# Patient Record
Sex: Female | Born: 1945 | Race: Black or African American | Hispanic: No | Marital: Single | Smoking: Former smoker
Health system: Southern US, Community
[De-identification: ages and names within clinical notes are randomized; demographics above are authoritative.]

## PROBLEM LIST (undated history)

## (undated) DIAGNOSIS — R0600 Dyspnea, unspecified: Secondary | ICD-10-CM

## (undated) DIAGNOSIS — M1990 Unspecified osteoarthritis, unspecified site: Secondary | ICD-10-CM

## (undated) DIAGNOSIS — I10 Essential (primary) hypertension: Secondary | ICD-10-CM

## (undated) DIAGNOSIS — F329 Major depressive disorder, single episode, unspecified: Secondary | ICD-10-CM

## (undated) DIAGNOSIS — M4310 Spondylolisthesis, site unspecified: Secondary | ICD-10-CM

## (undated) DIAGNOSIS — E7800 Pure hypercholesterolemia, unspecified: Secondary | ICD-10-CM

## (undated) DIAGNOSIS — C50919 Malignant neoplasm of unspecified site of unspecified female breast: Secondary | ICD-10-CM

## (undated) DIAGNOSIS — R55 Syncope and collapse: Secondary | ICD-10-CM

## (undated) DIAGNOSIS — I480 Paroxysmal atrial fibrillation: Secondary | ICD-10-CM

## (undated) DIAGNOSIS — K449 Diaphragmatic hernia without obstruction or gangrene: Secondary | ICD-10-CM

## (undated) DIAGNOSIS — R51 Headache: Secondary | ICD-10-CM

## (undated) DIAGNOSIS — N189 Chronic kidney disease, unspecified: Secondary | ICD-10-CM

## (undated) DIAGNOSIS — K209 Esophagitis, unspecified without bleeding: Secondary | ICD-10-CM

## (undated) DIAGNOSIS — K5730 Diverticulosis of large intestine without perforation or abscess without bleeding: Secondary | ICD-10-CM

## (undated) DIAGNOSIS — I499 Cardiac arrhythmia, unspecified: Secondary | ICD-10-CM

## (undated) DIAGNOSIS — R519 Headache, unspecified: Secondary | ICD-10-CM

## (undated) DIAGNOSIS — F32A Depression, unspecified: Secondary | ICD-10-CM

## (undated) DIAGNOSIS — I219 Acute myocardial infarction, unspecified: Secondary | ICD-10-CM

## (undated) DIAGNOSIS — E669 Obesity, unspecified: Secondary | ICD-10-CM

## (undated) DIAGNOSIS — K219 Gastro-esophageal reflux disease without esophagitis: Secondary | ICD-10-CM

## (undated) DIAGNOSIS — G4733 Obstructive sleep apnea (adult) (pediatric): Secondary | ICD-10-CM

## (undated) DIAGNOSIS — D649 Anemia, unspecified: Secondary | ICD-10-CM

## (undated) DIAGNOSIS — K625 Hemorrhage of anus and rectum: Secondary | ICD-10-CM

## (undated) DIAGNOSIS — M19049 Primary osteoarthritis, unspecified hand: Secondary | ICD-10-CM

## (undated) DIAGNOSIS — K648 Other hemorrhoids: Secondary | ICD-10-CM

## (undated) DIAGNOSIS — D126 Benign neoplasm of colon, unspecified: Secondary | ICD-10-CM

## (undated) DIAGNOSIS — M47816 Spondylosis without myelopathy or radiculopathy, lumbar region: Secondary | ICD-10-CM

## (undated) HISTORY — DX: Spondylolisthesis, site unspecified: M43.10

## (undated) HISTORY — PX: TONSILLECTOMY: SUR1361

## (undated) HISTORY — PX: DILATION AND CURETTAGE OF UTERUS: SHX78

## (undated) HISTORY — PX: TOE SURGERY: SHX1073

## (undated) HISTORY — PX: COLONOSCOPY: SHX174

## (undated) HISTORY — PX: LOOP RECORDER IMPLANT: SHX5954

## (undated) HISTORY — DX: Esophagitis, unspecified without bleeding: K20.90

## (undated) HISTORY — DX: Unspecified osteoarthritis, unspecified site: M19.90

## (undated) HISTORY — DX: Pure hypercholesterolemia, unspecified: E78.00

## (undated) HISTORY — DX: Anemia, unspecified: D64.9

## (undated) HISTORY — DX: Benign neoplasm of colon, unspecified: D12.6

## (undated) HISTORY — DX: Other hemorrhoids: K64.8

## (undated) HISTORY — DX: Major depressive disorder, single episode, unspecified: F32.9

## (undated) HISTORY — DX: Syncope and collapse: R55

## (undated) HISTORY — DX: Diverticulosis of large intestine without perforation or abscess without bleeding: K57.30

## (undated) HISTORY — DX: Essential (primary) hypertension: I10

## (undated) HISTORY — DX: Obstructive sleep apnea (adult) (pediatric): G47.33

## (undated) HISTORY — DX: Malignant neoplasm of unspecified site of unspecified female breast: C50.919

## (undated) HISTORY — DX: Spondylosis without myelopathy or radiculopathy, lumbar region: M47.816

## (undated) HISTORY — PX: JOINT REPLACEMENT: SHX530

## (undated) HISTORY — DX: Paroxysmal atrial fibrillation: I48.0

## (undated) HISTORY — DX: Obesity, unspecified: E66.9

## (undated) HISTORY — DX: Primary osteoarthritis, unspecified hand: M19.049

## (undated) HISTORY — PX: BACK SURGERY: SHX140

## (undated) HISTORY — DX: Hemorrhage of anus and rectum: K62.5

## (undated) HISTORY — DX: Depression, unspecified: F32.A

## (undated) HISTORY — PX: CHOLECYSTECTOMY: SHX55

## (undated) HISTORY — PX: FOOT SURGERY: SHX648

## (undated) HISTORY — DX: Esophagitis, unspecified: K20.9

---

## 1976-01-19 HISTORY — PX: ABDOMINAL HYSTERECTOMY: SHX81

## 1987-01-19 DIAGNOSIS — C50919 Malignant neoplasm of unspecified site of unspecified female breast: Secondary | ICD-10-CM

## 1987-01-19 HISTORY — DX: Malignant neoplasm of unspecified site of unspecified female breast: C50.919

## 1993-01-18 HISTORY — PX: BREAST BIOPSY: SHX20

## 1995-01-19 HISTORY — PX: BREAST EXCISIONAL BIOPSY: SUR124

## 1997-05-01 ENCOUNTER — Ambulatory Visit (HOSPITAL_COMMUNITY): Admission: RE | Admit: 1997-05-01 | Discharge: 1997-05-01 | Payer: Self-pay | Admitting: Cardiology

## 1997-05-26 ENCOUNTER — Inpatient Hospital Stay (HOSPITAL_COMMUNITY): Admission: EM | Admit: 1997-05-26 | Discharge: 1997-05-28 | Payer: Self-pay | Admitting: *Deleted

## 1997-06-26 ENCOUNTER — Other Ambulatory Visit: Admission: RE | Admit: 1997-06-26 | Discharge: 1997-06-26 | Payer: Self-pay | Admitting: Obstetrics

## 1998-02-08 ENCOUNTER — Encounter: Payer: Self-pay | Admitting: Emergency Medicine

## 1998-02-08 ENCOUNTER — Inpatient Hospital Stay (HOSPITAL_COMMUNITY): Admission: EM | Admit: 1998-02-08 | Discharge: 1998-02-14 | Payer: Self-pay | Admitting: Emergency Medicine

## 1998-02-12 ENCOUNTER — Encounter: Payer: Self-pay | Admitting: Cardiology

## 1998-12-16 ENCOUNTER — Encounter: Payer: Self-pay | Admitting: Cardiology

## 1998-12-16 ENCOUNTER — Ambulatory Visit (HOSPITAL_COMMUNITY): Admission: RE | Admit: 1998-12-16 | Discharge: 1998-12-16 | Payer: Self-pay | Admitting: Cardiology

## 1999-04-29 ENCOUNTER — Encounter: Admission: RE | Admit: 1999-04-29 | Discharge: 1999-04-29 | Payer: Self-pay | Admitting: Cardiology

## 1999-04-29 ENCOUNTER — Encounter: Payer: Self-pay | Admitting: Cardiology

## 1999-10-05 ENCOUNTER — Encounter: Payer: Self-pay | Admitting: Cardiology

## 1999-10-05 ENCOUNTER — Encounter: Admission: RE | Admit: 1999-10-05 | Discharge: 1999-10-05 | Payer: Self-pay | Admitting: Cardiology

## 2000-01-19 HISTORY — PX: KNEE ARTHROSCOPY: SUR90

## 2000-03-23 ENCOUNTER — Ambulatory Visit (HOSPITAL_COMMUNITY): Admission: RE | Admit: 2000-03-23 | Discharge: 2000-03-23 | Payer: Self-pay | Admitting: Orthopaedic Surgery

## 2000-04-08 ENCOUNTER — Ambulatory Visit (HOSPITAL_COMMUNITY): Admission: RE | Admit: 2000-04-08 | Discharge: 2000-04-08 | Payer: Self-pay | Admitting: Orthopaedic Surgery

## 2000-04-22 ENCOUNTER — Encounter: Admission: RE | Admit: 2000-04-22 | Discharge: 2000-04-22 | Payer: Self-pay | Admitting: Orthopaedic Surgery

## 2000-10-11 ENCOUNTER — Encounter: Admission: RE | Admit: 2000-10-11 | Discharge: 2000-10-11 | Payer: Self-pay | Admitting: Cardiology

## 2000-10-11 ENCOUNTER — Encounter: Payer: Self-pay | Admitting: Cardiology

## 2000-10-27 ENCOUNTER — Other Ambulatory Visit: Admission: RE | Admit: 2000-10-27 | Discharge: 2000-10-27 | Payer: Self-pay | Admitting: Obstetrics and Gynecology

## 2000-11-14 ENCOUNTER — Inpatient Hospital Stay (HOSPITAL_COMMUNITY): Admission: RE | Admit: 2000-11-14 | Discharge: 2000-11-17 | Payer: Self-pay | Admitting: Orthopaedic Surgery

## 2000-11-17 ENCOUNTER — Inpatient Hospital Stay (HOSPITAL_COMMUNITY)
Admission: RE | Admit: 2000-11-17 | Discharge: 2000-11-23 | Payer: Self-pay | Admitting: Physical Medicine & Rehabilitation

## 2001-02-13 ENCOUNTER — Encounter: Admission: RE | Admit: 2001-02-13 | Discharge: 2001-05-14 | Payer: Self-pay | Admitting: Orthopaedic Surgery

## 2001-05-15 ENCOUNTER — Encounter: Admission: RE | Admit: 2001-05-15 | Discharge: 2001-08-13 | Payer: Self-pay | Admitting: Orthopaedic Surgery

## 2001-05-30 ENCOUNTER — Ambulatory Visit (HOSPITAL_BASED_OUTPATIENT_CLINIC_OR_DEPARTMENT_OTHER): Admission: RE | Admit: 2001-05-30 | Discharge: 2001-05-30 | Payer: Self-pay | Admitting: Urology

## 2001-10-26 ENCOUNTER — Encounter: Admission: RE | Admit: 2001-10-26 | Discharge: 2001-10-26 | Payer: Self-pay | Admitting: Cardiology

## 2001-10-26 ENCOUNTER — Encounter: Payer: Self-pay | Admitting: Cardiology

## 2001-11-03 ENCOUNTER — Encounter: Payer: Self-pay | Admitting: Cardiology

## 2001-11-03 ENCOUNTER — Encounter: Admission: RE | Admit: 2001-11-03 | Discharge: 2001-11-03 | Payer: Self-pay | Admitting: Cardiology

## 2002-11-06 ENCOUNTER — Encounter: Payer: Self-pay | Admitting: Cardiology

## 2002-11-06 ENCOUNTER — Encounter: Admission: RE | Admit: 2002-11-06 | Discharge: 2002-11-06 | Payer: Self-pay | Admitting: Cardiology

## 2003-05-31 ENCOUNTER — Ambulatory Visit (HOSPITAL_COMMUNITY): Admission: RE | Admit: 2003-05-31 | Discharge: 2003-05-31 | Payer: Self-pay | Admitting: Orthopaedic Surgery

## 2003-06-04 ENCOUNTER — Emergency Department (HOSPITAL_COMMUNITY): Admission: EM | Admit: 2003-06-04 | Discharge: 2003-06-05 | Payer: Self-pay | Admitting: *Deleted

## 2003-10-08 ENCOUNTER — Encounter (INDEPENDENT_AMBULATORY_CARE_PROVIDER_SITE_OTHER): Payer: Self-pay | Admitting: *Deleted

## 2003-10-08 ENCOUNTER — Ambulatory Visit (HOSPITAL_COMMUNITY): Admission: RE | Admit: 2003-10-08 | Discharge: 2003-10-08 | Payer: Self-pay | Admitting: Gastroenterology

## 2003-11-18 ENCOUNTER — Encounter: Admission: RE | Admit: 2003-11-18 | Discharge: 2003-11-18 | Payer: Self-pay | Admitting: Cardiology

## 2004-03-04 ENCOUNTER — Encounter (INDEPENDENT_AMBULATORY_CARE_PROVIDER_SITE_OTHER): Payer: Self-pay | Admitting: Specialist

## 2004-03-04 ENCOUNTER — Ambulatory Visit (HOSPITAL_COMMUNITY): Admission: RE | Admit: 2004-03-04 | Discharge: 2004-03-04 | Payer: Self-pay | Admitting: Gastroenterology

## 2004-05-02 ENCOUNTER — Emergency Department (HOSPITAL_COMMUNITY): Admission: EM | Admit: 2004-05-02 | Discharge: 2004-05-02 | Payer: Self-pay | Admitting: Family Medicine

## 2004-05-19 ENCOUNTER — Ambulatory Visit (HOSPITAL_COMMUNITY): Admission: RE | Admit: 2004-05-19 | Discharge: 2004-05-19 | Payer: Self-pay | Admitting: Cardiology

## 2004-07-16 ENCOUNTER — Ambulatory Visit (HOSPITAL_COMMUNITY): Admission: RE | Admit: 2004-07-16 | Discharge: 2004-07-16 | Payer: Self-pay | Admitting: General Surgery

## 2004-07-16 ENCOUNTER — Encounter (INDEPENDENT_AMBULATORY_CARE_PROVIDER_SITE_OTHER): Payer: Self-pay | Admitting: *Deleted

## 2004-10-29 ENCOUNTER — Encounter: Admission: RE | Admit: 2004-10-29 | Discharge: 2004-10-29 | Payer: Self-pay | Admitting: Otolaryngology

## 2004-12-09 ENCOUNTER — Encounter: Admission: RE | Admit: 2004-12-09 | Discharge: 2004-12-09 | Payer: Self-pay | Admitting: Cardiology

## 2005-01-14 ENCOUNTER — Encounter: Admission: RE | Admit: 2005-01-14 | Discharge: 2005-01-14 | Payer: Self-pay | Admitting: Cardiology

## 2005-10-13 ENCOUNTER — Inpatient Hospital Stay (HOSPITAL_COMMUNITY): Admission: EM | Admit: 2005-10-13 | Discharge: 2005-10-17 | Payer: Self-pay | Admitting: Emergency Medicine

## 2005-12-13 ENCOUNTER — Encounter: Admission: RE | Admit: 2005-12-13 | Discharge: 2005-12-13 | Payer: Self-pay | Admitting: Cardiology

## 2006-02-13 ENCOUNTER — Inpatient Hospital Stay (HOSPITAL_COMMUNITY): Admission: EM | Admit: 2006-02-13 | Discharge: 2006-02-16 | Payer: Self-pay | Admitting: Emergency Medicine

## 2006-06-19 ENCOUNTER — Inpatient Hospital Stay (HOSPITAL_COMMUNITY): Admission: EM | Admit: 2006-06-19 | Discharge: 2006-06-22 | Payer: Self-pay | Admitting: Emergency Medicine

## 2006-08-03 ENCOUNTER — Emergency Department (HOSPITAL_COMMUNITY): Admission: EM | Admit: 2006-08-03 | Discharge: 2006-08-03 | Payer: Self-pay | Admitting: Emergency Medicine

## 2006-09-12 ENCOUNTER — Ambulatory Visit (HOSPITAL_COMMUNITY): Admission: RE | Admit: 2006-09-12 | Discharge: 2006-09-12 | Payer: Self-pay | Admitting: Cardiology

## 2006-11-13 ENCOUNTER — Ambulatory Visit: Payer: Self-pay | Admitting: Surgery

## 2006-11-13 ENCOUNTER — Observation Stay (HOSPITAL_COMMUNITY): Admission: EM | Admit: 2006-11-13 | Discharge: 2006-11-17 | Payer: Self-pay | Admitting: Emergency Medicine

## 2006-11-13 ENCOUNTER — Ambulatory Visit: Payer: Self-pay | Admitting: Internal Medicine

## 2006-11-16 ENCOUNTER — Encounter (INDEPENDENT_AMBULATORY_CARE_PROVIDER_SITE_OTHER): Payer: Self-pay | Admitting: Cardiology

## 2006-11-28 ENCOUNTER — Ambulatory Visit: Payer: Self-pay

## 2006-12-20 ENCOUNTER — Encounter: Admission: RE | Admit: 2006-12-20 | Discharge: 2006-12-20 | Payer: Self-pay | Admitting: Cardiology

## 2007-02-13 ENCOUNTER — Inpatient Hospital Stay (HOSPITAL_COMMUNITY): Admission: EM | Admit: 2007-02-13 | Discharge: 2007-02-18 | Payer: Self-pay | Admitting: Emergency Medicine

## 2007-02-13 ENCOUNTER — Ambulatory Visit: Payer: Self-pay | Admitting: Internal Medicine

## 2007-02-15 HISTORY — PX: ESOPHAGOGASTRODUODENOSCOPY: SHX1529

## 2007-03-06 ENCOUNTER — Encounter: Admission: RE | Admit: 2007-03-06 | Discharge: 2007-03-30 | Payer: Self-pay | Admitting: Cardiology

## 2007-03-10 ENCOUNTER — Encounter: Admission: RE | Admit: 2007-03-10 | Discharge: 2007-03-10 | Payer: Self-pay | Admitting: Gastroenterology

## 2007-03-14 ENCOUNTER — Ambulatory Visit: Payer: Self-pay | Admitting: Internal Medicine

## 2007-08-11 ENCOUNTER — Ambulatory Visit (HOSPITAL_COMMUNITY): Admission: RE | Admit: 2007-08-11 | Discharge: 2007-08-11 | Payer: Self-pay | Admitting: Orthopaedic Surgery

## 2007-09-12 ENCOUNTER — Ambulatory Visit: Payer: Self-pay | Admitting: Internal Medicine

## 2007-12-11 ENCOUNTER — Ambulatory Visit: Payer: Self-pay

## 2007-12-22 ENCOUNTER — Encounter: Admission: RE | Admit: 2007-12-22 | Discharge: 2007-12-22 | Payer: Self-pay | Admitting: Cardiology

## 2008-03-15 ENCOUNTER — Observation Stay (HOSPITAL_COMMUNITY): Admission: EM | Admit: 2008-03-15 | Discharge: 2008-03-16 | Payer: Self-pay | Admitting: Emergency Medicine

## 2008-05-16 ENCOUNTER — Encounter (HOSPITAL_COMMUNITY): Admission: RE | Admit: 2008-05-16 | Discharge: 2008-08-14 | Payer: Self-pay | Admitting: Cardiology

## 2008-06-18 ENCOUNTER — Emergency Department (HOSPITAL_COMMUNITY): Admission: EM | Admit: 2008-06-18 | Discharge: 2008-06-18 | Payer: Self-pay | Admitting: Emergency Medicine

## 2008-08-02 ENCOUNTER — Encounter (HOSPITAL_COMMUNITY): Admission: RE | Admit: 2008-08-02 | Discharge: 2008-09-19 | Payer: Self-pay | Admitting: Cardiology

## 2008-11-12 ENCOUNTER — Ambulatory Visit: Payer: Self-pay | Admitting: Internal Medicine

## 2008-11-12 DIAGNOSIS — R55 Syncope and collapse: Secondary | ICD-10-CM | POA: Insufficient documentation

## 2008-11-12 DIAGNOSIS — I10 Essential (primary) hypertension: Secondary | ICD-10-CM

## 2008-11-12 DIAGNOSIS — E785 Hyperlipidemia, unspecified: Secondary | ICD-10-CM

## 2008-12-07 ENCOUNTER — Observation Stay (HOSPITAL_COMMUNITY): Admission: EM | Admit: 2008-12-07 | Discharge: 2008-12-09 | Payer: Self-pay | Admitting: Emergency Medicine

## 2008-12-23 ENCOUNTER — Telehealth: Payer: Self-pay | Admitting: Internal Medicine

## 2008-12-23 ENCOUNTER — Encounter: Payer: Self-pay | Admitting: Internal Medicine

## 2008-12-23 ENCOUNTER — Encounter: Admission: RE | Admit: 2008-12-23 | Discharge: 2008-12-23 | Payer: Self-pay | Admitting: Cardiology

## 2009-01-18 HISTORY — PX: HEMORRHOID SURGERY: SHX153

## 2009-01-30 ENCOUNTER — Ambulatory Visit: Payer: Self-pay

## 2009-04-14 ENCOUNTER — Emergency Department (HOSPITAL_COMMUNITY): Admission: EM | Admit: 2009-04-14 | Discharge: 2009-04-14 | Payer: Self-pay | Admitting: Emergency Medicine

## 2009-04-30 ENCOUNTER — Encounter: Payer: Self-pay | Admitting: Internal Medicine

## 2009-04-30 ENCOUNTER — Ambulatory Visit: Payer: Self-pay | Admitting: Internal Medicine

## 2009-05-07 ENCOUNTER — Encounter: Payer: Self-pay | Admitting: Internal Medicine

## 2009-07-08 ENCOUNTER — Ambulatory Visit (HOSPITAL_COMMUNITY): Admission: RE | Admit: 2009-07-08 | Discharge: 2009-07-09 | Payer: Self-pay | Admitting: General Surgery

## 2009-07-08 ENCOUNTER — Encounter (INDEPENDENT_AMBULATORY_CARE_PROVIDER_SITE_OTHER): Payer: Self-pay | Admitting: General Surgery

## 2009-08-19 ENCOUNTER — Ambulatory Visit: Payer: Self-pay | Admitting: Internal Medicine

## 2009-09-29 ENCOUNTER — Encounter: Admission: RE | Admit: 2009-09-29 | Discharge: 2009-09-29 | Payer: Self-pay | Admitting: Neurology

## 2009-10-30 ENCOUNTER — Ambulatory Visit: Payer: Self-pay | Admitting: Internal Medicine

## 2009-11-09 DIAGNOSIS — D126 Benign neoplasm of colon, unspecified: Secondary | ICD-10-CM

## 2009-11-09 HISTORY — PX: COLONOSCOPY W/ POLYPECTOMY: SHX1380

## 2009-11-09 HISTORY — DX: Benign neoplasm of colon, unspecified: D12.6

## 2010-01-15 ENCOUNTER — Encounter
Admission: RE | Admit: 2010-01-15 | Discharge: 2010-01-15 | Payer: Self-pay | Source: Home / Self Care | Attending: Cardiology | Admitting: Cardiology

## 2010-02-03 ENCOUNTER — Ambulatory Visit
Admission: RE | Admit: 2010-02-03 | Discharge: 2010-02-03 | Payer: Self-pay | Source: Home / Self Care | Attending: Internal Medicine | Admitting: Internal Medicine

## 2010-02-08 ENCOUNTER — Encounter: Payer: Self-pay | Admitting: Neurology

## 2010-02-08 ENCOUNTER — Encounter: Payer: Self-pay | Admitting: Internal Medicine

## 2010-02-09 ENCOUNTER — Encounter: Payer: Self-pay | Admitting: Cardiology

## 2010-02-17 NOTE — Cardiovascular Report (Signed)
Summary: Office Visit Remote  Office Visit Remote   Imported By: Roderic Ovens 05/07/2009 14:20:28  _____________________________________________________________________  External Attachment:    Type:   Image     Comment:   External Document

## 2010-02-17 NOTE — Letter (Signed)
Summary: Remote Device Check  [employer], Main Office  [address]   [place], [place] [address]   Phone: [phone]  Fax: [phone]     May 07, 2009 MRN: [id number]   [facility] [address] [place], [place]  [address]   Dear Ms. Kotlyar,   Your remote transmission was recieved and reviewed by your physician.  All diagnostics were within normal limits for you.    ___X___Your next office visit is scheduled for:   JULY 2011 WITH DR Ladona Ridgel. Please call our office to schedule an appointment.    Sincerely,  Proofreader

## 2010-02-17 NOTE — Assessment & Plan Note (Signed)
Summary: loop/medtronic/amber   Visit Type:  Follow-up Primary Provider:  Elizebeth Koller, MD   History of Present Illness: [name] returns today for followup.  She is a pleasant 65 yo woman with a h/o unexplained syncope.  She denies c/p or sob.  She does admit to sodium indiscretion and peripheral edema.  She has had no frank syncope since her ILR was placed.  She does note some palpitations when she lays down to go to sleep at night.  She has lost almost 40 lbs.  Current Medications (verified): 1)  Onglyza 5 Mg Tabs (Saxagliptin Hcl) .... Take One Tablet By Mouth Once Daily. 2)  Plavix 75 Mg Tabs (Clopidogrel Bisulfate) .... Take One Tablet By Mouth Daily 3)  Lipitor 10 Mg Tabs (Atorvastatin Calcium) .... Take One Tablet By Mouth Daily. 4)  Metoclopramide Hcl 10 Mg Tabs (Metoclopramide Hcl) .... Uad 5)  Prilosec 40 Mg Cpdr (Omeprazole) .... Take One Tablet By Mouth Twice Daily. 6)  Carvedilol 12.5 Mg Tabs (Carvedilol) .... Take One Tablet By Mouth Twice A Day 7)  Metformin Hcl 1000 Mg Tabs (Metformin Hcl) .... Take One Tablet By Mouth Twice Daily. 8)  Xopenex Hfa 45 Mcg/act Aero (Levalbuterol Tartrate) .... Uad 9)  Ipratropium Bromide 0.02 % Soln (Ipratropium Bromide) .... Uad 10)  Pulmicort 0.5 Mg/7ml Susp (Budesonide) .... Uad 11)  Amitriptyline Hcl 50 Mg Tabs (Amitriptyline Hcl) .... Take One Tablet By Mouth Once Daily. 12)  Topamax 100 Mg Tabs (Topiramate) .... Take One Tablet By Mouth Once Daily. 13)  Fosamax 70 Mg Tabs (Alendronate Sodium) .... Weekly 14)  Promethazine Hcl 25 Mg Tabs (Promethazine Hcl) .... As Needed 15)  Vicodin Es 7.5-750 Mg Tabs (Hydrocodone-Acetaminophen) .... As Needed 16)  Losartan Potassium 100 Mg Tabs (Losartan Potassium) .... Once Daily 17)  Isosorbide Mononitrate Cr 60 Mg Xr24h-Tab (Isosorbide Mononitrate) .... Take One Tablet By Mouth Daily 18)  Glimepiride 2 Mg Tabs (Glimepiride) .... Two Times A Day 19)  Zolpidem Tartrate 10 Mg Tabs (Zolpidem Tartrate)  .... Once Daily  Allergies (verified): 1)  ! * Ivp 2)  ! * Iodine Containing 3)  ! Jonne Ply  Past History:  Past Medical History: Last updated: 11/05/2008 Arthritis G E R D Asthma hypercholesterolemia  GERD Myocardial Infarction  Past Surgical History: s/p ILR  Review of Systems  The patient denies chest pain, syncope, dyspnea on exertion, and peripheral edema.    Vital Signs:  Patient profile:   65 year old female Height:      65 inches Weight:      219 pounds BMI:     36.58 Pulse rate:   85 / minute BP sitting:   110 / 70  (left arm)  Vitals Entered By: Laurance Flatten CMA (August 19, 2009 3:26 PM)  Physical Exam  General:  Well developed, well nourished, in no acute distress.  HEENT: normal Neck: supple. No JVD. Carotids 2+ bilaterally no bruits Cor: RRR no rubs, gallops or murmur Lungs: CTA BJ:SEGBT, soft, nontender. nondistended. No HSM. Good bowel sounds Ext: warm. no cyanosis, clubbing or edema Neuro: alert and oriented. Grossly nonfocal. affect pleasant     ILR Following MD Lewayne Bunting, MD DOI:  11/15/2006 Vendor:  Medtronic     Model Number:  9528     Serial Number [id number]       Tachy Episodes:  1     Brady Episodes:  29 ILR Next Due 11/18/2009  Tech Comments:  All episodes noise and undersensing.  Ms.  Saulnier has been asymptomatic.  Her last syncopal episode was in January. ROV 3 months clinic. Altha Harm, LPN  August 19, 2009 3:31 PM   MD Comments:  Agree with above.  Impression & Recommendations:  Problem # 1:  SYNCOPE (ICD-780.2) She remains stable and her ILR demonstrates no arrhythmias.  Will recheck in several months. The following medications were removed from the medication list:    Diltiazem Hcl Er Beads 240 Mg Xr24h-cap (Diltiazem hcl er beads) .Marland Kitchen... Take one capsule by mouth daily Her updated medication list for this problem includes:    Plavix 75 Mg Tabs (Clopidogrel bisulfate) .Marland Kitchen... Take one tablet by mouth daily     Carvedilol 12.5 Mg Tabs (Carvedilol) .Marland Kitchen... Take one tablet by mouth twice a day    Isosorbide Mononitrate Cr 60 Mg Xr24h-tab (Isosorbide mononitrate) .Marland Kitchen... Take one tablet by mouth daily  Problem # 2:  ESSENTIAL HYPERTENSION, BENIGN (ICD-401.1) Her pressure has improved in the setting of her weight loss and improved diet. The following medications were removed from the medication list:    Diltiazem Hcl Er Beads 240 Mg Xr24h-cap (Diltiazem hcl er beads) .Marland Kitchen... Take one capsule by mouth daily Her updated medication list for this problem includes:    Carvedilol 12.5 Mg Tabs (Carvedilol) .Marland Kitchen... Take one tablet by mouth twice a day    Losartan Potassium 100 Mg Tabs (Losartan potassium) ..... Once daily  Patient Instructions: 1)  Your physician recommends that you schedule a follow-up appointment in: 3 months with device clnic and 6 months with Dr Ladona Ridgel

## 2010-02-17 NOTE — Procedures (Signed)
Summary: device check   Current Medications (verified): 1)  Onglyza 5 Mg Tabs (Saxagliptin Hcl) .... Take One Tablet By Mouth Once Daily. 2)  Plavix 75 Mg Tabs (Clopidogrel Bisulfate) .... Take One Tablet By Mouth Daily 3)  Lipitor 10 Mg Tabs (Atorvastatin Calcium) .... Take One Tablet By Mouth Daily. 4)  Metoclopramide Hcl 10 Mg Tabs (Metoclopramide Hcl) .... Uad 5)  Prilosec 40 Mg Cpdr (Omeprazole) .... Take One Tablet By Mouth Twice Daily. 6)  Carvedilol 12.5 Mg Tabs (Carvedilol) .... Take One Tablet By Mouth Twice A Day 7)  Metformin Hcl 1000 Mg Tabs (Metformin Hcl) .... Take One Tablet By Mouth Twice Daily. 8)  Xopenex Hfa 45 Mcg/act Aero (Levalbuterol Tartrate) .... Uad 9)  Ipratropium Bromide 0.02 % Soln (Ipratropium Bromide) .... Uad 10)  Pulmicort 0.5 Mg/49ml Susp (Budesonide) .... Uad 11)  Amitriptyline Hcl 50 Mg Tabs (Amitriptyline Hcl) .... Take One Tablet By Mouth Once Daily. 12)  Topamax 100 Mg Tabs (Topiramate) .... Take One Tablet By Mouth Once Daily. 13)  Fosamax 70 Mg Tabs (Alendronate Sodium) .... Weekly 14)  Promethazine Hcl 25 Mg Tabs (Promethazine Hcl) .... As Needed 15)  Vicodin Es 7.5-750 Mg Tabs (Hydrocodone-Acetaminophen) .... As Needed 16)  Losartan Potassium 100 Mg Tabs (Losartan Potassium) .... Once Daily 17)  Isosorbide Mononitrate Cr 60 Mg Xr24h-Tab (Isosorbide Mononitrate) .... Take One Tablet By Mouth Daily 18)  Glimepiride 2 Mg Tabs (Glimepiride) .... Two Times A Day 19)  Zolpidem Tartrate 10 Mg Tabs (Zolpidem Tartrate) .... Once Daily  Allergies (verified): 1)  ! * Ivp 2)  ! * Iodine Containing 3)  ! Jonne Ply   ILR Following MD Lewayne Bunting, MD DOI:  11/15/2006 Vendor:  Medtronic     Model Number:  1610     Serial Number [id number]       Tachy Episodes:  2     Brady Episodes:  929 ILR Next Due 01/19/2010  Tech Comments:  PT HAD 1 SYMPTOM EPISODE--NORMAL.  929 BRADY/ASYSTOLE EPISODES--EGMs PRINTED OUT.  NOISE.  ROV IN 3 MTHS W/GT. Vella Kohler   October 31, 2009 11:37 AM

## 2010-02-17 NOTE — Procedures (Signed)
Summary: DEVICE/SAF   Allergies: 1)  ! * Ivp 2)  ! * Iodine Containing 3)  ! Jonne Ply    ILR Following MD Lewayne Bunting, MD DOI:  11/15/2006 Vendor:  Medtronic     Model Number:  9528     Serial Number [id number]       Tachy Episodes:  1     Brady Episodes:  1064 ILR Next Due 04/18/2009  Tech Comments:  Normal device function.  Tachy episode is noise, brady episodes are undersensing.  Pt will send [facility] transmission 04-30-09 and follow up with Dr Ladona Ridgel in July. Gypsy Balsam RN BSN  January 30, 2009 1:48 PM   MD Comments:  Agree with above.

## 2010-02-19 NOTE — Assessment & Plan Note (Signed)
Summary: pc2   Visit Type:  Follow-up Primary Provider:  Elizebeth Koller, MD   History of Present Illness: [name] returns today for followup.  She is a pleasant 65 yo woman with a h/o unexplained syncope.  She denies c/p or sob.  She denies sodium indiscretion and peripheral edema.  She has had no frank syncope since her ILR was placed.  She does note some palpitations when she lays down to go to sleep at night.  She has lost almost 40 lbs. No other complaints today.  Current Medications (verified): 1)  Onglyza 5 Mg Tabs (Saxagliptin Hcl) .... Take One Tablet By Mouth Once Daily. 2)  Plavix 75 Mg Tabs (Clopidogrel Bisulfate) .... Take One Tablet By Mouth Daily 3)  Lipitor 10 Mg Tabs (Atorvastatin Calcium) .... Take One Tablet By Mouth Daily. 4)  Metoclopramide Hcl 10 Mg Tabs (Metoclopramide Hcl) .... Uad 5)  Prilosec 40 Mg Cpdr (Omeprazole) .... Take One Tablet By Mouth Twice Daily. 6)  Carvedilol 12.5 Mg Tabs (Carvedilol) .... Take One Tablet By Mouth Twice A Day 7)  Metformin Hcl 1000 Mg Tabs (Metformin Hcl) .... Take One Tablet By Mouth Twice Daily. 8)  Xopenex Hfa 45 Mcg/act Aero (Levalbuterol Tartrate) .... Uad 9)  Ipratropium Bromide 0.02 % Soln (Ipratropium Bromide) .... Uad 10)  Pulmicort 0.5 Mg/42ml Susp (Budesonide) .... Uad 11)  Amitriptyline Hcl 50 Mg Tabs (Amitriptyline Hcl) .... Take One Tablet By Mouth Once Daily. 12)  Topamax 100 Mg Tabs (Topiramate) .... Take One Tablet By Mouth Once Daily. 13)  Fosamax 70 Mg Tabs (Alendronate Sodium) .... Weekly 14)  Promethazine Hcl 25 Mg Tabs (Promethazine Hcl) .... As Needed 15)  Vicodin Es 7.5-750 Mg Tabs (Hydrocodone-Acetaminophen) .... As Needed 16)  Losartan Potassium 100 Mg Tabs (Losartan Potassium) .... Once Daily 17)  Isosorbide Mononitrate Cr 60 Mg Xr24h-Tab (Isosorbide Mononitrate) .... Take One Tablet By Mouth Daily 18)  Glimepiride 2 Mg Tabs (Glimepiride) .... Two Times A Day 19)  Zolpidem Tartrate 10 Mg Tabs (Zolpidem Tartrate)  .... Once Daily 20)  Pataday 0.2 % Soln (Olopatadine Hcl) .... Uad 21)  Restasis 0.05 % Emul (Cyclosporine) .... Uad  Allergies: 1)  ! * Ivp 2)  ! * Iodine Containing 3)  ! Jonne Ply  Past History:  Past Medical History: Last updated: 11/05/2008 Arthritis G E R D Asthma hypercholesterolemia  GERD Myocardial Infarction  Past Surgical History: Last updated: 08/19/2009 s/p ILR  Review of Systems  The patient denies chest pain, syncope, dyspnea on exertion, and peripheral edema.    Vital Signs:  Patient profile:   65 year old female Height:      65 inches Weight:      216 pounds BMI:     36.07 Pulse rate:   92 / minute BP sitting:   102 / 62  (left arm)  Vitals Entered By: Laurance Flatten CMA (February 03, 2010 2:28 PM)  Physical Exam  General:  Well developed, well nourished, in no acute distress.  HEENT: normal Neck: supple. No JVD. Carotids 2+ bilaterally no bruits Cor: RRR no rubs, gallops or murmur Lungs: CTA ZO:XWRUE, soft, nontender. nondistended. No HSM. Good bowel sounds Ext: warm. no cyanosis, clubbing or edema Neuro: alert and oriented. Grossly nonfocal. affect pleasant     ILR Following MD Lewayne Bunting, MD DOI:  11/15/2006 Vendor:  Medtronic     Model Number:  9528     Serial Number [id number]       Tachy Episodes:  0  Brady Episodes:  42  Tech Comments:  DEVICE REACHED RRT ON 12-16-09.  42 ASYSTOLE/BRADY EPISODES--UNDERSENSING.  NO CHANGES MADE. PLAN PER GT. Vella Kohler  February 03, 2010 2:39 PM  MD Comments:  Agree with above.  Impression & Recommendations:  Problem # 1:  SYNCOPE (ICD-780.2)  She has no recurrent symptoms. Continue meds as below. Her updated medication list for this problem includes:    Plavix 75 Mg Tabs (Clopidogrel bisulfate) .Marland Kitchen... Take one tablet by mouth daily    Carvedilol 12.5 Mg Tabs (Carvedilol) .Marland Kitchen... Take one tablet by mouth twice a day    Isosorbide Mononitrate Cr 60 Mg Xr24h-tab (Isosorbide mononitrate) .Marland Kitchen...  Take one tablet by mouth daily  Her updated medication list for this problem includes:    Plavix 75 Mg Tabs (Clopidogrel bisulfate) .Marland Kitchen... Take one tablet by mouth daily    Carvedilol 12.5 Mg Tabs (Carvedilol) .Marland Kitchen... Take one tablet by mouth twice a day    Isosorbide Mononitrate Cr 60 Mg Xr24h-tab (Isosorbide mononitrate) .Marland Kitchen... Take one tablet by mouth daily  Problem # 2:  ESSENTIAL HYPERTENSION, BENIGN (ICD-401.1)  Her blood pressure is well controlled. She will continue her low sodium diet.  Her updated medication list for this problem includes:    Carvedilol 12.5 Mg Tabs (Carvedilol) .Marland Kitchen... Take one tablet by mouth twice a day    Losartan Potassium 100 Mg Tabs (Losartan potassium) ..... Once daily  Her updated medication list for this problem includes:    Carvedilol 12.5 Mg Tabs (Carvedilol) .Marland Kitchen... Take one tablet by mouth twice a day    Losartan Potassium 100 Mg Tabs (Losartan potassium) ..... Once daily  Problem # 3:  DYSLIPIDEMIA (ICD-272.4)  She is maintaining a low fat diet. She will continue lipitor. Her updated medication list for this problem includes:    Lipitor 10 Mg Tabs (Atorvastatin calcium) .Marland Kitchen... Take one tablet by mouth daily.  Her updated medication list for this problem includes:    Lipitor 10 Mg Tabs (Atorvastatin calcium) .Marland Kitchen... Take one tablet by mouth daily.  Patient Instructions: 1)  Your physician recommends that you continue on your current medications as directed. Please refer to the Current Medication list given to you today. 2)  Your physician wants you to follow-up in: 1 year.  You will receive a reminder letter in the mail two months in advance. If you don't receive a letter, please call our office to schedule the follow-up appointment.

## 2010-03-10 ENCOUNTER — Other Ambulatory Visit: Payer: Self-pay | Admitting: Neurosurgery

## 2010-03-10 DIAGNOSIS — M549 Dorsalgia, unspecified: Secondary | ICD-10-CM

## 2010-03-13 ENCOUNTER — Ambulatory Visit
Admission: RE | Admit: 2010-03-13 | Discharge: 2010-03-13 | Disposition: A | Discharge status: Home / Self Care | Payer: Medicare Other | Source: Ambulatory Visit | Attending: Neurosurgery | Admitting: Neurosurgery

## 2010-03-13 DIAGNOSIS — M549 Dorsalgia, unspecified: Secondary | ICD-10-CM

## 2010-03-20 ENCOUNTER — Other Ambulatory Visit: Payer: Self-pay | Admitting: Cardiology

## 2010-03-26 ENCOUNTER — Ambulatory Visit
Admission: RE | Admit: 2010-03-26 | Discharge: 2010-03-26 | Disposition: A | Discharge status: Home / Self Care | Payer: Medicare Other | Source: Ambulatory Visit | Attending: Cardiology | Admitting: Cardiology

## 2010-04-05 LAB — GLUCOSE, CAPILLARY: Glucose-Capillary: 115 mg/dL — ABNORMAL HIGH (ref 70–99)

## 2010-04-05 LAB — CBC
MCV: 85.8 fL (ref 78.0–100.0)
Platelets: 296 10*3/uL (ref 150–400)
WBC: 10 10*3/uL (ref 4.0–10.5)

## 2010-04-05 LAB — COMPREHENSIVE METABOLIC PANEL
ALT: 13 U/L (ref 0–35)
AST: 12 U/L (ref 0–37)
Albumin: 3.5 g/dL (ref 3.5–5.2)
Calcium: 9.6 mg/dL (ref 8.4–10.5)
Chloride: 111 mEq/L (ref 96–112)
Creatinine, Ser: 0.72 mg/dL (ref 0.4–1.2)
GFR calc Af Amer: >60 mL/min (ref >60)
Sodium: 142 mEq/L (ref 135–145)
Total Bilirubin: 0.3 mg/dL (ref 0.3–1.2)

## 2010-04-05 LAB — DIFFERENTIAL
Eosinophils Relative: 1 % (ref 0–5)
Lymphocytes Relative: 34 % (ref 12–46)
Lymphs Abs: 3.4 10*3/uL (ref 0.7–4.0)
Monocytes Absolute: 0.4 10*3/uL (ref 0.1–1.0)

## 2010-04-10 LAB — URINALYSIS, ROUTINE W REFLEX MICROSCOPIC
Glucose, UA: NEGATIVE mg/dL
Nitrite: NEGATIVE
Urobilinogen, UA: 1 mg/dL (ref 0.0–1.0)

## 2010-04-10 LAB — POCT I-STAT, CHEM 8
Calcium, Ion: 1.16 mmol/L (ref 1.12–1.32)
Chloride: 110 mEq/L (ref 96–112)
Glucose, Bld: 82 mg/dL (ref 70–99)
HCT: 35 % — ABNORMAL LOW (ref 36.0–46.0)
Hemoglobin: 11.9 g/dL — ABNORMAL LOW (ref 12.0–15.0)
Potassium: 3.7 mEq/L (ref 3.5–5.1)

## 2010-04-10 LAB — URINE MICROSCOPIC-ADD ON

## 2010-04-10 LAB — CBC
MCHC: 32.1 g/dL (ref 30.0–36.0)
MCV: 86 fL (ref 78.0–100.0)
RBC: 3.94 MIL/uL (ref 3.87–5.11)
RDW: 15.1 % (ref 11.5–15.5)

## 2010-04-10 LAB — DIFFERENTIAL
Basophils Absolute: 0 10*3/uL (ref 0.0–0.1)
Basophils Relative: 0 % (ref 0–1)
Eosinophils Absolute: 0.1 10*3/uL (ref 0.0–0.7)
Monocytes Absolute: 0.7 10*3/uL (ref 0.1–1.0)
Monocytes Relative: 7 % (ref 3–12)
Neutro Abs: 4.8 10*3/uL (ref 1.7–7.7)
Neutrophils Relative %: 48 % (ref 43–77)

## 2010-04-10 LAB — GLUCOSE, CAPILLARY: Glucose-Capillary: 88 mg/dL (ref 70–99)

## 2010-04-14 ENCOUNTER — Ambulatory Visit: Payer: Medicare Other | Attending: Neurosurgery | Admitting: Rehabilitative and Restorative Service Providers"

## 2010-04-14 DIAGNOSIS — M545 Low back pain, unspecified: Secondary | ICD-10-CM | POA: Insufficient documentation

## 2010-04-14 DIAGNOSIS — IMO0001 Reserved for inherently not codable concepts without codable children: Secondary | ICD-10-CM | POA: Insufficient documentation

## 2010-04-14 DIAGNOSIS — M2569 Stiffness of other specified joint, not elsewhere classified: Secondary | ICD-10-CM | POA: Insufficient documentation

## 2010-04-22 LAB — GLUCOSE, CAPILLARY
Glucose-Capillary: 106 mg/dL — ABNORMAL HIGH (ref 70–99)
Glucose-Capillary: 111 mg/dL — ABNORMAL HIGH (ref 70–99)
Glucose-Capillary: 116 mg/dL — ABNORMAL HIGH (ref 70–99)
Glucose-Capillary: 128 mg/dL — ABNORMAL HIGH (ref 70–99)
Glucose-Capillary: 86 mg/dL (ref 70–99)
Glucose-Capillary: 92 mg/dL (ref 70–99)

## 2010-04-22 LAB — CBC
HCT: 34.9 % — ABNORMAL LOW (ref 36.0–46.0)
MCV: 84.6 fL (ref 78.0–100.0)
Platelets: 308 10*3/uL (ref 150–400)
RDW: 15.4 % (ref 11.5–15.5)

## 2010-04-22 LAB — URINALYSIS, ROUTINE W REFLEX MICROSCOPIC
Bilirubin Urine: NEGATIVE
Nitrite: NEGATIVE
Specific Gravity, Urine: 1.008 (ref 1.005–1.030)
Urobilinogen, UA: 1 mg/dL (ref 0.0–1.0)

## 2010-04-22 LAB — MAGNESIUM: Magnesium: 2 mg/dL (ref 1.5–2.5)

## 2010-04-22 LAB — DIFFERENTIAL
Basophils Absolute: 0.1 10*3/uL (ref 0.0–0.1)
Basophils Relative: 1 % (ref 0–1)
Eosinophils Absolute: 0.1 10*3/uL (ref 0.0–0.7)
Eosinophils Relative: 1 % (ref 0–5)
Neutrophils Relative %: 57 % (ref 43–77)

## 2010-04-22 LAB — POCT I-STAT, CHEM 8
BUN: 8 mg/dL (ref 6–23)
Chloride: 105 mEq/L (ref 96–112)
Creatinine, Ser: 0.7 mg/dL (ref 0.4–1.2)
Glucose, Bld: 101 mg/dL — ABNORMAL HIGH (ref 70–99)
HCT: 37 % (ref 36.0–46.0)
Potassium: 4.2 mEq/L (ref 3.5–5.1)

## 2010-04-22 LAB — POCT CARDIAC MARKERS
CKMB, poc: 1.1 ng/mL (ref 1.0–8.0)
Troponin i, poc: <0.05 ng/mL (ref 0.00–0.09)

## 2010-04-22 LAB — CARDIAC PANEL(CRET KIN+CKTOT+MB+TROPI)
CK, MB: 1.3 ng/mL (ref 0.3–4.0)
Troponin I: 0.01 ng/mL (ref 0.00–0.06)

## 2010-04-22 LAB — LIPID PANEL
Cholesterol: 173 mg/dL (ref 0–200)
LDL Cholesterol: 102 mg/dL — ABNORMAL HIGH (ref 0–99)
Triglycerides: 148 mg/dL (ref <150)

## 2010-04-22 LAB — BRAIN NATRIURETIC PEPTIDE: Pro B Natriuretic peptide (BNP): <30 pg/mL (ref 0.0–100.0)

## 2010-04-22 LAB — TSH: TSH: 0.751 u[IU]/mL (ref 0.350–4.500)

## 2010-04-22 LAB — URINE MICROSCOPIC-ADD ON

## 2010-04-23 ENCOUNTER — Encounter: Payer: Medicare Other | Admitting: Physical Therapy

## 2010-04-27 LAB — URINALYSIS, ROUTINE W REFLEX MICROSCOPIC
Hgb urine dipstick: NEGATIVE
Protein, ur: NEGATIVE mg/dL
Urobilinogen, UA: 1 mg/dL (ref 0.0–1.0)

## 2010-04-27 LAB — URINE MICROSCOPIC-ADD ON

## 2010-04-27 LAB — POCT I-STAT, CHEM 8
BUN: 9 mg/dL (ref 6–23)
Chloride: 108 mEq/L (ref 96–112)
Potassium: 3.9 mEq/L (ref 3.5–5.1)
Sodium: 142 mEq/L (ref 135–145)

## 2010-04-28 ENCOUNTER — Encounter: Payer: Medicare Other | Admitting: Rehabilitative and Restorative Service Providers"

## 2010-04-30 ENCOUNTER — Encounter: Payer: Medicare Other | Admitting: Physical Therapy

## 2010-05-05 LAB — POCT CARDIAC MARKERS
CKMB, poc: <1 ng/mL — ABNORMAL LOW (ref 1.0–8.0)
CKMB, poc: <1 ng/mL — ABNORMAL LOW (ref 1.0–8.0)
Troponin i, poc: <0.05 ng/mL (ref 0.00–0.09)
Troponin i, poc: <0.05 ng/mL (ref 0.00–0.09)

## 2010-05-05 LAB — CBC
HCT: 33.8 % — ABNORMAL LOW (ref 36.0–46.0)
Hemoglobin: 11.4 g/dL — ABNORMAL LOW (ref 12.0–15.0)
MCHC: 32.5 g/dL (ref 30.0–36.0)
MCHC: 32.7 g/dL (ref 30.0–36.0)
MCV: 85.5 fL (ref 78.0–100.0)
MCV: 85.7 fL (ref 78.0–100.0)
Platelets: 304 10*3/uL (ref 150–400)
RBC: 4.06 MIL/uL (ref 3.87–5.11)
RDW: 15.6 % — ABNORMAL HIGH (ref 11.5–15.5)
WBC: 11.5 10*3/uL — ABNORMAL HIGH (ref 4.0–10.5)

## 2010-05-05 LAB — CARDIAC PANEL(CRET KIN+CKTOT+MB+TROPI)
Relative Index: INVALID (ref 0.0–2.5)
Troponin I: <0.01 ng/mL (ref 0.00–0.06)

## 2010-05-05 LAB — POCT I-STAT, CHEM 8
BUN: 7 mg/dL (ref 6–23)
Chloride: 107 mEq/L (ref 96–112)
Creatinine, Ser: 1 mg/dL (ref 0.4–1.2)
Potassium: 3.9 mEq/L (ref 3.5–5.1)
Sodium: 143 mEq/L (ref 135–145)

## 2010-05-05 LAB — DIFFERENTIAL
Basophils Relative: 1 % (ref 0–1)
Eosinophils Absolute: 0.1 10*3/uL (ref 0.0–0.7)
Lymphs Abs: 4.6 10*3/uL — ABNORMAL HIGH (ref 0.7–4.0)
Monocytes Absolute: 0.7 10*3/uL (ref 0.1–1.0)
Monocytes Relative: 7 % (ref 3–12)

## 2010-05-05 LAB — TROPONIN I: Troponin I: <0.01 ng/mL (ref 0.00–0.06)

## 2010-05-05 LAB — GLUCOSE, CAPILLARY
Glucose-Capillary: 186 mg/dL — ABNORMAL HIGH (ref 70–99)
Glucose-Capillary: 89 mg/dL (ref 70–99)

## 2010-05-07 ENCOUNTER — Encounter: Payer: Self-pay | Admitting: *Deleted

## 2010-05-10 ENCOUNTER — Encounter: Payer: Self-pay | Admitting: *Deleted

## 2010-05-15 ENCOUNTER — Ambulatory Visit (INDEPENDENT_AMBULATORY_CARE_PROVIDER_SITE_OTHER): Payer: Medicare Other | Admitting: *Deleted

## 2010-05-15 DIAGNOSIS — R55 Syncope and collapse: Secondary | ICD-10-CM

## 2010-05-19 ENCOUNTER — Ambulatory Visit (HOSPITAL_COMMUNITY)
Admission: RE | Admit: 2010-05-19 | Discharge: 2010-05-19 | Disposition: A | Discharge status: Home / Self Care | Payer: Medicare Other | Source: Ambulatory Visit | Attending: General Surgery | Admitting: General Surgery

## 2010-05-19 ENCOUNTER — Other Ambulatory Visit: Payer: Self-pay | Admitting: General Surgery

## 2010-05-19 ENCOUNTER — Encounter (HOSPITAL_COMMUNITY): Payer: Medicare Other

## 2010-05-19 ENCOUNTER — Other Ambulatory Visit (HOSPITAL_COMMUNITY): Payer: Self-pay | Admitting: General Surgery

## 2010-05-19 DIAGNOSIS — Z01818 Encounter for other preprocedural examination: Secondary | ICD-10-CM | POA: Insufficient documentation

## 2010-05-19 DIAGNOSIS — I517 Cardiomegaly: Secondary | ICD-10-CM | POA: Insufficient documentation

## 2010-05-19 DIAGNOSIS — Z01812 Encounter for preprocedural laboratory examination: Secondary | ICD-10-CM | POA: Insufficient documentation

## 2010-05-19 DIAGNOSIS — Z01810 Encounter for preprocedural cardiovascular examination: Secondary | ICD-10-CM | POA: Insufficient documentation

## 2010-05-19 LAB — SURGICAL PCR SCREEN: Staphylococcus aureus: NEGATIVE

## 2010-05-19 LAB — COMPREHENSIVE METABOLIC PANEL
AST: 13 U/L (ref 0–37)
CO2: 25 mEq/L (ref 19–32)
Chloride: 111 mEq/L (ref 96–112)
Creatinine, Ser: 0.88 mg/dL (ref 0.4–1.2)
GFR calc Af Amer: >60 mL/min (ref >60)
GFR calc non Af Amer: >60 mL/min (ref >60)
Glucose, Bld: 119 mg/dL — ABNORMAL HIGH (ref 70–99)
Total Bilirubin: 0.2 mg/dL — ABNORMAL LOW (ref 0.3–1.2)

## 2010-05-19 LAB — CBC
HCT: 36.9 % (ref 36.0–46.0)
Hemoglobin: 11.6 g/dL — ABNORMAL LOW (ref 12.0–15.0)
MCH: 27.1 pg (ref 26.0–34.0)
MCHC: 31.4 g/dL (ref 30.0–36.0)
MCV: 86.2 fL (ref 78.0–100.0)
RBC: 4.28 MIL/uL (ref 3.87–5.11)

## 2010-05-19 LAB — DIFFERENTIAL
Basophils Relative: 0 % (ref 0–1)
Lymphocytes Relative: 43 % (ref 12–46)
Lymphs Abs: 3.9 10*3/uL (ref 0.7–4.0)
Monocytes Absolute: 0.7 10*3/uL (ref 0.1–1.0)
Monocytes Relative: 8 % (ref 3–12)
Neutro Abs: 4.4 10*3/uL (ref 1.7–7.7)
Neutrophils Relative %: 49 % (ref 43–77)

## 2010-05-20 ENCOUNTER — Ambulatory Visit (HOSPITAL_COMMUNITY): Payer: Medicare Other

## 2010-05-20 ENCOUNTER — Other Ambulatory Visit: Payer: Self-pay | Admitting: General Surgery

## 2010-05-20 ENCOUNTER — Ambulatory Visit (HOSPITAL_COMMUNITY)
Admission: RE | Admit: 2010-05-20 | Discharge: 2010-05-21 | Disposition: A | Discharge status: Home / Self Care | Payer: Medicare Other | Source: Ambulatory Visit | Attending: General Surgery | Admitting: General Surgery

## 2010-05-20 DIAGNOSIS — I2510 Atherosclerotic heart disease of native coronary artery without angina pectoris: Secondary | ICD-10-CM | POA: Insufficient documentation

## 2010-05-20 DIAGNOSIS — J449 Chronic obstructive pulmonary disease, unspecified: Secondary | ICD-10-CM | POA: Insufficient documentation

## 2010-05-20 DIAGNOSIS — Z01818 Encounter for other preprocedural examination: Secondary | ICD-10-CM | POA: Insufficient documentation

## 2010-05-20 DIAGNOSIS — E119 Type 2 diabetes mellitus without complications: Secondary | ICD-10-CM | POA: Insufficient documentation

## 2010-05-20 DIAGNOSIS — Z79899 Other long term (current) drug therapy: Secondary | ICD-10-CM | POA: Insufficient documentation

## 2010-05-20 DIAGNOSIS — Z7902 Long term (current) use of antithrombotics/antiplatelets: Secondary | ICD-10-CM | POA: Insufficient documentation

## 2010-05-20 DIAGNOSIS — E669 Obesity, unspecified: Secondary | ICD-10-CM | POA: Insufficient documentation

## 2010-05-20 DIAGNOSIS — Z01812 Encounter for preprocedural laboratory examination: Secondary | ICD-10-CM | POA: Insufficient documentation

## 2010-05-20 DIAGNOSIS — J4489 Other specified chronic obstructive pulmonary disease: Secondary | ICD-10-CM | POA: Insufficient documentation

## 2010-05-20 DIAGNOSIS — R1013 Epigastric pain: Secondary | ICD-10-CM | POA: Insufficient documentation

## 2010-05-20 DIAGNOSIS — K8010 Calculus of gallbladder with chronic cholecystitis without obstruction: Secondary | ICD-10-CM | POA: Insufficient documentation

## 2010-05-20 LAB — GLUCOSE, CAPILLARY: Glucose-Capillary: 116 mg/dL — ABNORMAL HIGH (ref 70–99)

## 2010-05-21 NOTE — Op Note (Signed)
[name], [name]                 ACCOUNT NO.:  [id number]  MEDICAL RECORD NO.:  [id number]           PATIENT TYPE:  O  LOCATION:  DAYL                         FACILITY:  [facility]  PHYSICIAN:  Anselm Pancoast. Tye Vigo, M.D.DATE OF BIRTH:  [birth year]  DATE OF PROCEDURE:  05/20/2010 DATE OF DISCHARGE:                              OPERATIVE REPORT   PREOPERATIVE DIAGNOSES:  Chronic cholecystitis, exogenous obesity, diabetes mellitus, history of coronary artery disease.  POSTOPERATIVE DIAGNOSES:  Chronic cholecystitis, exogenous obesity, diabetes mellitus, history of coronary artery disease.  OPERATION:  Laparoscopic cholecystectomy with cholangiogram.  ANESTHESIA:  General anesthesia.  SURGEON:  Anselm Pancoast. Zachery Dakins, M.D.  ASSISTANT:  Amber L. Freida Busman, MD  HISTORY:  [name] is a 65 year old overweight black female, history of coronary artery disease and also diabetes.  Dr. Donia Guiles is her regular physician, and she has had episodes of epigastric kind of lower chest discomfort that has been evaluated with the findings of gallstones on an ultrasound.  She takes nitroglycerin on a p.r.n. basis.  She is on Plavix by Dr. Shana Chute, so it would be fine to discontinue the Plavix which she did 5 days ago.  She is here for the planned procedure.  I am planning to keep her overnight and she is on a long list of medications, cardiac and diabetic.  DESCRIPTION OF PROCEDURE:  Preoperatively, she was given 3 g of Unasyn. She has got PAS stockings on one position on the OR table, induction of general anesthesia, endotracheal tube placed.  Oral tube was attached to the stomach and then the abdomen was prepped with the alcohol solution since she is allergic to "Betadine" and the patient was draped in sterile manner after waiting for 3 minutes.  A  small incision was made above the umbilicus down through about 3 inches of adipose tissue.  The fascia was identified.  A small vertical opening  was made.  Two edges cut, held with Kochers, and I carefully entered into the peritoneal cavity.  Pursestring suture of 0 Vicryl was placed and Hasson cannula introduced.  The gallbladder was not acutely inflamed.  It was tense. There were not adhesions around it and the upper 10 mm trocar was placed in the subxiphoid area, the 2 lateral 5 mm trocars were placed in at appropriate lateral position by Dr. Freida Busman.  The gallbladder was retracted upward and outward.  The peritoneum at the proximal portion of the gallbladder was carefully opened.  I could encompass the cystic duct and a clip was placed in the cystic duct junction of the gallbladder. The cystic artery was likewise dissected up, doubly clipped proximally and distally and then this was divided and a Cook catheter was introduced in the proximal cystic duct, held in place with clip and a cholangiogram was obtained.  Good flow into the duodenum.  No evidence of any stones.  About 4 cc of the solution used.  The catheter was removed.  The cystic duct was triply clipped and then divided distally 2 to 3 clips and I could not identify the posterior branch of the cystic artery, carefully dissected, free up  the gallbladder from its bed.  The hook was originally used.  I used the spatula for kind of couple of little areas of bleeding, irrigated, aspirated and put the gallbladder in the EndoCatch bag.  There was a little bit of bile leakage from where the clip proximally was leaking because the gallbladder was so distended and then the camera was placed in the upper 10 mm trocar and the bag containing the gallbladder withdrawn at the umbilicus.  She did have a stone in the gallbladder and the fascia at the umbilicus was tied with a pursestring, a second figure-of-eight 0 Vicryl was placed and then the fascia was anesthetized at the umbilicus.  The irrigating fluid had been removed.  The 5 mm ports were withdrawn under direct vision, the  CO2 released and the upper 10 mm trocar withdrawn.  The wounds after irrigating were closed subcutaneously with 4-0 Vicryl,  benzoin and Steri-Strips on the skin.  The patient tolerated the procedure nicely and was extubated and taken to the recovery room in stable postop condition.  We will keep her on all of her regular medications.  I am going to restart the Plavix tomorrow and she will spend the night and will be discharged in the a.m.     Anselm Pancoast. Zachery Dakins, M.D.     WJW/MEDQ  D:  05/20/2010  T:  05/20/2010  Job:  [id number]  cc:   Osvaldo Shipper. Spruill, M.D. Fax: [phone]  Electronically Signed by Consuello Bossier M.D. on 05/21/2010 11:49:29 AM

## 2010-05-24 NOTE — Progress Notes (Signed)
Loop recorder remote check

## 2010-06-02 NOTE — Discharge Summary (Signed)
[name], [name]                 ACCOUNT NO.:  [id number]   MEDICAL RECORD NO.:  [id number]          PATIENT TYPE:  INP   LOCATION:  4731                         FACILITY:  [facility]   PHYSICIAN:  Osvaldo Shipper. Spruill, M.D.DATE OF BIRTH:  1945/02/10   DATE OF ADMISSION:  02/13/2007  DATE OF DISCHARGE:  02/18/2007                               DISCHARGE SUMMARY   DISCHARGE DIAGNOSES:  1. Candidiasis of esophagus.  2. Body mass index of 40 or over.  3. Prinzmetal angina.  4. Syncope and collapse.  5. Diabetes mellitus.  6. Obesity   CONSULTANT:  Bernette Redbird, M.D.   PROCEDURE:  Esophagogastroduodenoscopy with biopsy.  This was done on  February 15, 2007.   [name] is a 65 year old patient who presented initially to the  emergency department of [facility] with by ambulance with  complaint of chest pain.  The admission history and physical was  completed by Dr. Donia Guiles.  This is a 65 year old female who has a  history of recurrent syncope and chest pain.  The patient has had two  unremarkable stress tests and one positive one.  She reports an allergy  to contrast dye.  She states that this syncopal episode was preceded by  chest pain, a feeling of warmth and then syncope.  She has been seen by  [facility], and it is believed that the patient has a  problem with some coronary artery spasm.  The patient is admitted  however to rule out the possibility of myocardial infarction.  The  patient was evaluated and was noted to have stable vital signs.  There  was no chest wall tenderness.  S1 S2 were okay with no rub or S3  present.  No neurologic deficits were appreciated.   The patient was admitted to the medical service, was placed on  telemetry.  The patient was seen by pharmacy for Lovenox DVT  prophylaxis.   On January 26, the patient was changed from observation to inpatient,  and on January 27 the patient was seen by the electrophysiology team.  The patient was evaluated by Dr. Graciela Husbands, and it was the opinion after  evaluation that the patient had recurrent syncope with evidence of  arrhythmia abnormality as confirmed by a loop recorder that had been  implanted.  The patient has recurrent syncope without evidence of  arrhythmia abnormality, and this was confirmed by the loop recorder.  The patient did complain of chest pain however prior to the syncopal  episodes.  It was the electrophysiology opinion that the patient had  vasomotor instability.  The possibility of the use of yohimbine in this  group of patients to prevent sympathetic withdrawal was suggested.  It  was suggested at a low dose of 5.4 mg twice a day be started.   At this point, a GI consultation was obtained from Dr. Matthias Hughs and his  staff.  It was their opinion that the patient had a history of ulcer,  reflux and constipation and that the patient should be on MiraLax for  constipation.  Plans were made for an  upper EGD given the history of the  ulcer disease, dysphagia and gastritis.  GI consultants were in  agreement with the use of the Protonix that had been ordered and  suggested discontinuance of the Reglan that had previously been ordered.   On February 15, 2007, the patient underwent EGD.  The patient was found  to have possible Candida.  Plans were made for a KOH prep.  The patient  was started on Nystatin.  No other major complications were noted.   It was noted that the patient's hemoglobin A1c was slightly elevated at  8.  It was also noted that the reflux was improved with the increase in  Protonix.   On 01/31, the patient had made significant improvement and it was the  opinion of the attending physician and the consulting physician that the  patient had received maximum benefit from this hospitalization and could  be discharged home.  There were no additional syncopal episodes noted.  The patient is to be seen in the office in 2 to 3 weeks at which  time  additional testing and treatment will be considered for the elevated  hemoglobin A1c.  The patient is notify the physician immediately if any  changes, problems or concerns.  The patient is to be seen on March 02, 2007 by GI for followup.      Ivery Quale, P.A.      Osvaldo Shipper. Spruill, M.D.  Electronically Signed    HB/MEDQ  D:  03/22/2007  T:  03/23/2007  Job:  [id number]

## 2010-06-02 NOTE — Assessment & Plan Note (Signed)
[facility]                         ELECTROPHYSIOLOGY OFFICE NOTE   NAME:Ey, [name]                        MRN:          [id number]  DATE:09/12/2007                            DOB:          [birth year]    [name] returns today for followup.  She is a very pleasant middle-  aged woman with a history of hypertension, diabetes, and syncope of  unknown etiology.  She is status post insertion of an implantable loop  recorder.  She returns today for followup.  She has had no recurrent  syncopal episodes and overall feels well.  She has lost 10 pounds.  Her  main complaint today is that of back pain.  She has seen no doctor about  this and is undergoing current evaluation.  She has been told she has a  bulging disk.   Medications today include,  1. Plavix 75 a day.  2. Imdur 60 a day.  3. Cartia XT 240 a day.  4. Omeprazole 20 twice a day.  5. Carvedilol 12.5 twice daily.  6. Lipitor 10 a day.  7. Xopenex inhalers.  8. Metformin 1 g twice daily.  9. Lipitor 10 mg daily.   On physical exam, she is a pleasant obese middle-aged woman in no  distress.  Blood pressure today was 110/70, the pulse 81 and regular,  respirations were 18, weight was 242 pounds.  Neck revealed no jugular  venous distention.  Lungs were clear bilaterally to auscultation.  No  wheezes, rales, or rhonchi present.  Cardiovascular exam revealed a  regular rate and rhythm with normal S1 and S2.  There are no murmurs,  rubs, or gallops.  Extremities demonstrate no edema.   Interrogation of an implantable loop recorder demonstrated no brady or  tachy arrhythmias.  There was some undersensing noted.   IMPRESSION:  1. Explained syncope.  2. Diabetes.  3. Hypertension.   DISCUSSION:  Overall, [name] is stable.  Her loop recorder is working  normally.  There has been no documented arrhythmias or significant  bradycardia.  We will plan to see the patient back in 6  months.     Doylene Canning. Ladona Ridgel, MD  Electronically Signed    GWT/MedQ  DD: 09/12/2007  DT: 09/13/2007  Job #: [id number]   cc:   Osvaldo Shipper. Spruill, M.D.

## 2010-06-02 NOTE — Consult Note (Signed)
[name], [name]                 ACCOUNT NO.:  [id number]   MEDICAL RECORD NO.:  [id number]          PATIENT TYPE:  INP   LOCATION:  4731                         FACILITY:  [facility]   PHYSICIAN:  Duke Salvia, MD, FACCDATE OF BIRTH:  10-15-1945   DATE OF CONSULTATION:  02/14/2007  DATE OF DISCHARGE:                                 CONSULTATION   Thank you very much for asking [place] to see [name] in consultation for  recurrent syncope.   She is a 65 year old woman who has a history of recurrent syncope dating  back many years ago whom we saw in the fall for a similar diagnosis.  At  that time she underwent EP testing because of the history of antecedent  tachy palpitations.  This was negative, and she then received a loop  recorder.   Her past cardiac history is notable for recurrent problems with chest  pain preceding her syncopal episodes, a number of Myoview scans, and  coronary catheterization demonstrating no obstructive disease.   Her episodes of syncope on this occasion occurred at church. She  developed her typical chest pain.  There was accompanied typical  epiphenomenon of warmth and diaphoresis.  She got up, walked out of the  church, sat down on the stairs and lost consciousness.   The following day she had another episode again of her typical pain,  again preceded by her typical heart rate, normal epiphenomenon. This  time she did not pass out.   She came to hospital where she was admitted and ruled out for myocardial  infarction.  There is no enzymatic evidence of myocardial injury (as has  been true in the past).  Telemetry has been stable.   PAST MEDICAL HISTORY:  Notable for:  1. Hypertension.  2. Obesity.  3. Arthritis.  4. Possible sleep apnea.   PAST SURGICAL HISTORY:  Is notable for:  1. Orthopedic surgeries including:      a.     Hip.      b.     Knees.      c.     Toe.  2. Hysterectomy.  3. Tonsillectomy.  4. Left breast biopsy.   CURRENT  MEDICATIONS:  Included Plavix, Imdur, Cartia, amitriptyline,  Coreg, Reglan, Xopenex, Advair, promethazine.   ALLERGIES:  She is allergic to CONTRAST DYE and ASPIRIN.   SOCIAL HISTORY:  She is retired from working in [employer]. She has to  children.  She does not use cigarettes, alcohol or recreational drugs.   PHYSICAL EXAMINATION:  GENERAL:  She is an obese older African-American  woman appearing somewhat older than her stated age of 15.  VITAL SIGNS:  Her blood pressure 143/93, and her pulse was 68.  HEENT:  Exam demonstrated no icterus or xanthoma.  NECK:  The neck veins were flat.  The carotids are brisk and full  bilaterally without bruits.  BACK:  Without kyphosis or scoliosis.  LUNGS:  Clear.  HEART:  Sounds were regular.  There is an early systolic murmur.  There  are no gallops.  ABDOMEN:  Soft.  Active bowel sounds.  There was no midline pulsation or  hepatomegaly.  EXTREMITIES:  Femoral pulses were not examined.  Distal pulses were  intact.  There is no clubbing, cyanosis or edema.  NEUROLOGICAL:  Exam was grossly normal.  SKIN:  Warm and dry.   Electrocardiogram dated today demonstrated sinus rhythm at 87 with  intervals of 0.1, 0.10, 0.39 with a QTC of 0.47. The axis was mildly  leftward at -25.   Interrogation of her loop recorder demonstrated no abnormal rhythm.  There are some false positive detections for asystole from undersensing.   IMPRESSION:  1. Recurrent syncope without evidence of arrhythmia abnormality as      confirmed by loop recorder.  2. Chest pain preceding #1.  3. Remote history of gastroesophageal reflux disease.  4. Repeated normal Myoview and normal catheterization.  5. Hypertension.   [name] has recurrent syncope in the setting of a normal heart and  now electrocardiographic confirmation of a normal rhythm.  Vasomotor  instability thus becomes the diagnosis as previous suggested and now by  exclusion.  The trigger seems to be a  process that involves concurrent  abdominal pain.  I do not know whether the abdominal pain comes from her  primary abdominal process or not, but consideration should be given to  GI evaluation.   One other therapeutic intervention that comes to mind is use of  yohimbine in this group of patients in which sympathetic withdrawal is  prevented by the drug.  We have used this in the past, albeit sparsely,  in patients who have primarily GI triggers or accompanying GI symptoms.  We would probably start low at 5.4 mg twice a day.   I will see her in the clinic as previously scheduled at that point can  consider loop recorder explantation.   Thank you for the consultation.     Duke Salvia, MD, [facility]  Electronically Signed    SCK/MEDQ  D:  02/14/2007  T:  02/14/2007  Job:  [id number]   cc:   Osvaldo Shipper. Spruill, M.D.

## 2010-06-02 NOTE — Discharge Summary (Signed)
[name], [name]                 ACCOUNT NO.:  [id number]   MEDICAL RECORD NO.:  [id number]          PATIENT TYPE:  INP   LOCATION:  3729                         FACILITY:  [facility]   PHYSICIAN:  Ricki Rodriguez, M.D.  DATE OF BIRTH:  03-Jun-1945   DATE OF ADMISSION:  06/19/2006  DATE OF DISCHARGE:  06/22/2006                               DISCHARGE SUMMARY   FINAL DIAGNOSIS:  1. Chest pain.  2. Esophageal reflux disease.  3. Hip joint replacement status.  4. The joint replacement status.  5. Obesity.   DISCHARGE MEDICATIONS:  1. Coreg 12.5 mg one twice daily  2. Nexium 40 mg one daily.  3. Cardizem 240 mg one daily.  4. Imdur 60 mg one or two daily.  5. Candesartan 8 mg one daily.  6. Metoclopramide 10 mg up to four times daily.  7. Nitroglycerin 0.4 mg tablet one sublingual every 5 minutes x 3 as      needed for chest pain.  8. Lipitor 10 mg one in the evening.  9. Elavil 50 mg at bedtime.  10.Zyrtec D 5/120 mg one twice daily as needed.  11.Xopenex inhaler 2 puffs twice daily as needed.  12.Plavix 75 mg one daily.  13.Advair 250/50 one puff twice daily.   DISCHARGE ACTIVITY:  The patient to increase activity slowly.   DISCHARGE DIET:  Low-sodium heart-healthy diet.   SPECIAL INSTRUCTIONS:  The patient to stop any activity that causes  chest pain, shortness of breath, dizziness, sweating or excessive  weakness.   FOLLOW-UP:  By Dr. Donia Guiles in 2 weeks.  The patient to call [phone] for appointment.   CONDITION ON DISCHARGE:  Improved.   HISTORY:  This 65 year old black female was sitting in the church, had a  substernal tightness, 10/10 along with nausea and sweating spell.  The  pain radiated to the left arm and jaw.  Patient took three sublingual  nitroglycerin with significant relief.   PHYSICAL EXAMINATION:  Temperature 98, pulse 93, respirations 18, blood  pressure 130/85, oxygen saturation 99%.  The patient is averagely built, well-nourished, obese  female in no acute  distress.  HEENT: The patient is normocephalic, atraumatic with pupils equally  reactive to light.  Brown eyes.  Patient wears partial dentures.  NECK:  Supple.  LYMPHADENOPATHY:  none.  CARDIOVASCULAR: Heart regular rate and rhythm.  Normal S1-S2.  LUNGS:  Clear to auscultation bilaterally.  SKIN: No rash  ABDOMEN:  Soft and nontender.  EXTREMITIES: No cyanosis or clubbing.  Trace edema.  NEUROLOGICALLY:  The patient had cranial nerves II-XII grossly intact  and she was alert, oriented x3.   LABORATORY DATA:  Borderline hemoglobin of 11.6, hematocrit 36.4,  borderline WBC count of 11,700, platelet count normal at 371,000.  PT/INR normal, electrolytes normal.  Glucose borderline at 108 and  cardiac enzymes negative x3.  Lipid profile was normal with a total  cholesterol of 144, triglyceride of 79 and LDL cholesterol of 86.  Thyroid stimulating hormone was 0.5.   Chest X-Ray: No acute disease.   Nuclear stress test revealed no inducible  ischemia.   EKG revealed a sinus rhythm with left axis deviation and low voltage QRS   HOSPITAL COURSE:  The patient was admitted to telemetry unit.  Myocardial infarction was ruled out.  She underwent nuclear stress test  that failed to show any reversible ischemia.  The patient was discharged  home on her existing medications and she was advised to follow-up with  her primary cardiology in 2 weeks.      Ricki Rodriguez, M.D.  Electronically Signed     ASK/MEDQ  D:  08/03/2006  T:  08/04/2006  Job:  [id number]

## 2010-06-02 NOTE — Assessment & Plan Note (Signed)
[facility]                         ELECTROPHYSIOLOGY OFFICE NOTE   NAME:Hollywood, [name]                        MRN:          [id number]  DATE:11/28/2006                            DOB:          [birth year]    Ms. Buckle was seen today at the device clinic for follow up of her newly  implanted Reveal loop monitor.   Interrogation of her device demonstrates multiple episodes of asystole  and bradycardia which appear to be undersensing.  Her sensitivity  settings were adjusted today to help prevent this.  She was also signed  up for [facility] transmissions to monitor her device from home as she has  transportation issues.   Her Steri-Strips were removed.  Her wound was without redness or  swelling.  She will return to the clinic in 3 months with Dr. Ladona Ridgel.      Gypsy Balsam, RN,BSN  Electronically Signed      Doylene Canning. Ladona Ridgel, MD  Electronically Signed   AS/MedQ  DD: 11/28/2006  DT: 11/28/2006  Job #: [id number]

## 2010-06-02 NOTE — Consult Note (Signed)
[name], [name]                 ACCOUNT NO.:  [id number]   MEDICAL RECORD NO.:  [id number]          PATIENT TYPE:  OBV   LOCATION:  3707                         FACILITY:  [facility]   PHYSICIAN:  Duke Salvia, MD, FACCDATE OF BIRTH:  [birth year]   DATE OF CONSULTATION:  DATE OF DISCHARGE:                                 CONSULTATION   Thank you very much for asking [place] to see [name] in consultation  for recurrent syncope.   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  She has had a problem with recurrent  chest pain.  She actually was told she had an MI in 1994.  However, she  underwent catheterization in 1997 where she was told her arteries were  clean.  This catheterization was, however, complicated by cardiac arrest  secondary to dye notwithstanding to the fact that she had a contrast dye  allergy preparation that was at least included medications the night  before, the morning of, and just prior to the procedure, the specifics  of which are not known.   She has had problems with recurrent chest pain over the last year and  has actually been hospitalized on a number of occasions and has  undergone Myoview scanning on 3 occasions this year all of which show  normal left ventricular function but a question was raised on most  recent scans to whether there was an anterior wall perfusion defect  consistent with a prior MI.  This observation was not made on the prior  scans.   Her episode of syncope on this occasion was preceded as has been most of  them by chest discomfort.  She describes as squeezing and shortness of  breath.  It is accompanied by diaphoresis and warmth.  There is  significant residual orthostatic intolerance.  She has been out for a  number of minutes with these spells.  On one occasion, EMS was called.  We do not have that  data.  By the time at her most recent episode that  her blood pressure was taken, it was normal.   She has been having recurrent syncope with a similar prodrome for about  25 years.  It is also characterized by an antecedent tachycardia in many  of the situations, and this preceded by irregular heartbeats.   Of note, the patient has also had tachypalpitations unrelated to chest  pain and unassociated with syncope.   PAST MEDICAL HISTORY:  Notable for hypertension although she is not  severely hypertensive currently.  Her cardiac risk factor is negative  for diabetes, cigarette use.  They are positive her family history and  dyslipidemia.  Her past medical history in addition is notable for  symptoms consistent with obstructive sleep apnea for which she underwent  a negative sleep study a couple years ago and arthritis which has  resulted in a number of surgeries including (a) hip surgery, hip  replacement surgery on the  left x2, (b) right total knee (c) toe surgery  x2.  Other surgeries include D&C, hysterectomy, tonsillectomy, and left  breast biopsy that turned out to be benign.   REVIEW OF SYSTEMS:  Noncontributory to the above.   CURRENT MEDICATIONS:  1. Plavix 75.  2. Imdur 60.  3. Cartia 240.  4. Amitriptyline.  5. Coreg 12.5 b.i.d.  6. Reglan.  7. Xopenex.  8. Advair.  9. Promethazine.   ALLERGIES:  SHE IS ALLERGIC TO CONTRAST DYE AND ASPIRIN.   SOCIAL HISTORY:  Notable as above.  She has 2 children.  She moved down  here under the care of her son.  She does not use cigarettes, alcohol,  or recreational drugs.   PHYSICAL EXAMINATION:  VITAL SIGNS:  On examination today, her blood  pressure was 106/63.  It was also 116/83 yesterday.  Her pulse was 80.  GENERAL:  She is a well-developed, obese, African-American female in no  acute distress appearing her stated age of 65.  HEENT:  Exam demonstrated no trismus, xanthoma.  The neck veins were  flat.  The carotids are  brisk and full bilaterally without bruits.  BACK:  Without kyphosis or scoliosis.  LUNGS:  Clear.  HEART:  Sounds were regular with an early systolic murmur. An S4 was  present.  ABDOMEN:  Protuberant but soft.  No midline pulsation was appreciated.  There is no hepatomegaly.  Femoral pulses were not examined.  Distal  pulses were intact.  There is no clubbing, cyanosis, or edema.  NEUROLOGICAL:  Grossly normal.  SKIN:  Warm and dry.   LABORATORIES:  Notable for an elevated D-dimer of 0.7.  Her hemogram was  mildly abnormal with a hemoglobin of 11.  Her initial blood gas was  notable for a PCO2 of 30 with a pH of 7.5.  Renal function was normal.  Cardiac enzymes were negative.   Electrocardiogram dated October 26 demonstrated sinus rhythm at 98 with  intervals of 0.15/0.1/0.31.  The axis was mildly leftward.  There was  poor R-wave progression consistent with a possible anterior wall  myocardial infarction.   IMPRESSION:  1. Recurrent syncope accompanied by epiphenomenon suggestive of a      neurally mediated syndrome including warmth, diaphoresis, and      residual orthostatic intolerance.  2. Tachypalpitations associated with #1 and tachypalpitations      occurring separately.  3. Remote myocardial infarction with a catheterization in 1997      demonstrating clean arteries but complicated by dye reaction as      noted previously.  4. Recurrent hospitalizations for chest pain with 3 negative Myoview      scans done this year with a question about an anterior wall      perfusion defect.  5. Symptoms suggestive of obstructive sleep apnea with a borderline      negative sleep study a couple of years ago.  6. Significant obesity.  7. Arthritis.   DISCUSSION:  [name] has recurrent syncope in the setting of normal  left ventricular function, no clear myocardial infarction,  notwithstanding her history, and tachypalpitations.  These are  frequently accompanied by chest  discomfort in the setting of GE reflux  disease.  I suspect the mechanism is that she has recurrent reflux, and  triggers a dysautonomic reaction which is accompanied by palpitations  secondary to sympathetic surge.  Against this, however, is the  observation that she has had some tachypalpitations which are abrupt in  onset and  offset unaccompanied by chest pain.  This raises the  possibility that the tachycardia is a primary issue causing chest pain  secondary to over demand in a hypertrophic heart.   Two strategies I think would be reasonable to consider:  The first would  be the use of event loop recorder to clarify whether an arrhythmia is  primarily contributing or whether this is primarily a vasomotor  phenomenon.  The alternative would be to undertake electrophysiological  testing to see if a specific tachyarrhythmia, presumably an ST-T could  be induced which might be mediating these symptoms.  In the event that  this were the case, catheter ablation could be undertaken.  In the event  that nothing was seen, a loop recorder insertion would make sense.   I have reviewed the above with her extensively.  I also think that she  will need to undergo repeat catheterization if we can figure out a way  to do it safely.  Given the extent of premedication that she describes  from her catheterization in 1997 and the admonition from her doctor that  she should never see contrast dye again, I think it is imperative for [place]  to review the medical records from the [facility] prior  to proceeding down that way.   In addition, I think the issue of her elevated D-dimer needs to be  reviewed and again with her contrast nuclear imaging probably is the  most practical way to go, and a V/Q scan will be arranged.   After the extensive discussion, the patient would like to proceed with  EP testing to see if something definitive is doable, and if not, is  interested in having a loop  recorder implanted.   Thank you very much for the consultation.      Duke Salvia, MD, [facility]  Electronically Signed     SCK/MEDQ  D:  11/14/2006  T:  11/15/2006  Job:  [id number]   cc:   Osvaldo Shipper. Spruill, M.D.  Electrophysiology Laboratory

## 2010-06-02 NOTE — Op Note (Signed)
NAMECLEOPATRA, [name]                 ACCOUNT NO.:  [id number]   MEDICAL RECORD NO.:  [id number]          PATIENT TYPE:  INP   LOCATION:  4731                         FACILITY:  [facility]   PHYSICIAN:  Bernette Redbird, M.D.   DATE OF BIRTH:  [birth year]   DATE OF PROCEDURE:  02/15/2007  DATE OF DISCHARGE:                               OPERATIVE REPORT   PROCEDURE:  Upper endoscopy with esophageal brushings.   INDICATIONS:  65 year old female endoscoped in the past by Dr. Evette Cristal,  with intensified reflux type symptoms and questionable LPR, admitted to  the hospital because of recurrent syncope.   FINDINGS:  Probable mild esophageal candidiasis.   PROCEDURE IN DETAIL:  The nature, purpose, and risks of the procedure  have been reviewed with the patient who provided written consent.  Sedation was fentanyl 75 mcg and Versed 7.5 mg IV without clinical  instability during the course of the procedure.  The Pentax video  endoscope was passed under direct vision.  The larynx looked normal,  specifically without evidence of reflux laryngitis.  The esophagus was  readily entered.  There was a moderate amount of white exudate, not  really strongly adherent, throughout the length of the esophagus.  It  looked like mild candidiasis, although it was not thick curd-like  exudate.  Brushings were obtained of it to send for potassium hydroxide  analysis.  The esophagus was, otherwise, normal.  I did not see any  evidence of Barrett's esophagus, reflux esophagitis, free reflux,  varices, infection, neoplasia, or any ring, stricture or hiatal hernia.   The stomach was entered.  It contained no residual and had normal mucosa  including a retroflexed view of the cardia.  Specifically, no gastritis,  erosions, ulcers, polyps or masses were seen.  The pylorus, duodenal  bulb and second duodenum also looked normal.   The patient tolerated the procedure well and there were no apparent  complications.   IMPRESSION:  1. Exudative esophagitis consistent with mild candidal esophagitis,      brushings pending.  2. Reflux type symptoms, without evidence of reflux induced esophageal      mucosal injury, possibly due to a standard infection.   PLAN:  Await brushing results, but in the meantime, I will start my  statin swish and swallow.  I would probably favor having the patient on  twice daily PPI therapy for the time being and then eventually seen if  she could be decreased to once daily dosing once the apparent Candida  infection is treated.           ______________________________  Bernette Redbird, M.D.     RB/MEDQ  D:  02/15/2007  T:  02/15/2007  Job:  [id number]   cc:   Osvaldo Shipper. Spruill, M.D.  Graylin Shiver, M.D.

## 2010-06-02 NOTE — Op Note (Signed)
NAMEMARQUISE, LAMBSON                 ACCOUNT NO.:  [id number]   MEDICAL RECORD NO.:  [id number]          PATIENT TYPE:  OBV   LOCATION:  3714                         FACILITY:  [facility]   PHYSICIAN:  Doylene Canning. Ladona Ridgel, MD    DATE OF BIRTH:  1945/09/17   DATE OF PROCEDURE:  11/15/2006  DATE OF DISCHARGE:                               OPERATIVE REPORT   PROCEDURE PERFORMED:  Implantation of a implantable loop recorder.   INDICATIONS:  Unexplained syncope room.   INTRODUCTION:  The patient is a 65 year old woman with history of  recurrent episodes of syncope associated with palpitations.  She  underwent catheterization demonstrating no obstructive coronary disease  in the past.  She underwent EP study, demonstrating atrial fibrillation  with no rapid rate and no post termination pauses and now referred for  insertion of implantable loop recorder.   PROCEDURE:  After informed was obtained, the patient was taken  diagnostic EP lab in fasting state.  After usual preparation she was  placed in the supine position.  30 mL of lidocaine was infiltrated into  the left pectoral region.  A 3 cm incision was carried over this region  approximately 4 inches below the clavicle and 3 inches lateral to the  midline.  Electrocautery utilized to make a subcutaneous pocket and  assure hemostasis.  The Medtronic Reveal DX model 9528 implantable loop  recorder, serial number [id number] was placed in the pocket and secured  with silk suture.  Kanamycin was then utilized to irrigate the pocket.  The incision closed with layer of 2-0 Vicryl followed by layer of 3-0  Vicryl.  Benzoin painted on the skin Steri-Strips were applied and  pressures was placed.  The patient was returned to her room in  satisfactory condition.   COMPLICATIONS:  There were no immediate procedure complications.   RESULTS:  These demonstrate successful implantation of a Medtronic  implantable loop recorder in a patient with recurrent  episodes of  unexplained syncope.      Doylene Canning. Ladona Ridgel, MD  Electronically Signed     GWT/MEDQ  D:  11/15/2006  T:  11/15/2006  Job:  [id number]   cc:   Osvaldo Shipper. Spruill, M.D.

## 2010-06-02 NOTE — Consult Note (Signed)
[name], [name]                 ACCOUNT NO.:  [id number]   MEDICAL RECORD NO.:  [id number]          PATIENT TYPE:  INP   LOCATION:  4731                         FACILITY:  [facility]   PHYSICIAN:  Bernette Redbird, M.D.   DATE OF BIRTH:  1945/03/06   DATE OF CONSULTATION:  02/14/2007  DATE OF DISCHARGE:                                 CONSULTATION   We were asked to see [name] today in consultation for abdominal pain  by Dr. Shana Chute.   HISTORY OF PRESENT ILLNESS:  This is a 65 year old obese female who is  complaining of reflux-type pain times 3 weeks.  This pain was not  remedied with Mylanta and Nexium.  The pain is worse when she eats or  drinks anything.  She also has problems swallowing in that food seems to  get caught in her oropharyngeal area.  The patient has been seen by an  ENT for this who told her the problems were allergies and reflux  related.  The patient was brought in for recurrent syncope and possible  coronary artery spasm, questionable Prinzmetal angina.  Thus far her  cardiac workup has been negative and we have been asked to see her to  attempt to find a source for her epigastric pain.  She also reports some  bright red blood per rectum secondary to her external hemorrhoids.  She  tells me she has had constipation for years and takes both Reglan and  milk of magnesia in order to have a weekly bowel movement.  She denies  any melena or vomiting as well as fever.  The patient reports a history  of an ulcer many years ago discovered via upper endoscopy.   PAST MEDICAL HISTORY:  Is significant for:  1. Recurrent syncope.  2. Esophageal reflux disease.  She is a patient of Dr. Wandalee Ferdinand.  3. Asthma.  4. Hypertension.  5. Arrhythmia.  6. 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  7. She has had a hemorrhoidectomy in June 2006.  8. She has had total knee replacement.  9. She has had total hip replacement.   GI HISTORY:  Her last upper endoscopy was  in February 2006 by Dr. Evette Cristal  who found gastritis as well as a hiatal hernia.  On the same date in  2006, he did a flexible sigmoidoscopy and found diverticulosis as well  as internal hemorrhoids.  In September 2005, she had a full colonoscopy  secondary to rectal bleeding.  She was found to have diverticulosis.  Biopsies taken at that time were benign and hyperplastic polyps.  Finally, she does have a history of cystoscopy with urethral dilation in  May 2003.   MEDICATION ALLERGIES:  To ASPIRIN, PERCOCET, IODINE, CONTRAST MEDIA, AND  NSAIDs.   CURRENT MEDICATIONS INCLUDE:  Plavix, Imdur, amitriptyline, Nexium,  Coreg, Lipitor, Reglan, Xopenex, Advair, and promethazine.   SOCIAL HISTORY:  She is a retired Environmental education officer.  She does not drink, smoke  or use recreational drugs.   FAMILY HISTORY:  Is negative for colon cancer and ulcer disease.   ON PHYSICAL EXAM:  She is alert and oriented, very pleasant to speak  with, in no apparent distress.  She is obese.  Temperature is 98.1,  pulse is 83, respirations are 18, blood pressure is 116/74.  HEART:  Has a regular rate and rhythm.  LUNGS:  Clear to auscultation anteriorly.  ABDOMEN:  Is mildly tender in the left lower quadrant, it is  protuberant, soft, and has good bowel sounds.   LABS:  Today, show an amylase of 48, BMET of sodium 137, potassium 3.8,  BUN 5, creatinine 0.81, glucose is 225.  LFTs are within normal limits.  Her hemoglobin yesterday was 13.9, hematocrit 41.   RADIOLOGICAL EXAM:  No radiological exams of her abdomen have been done  yet.  A 2-view abdominal x-ray has been ordered.   ASSESSMENT:  Dr. Molly Maduro Buccini has seen and examined the patient,  collected a history, and reviewed her chart.  His impression is this is  a 65 year old female who is experiencing acid reflux as well as  significant constipation in addition to her other medical problems.  Given her history of ulcer disease and reflux, it would be wise to   recheck an upper endoscopy.  Will go ahead and do so in the morning.  Will also give MiraLax for her constipation.  Agree with Protonix.  Will  discontinue her Reglan.  Next steps are to be determined.      Stephani Police, PA    ______________________________  Bernette Redbird, M.D.    MLY/MEDQ  D:  02/14/2007  T:  02/15/2007  Job:  [id number]   cc:   Osvaldo Shipper. Spruill, M.D.

## 2010-06-02 NOTE — Assessment & Plan Note (Signed)
[facility]                         ELECTROPHYSIOLOGY OFFICE NOTE   NAME:[name], [name]                        MRN:          [id number]  DATE:03/14/2007                            DOB:          1945-05-22    [name] returns in followup.  She is a very pleasant middle-age obese  woman with a history of syncope unexplained and symptomatic  palpitations.  She returns today for followup.  She is status post  insertion of implantable loop recorder.  She has been stable.  She does  note symptomatic palpitations and the sensation of heartbeats.  Otherwise she had no specific complaints.   PHYSICAL EXAMINATION:  GENERAL:  She is a pleasant obese middle-age  woman in no distress.  VITAL SIGNS:  Blood pressure 117/76, pulse 90 and regular, respirations  18.  Weight was 256 pounds.  NECK:  Revealed no jugular distention.  LUNGS:  Clear bilaterally to auscultation.  No wheezes, rales or rhonchi  are present.  CARDIOVASCULAR:  Exam reveals a regular rate and rhythm.  Normal S1-S2.  EXTREMITIES:  Demonstrate no edema.   MEDICATIONS:  1. Plavix 75 a day.  2. Imdur 60 a day.  3. Cartia 240 a day.  4. Carvedilol 12.5 twice daily.  5. Xopenex and Advair inhalers.   IMPRESSION:  1. Unexplained syncope.  2. Hypertension.  3. Morbid obesity.   DISCUSSION:  Overall, the patient is stable.  I have asked that we  follow her up in 6 months for loop recorder followup.  I have cautioned  her that her palpitations are not particularly dangerous.  I have asked  that she maintain a low-sodium diet and try to lose weight for [place].  We  will see her back in 6 months or sooner if needed.     Doylene Canning. Ladona Ridgel, MD  Electronically Signed    GWT/MedQ  DD: 03/14/2007  DT: 03/14/2007  Job #: [id number]   cc:   Osvaldo Shipper. Spruill, M.D.

## 2010-06-02 NOTE — Op Note (Signed)
NAMEPHOENICIA, [name]                 ACCOUNT NO.:  [id number]   MEDICAL RECORD NO.:  [id number]          PATIENT TYPE:  OBV   LOCATION:  3714                         FACILITY:  [facility]   PHYSICIAN:  Doylene Canning. Ladona Ridgel, MD    DATE OF BIRTH:  01-02-1946   DATE OF PROCEDURE:  11/15/2006  DATE OF DISCHARGE:                               OPERATIVE REPORT   PROCEDURE PERFORMED:  Invasive electrophysiologic study followed by  insertion of implantable loop recorder.   INDICATIONS:  The patient is a very pleasant 65 year old obese woman  with history of hypertension, palpitations and recurrent syncope who has  undergone catheterization in the past.  She carries a remote history of  MI, but has no significant LV dysfunction.  She notes that during her  syncopal episode she will feel palpitations preceding the spells and is  now referred for additional evaluation.   PROCEDURE:  After informed obtained, the patient taken diagnostic EP lab  in a fasting state.  After usual preparation and draping, intravenous  fentanyl and midazolam was given for sedation.  A 5-French quadripolar  catheter was inserted percutaneously into the right femoral vein and  advanced to the right atrium.  A 5-French quadripolar catheter was  inserted percutaneously in the right femoral vein and advanced to His  bundle region.  A 5-French quadripolar catheter was inserted  percutaneously in the right femoral vein and advanced to the right  ventricle.  After measuring the basic intervals, rapid ventricular  pacing was carried out from the RV apex demonstrating VA Wenckebach  cycle length of 330 milliseconds.  During rapid ventricular pacing the  atrial activation was midline and decremental.  Next programmed  ventricular stimulation was carried out from the RV apex at a base drive  cycle length of 045 milliseconds.  The S1-S2 interval stepwise decreased  down to 200 milliseconds where ventricular refractoriness was observed.  During programmed ventricular stimulation the atrial activation sequence  was midline and decremental.  Next S1-S2, S1, S2-S3, and S1-S2, S3-S4  stimuli were delivered, where the S1-S2, S2-S3, and S3-S4 intervals were  stepwise decreased down to ventricular refractoriness.  Despite an  aggressive pacing protocol with programmed ventricular stimulation there  no inducible arrhythmias.  Next the catheter was moved into the RV  outflow tract and additional programmed stimulation was carried out from  the right ventricle.  Again, S1-S2, S1, S2-S3, S1-S2, S3-S4 stimuli were  delivered with the S1-S2, S2-S3, S3-S4 interval stepwise decreased down  to ventricular fractions.  During programmed ventricular stimulation the  atrial activation remained midline decremental.  There were no inducible  arrhythmias.  Next programmed atrial stimulation was carried out from  the high right atrium at base drive cycle length of 409 milliseconds.  This S1-S2 interval stepwise decreased down to 310 milliseconds where AV  node ERP was observed.  During probing stimulation there no A-H jumps  and no echo beats and no inducible SVT initially.  Next rapid atrial  pacing was carried out from the right atrium and stepwise decreased down  to 390 milliseconds where AV Wenckebach was  observed.  During rapid  atrial pacing the Sydney-R interval was less than the R-R interval and there  was no inducible SVT.  Isoproterenol was then infused at a rate of 1 mcg  per minute at 15 mL/hour and additional programmed atrial stimulation,  rapid atrial pacing was carried out.  Had an S1-S2 coupling interval  500/200 and there was easily inducible atrial fibrillation.  This was  sustained, but terminated spontaneously.  It would typically be  initiated with a brief episode of atrial flutter preceding it.  When the  atrial fibrillation terminated there no long post termination pauses  noted.  At this point with no additional arrhythmias  induced the  catheters were removed.  Hemostasis was assured, the patient was  returned and made ready for implantable loop recorder insertion.   COMPLICATIONS:  There were immediate procedure complications.   RESULTS:   BASELINE ECG:  The baseline ECG demonstrates sinus rhythm with normal  axis intervals.   BASELINE INTERVALS:  The sinus node cycle length was 640 milliseconds.  The H-V interval was 53 milliseconds.  QRS duration was 88 milliseconds.   RAPID VENTRICULAR PACING:  Rapid ventricular pacing was carried out from  the RV apex demonstrated VA Wenckebach cycle length of 330 milliseconds.  During rapid ventricular pacing there were no inducible arrhythmias.   PROGRAMMED VENTRICULAR STIMULATION:  Programmed ventricular stimulation  was carried out from the RV apex to base drive cycle length of 782  milliseconds.  The S1-S2 interval stepwise decreased down 200  milliseconds where ventricular refractoriness was observed.  During  programmed ventricular stimulation the atrial activation sequence was  midline and decremental.   RAPID ATRIAL PACING:  Rapid pacing was carried out from the high right  atrium at a pacing cycle length of 600 milliseconds stepwise decreased  down to 390 milliseconds where AV Wenckebach was observed.  During rapid  atrial pacing the Sydney-R interval was less than the R-R interval and no SVT  was induced.   PROGRAMMED ATRIAL STIMULATION:  Programmed atrial stimulation was  carried out from the high right atrium at base drive cycle length of 956  milliseconds.  The S1-S2 interval stepwise decreased down to 310  milliseconds where AV node ERP was observed.  During isoproterenol  infusion additional programmed atrial stimulation was carried out  resulting in the initiation of atrial fibrillation.   ARRHYTHMIAS OBSERVED.:  Atrial fibrillation initiation.  Programmed  atrial stimulation on isoproterenol duration was sustained, termination  was  spontaneous.   MAPPING:  No mapping was carried out secondary to the AFib induced.   CONCLUSION:  1. Study demonstrates inducible atrial fibrillation with programmed      atrial stimulation on isoproterenol.  However, with termination of      atrial fibrillation which was spontaneous there was no post      termination pauses.  Her atrial fibrillation rate was not      particularly fast in the 100 to 110 beat per minute range.  2. This study demonstrates no electrophysiologic evidence to explain      the patient's recurrent syncope.  However, her palpitations and      inducible atrial fibrillation do raise the question as to whether      or not symptoms are results or contributed to her atrial      fibrillation.  With this in mind we will plan to proceed with      insertion of implantable loop recorder which will be dictated on  separate dictation.      Doylene Canning. Ladona Ridgel, MD  Electronically Signed     GWT/MEDQ  D:  11/15/2006  T:  11/15/2006  Job:  [id number]   cc:   Osvaldo Shipper. Spruill, M.D.

## 2010-06-05 NOTE — Op Note (Signed)
NAMEJANAYE, CORP                 ACCOUNT NO.:  [id number]   MEDICAL RECORD NO.:  [id number]          PATIENT TYPE:  AMB   LOCATION:  ENDO                         FACILITY:  [facility]   PHYSICIAN:  Graylin Shiver, M.D.   DATE OF BIRTH:  1945-09-19   DATE OF PROCEDURE:  10/08/2003  DATE OF DISCHARGE:                                 OPERATIVE REPORT   PROCEDURE:  Colonoscopy with biopsy.   SURGEON:   INDICATIONS FOR PROCEDURE:  Rectal bleeding, history of constipation.   Informed consent was obtained after explanation of the risks of bleeding,  infection and perforation.   PAIN MEDICATIONS:  Fentanyl 70 mcg IV and Versed 7 mg IV.   DESCRIPTION OF PROCEDURE:  With the patient in the left lateral decubitus  position, a rectal examination was performed.  No masses were felt. The  Olympus colonoscope was inserted into the rectum and advanced around the  colon to the cecum.  Cecal landmarks were identified.  The cecum and  ascending colon looked normal.  The transverse colon looked normal. The  descending colon and sigmoid revealed moderate diverticulosis.  In the  sigmoid colon at 30 cm, there was a slightly reddish area of mucosa and  initially this seemed to pucker out into the lumen.  It, however, was in an  area of numerous diverticula and I palpated this area and it seemed to  retract.  I suspect that this might be an area of an everted diverticulum.  The mucosa around it was different appearing than the rest of the smooth  mucosa.  It was more of a cobblestone appearing mucosa and I did obtain a  couple of biopsies from this region for histological inspection.  The rectum  looked normal.  She tolerated the procedure well without complications.   IMPRESSION:  Diverticulosis.   PLAN:  A biopsy was obtained from the area as described above and this will  be checked.       SFG/MEDQ  D:  10/08/2003  T:  10/09/2003  Job:  [id number]   cc:   Osvaldo Shipper. Spruill, M.D.  [address] [id number]  [place]  [place] [address]  Fax: [phone]

## 2010-06-05 NOTE — Discharge Summary (Signed)
[name], [name]                 ACCOUNT NO.:  [id number]   MEDICAL RECORD NO.:  [id number]          PATIENT TYPE:  INP   LOCATION:  3731                         FACILITY:  [facility]   PHYSICIAN:  Mohan N. Sharyn Lull, M.D. DATE OF BIRTH:  04-25-1945   DATE OF ADMISSION:  10/13/2005  DATE OF DISCHARGE:                                 DISCHARGE SUMMARY   ADMITTING DIAGNOSES:  1. Accelerated angina, rule out myocardial infarction.  2. Hypertension.  3. Morbid obesity.  4. History of variant angina.  5. History of bronchial asthma.  6. Positive family history of coronary artery disease.  7. Degenerative joint disease.  8. Gastroesophageal reflux disease.  9. Depression.  10.Morbid obesity.   DISCHARGE DIAGNOSES:  1. Stable variant angina, negative Persantine Myoview.  2. Hypertension.  3. Glucose intolerance.  4. History of bronchial asthma.  5. Hypercholesterolemia.  6. Degenerative joint disease.  7. Gastroesophageal reflux disease.  8. Depression.  9. Positive family history of coronary artery disease.   DISCHARGE MEDICATIONS:  1. Plavix 75 mg 1 tablet daily with food.  2. Diltiazem controlled release 240 mg 1 capsule daily.  3. Imdur  60 mg 1 tablet daily in the morning.  4. Nexium 40 mg 1 capsule daily 1/2 hour before breakfast.  5. Amitriptyline 50 mg 1 tablet daily at night.  6. Advair 250/50 one puff twice daily as before.  7. Nitrostat  0.4 mg sublingual use as directed.  8. Lipitor 10 mg 1 tablet daily.  9. Diovan 80 mg 1 tablet daily.   DIET:  Low-salt, low-cholesterol.  Patient has been advised to watch sweets.  Patient has been advised to reduce weight.   Follow up with Dr. Shana Chute in 1 week.  Condition at discharge is stable.   BRIEF HISTORY AND HOSPITAL COURSE:  Ms. Bassin is a 65 year old black female  with past medical history significant for variant angina, history of  questionable cardiac arrhythmia, history of bronchial asthma, morbid  obesity,  degenerative joint disease, GERD.  She came to the ER complaining  of retro  sternal chest tightness/pressure radiating into the left arm,  associated with nausea and mild shortness of breath.  Patient received  sublingual nitro with relief of chest pain.  States since last 2 weeks,  chest pain was happening about almost daily, lasting longer time and  requiring more sublingual nitro.  States chest pain occasionally increases  with deep breathing and movement.  Denies any cough, fever, chills.  Denies  palpitation, lightheadedness.  Also denies PND, orthopnea, leg swelling.  States she had  last cath  approximately 8 or 9 years ago in [place], and  was told to have spasm in coronary arteries, but there were no blockages.   PAST MEDICAL HISTORY:  As above.   PAST SURGICAL HISTORY:  She had left hip replacement x2 in 1992 and 1998.  Had right knee total knee replacement in 2002.  Had tonsillectomy many years  ago, and left breast biopsy many years ago.   ALLERGIES:  She is allergic to aspirin and IV contrast.   SOCIAL HISTORY:  She is married, 2 children.  Smoked 1 pack per week for 2  years, quit 25 years ago.  No history of alcohol abuse.  She is a retired  Investment banker, operational.  Born in [place], lives in [place].   FAMILY HISTORY:  Father died of MI in his 30s.  Mother died of pancreatic  cancer.  One sister had MI in her 64s.  Two brothers are hypertensive, and  brother is diabetic.   MEDICATION AT HOME:  She was on:  1. Coreg 1.5 mg p.o. q.12 hours.  2. Diltiazem 240 p.o. daily.  3. Imdur  30 mg p.o. daily.  4. Nexium 40 mg p.o. daily  5. Amitriptyline 50 mg p.o. q.h.s.  6. Advair 250/50 b.i.d.  7. Atacand 16 mg p.o. daily.  8. Reglan 10 mg q.i.d.  9. Nitroglycerin sublingual p.r.n.   OBJECTIVE:  On examination, she is alert, awake, and oriented x3, in no  acute distress.  Blood pressure is 123/73.  Pulse is 87.  __________.  NECK:  Supple.  No JVD.  No bruit.  Lungs are clear to  auscultation without rhonchi  or rales, with no wheezing.  CARDIOVASCULAR EXAM:  S1, S2 is normal.  There  is no S3 or gallop.  ABDOMEN:  Soft.  Bowel sounds are present.  Obese,  nontender.  EXTREMITIES:  There is no clubbing, cyanosis, or edema.  EKG  showed normal sinus rhythm with  poor R wave  progression in V1-6.  There  are no specific T-wave changes.   LABORATORY DATA:  TSH was 1.73.  Her cholesterol was 190, triglyceride 246,  LDL 99, HDL 42.  Three sets of  CPK-MB  and  troponin I were negative.  Sodium was 139.  Potassium 3.8, chloride 107,  bicarb  25.  Glucose was 111,  repeat fasting sugar was 102.  BUN 5, creatinine 0.9.  Liver enzymes were  normal.  Hemoglobin was 12, hematocrit 36.8, white count of 10.7, which has  been stable.   BRIEF HOSPITAL COURSE:  Patient was admitted  to telemetry unit and MI was  ruled out by serial enzymes and  on EKG.  Patient was started on IV nitrates  and heparin.  Subsequently, patient underwent Persantine Myoview which  showed no evidence of  reversible ischemia with normal EF.  Patient did have  episodes of chest pain during  the hospital stay.  Her nitrates dose was increased and beta-blockers were  held due to history of variant angina and  with resolution of chest pain.  Patient is off heparin and IV nitrates for the last 24 hours without any  chest pain.  Patient will be discharged with all above medications and will  be followed up by Dr. Shana Chute in 1 week.           ______________________________  Eduardo Osier. Sharyn Lull, M.D.     MNH/MEDQ  D:  10/17/2005  T:  10/17/2005  Job:  [id number]   cc:   Osvaldo Shipper. Spruill, M.D.  Eduardo Osier. Sharyn Lull, M.D.

## 2010-06-05 NOTE — Op Note (Signed)
[name], [name]                 ACCOUNT NO.:  [id number]   MEDICAL RECORD NO.:  [id number]          PATIENT TYPE:  AMB   LOCATION:  ENDO                         FACILITY:  [facility]   PHYSICIAN:  Graylin Shiver, M.D.   DATE OF BIRTH:  12-20-1945   DATE OF PROCEDURE:  03/04/2004  DATE OF DISCHARGE:                                 OPERATIVE REPORT   PROCEDURE PERFORMED:  Esophagogastroduodenoscopy with biopsy.   INDICATIONS:  Chronic heartburn.   Informed consent was obtained after explanation of the risks of bleeding,  infection and perforation.   PREMEDICATION:  Fentanyl 75 mcg IV, Versed 7.5 milligrams IV.   PROCEDURE:  With the patient in the left lateral decubitus position. The  Olympus gastroscope was inserted into the oropharynx and passed into the  esophagus. It was advanced down the esophagus and into the stomach and into  the duodenum. The second portion and bulb of the duodenum were normal. The  stomach showed a diffuse mild erythema compatible with gastritis. No ulcers  or erosions were seen. Biopsies were obtained for histological inspection.  No lesions were seen in the fundus or cardia on retroflexion.  There was a  hiatal hernia present. The scope was straightened and brought into the  esophagus. The esophagogastric junction was at 36 cm.  It looked normal. The  esophageal mucosa looked normal. She tolerated the procedure well without  complications.   IMPRESSION:  1.  Gastritis.  2.  Hiatal hernia.   PLAN:  I would recommend continuing the patient on Nexium for heartburn.      SFG/MEDQ  D:  03/04/2004  T:  03/04/2004  Job:  [id number]   cc:   Osvaldo Shipper. Spruill, M.D.  [address] [address]  [place]  [place] [address]  Fax: [phone]

## 2010-06-05 NOTE — Op Note (Signed)
[place]. [facility]  Patient:    [name], [name] Visit Number: [id number] MRN: [id number]          Service Type: SUR Location: 5000 [id number] Attending Physician:  Jacki Cones Dictated by:   Veverly Fells Ophelia Charter, M.D. Proc. Date: 11/14/00 Admit Date:  11/14/2000                             Operative Report  PREOPERATIVE DIAGNOSIS:  Right knee osteoarthritis.  PROCEDURE:  Right knee osteoarthritis.  PROCEDURE:  Right knee total knee arthroplasty.  SURGEON:  Mark C. Ophelia Charter, M.D.  ASSISTANT:  Zonia Kief, P.A.-C.  ANESTHESIA:  GOT.  TOURNIQUET TIME:  1 hour 35 minutes x 350.  COMPONENTS USED:  Osteonics #5 Scorpio femur, 10 mm posterior-stabilized spacer, #5 cemented patella, and #5 cemented tibial tray.  DESCRIPTION OF PROCEDURE:  After adequate general anesthesia, orotracheal intubation, preoperative Ancef prophylaxis, the patient had a Foley catheter inserted, the standard prepping and draping was performed, impervious stockinette, Coban, and the usual total knee sheets.  Sterile skin marker and Betadine Vi-Drape application was performed.  The leg was wrapped with an Esmarch prior to tourniquet inflation.  A midline incision was made. Superficial retinaculum was developed over the medial aspect for later closure, and medial parapatellar incision was made splitting the quad tendon between the medial one-third and lateral two-thirds.  The patella was everted. There were very large spurs, greater than a centimeter, on the patella, and these were trimmed at the beginning.  Medial tibial anterior cortex was subperiosteally stripped using the Bovie electrocautery.  Z retractors were placed.  The medial meniscus remnant was removed.  There were marginal osteophytes on the femur as well as medial tibia, which were removed.  Intramedullary alignment was used on the femur, hole was drilled up, 5 degrees was used, and the initial distal cut was  made after pinning and then advancing to take 2 mm more of bone.  ACL was sacrificed for mobility, and the anterior and posterior cuts were made after a #5 trial showed that it just skimmed the surface of the cortex.  There was a 0.5 mm notch on the anterior cortex only on the lateral aspect.  The cut could have been made farther proximally, but there was plenty of space for the femoral component to fit.  Next, the intramedullary rod was inserted down the tibia.  External rod was used to confirm that it was medial to the tibial tubercle and fell directly over the middle of the ankle joint.  AP cut was made on the tibia, taking more bone lateral than medial due to the patients preoperative deformity.  There was still inadequate gap for the component.  It was tight, and an additional 4 mm were taken, which was satisfactory.  Anterior and posterior cuts were made on the femoral side, chamfer cuts were made, trials were inserted, appropriate rotation was marked and the knee in full extension, flexed to 130 degrees.  There were some posterior osteophytes and pieces of bone both behind the tibia as well as some overhanging spurs posteriorly on the femur, which were removed with the 3/4 inch curved osteotome.  Palpation showed no remaining distal remnants.  The popliteus was spared.  After rotation had been marked, keel hole was made up to a cement #5.  Pulsatile lavage was made, trials were inserted.  There was excellent tracking.  The patella was resurfaced by  hand, removing 10 mm of bone, going from the facet to facet.  A #5 was appropriate size.  Cement was mixed while pulsatile lavage was used.  A McCale was inserted.  Tibial Osteonics #5 component was cemented in place, excessive cement was removed around the posterior aspect.  Femoral press-fit component was inserted, a 10 mm polyethylene spacer, and the patella was inserted, tightening the clamp down and removing excessive cement.  After  15 minutes, cement was hardened. Both gutters were checked.  There were no remaining cement pieces in either lateral gutter, and the polyethylene component was completely inserted down in the back as planned.  After the tourniquet was deflated, pressure was held, bleeders were controlled with Bovie electrocautery.  Ethibond #1 suture was used for closure of the true retinaculum.  The knee was flexed a few times during the closure to make sure all tissue was lining up appropriately.  The superficial retinaculum was closed with 2-0 Vicryl, 2-0 in the subcutaneous tissue, with skin staple closure, Marcaine infiltration in the skin and also in the knee, a total of 30 cc.  Xeroform, 4 x 4s, ABD, Webril, Ace wrap, and knee immobilizer was applied postoperatively.  Instrument count and needle count were correct. Dictated by:   Veverly Fells Ophelia Charter, M.D. Attending Physician:  Jacki Cones DD:  11/14/00 TD:  11/15/00 Job: [id number]

## 2010-06-05 NOTE — Op Note (Signed)
[name], [name]                 ACCOUNT NO.:  [id number]   MEDICAL RECORD NO.:  [id number]          PATIENT TYPE:  AMB   LOCATION:  DAY                          FACILITY:  [facility]   PHYSICIAN:  Leonie Man, M.D.   DATE OF BIRTH:  1945/07/11   DATE OF PROCEDURE:  07/16/2004  DATE OF DISCHARGE:                                 OPERATIVE REPORT   PREOPERATIVE DIAGNOSIS:  Grade 4 hemorrhoidal disease.   POSTOPERATIVE DIAGNOSIS:  Grade 4 hemorrhoidal disease.   PROCEDURE:  Stapled hemorrhoidectomy (PPH).   SURGEON:  Leonie Man, M.D.   ASSISTANT:  OR technician.   ANESTHESIA:  General.   SPECIMENS TO PATHOLOGY:  Hemorrhoids.   ESTIMATED BLOOD LOSS:  Minimal.   COMPLICATIONS:  None apparent.   DISPOSITION:  The patient removed to the PACU in excellent condition.   NOTE:  The patient is a 65 year old woman with a history of recurrent  bleeding and prolapse and pain from her hemorrhoids. She described what  seems to be persistent constipation. She has been recently evaluated by a  flexible sigmoidoscopy by Dr. Evette Cristal.  The patient has a significant amount  of internal hemorrhoids as well as some diverticulosis.  She comes to the  operating room today for a stapled hemorrhoidectomy because of her  hemorrhoids.   DESCRIPTION OF PROCEDURE:  Following the induction of satisfactory general  anesthesia, the patient was turned into the prone jackknife position, and  the perianal tissues were prepped and draped to be included in a sterile  operative field.  I dilated the anus up to three fingerbreadths and  infiltrated the perianal region with 0.25% Marcaine with epinephrine. The  operating scope was placed into the anal canal, and a pursestring suture of  2-0 Prolene was placed approximately 4-4 cm above the dentate line. The  pursestring suture was tested.  There was no inclusion of the rectovaginal  septum, and the pursestring was intact. [facility] stapler was then inserted so  as the anvil was above the pursestring.  The pursestring suture was drawn  taut and tied. The hemorrhoids were then pulled up into the stapling device  for approximately a total 4 cm, and the stapling device was held for  approximately 1 minute for edema fluid to be squeezed out of the staple  line. The stapling device was then fired and removed, and the suture line  was noted to be intact. A complete circle of hemorrhoidal tissue has been  removed.  The staple line was then  inspected for hemostasis. Additional bleeding points were treated with  electrocautery. Around the suture line, there was a Gelfoam pack that was  placed. A sterile dressing was then applied. The anesthetic was reversed  after sponge and instrument counts were been noted to be correct. The  patient was removed to the PACU in excellent condition.       PB/MEDQ  D:  07/16/2004  T:  07/16/2004  Job:  [id number]   cc:   Osvaldo Shipper. Spruill, M.D.  [address] [address]  [place]  [place] [address]  Fax: [phone]

## 2010-06-05 NOTE — Discharge Summary (Signed)
[place]. [facility]  Patient:    [name], [name] Visit Number: [id number] MRN: [id number]          Service Type: [facility] Location: 4100 [id number] Attending Physician:  Herold Harms Dictated by:   Veverly Fells [name], M.D. Admit Date:  11/17/2000 Discharge Date: 11/23/2000                             Discharge Summary  FINAL DIAGNOSES: 1. 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 3. History of asthma. 4. Hypertension. 5. Long term use of anticoagulants.  HISTORY OF PRESENT ILLNESS:  The patient is a 65 year old black female with a history of right knee osteoarthritis and chronic pain.  She has had no response with conservative treatment.  Significant decrease in daily activities.  Her pain has progressively gotten worse over the past few years. Previous x-rays showed a right grade III and IV chondromalacia and bilateral tricompartmental osteoarthritis.  The patient stated that she has a history of coronary vasospasms, and Dr. Ophelia [name] had a phone conversation with Dr. Shana [name], the patients cardiologist, who faxed a surgical clearance note.  LABORATORY DATA:  Preoperative labs on November 10, 2000, showed a white blood cell count of 11.7, RBC 4.67, hemoglobin 13.0, hematocrit 40.5, platelets 400. PT 13.3, INR 1.0, PTT 30.  Sodium 133, potassium 3.9, chloride 102, CO2 26, glucose 107, BUN 8, creatinine 0.8.  Urinalysis was negative.  EKG showed a normal sinus rhythm.  HOSPITAL COURSE:  The patient was taken to the operating room on November 06, 2000, for right knee osteoarthritis.  She underwent a right total knee arthroplasty.  Surgeon was [employer]. [name], M.D.  Assistant was Dimple Casey, P.A.-C.  Anesthesia was general.  There was less than 100 cc estimated blood loss.  There were no complications, and condition after the surgery was stable.  She was transferred to the orthopedic floor.  On November 15, 2000, INR was 1.2, hemoglobin  10.8.  Her IV was hep-locked and dressing changed. Sciatic nerve was intact.  Pharmacy protocol Coumadin was started.  Physical therapy evaluated the patient and felt that she would benefit from physical therapy on CIR.  On November 16, 2000, order was given for out of bed to recliner assistance.  She was using the CPM machine.  Rehabilitation case management evaluated on November 16, 2000.  On November 16, 2000, hemoglobin 10.7, hematocrit 32.6, INR 1.8.  On November 17, 2000, the patient ambulated down hall without complications.  INR was 2.1, and hemoglobin 10.3.  CPM only to 50 degrees.  On November 17, 2000, the patient was discharged and admitted to rehab.  CONDITION ON DISCHARGE:  Good and stable.  DISPOSITION:  Discharged to rehab.  DISCHARGE MEDICATIONS: 1. Atenolol 25 mg p.o. q.d. 2. Climara 0.05 mg patch every week. 3. Zocor 10 mg p.o. q.d. 4. Diltiazem CD 240 mg p.o. q.d. 5. Albuterol inhaler p.r.n. 6. Nitrolingual spray p.r.n. 7. Vicodin one or two tabs p.o. q.6-8h. p.r.n. pain. 8. Coumadin per pharmacy protocol. 9. OxyContin 20 mg one tab p.o. b.i.d.  WOUND CARE:  Dressing changes p.r.n.  ACTIVITY:  Weightbearing as tolerated.  Work on strengthening and range of motion of the right knee.  FOLLOWUP:  She will follow up in the office in one week after her discharge from rehab, and she will call us at [phone] if there are any problems or complications before that visit. Dictated by:  Mark C. [name], M.D. Attending Physician:  Herold Harms DD:  11/29/00 TD:  11/29/00 Job: [id number]

## 2010-06-05 NOTE — Discharge Summary (Signed)
[place]. [facility]  Patient:    [name], [name] Visit Number: [id number] MRN: [id number]          Service Type: [facility] Location: 4100 [id number] Attending Physician:  Herold Harms Dictated by:   Mcarthur Rossetti. Angiulli, P.A. Admit Date:  11/17/2000 Discharge Date: 11/23/2000   CC:         Mark C. Ophelia Charter, M.D.  Osvaldo Shipper. Spruill, M.D.   Discharge Summary  DISCHARGE DIAGNOSES: 1. Right total knee replacement November 14, 2000. 2. Anemia. 3. Hypertension. 4. Hyperlipidemia. 5. Asthma. 6. Left total hip replacement x 2.  HISTORY OF PRESENT ILLNESS:  A 65 year old black female with history of left total hip replacement in 1993 and 1997, who was admitted November 14, 2000, with increased right knee pain with x-ray showing advanced arthritis, no relief with conservative care. She underwent a right total knee replacement on November 14, 2000, per Mark C. Ophelia Charter, M.D.  She was placed on Coumadin for deep venous thrombosis prophylaxis, partial weightbearing, postoperative pain management.  Minimal assistance for ambulation.  Latest INR 2.1, hemoglobin 10.3.  She was admitted for a comprehensive rehab program.  PAST MEDICAL HISTORY:  See discharge diagnoses.  PAST SURGICAL HISTORY:  Left total hip replacement x 2 and hysterectomy.  ALLERGIES:  ASPIRIN, CONTRAST DYE, and TYLOX.  MEDICATIONS PRIOR TO ADMISSION:  Tenormin, Climara, Zocor, Cardizem, albuterol inhaler, and nitroglycerin spray.  PRIMARY M.D.:  Osvaldo Shipper. Spruill, M.D.  SOCIAL HISTORY:  No alcohol or tobacco.  She lives alone in [place], [place].  She is retired.  She has an aide three and a half hours and a daughter to come in and help stay temporarily. She was independent with a cane prior to admission.  One level home.  Son in [place], [place], works day shift.  HOSPITAL COURSE:  The patient did well while in rehabilitation services with therapies initiated on a b.i.d. basis.   The following issues were followed during the patients rehab course.  Pertaining to Ms. Prices right total knee replacement, remained stable.  Surgical site healing nicely.  No signs of infection.  She was ambulating with a walker, partial weightbearing, using CPM machine as advised.  She continued on Coumadin for deep venous thrombosis prophylaxis.  Venous Doppler studies prior to discharge were negative.  She would complete Coumadin protocol.  A home health nurse would be arranged as well as home health therapies.  Blood pressures remained controlled with no orthostatic changes as she continued on home medications of Cardizem and Tenormin.  Postoperative anemia with latest hemoglobin 10, hematocrit 30.4. She continued on iron supplement.  She had a mild history of asthma.  She used an albuterol inhaler on a very limited basis as needed.  She would continue on her Zocor for hyperlipidemia.  Overall, for her functional mobility, she was minimal assistance for bed mobility, modified independence for ambulation. CPM machine 0 to 77 degrees.  Modified independence for activities of daily living.  She would be discharged home on November 23, 2000, with home health physical, occupational therapy and a nurse.  Latest labs showed an INR of 3.6.  Hemoglobin 10, hematocrit 30. Sodium 137, potassium 3.3 with supplement, BUN 6, creatinine 0.7.  DISCHARGE MEDICATIONS: 1. Zocor 10 mg daily. 2. Cardizem 240 mg daily. 3. Tenormin 25 mg daily. 4. Climara patch change every Friday. 5. Trinsicon twice daily. 6. Nasacort spray daily. 7. Claritin 10 mg daily. 8. Vicodin as needed for pain. 9. Albuterol  inhaler as needed.  ACTIVITY:  Partial weightbearing with walker.  DIET:  Regular.  WOUND CARE:  Cleanse incision daily with warm soap and water.  SPECIAL INSTRUCTIONS:  Home health physical therapy, occupational therapy, and home health nurse for prothrombin time to complete Coumadin  protocol.  FOLLOW-UP:  She should follow up with Dr. Ophelia Charter, to call for appointment; Dr. Shana Chute for medical management. Dictated by:   Mcarthur Rossetti. Angiulli, P.A. Attending Physician:  Herold Harms DD:  11/22/00 TD:  11/23/00 Job: [id number]

## 2010-06-05 NOTE — Op Note (Signed)
[facility]  Patient:    [name], [name] Visit Number: [id number] MRN: [id number]          Service Type: [facility] Location: [facility] Attending Physician:  Lindaann Slough Dictated by:   Lindaann Slough, M.D. Proc. Date: 05/30/01 Admit Date:  05/30/2001   CC:         Osvaldo Shipper. Spruill, M.D.   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  PROCEDURE: 1. Cystoscopy. 2. Urethral dilation.  SURGEON:  Lindaann Slough, M.D.  ANESTHESIA:  General.  INDICATION:  The patient is a 65 year old female who has been complaining of frequency, hesitancy, voiding small amount of urine at a time and _______ discomfort. She was treated with Macrobid without any improvement. She is scheduled for cystoscopy.  DESCRIPTION OF PROCEDURE:  Under general anesthesia, the patient was prepped and draped and placed in the dorsal lithotomy position. A #22 Wappler cystoscope was inserted in the bladder. The bladder mucosa is normal. There is no stone or tumor in the bladder. The ureteral orifices are in normal position and shape with clear efflux. There is no evidence of submucosal hemorrhage. There is a squamous metaplasia the trigone. The cystoscope was then removed. The urethra was then dilated with a #20 [place].  The patient tolerated the procedure well and left the OR in satisfactory condition to postanesthesia care unit. Dictated by:   Lindaann Slough, M.D. Attending Physician:  Lindaann Slough DD:  05/30/01 TD:  05/31/01 Job: [id number]

## 2010-06-05 NOTE — Discharge Summary (Signed)
[place]                 ACCOUNT NO.:  [id number]   MEDICAL RECORD NO.:  [id number]          PATIENT TYPE:  INP   LOCATION:  3733                         FACILITY:  [facility]   PHYSICIAN:  Osvaldo Shipper. Spruill, M.D.DATE OF BIRTH:  [birth year]   DATE OF ADMISSION:  02/13/2006  DATE OF DISCHARGE:  02/16/2006                               DISCHARGE SUMMARY   DISCHARGE DIAGNOSES:  1. Chest pain.  2. Body mass index 40 and over.  3. Hip joint replacement (status).  4. Old myocardial infarction.   [name] is a 65 year old patient who presented to the [facility] on February 13, 2006.  It is of note that [name] has a  history of recurrent chest pain.  She presented to the hospital with  this same problem of left chest pain radiating to the neck and left arm.  She was treated with IV nitroglycerin in the emergency department and  received some relief.  There was no history of nausea, vomiting or  shortness of breath related to this episode.  During the initial  examination which was done by Dr. Orpah Cobb, the patient was noted to  have stable vital signs.  There were no significant changes on the  physical examination.  The patient was subsequently admitted to rule out  MI and to further evaluate her cardiac status.   The patient was admitted to the medical service, was placed on telemetry  and was observed closely.  She was seen by the pharmacy for heparin  protocol (rule out MI).   On February 14, 2006, she received a stress study which revealed no  evidence of pharmacologically induced myocardial ischemia.  There was  normal wall motion with a calculated ejection fraction of 70%.  Serial  cardiac enzymes were negative.   On February 15, 2006, the chest pain seemed to be improving.  There was  question as to whether or not this could be related to some coronary  artery spasm, and on February 16, 2006, there continued to be improvement  in the chest pain status, and  the patient was discharged home, having  received maximum benefit from this hospitalization.   MEDICATIONS AT DISCHARGE:  1. Advair 250/50 one puff b.i.d.  2. Xopenex one puff b.i.d.  3. Zyrtec one tablet b.i.d.  4. Plavix 75 mg daily.  5. Amitriptyline 50 mg at bedtime.  6. Diltiazem 240 mg daily.  7. Imdur 60 mg daily.  8. Lipitor 10 mg daily.  9. Reglan 10 mg a.c. and h.s.  10.Coreg 12.5 b.i.d.  11.Nexium 40 mg b.i.d.   FOLLOWUP:  The patient is to be seen in the office in 2 weeks; sooner if  any changes, problems or concerns.      Ivery Quale, P.A.      Osvaldo Shipper. Spruill, M.D.  Electronically Signed    HB/MEDQ  D:  07/19/2006  T:  07/19/2006  Job:  [id number]

## 2010-06-05 NOTE — Op Note (Signed)
NAMEAIMIE, WAGMAN                 ACCOUNT NO.:  [id number]   MEDICAL RECORD NO.:  [id number]          PATIENT TYPE:  AMB   LOCATION:  ENDO                         FACILITY:  [facility]   PHYSICIAN:  Graylin Shiver, M.D.   DATE OF BIRTH:  [birth year]   DATE OF PROCEDURE:  03/04/2004  DATE OF DISCHARGE:                                 OPERATIVE REPORT   PROCEDURE:  Flexible sigmoidoscopy.   INDICATIONS:  Rectal bleeding.   Informed consent was obtained after explanation of the risks of bleeding,  infection and perforation.   PREMEDICATION:  The procedure was done immediately after an EGD with an  additional 25 mcg of fentanyl given.   PROCEDURE:  With the patient in the left lateral decubitus position a rectal  exam was performed and no masses were felt. The Olympus gastroscope was  inserted into the rectum and advanced into the sigmoid colon up to 40 cm.  The lower descending colon and sigmoid revealed diverticulosis. The rectum  looked normal. The scope was retroflexed in the rectum, there were internal  hemorrhoids. The scope was straightened and brought out.  She tolerated the  procedure well without complications.   IMPRESSION:  1.  Diverticulosis.  2.  Internal hemorrhoids.   I believe that the patient's rectal bleeding is secondary to internal  hemorrhoids.      SFG/MEDQ  D:  03/04/2004  T:  03/04/2004  Job:  [id number]   cc:   Osvaldo Shipper. Spruill, M.D.  [address] [address]  [place]  [place] [address]  Fax: [phone]

## 2010-08-05 ENCOUNTER — Encounter (HOSPITAL_COMMUNITY)
Admission: RE | Admit: 2010-08-05 | Discharge: 2010-08-05 | Disposition: A | Discharge status: Home / Self Care | Payer: Medicare Other | Source: Ambulatory Visit | Attending: Neurosurgery | Admitting: Neurosurgery

## 2010-08-05 LAB — ABO/RH: ABO/RH(D): A POS

## 2010-08-05 LAB — CBC
HCT: 36.4 % (ref 36.0–46.0)
MCH: 27.9 pg (ref 26.0–34.0)
MCV: 85.2 fL (ref 78.0–100.0)
RBC: 4.27 MIL/uL (ref 3.87–5.11)
RDW: 15.2 % (ref 11.5–15.5)
WBC: 14 10*3/uL — ABNORMAL HIGH (ref 4.0–10.5)

## 2010-08-05 LAB — BASIC METABOLIC PANEL
CO2: 23 mEq/L (ref 19–32)
Glucose, Bld: 91 mg/dL (ref 70–99)
Potassium: 3.9 mEq/L (ref 3.5–5.1)
Sodium: 142 mEq/L (ref 135–145)

## 2010-08-05 LAB — TYPE AND SCREEN: Antibody Screen: NEGATIVE

## 2010-08-06 ENCOUNTER — Other Ambulatory Visit (HOSPITAL_COMMUNITY): Payer: Self-pay | Admitting: Cardiology

## 2010-08-06 DIAGNOSIS — Z01818 Encounter for other preprocedural examination: Secondary | ICD-10-CM

## 2010-08-07 ENCOUNTER — Encounter (HOSPITAL_COMMUNITY)
Admission: RE | Admit: 2010-08-07 | Discharge: 2010-08-07 | Disposition: A | Discharge status: Home / Self Care | Payer: Medicare Other | Source: Ambulatory Visit | Attending: Cardiology | Admitting: Cardiology

## 2010-08-07 ENCOUNTER — Encounter (HOSPITAL_COMMUNITY): Admission: RE | Admit: 2010-08-07 | Payer: Medicare Other | Source: Ambulatory Visit

## 2010-08-07 DIAGNOSIS — Z01818 Encounter for other preprocedural examination: Secondary | ICD-10-CM

## 2010-08-07 MED ORDER — TECHNETIUM TC 99M TETROFOSMIN IV KIT
30.0000 | PACK | Freq: Once | INTRAVENOUS | Status: AC | PRN
  Administered 2010-08-07: 30 via INTRAVENOUS

## 2010-08-07 MED ORDER — TECHNETIUM TC 99M TETROFOSMIN IV KIT
10.0000 | PACK | Freq: Once | INTRAVENOUS | Status: AC | PRN
  Administered 2010-08-07: 10 via INTRAVENOUS

## 2010-08-10 ENCOUNTER — Inpatient Hospital Stay (HOSPITAL_COMMUNITY): Payer: Medicare Other

## 2010-08-10 ENCOUNTER — Inpatient Hospital Stay (HOSPITAL_COMMUNITY)
Admission: RE | Admit: 2010-08-10 | Discharge: 2010-08-16 | DRG: 460 | Disposition: A | Discharge status: Home Health | Payer: Medicare Other | Source: Ambulatory Visit | Attending: Neurosurgery | Admitting: Neurosurgery

## 2010-08-10 DIAGNOSIS — M48062 Spinal stenosis, lumbar region with neurogenic claudication: Secondary | ICD-10-CM | POA: Diagnosis present

## 2010-08-10 DIAGNOSIS — M4310 Spondylolisthesis, site unspecified: Principal | ICD-10-CM | POA: Diagnosis present

## 2010-08-10 DIAGNOSIS — M51379 Other intervertebral disc degeneration, lumbosacral region without mention of lumbar back pain or lower extremity pain: Secondary | ICD-10-CM | POA: Diagnosis present

## 2010-08-10 DIAGNOSIS — M5137 Other intervertebral disc degeneration, lumbosacral region: Secondary | ICD-10-CM | POA: Diagnosis present

## 2010-08-10 LAB — GLUCOSE, CAPILLARY

## 2010-08-11 LAB — CBC
HCT: 31 % — ABNORMAL LOW (ref 36.0–46.0)
Hemoglobin: 9.8 g/dL — ABNORMAL LOW (ref 12.0–15.0)
MCHC: 31.6 g/dL (ref 30.0–36.0)
MCV: 85.9 fL (ref 78.0–100.0)
RDW: 15.2 % (ref 11.5–15.5)

## 2010-08-11 LAB — GLUCOSE, CAPILLARY
Glucose-Capillary: 110 mg/dL — ABNORMAL HIGH (ref 70–99)
Glucose-Capillary: 123 mg/dL — ABNORMAL HIGH (ref 70–99)
Glucose-Capillary: 129 mg/dL — ABNORMAL HIGH (ref 70–99)

## 2010-08-11 LAB — BASIC METABOLIC PANEL
BUN: 8 mg/dL (ref 6–23)
Creatinine, Ser: 0.77 mg/dL (ref 0.50–1.10)
GFR calc Af Amer: >60 mL/min (ref >60)
GFR calc non Af Amer: >60 mL/min (ref >60)
Glucose, Bld: 122 mg/dL — ABNORMAL HIGH (ref 70–99)
Potassium: 4.1 mEq/L (ref 3.5–5.1)

## 2010-08-11 NOTE — Op Note (Signed)
[name], BLOMQUIST NO.:  [id number]  MEDICAL RECORD NO.:  [id number]  LOCATION:  3033                         FACILITY:  [facility]  PHYSICIAN:  Cristi Loron, M.D.DATE OF BIRTH:  1945/10/02  DATE OF PROCEDURE:  08/10/2010 DATE OF DISCHARGE:                              OPERATIVE REPORT   BRIEF HISTORY:  The patient is a 65 year old black female who has suffered from back, hip, and leg pain consistent with neurogenic claudication.  She has failed medical management and was worked up with a lumbar MRI which demonstrated the patient has spondylolisthesis with facet arthropathy, disk degeneration, etc. at L4-L5.  I discussed various treatment options with the patient including surgery.  She has weighed the risks, benefits, and alternatives of surgery, decided to proceed with an L4-L5 decompression, instrumentation, and fusion.  PREOPERATIVE DIAGNOSES:  L4-L5 grade 1 acquired spondylolisthesis, spinal stenosis, facet arthropathy, disk degeneration, lumbar radiculopathy, lumbago.  POSTOPERATIVE DIAGNOSES:  L4-L5 grade 1 acquired spondylolisthesis, spinal stenosis, facet arthropathy, disk degeneration, lumbar radiculopathy, lumbago.  PROCEDURES:  Bilateral L4 laminotomies, foraminotomies to decompress the bilateral L4 and L5 nerve roots (the work required to do this was in addition to work required to do the posterior lumbar fusion because of the patient's severe facet arthropathy and requiring wide laminotomies and foraminotomies to decompress the neural elements).  L4-L5 posterior lumbar interbody fusion with local morselized autograft bone and Actifuse and bone graft extender; insertion of L4-L5 interbody prosthesis (Globus PEEK interbody prosthesis); L4-L5 posterior nonsegmental instrumentation with Globus titanium pedicle screws and rods; L4-L5 posterolateral arthrodesis with local morselized autograft bone and Vitoss bone graft extender.  SURGEON:   Cristi Loron, MD  ASSISTANT:  Coletta Memos, MD  ANESTHESIA:  General endotracheal.  ESTIMATED BLOOD LOSS:  200 mL.  SPECIMENS:  None.  DRAINS:  None.  COMPLICATIONS:  None.  DESCRIPTION OF PROCEDURE:  The patient was brought to the operating room by the Anesthesia team.  General endotracheal anesthesia was induced. The patient was turned into prone position on a Wilson frame.  Her lumbosacral region was then prepared with Betadine scrub and Betadine solution.  Sterile drapes were applied.  I then injected the area to be incised with Marcaine with epinephrine solution.  I used a scalpel to make a linear midline incision over the L4-L5 interspace.  I used electrocautery to perform a bilateral subperiosteal dissection, exposing the spinous process and lamina of L3, L4, and L5.  We obtained intraoperative radiograph to confirm location and inserted a bursa tract retractor for exposure.  We then did decompression by used a high-speed drill to perform bilateral L4 laminotomies.  I widened these laminotomies with Kerrison punch, removing the L4-L5 ligamentum flavum and performing wide foraminotomies about the bilateral L4 and L5 nerve roots.  This completed the decompression.  I now turned attention to posterior lumbar interbody fusion.  I incised the L4-L5 intervertebral disk with a 15 blade scalpel bilaterally.  I performed a bilateral diskectomy using pituitary forceps.  We prepared the vertebral endplates with the curettes and then used a trial spacer, then determined to use a 12 x 26-mm interbody prosthesis bilaterally. We prefilled the prosthesis with a  combination of local morselized autograft bone that we obtained during the decompression as well as Actifuse bone graft extender.  We inserted the prosthesis into the L4-L5 interspace bilaterally, of course, after retracting the neural structures out of Harm's way.  There was a good snug fit of the prosthesis  bilaterally.  We filled the remainder of the disk space with Actifuse and local autograft bone, completing the posterior lumbar interbody fusion.  We now turned our attention to the instrumentation.  Under fluoroscopic guidance, we cannulated the bilateral L4 and L5 pedicles with the bone probe.  We tapped pedicles with a 5.5-mm tap, we then inserted 6.5 x 45- mm pedicle screws bilaterally into the L4 and L5 pedicles under fluoroscopic guidance.  I should mention that prior to placing the pedicle screws, we probed inside the tapped pedicles where a cortical breech is.  We got a good bony purchase.  We then palpated along the medial aspect of the bilateral L4 and L5 pedicles and noticed no cortical breeches in the nerve roots which were not injured.  We then connected the unilateral pedicle screws with lordotic rod.  We compressed construct and then secured the rod in place with the caps which we tightened appropriately.  This completed the instrumentation.  We now turned our attention to posterolateral arthrodesis.  I used a high-speed drill to decorticate the remainder of the L4-L5 facet pars, transverse process, etc.  I then laid a combination of local autograft bone and Vitoss bone graft extender over these decorticated posterolateral structures.  This completed the posterolateral arthrodesis.  We then inspected the thecal sac and bilateral L4 and L5 nerve roots, noted they were well decompressed.  We obtained hemostasis using bipolar electrocautery.  We irrigated the wound out with bacitracin solution. We then removed the retractor and then reapproximated the patient's thoracolumbar fascia with interrupted #1 Vicryl suture, subcutaneous tissue with interrupted 2-0 Vicryl suture, and the skin with Steri- Strips and Benzoin.  The wound was then coated with bacitracin ointment. A sterile dressing was applied, the drapes were removed, and the patient was subsequently returned to  supine position where she was extubated by the Anesthesia team and transported to the postanesthesia care unit in stable condition.  All sponge, instrument, and needle counts were correct at the end of this case.     Cristi Loron, M.D.     JDJ/MEDQ  D:  08/10/2010  T:  08/11/2010  Job:  [id number]  Electronically Signed by Tressie Stalker M.D. on 08/11/2010 06:29:04 PM

## 2010-08-12 LAB — GLUCOSE, CAPILLARY
Glucose-Capillary: 109 mg/dL — ABNORMAL HIGH (ref 70–99)
Glucose-Capillary: 129 mg/dL — ABNORMAL HIGH (ref 70–99)

## 2010-08-13 LAB — GLUCOSE, CAPILLARY
Glucose-Capillary: 110 mg/dL — ABNORMAL HIGH (ref 70–99)
Glucose-Capillary: 113 mg/dL — ABNORMAL HIGH (ref 70–99)
Glucose-Capillary: 88 mg/dL (ref 70–99)

## 2010-08-14 LAB — GLUCOSE, CAPILLARY
Glucose-Capillary: 67 mg/dL — ABNORMAL LOW (ref 70–99)
Glucose-Capillary: 121 mg/dL — ABNORMAL HIGH (ref 70–99)
Glucose-Capillary: 108 mg/dL — ABNORMAL HIGH (ref 70–99)

## 2010-08-15 LAB — GLUCOSE, CAPILLARY
Glucose-Capillary: 101 mg/dL — ABNORMAL HIGH (ref 70–99)
Glucose-Capillary: 113 mg/dL — ABNORMAL HIGH (ref 70–99)

## 2010-08-16 LAB — GLUCOSE, CAPILLARY
Glucose-Capillary: 115 mg/dL — ABNORMAL HIGH (ref 70–99)
Glucose-Capillary: 142 mg/dL — ABNORMAL HIGH (ref 70–99)

## 2010-08-31 ENCOUNTER — Emergency Department (HOSPITAL_COMMUNITY)
Admission: EM | Admit: 2010-08-31 | Discharge: 2010-08-31 | Disposition: A | Discharge status: Home / Self Care | Payer: Medicare Other | Source: Home / Self Care | Attending: Emergency Medicine | Admitting: Emergency Medicine

## 2010-08-31 DIAGNOSIS — Y838 Other surgical procedures as the cause of abnormal reaction of the patient, or of later complication, without mention of misadventure at the time of the procedure: Secondary | ICD-10-CM | POA: Insufficient documentation

## 2010-08-31 DIAGNOSIS — M129 Arthropathy, unspecified: Secondary | ICD-10-CM | POA: Insufficient documentation

## 2010-08-31 DIAGNOSIS — K219 Gastro-esophageal reflux disease without esophagitis: Secondary | ICD-10-CM | POA: Insufficient documentation

## 2010-08-31 DIAGNOSIS — I252 Old myocardial infarction: Secondary | ICD-10-CM | POA: Insufficient documentation

## 2010-08-31 DIAGNOSIS — T8140XA Infection following a procedure, unspecified, initial encounter: Secondary | ICD-10-CM | POA: Insufficient documentation

## 2010-08-31 DIAGNOSIS — E7800 Pure hypercholesterolemia, unspecified: Secondary | ICD-10-CM | POA: Insufficient documentation

## 2010-08-31 DIAGNOSIS — E119 Type 2 diabetes mellitus without complications: Secondary | ICD-10-CM | POA: Insufficient documentation

## 2010-09-01 LAB — GLUCOSE, CAPILLARY: Glucose-Capillary: 110 mg/dL — ABNORMAL HIGH (ref 70–99)

## 2010-09-03 ENCOUNTER — Inpatient Hospital Stay (HOSPITAL_COMMUNITY)
Admission: RE | Admit: 2010-09-03 | Discharge: 2010-09-09 | DRG: 863 | Disposition: A | Discharge status: Skilled Nursing Facility | Payer: Medicare Other | Source: Ambulatory Visit | Attending: Neurosurgery | Admitting: Neurosurgery

## 2010-09-03 DIAGNOSIS — I10 Essential (primary) hypertension: Secondary | ICD-10-CM | POA: Diagnosis present

## 2010-09-03 DIAGNOSIS — B9689 Other specified bacterial agents as the cause of diseases classified elsewhere: Secondary | ICD-10-CM | POA: Diagnosis present

## 2010-09-03 DIAGNOSIS — E7800 Pure hypercholesterolemia, unspecified: Secondary | ICD-10-CM | POA: Diagnosis present

## 2010-09-03 DIAGNOSIS — Y838 Other surgical procedures as the cause of abnormal reaction of the patient, or of later complication, without mention of misadventure at the time of the procedure: Secondary | ICD-10-CM | POA: Diagnosis present

## 2010-09-03 DIAGNOSIS — T8140XA Infection following a procedure, unspecified, initial encounter: Principal | ICD-10-CM | POA: Diagnosis present

## 2010-09-03 DIAGNOSIS — M129 Arthropathy, unspecified: Secondary | ICD-10-CM | POA: Diagnosis present

## 2010-09-03 DIAGNOSIS — Z981 Arthrodesis status: Secondary | ICD-10-CM

## 2010-09-03 DIAGNOSIS — E119 Type 2 diabetes mellitus without complications: Secondary | ICD-10-CM | POA: Diagnosis present

## 2010-09-03 DIAGNOSIS — K219 Gastro-esophageal reflux disease without esophagitis: Secondary | ICD-10-CM | POA: Diagnosis present

## 2010-09-03 DIAGNOSIS — I252 Old myocardial infarction: Secondary | ICD-10-CM

## 2010-09-03 LAB — CBC
HCT: 30.8 % — ABNORMAL LOW (ref 36.0–46.0)
MCH: 26.9 pg (ref 26.0–34.0)
MCV: 85.3 fL (ref 78.0–100.0)
MCV: 85.5 fL (ref 78.0–100.0)
Platelets: 411 10*3/uL — ABNORMAL HIGH (ref 150–400)
RBC: 3.51 MIL/uL — ABNORMAL LOW (ref 3.87–5.11)
RDW: 15.4 % (ref 11.5–15.5)
WBC: 9.4 10*3/uL (ref 4.0–10.5)
WBC: 11.6 10*3/uL — ABNORMAL HIGH (ref 4.0–10.5)

## 2010-09-03 LAB — GLUCOSE, CAPILLARY

## 2010-09-03 LAB — BASIC METABOLIC PANEL
BUN: 8 mg/dL (ref 6–23)
Chloride: 106 mEq/L (ref 96–112)
Creatinine, Ser: 0.66 mg/dL (ref 0.50–1.10)
GFR calc Af Amer: >60 mL/min (ref >60)

## 2010-09-03 LAB — SEDIMENTATION RATE: Sed Rate: 28 mm/hr — ABNORMAL HIGH (ref 0–22)

## 2010-09-04 DIAGNOSIS — T8140XA Infection following a procedure, unspecified, initial encounter: Secondary | ICD-10-CM

## 2010-09-04 LAB — GLUCOSE, CAPILLARY
Glucose-Capillary: 89 mg/dL (ref 70–99)
Glucose-Capillary: 76 mg/dL (ref 70–99)

## 2010-09-05 LAB — GLUCOSE, CAPILLARY
Glucose-Capillary: 109 mg/dL — ABNORMAL HIGH (ref 70–99)
Glucose-Capillary: 123 mg/dL — ABNORMAL HIGH (ref 70–99)
Glucose-Capillary: 108 mg/dL — ABNORMAL HIGH (ref 70–99)

## 2010-09-06 LAB — GLUCOSE, CAPILLARY: Glucose-Capillary: 85 mg/dL (ref 70–99)

## 2010-09-07 LAB — VANCOMYCIN, TROUGH: Vancomycin Tr: 14.6 ug/mL (ref 10.0–20.0)

## 2010-09-07 LAB — GLUCOSE, CAPILLARY
Glucose-Capillary: 97 mg/dL (ref 70–99)
Glucose-Capillary: 102 mg/dL — ABNORMAL HIGH (ref 70–99)
Glucose-Capillary: 108 mg/dL — ABNORMAL HIGH (ref 70–99)

## 2010-09-08 LAB — GLUCOSE, CAPILLARY
Glucose-Capillary: 97 mg/dL (ref 70–99)
Glucose-Capillary: 94 mg/dL (ref 70–99)
Glucose-Capillary: 85 mg/dL (ref 70–99)

## 2010-09-08 LAB — ANAEROBIC CULTURE

## 2010-09-09 LAB — GLUCOSE, CAPILLARY
Glucose-Capillary: 110 mg/dL — ABNORMAL HIGH (ref 70–99)
Glucose-Capillary: 115 mg/dL — ABNORMAL HIGH (ref 70–99)

## 2010-09-14 NOTE — Discharge Summary (Signed)
  NAMECARMIN, ALVIDREZ NO.:  [id number]  MEDICAL RECORD NO.:  [id number]  LOCATION:  3033                         FACILITY:  [facility]  PHYSICIAN:  Cristi Loron, M.D.DATE OF BIRTH:  Apr 12, 1945  DATE OF ADMISSION:  08/10/2010 DATE OF DISCHARGE:  08/16/2010                              DISCHARGE SUMMARY   BRIEF HISTORY:  The patient is a 65 year old black female who has suffered from back, hip and leg pain consistent with neurogenic claudication.  The patient has failed medical management, worked up with a lumbar MRI, which demonstrated the patient to have a spondylolisthesis with facet arthropathy, disk degeneration, etc. at the L4-5.  I discussed the various treatment options with the patient including surgery.  The patient has weighed the risks, benefits and alternatives of surgery and decided to proceed with an L4-5 decompression, instrumentation, and fusion.  For further details of this admission, please refer to typed history and physical.  HOSPITAL COURSE:  I admitted the patient to [facility] on August 10, 2010.  On day of admission, I performed an L4-5 decompression, instrumentation, and fusion.  The surgery went well (for full details of this operation, please refer to typed operative note).  POSTOPERATIVE COURSE:  The patient's postop course was as follows.  We had PT and OT see the patient.  The patient was progressively mobilized. PCA and Foley catheter were discontinued, and the patient was subsequently discharged to home on August 16, 2010.  DISCHARGE INSTRUCTIONS:  The patient was given written discharge instructions and instructed to follow up with me in 4 weeks.  FINAL DIAGNOSES:  L4-5 grade 1 acquired spondylolisthesis, spinal stenosis, facet arthropathy, disk degeneration, lumbago, lumbar radiculopathy.  PROCEDURE PERFORMED: 1. Bilateral L4 laminotomies and foraminotomies. 2. Decompressive bilateral L4 as well as L5 nerve  roots; L4-5     posterior lumbar interbody fusion with local morselized autograft     bone and Actifuse bone graft extender; insertion of L4-5 interbody     prosthesis (Globus     PEEK interbody prosthesis); L4-5 posterior nonsegmental     instrumentation with globus titanium pedicle screws and rods; L4-5     posterolateral arthrodesis with local morselized autograft bone and     Vitoss bone graft extender.     Cristi Loron, M.D.     JDJ/MEDQ  D:  09/01/2010  T:  09/02/2010  Job:  [id number]  Electronically Signed by Tressie Stalker M.D. on 09/14/2010 07:31:25 AM

## 2010-09-14 NOTE — Op Note (Signed)
  NAMESHY, GUALLPA NO.:  [id number]  MEDICAL RECORD NO.:  [id number]  LOCATION:  3038                         FACILITY:  [facility]  PHYSICIAN:  Cristi Loron, M.D.DATE OF BIRTH:  Sep 02, 1945  DATE OF PROCEDURE:  09/03/2010 DATE OF DISCHARGE:                              OPERATIVE REPORT   BRIEF HISTORY:  The patient is a black female who I performed a L4-5 decompression and fusion about 2 weeks ago.  She initially had done well but developed some drainage from her wound.  We started her on empiric antibiotics, but then the drainage persisted and I therefore recommended that we perform incision and drainage of her wound.  The patient has weighed the risks, benefits, and alternatives of surgery and desired to proceed with that operation.  PREOPERATIVE DIAGNOSIS:  Lumbar wound infection.  POSTOPERATIVE DIAGNOSIS:  Lumbar wound infection.  PROCEDURE:  Incision and drainage of wound.  SURGEON:  Cristi Loron, MD  ASSISTANT:  None.  ANESTHESIA:  General endotracheal.  ESTIMATED BLOOD LOSS:  Minimal.  SPECIMENS/CULTURES/DRAINS:  One subcutaneous Jackson-Pratt drain.  COMPLICATIONS:  None.  DESCRIPTION OF PROCEDURE:  The patient was brought to the operating room by the Anesthesia team.  General endotracheal anesthesia was induced. The patient was turned to the prone position on the Wilson frame.  The lumbosacral region was then prepared with Betadine scrub and Betadine solution.  Sterile drapes were applied.  I incised through the patient's fresh lumbar incision.  By this, we encountered purulent material in the subcutaneous space.  We obtained some cultures.  I then removed the purulent tissue with suction and irrigation.  I inspected the patient's lumbar fascia.  It appeared to be intact i.e. that the infection did not appear to track deeper to the subcutaneous tissues.  We then copiously irrigated the wound out with bacitracin solution.  We  obtained hemostasis using bipolar electrocautery.  I then removed the cerebellar retractor and then reapproximated the patient's subcutaneous tissue with interrupted 2-0 Vicryl suture and the skin with a running  DICTATION ENDED AT THIS POINT.     Cristi Loron, M.D.     JDJ/MEDQ  D:  09/03/2010  T:  09/04/2010  Job:  [id number]  Electronically Signed by Tressie Stalker M.D. on 09/14/2010 07:31:38 AM

## 2010-09-14 NOTE — Op Note (Signed)
  NAME:  AYAT, DRENNING NO.:  [id number]  MEDICAL RECORD NO.:  [id number]  LOCATION:                                 FACILITY:  PHYSICIAN:  Cristi Loron, M.D.    DATE OF BIRTH:  DATE OF PROCEDURE:  09/03/2010 DATE OF DISCHARGE:                              OPERATIVE REPORT   ADDENDUM:  We placed a 10-mm flat Jackson-Pratt drain in the subcutaneous space and tunneled out through separate stab wound and secured the drain at the exit site with 3-0 nylon suture.  We then closed this wound with 2-0 Vicryl sutures in subcutaneous tissue and a running 3-0 nylon suture and the skin.  We then coated with bacitracin ointment.  Sterile dressings applied.  The drapes were removed, and the patient was subsequently returned to supine position where she was extubated by the anesthesia team, and transported to post anesthesia care unit in stable condition.  All sponge, instrument, and needle counts were correct at the case.     Cristi Loron, M.D.     JDJ/MEDQ  D:  09/03/2010  T:  09/04/2010  Job:  [id number]  Electronically Signed by Tressie Stalker M.D. on 09/14/2010 07:31:45 AM

## 2010-09-14 NOTE — Discharge Summary (Signed)
  NAMESHERETA, CROTHERS NO.:  [id number]  MEDICAL RECORD NO.:  [id number]  LOCATION:  3038                         FACILITY:  [facility]  PHYSICIAN:  Cristi Loron, M.D.DATE OF BIRTH:  01-24-1945  DATE OF ADMISSION:  09/03/2010 DATE OF DISCHARGE:  09/08/2010                              DISCHARGE SUMMARY   BRIEF HISTORY:  The patient is a 65 year old black female on whom I performed an L4-5 decompression fusion about 3 which ago.  The patient used to do well, but developed some drainage from the wound.  The wound was cultured and she was started on empiric Keflex.  The wound culture grew Enterobacter and the patient was started on Cipro.  Her drainage persisted and I recommended surgery.  The patient aware of the risks, benefits, and alternatives of the surgery and decided to proceed with the incision and drainage of her wound.  For further details of this admission, please refer to the typed history and physical.  HOSPITAL COURSE:  I admitted the patient to [facility] on September 03, 2010.  On the day of admission, I performed an incision and drainage of the lumbar wound.  We obtained cultures which came back negative.  The patient was on antibiotics at that time.  The surgery went well (for full details of this operation please refer to the operative note).  POSTOPERATIVE COURSE:  The patient's postoperative course was unremarkable except we had the infectious disease doctor see the patient (Dr. Daiva Eves).  He recommended the patient be treated with about 4 weeks of intravenous vancomycin and Maxipime.  Subsequently the patient had a PICC line placed and arrangements were made for her to go to a skilled nursing facility, i.e., [facility].  A bed was available and she was transferred on September 08, 2010.  FINAL DIAGNOSIS:  Wound infections.  PROCEDURE PERFORMED:  Incision and drainage of wound and placement of a PICC line.  DISCHARGE  INSTRUCTIONS:  The patient is instructed to follow up in 2 weeks to have her sutures removed.     Cristi Loron, M.D.     JDJ/MEDQ  D:  09/08/2010  T:  09/08/2010  Job:  [id number]  cc:   Acey Lav, MD  Electronically Signed by Tressie Stalker M.D. on 09/14/2010 07:31:50 AM

## 2010-10-08 LAB — I-STAT 8, (EC8 V) (CONVERTED LAB)
Acid-Base Excess: 1
BUN: 6
Chloride: 103
HCT: 41
Hemoglobin: 13.9
Operator id: 151321
Sodium: 137
pCO2, Ven: 45.5

## 2010-10-08 LAB — CK TOTAL AND CKMB (NOT AT ARMC)
CK, MB: 1.2
Total CK: 70

## 2010-10-08 LAB — BASIC METABOLIC PANEL
CO2: 25
CO2: 28
Calcium: 8.6
Chloride: 101
Chloride: 101
GFR calc Af Amer: >60
GFR calc Af Amer: >60
Potassium: 3.9
Sodium: 136
Sodium: 138

## 2010-10-08 LAB — URINALYSIS, ROUTINE W REFLEX MICROSCOPIC
Hgb urine dipstick: NEGATIVE
Nitrite: NEGATIVE
Protein, ur: NEGATIVE
Specific Gravity, Urine: 1.021
Urobilinogen, UA: 0.2

## 2010-10-08 LAB — URINE CULTURE

## 2010-10-08 LAB — FUNGAL STAIN: Fungal Smear: NONE SEEN

## 2010-10-08 LAB — COMPREHENSIVE METABOLIC PANEL
BUN: 5 — ABNORMAL LOW
CO2: 28
Calcium: 8.7
Creatinine, Ser: 0.81
GFR calc Af Amer: >60
GFR calc non Af Amer: >60
Glucose, Bld: 225 — ABNORMAL HIGH

## 2010-10-08 LAB — CBC
Hemoglobin: 11.2 — ABNORMAL LOW
MCHC: 31.8
MCV: 84.6
RBC: 4.19
WBC: 11 — ABNORMAL HIGH

## 2010-10-08 LAB — CARDIAC PANEL(CRET KIN+CKTOT+MB+TROPI)
CK, MB: 1.8
Relative Index: INVALID
Total CK: 85

## 2010-10-08 LAB — TROPONIN I: Troponin I: 0.02

## 2010-10-08 LAB — LIPID PANEL
Cholesterol: 155
HDL: 36 — ABNORMAL LOW
Total CHOL/HDL Ratio: 4.3

## 2010-10-08 LAB — URINE MICROSCOPIC-ADD ON

## 2010-10-08 LAB — POCT I-STAT CREATININE: Creatinine, Ser: 0.8

## 2010-10-08 LAB — POCT CARDIAC MARKERS
Myoglobin, poc: 84
Operator id: 151321
Operator id: 151321

## 2010-10-08 LAB — AMYLASE: Amylase: 48

## 2010-10-08 LAB — PROTIME-INR: INR: 1

## 2010-10-11 NOTE — Consult Note (Signed)
[name], [name] NO.:  [id number]  MEDICAL RECORD NO.:  [id number]  LOCATION:  3038                         FACILITY:  [facility]  PHYSICIAN:  Acey Lav, MD  DATE OF BIRTH:  09-18-1945  DATE OF CONSULTATION:  09/04/2010 DATE OF DISCHARGE:                                CONSULTATION   REQUESTING PHYSICIAN:  Cristi Loron, MD with Neurosurgery.  REASON FOR CONSULTATION:  The patient with postoperative infection after L4-L5 decompression and fusion in late July.  DISCUSSION:  For details, please see the handwritten note and paper chart by my first year internal medicine resident.  I have reviewed the pertinent electronic paper medical records including history of present illness, past medical history, past surgical history, family history, social history, allergies, medications, and 12-point review of systems. I have examined the patient and agreed with the assessment and plan as outlined in his note. Briefly, this is a 65 year old African American female who underwent L4- L5 decompression and fusions on August 10, 2010, due to problems with neurogenic claudication.  She had developed drainage from her wound, which worsened in the last week.  She had been seen by her home health aide who had done a culture of the wound, which yielded an Enterobacter aerogenes species, which was sensitive to imipenem, ceftazidime, ceftriaxone, but resistant to cefazolin.  It was also sensitive to ciprofloxacin.  The patient had been placed on cephalexin and ciprofloxacin, but the drainage worsened.  She then was ultimately brought into the hospital on the 16th and underwent incision and debridement of her soft tissue infection by Dr. Lovell Sheehan.  Dr. Lovell Sheehan described that the area of purulence extended down to the subcutaneous space down into the deeper tissues, but did not appear to violate the fascia.  He debrided this area extensively and obtained cultures,  which have failed to yield any organisms.  The patient has improved symptomatically with therapy with vancomycin and Rocephin and we have been asked to assist with further workup and management.  While the fascia were not violated, there is concern on the part of the primary team that there might be a deeper infection.  MRI was not done due to confusion that might arise in the setting of the patient had recent surgery and might have changes that could be either considered as postoperative infection versus osteomyelitis.  Therefore, it was desired that the patient be treated for a longer course of therapy and I think this is reasonable.  Therefore, I will place the patient on vancomycin and change the ceftriaxone to cefepime.  This will provide coverage for MRSA coagulase-negative staph as well as methicillin-sensitive staph, streptococci, and Pseudomonas aeruginosa as well as the Enterobacter aerogenes that was isolated from the culture by the home nurse. We will continue this for total of 28 days and pull the PICC.  If the patient is to be followed [facility], please let us know, I will be happy to follow her up.  My partner, Dr. Maurice March will be available for questions over the weekend.     Acey Lav, MD     CV/MEDQ  D:  09/04/2010  T:  09/05/2010  Job:  [id number]  Electronically Signed by Paulette Blanch DAM MD on 10/11/2010 09:57:00 PM

## 2010-10-16 LAB — BUN: BUN: 5 — ABNORMAL LOW

## 2010-10-16 LAB — CREATININE, SERUM
Creatinine, Ser: 0.79
GFR calc Af Amer: >60
GFR calc non Af Amer: >60

## 2010-10-28 LAB — I-STAT 8, (EC8 V) (CONVERTED LAB)
BUN: 6
Bicarbonate: 24.4 — ABNORMAL HIGH
Chloride: 106
Operator id: 284141
pCO2, Ven: 30.3 — ABNORMAL LOW
pH, Ven: 7.515 — ABNORMAL HIGH

## 2010-10-28 LAB — D-DIMER, QUANTITATIVE: D-Dimer, Quant: 0.7 — ABNORMAL HIGH

## 2010-10-28 LAB — DIFFERENTIAL
Basophils Relative: 1
Eosinophils Absolute: 0
Lymphs Abs: 4.1 — ABNORMAL HIGH
Monocytes Relative: 6
Neutro Abs: 7.1
Neutrophils Relative %: 59

## 2010-10-28 LAB — CBC
Hemoglobin: 11.1 — ABNORMAL LOW
MCHC: 32.2
MCV: 83.8
Platelets: 354
RBC: 4.13
RBC: 4.56
WBC: 12 — ABNORMAL HIGH

## 2010-10-28 LAB — POCT CARDIAC MARKERS
Myoglobin, poc: 102
Operator id: 284141
Troponin i, poc: <0.05

## 2010-10-28 LAB — BASIC METABOLIC PANEL
CO2: 30
Calcium: 9.2
GFR calc Af Amer: >60
Potassium: 3.8
Sodium: 140

## 2010-11-02 LAB — I-STAT 8, (EC8 V) (CONVERTED LAB)
Acid-Base Excess: 1
BUN: 8
Chloride: 106
Glucose, Bld: 111 — ABNORMAL HIGH
Potassium: 4
TCO2: 25
pCO2, Ven: 34.6 — ABNORMAL LOW
pCO2, Ven: 47.2
pH, Ven: 7.379 — ABNORMAL HIGH
pH, Ven: 7.456 — ABNORMAL HIGH

## 2010-11-02 LAB — POCT CARDIAC MARKERS
CKMB, poc: <1 — ABNORMAL LOW
Myoglobin, poc: 55.2

## 2010-11-02 LAB — POCT I-STAT CREATININE
Creatinine, Ser: 0.7
Creatinine, Ser: 0.8
Operator id: 196461
Operator id: 196461

## 2010-11-05 LAB — LIPID PANEL
Cholesterol: 144
HDL: 42
LDL Cholesterol: 86
Total CHOL/HDL Ratio: 3.4
Triglycerides: 79
VLDL: 16

## 2010-11-05 LAB — CBC
HCT: 35.9 — ABNORMAL LOW
HCT: 36.1
Hemoglobin: 11.2 — ABNORMAL LOW
Hemoglobin: 11.6 — ABNORMAL LOW
Hemoglobin: 11.6 — ABNORMAL LOW
MCHC: 31.8
MCHC: 32
MCV: 84.6
Platelets: 342
RBC: 4.2
RBC: 4.28
RBC: 4.32
RDW: 15.4 — ABNORMAL HIGH
WBC: 11.7 — ABNORMAL HIGH
WBC: 12.6 — ABNORMAL HIGH

## 2010-11-05 LAB — CK TOTAL AND CKMB (NOT AT ARMC)
CK, MB: 1.7
Relative Index: INVALID
Total CK: 83

## 2010-11-05 LAB — MAGNESIUM: Magnesium: 1.9

## 2010-11-05 LAB — I-STAT 8, (EC8 V) (CONVERTED LAB)
BUN: 5 — ABNORMAL LOW
Glucose, Bld: 108 — ABNORMAL HIGH
Potassium: 3.9
TCO2: 25
pH, Ven: 7.541 — ABNORMAL HIGH

## 2010-11-05 LAB — POCT CARDIAC MARKERS
Myoglobin, poc: 93
Operator id: 196461
Troponin i, poc: <0.05

## 2010-11-05 LAB — DIFFERENTIAL
Basophils Absolute: 0.1
Lymphocytes Relative: 36
Monocytes Absolute: 0.9 — ABNORMAL HIGH
Monocytes Relative: 8
Neutro Abs: 6.4

## 2010-11-05 LAB — HEPARIN LEVEL (UNFRACTIONATED)
Heparin Unfractionated: 0.4
Heparin Unfractionated: 0.42

## 2010-11-05 LAB — CARDIAC PANEL(CRET KIN+CKTOT+MB+TROPI): Relative Index: 1.7

## 2010-11-05 LAB — POCT I-STAT CREATININE: Operator id: 196461

## 2010-11-22 IMAGING — CR DG CHEST 2V
2 series · 2 of 2 positions shown · non-contrast
Comparison: 12/07/2008

CLINICAL DATA: Hemorrhoids, preoperative assessment, history of
asthma, smoking, arrhythmia

CHEST - 2 VIEW

[w chest pa]
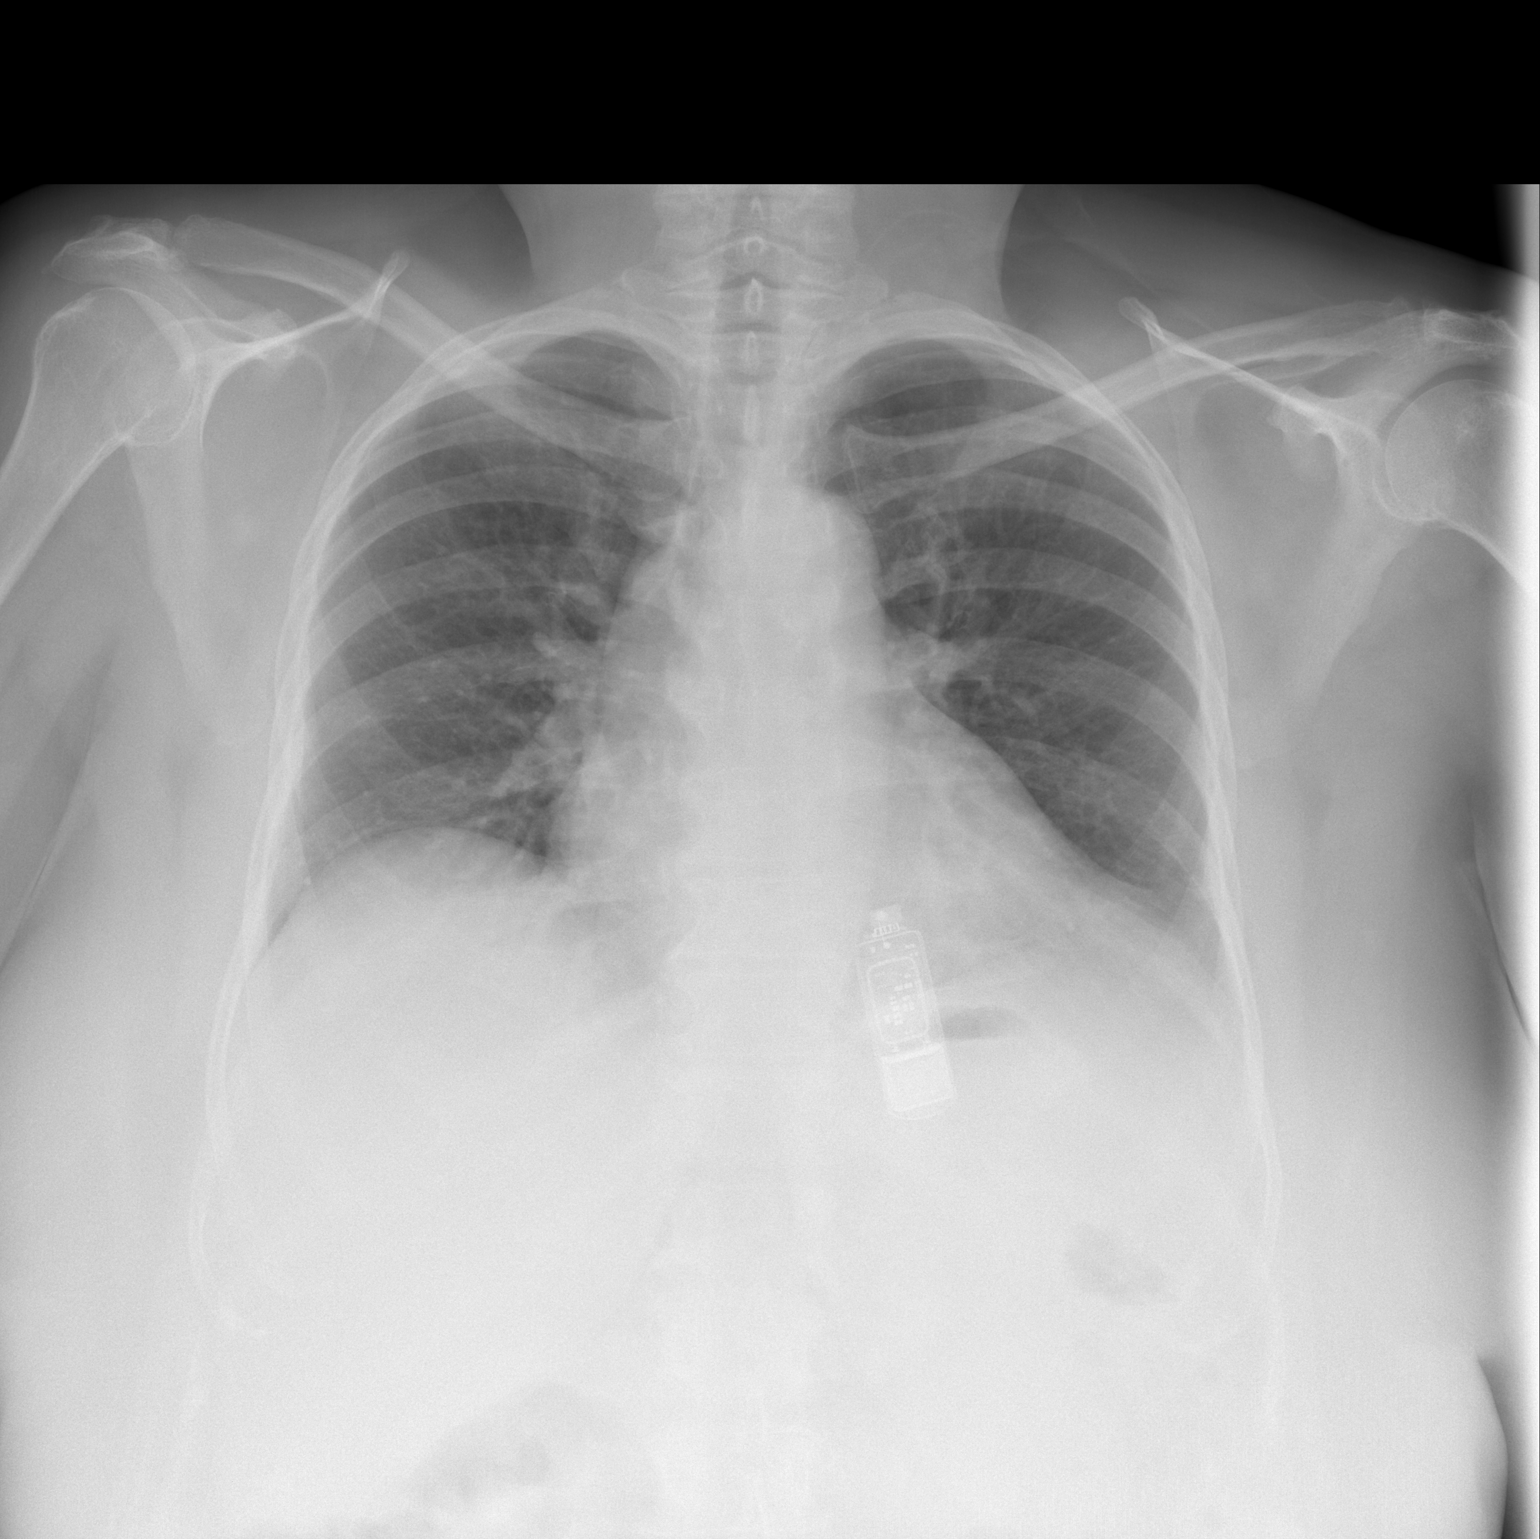

[w chest lat]
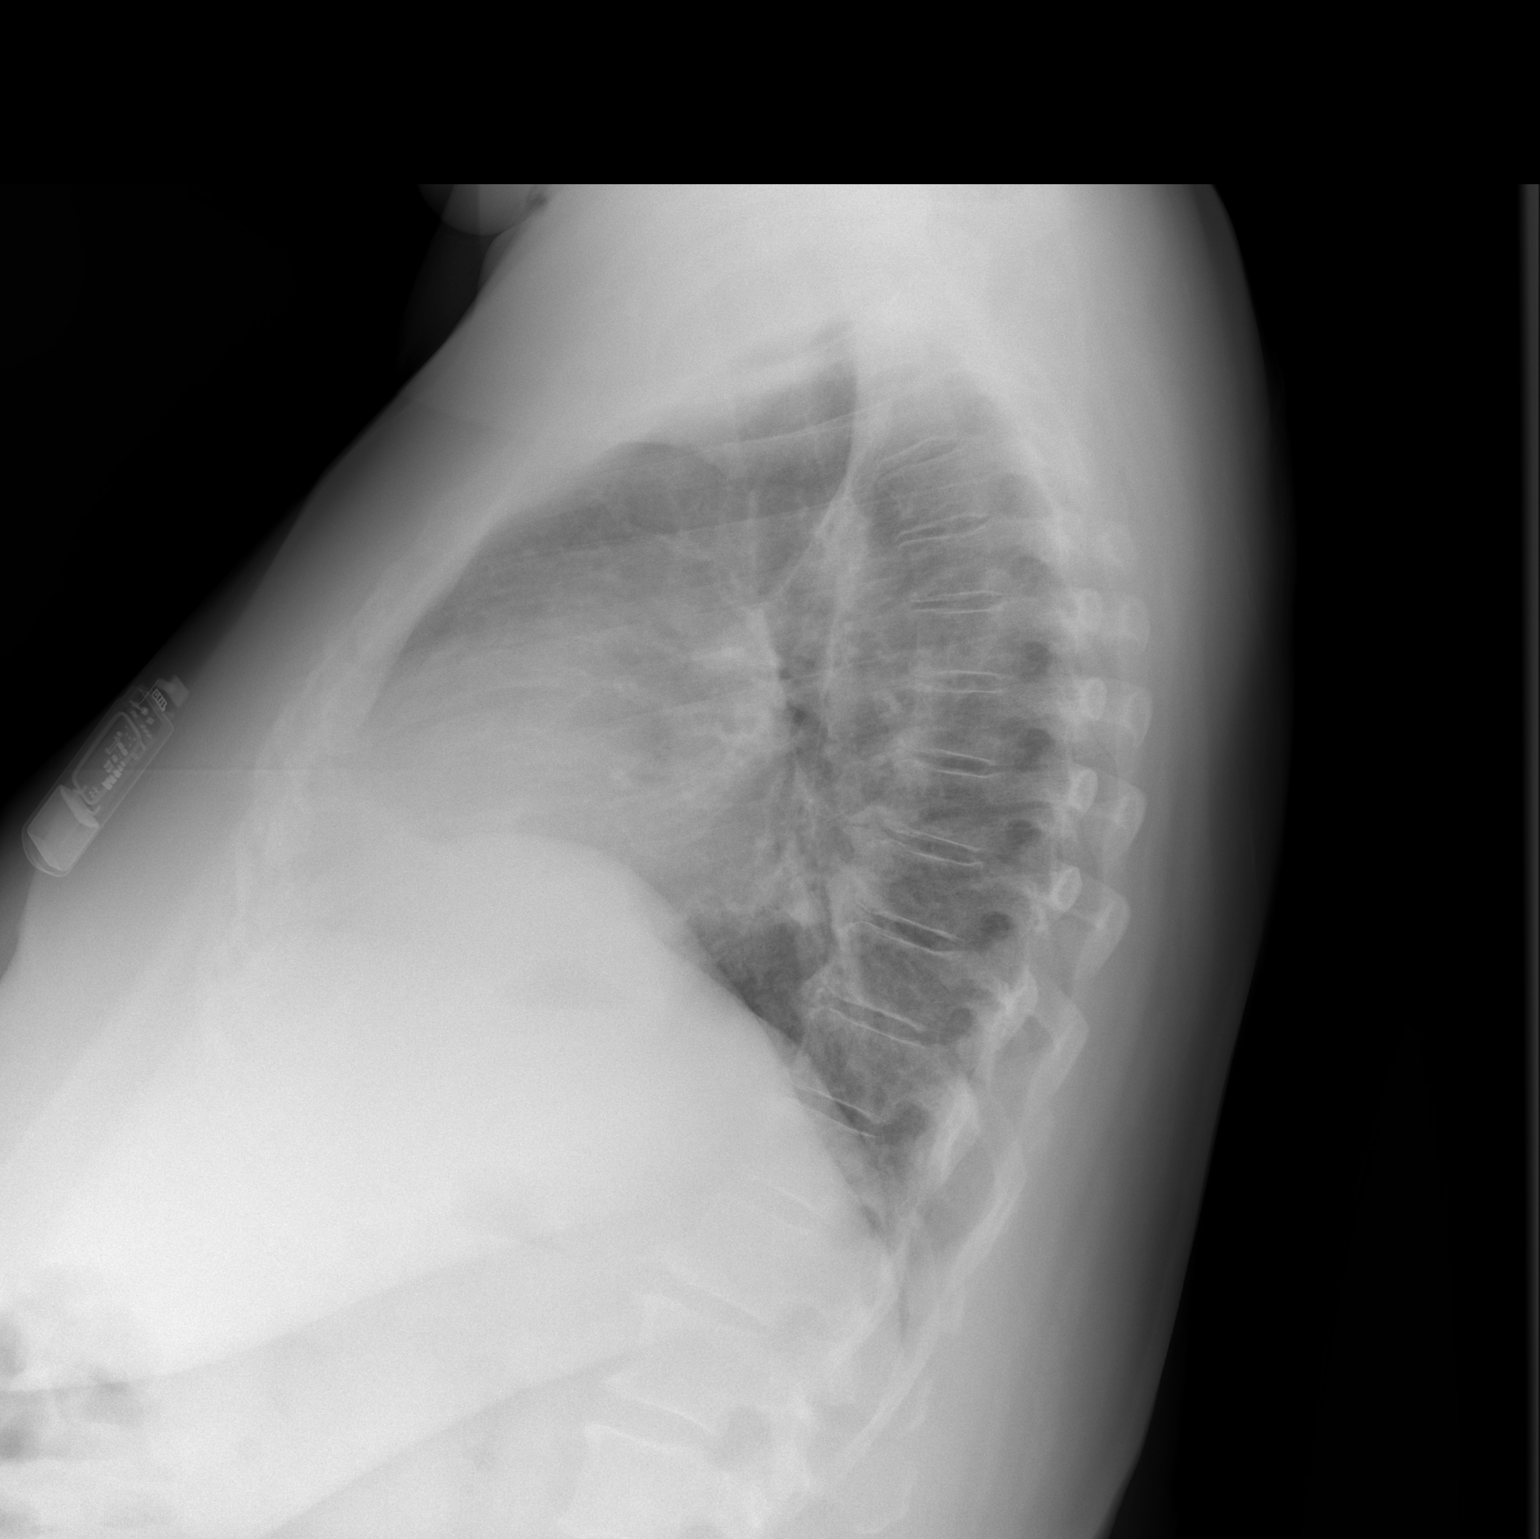

[2 of 2 positions shown; findings below may reference images not displayed]

FINDINGS: Cardiac enlargement.
Loop recorder projects over left lung base medially.
Mildly tortuous thoracic aorta.
Minimal pulmonary vascular congestion.
No acute infiltrate or effusion.
End plate spur formation thoracic spine.
IMPRESSION: No acute abnormalities.
Cardiomegaly with slight pulmonary vascular congestion.

## 2010-11-30 ENCOUNTER — Telehealth: Payer: Self-pay | Admitting: Internal Medicine

## 2010-11-30 NOTE — Telephone Encounter (Signed)
Returned call to patient ---She is having hip surgery on 12/14/10 with Dr Ophelia Charter at [facility] She will stay 2-3 days Patient wants to know what what you want to do as far as replacing her ILR She says if the plan is to replace it then she was thinking she could get it done while there for her hip  I told her I would ask Dr Ladona Ridgel  but did not know if this would be advisable to do both at the same time

## 2010-11-30 NOTE — Telephone Encounter (Signed)
Pt calling stating that battery on loop recorder is dead and pt wanted to know if MD wanted to pt to have loop recorded removed when pt has hip surgery or if Dr. Ladona Ridgel wanted the battery replaced. Please return pt call to discuss further.

## 2010-12-03 ENCOUNTER — Encounter (HOSPITAL_COMMUNITY): Payer: Self-pay | Admitting: Pharmacy Technician

## 2010-12-04 ENCOUNTER — Other Ambulatory Visit (HOSPITAL_COMMUNITY): Payer: Self-pay | Admitting: Orthopaedic Surgery

## 2010-12-04 ENCOUNTER — Encounter (HOSPITAL_COMMUNITY): Payer: Self-pay

## 2010-12-04 ENCOUNTER — Encounter (HOSPITAL_COMMUNITY)
Admission: RE | Admit: 2010-12-04 | Discharge: 2010-12-04 | Disposition: A | Discharge status: Home / Self Care | Payer: Medicare Other | Source: Ambulatory Visit | Attending: Orthopaedic Surgery | Admitting: Orthopaedic Surgery

## 2010-12-04 HISTORY — DX: Gastro-esophageal reflux disease without esophagitis: K21.9

## 2010-12-04 HISTORY — DX: Cardiac arrhythmia, unspecified: I49.9

## 2010-12-04 HISTORY — DX: Acute myocardial infarction, unspecified: I21.9

## 2010-12-04 HISTORY — DX: Diaphragmatic hernia without obstruction or gangrene: K44.9

## 2010-12-04 LAB — CBC
MCH: 26.3 pg (ref 26.0–34.0)
MCHC: 31.3 g/dL (ref 30.0–36.0)
Platelets: 306 10*3/uL (ref 150–400)
RBC: 4.11 MIL/uL (ref 3.87–5.11)

## 2010-12-04 LAB — COMPREHENSIVE METABOLIC PANEL
ALT: 21 U/L (ref 0–35)
AST: 17 U/L (ref 0–37)
Albumin: 3.6 g/dL (ref 3.5–5.2)
Calcium: 9.4 mg/dL (ref 8.4–10.5)
Sodium: 143 mEq/L (ref 135–145)
Total Protein: 7.1 g/dL (ref 6.0–8.3)

## 2010-12-04 LAB — URINALYSIS, ROUTINE W REFLEX MICROSCOPIC
Glucose, UA: NEGATIVE mg/dL
Nitrite: NEGATIVE
Urobilinogen, UA: 0.2 mg/dL (ref 0.0–1.0)
pH: 7.5 (ref 5.0–8.0)

## 2010-12-04 LAB — TYPE AND SCREEN

## 2010-12-04 LAB — SURGICAL PCR SCREEN: Staphylococcus aureus: NEGATIVE

## 2010-12-04 NOTE — Telephone Encounter (Signed)
Discussed with Dr Ladona Ridgel he does not think doing both at the dame time is a good idea  I have left patient a message that after she recovers from her hip surgery to call our office and schedule an appointment to come in and talk with Dr Ladona Ridgel about replacing or removing the loop recorder

## 2010-12-04 NOTE — Progress Notes (Addendum)
Stress test in epic from  7/01 ekg and cxr in epic from  5/12 Cath in epic from 7/12 Patient has loop recorder but battery is dead. See previous note from dr taylor

## 2010-12-04 NOTE — Pre-Procedure Instructions (Signed)
20 [name]  12/04/2010   Your procedure is scheduled on: 12/14/10  Report to [facility] at 1030 AM.  Call this number if you have problems the morning of surgery: [phone]   Remember:   Do not eat food:After Midnight.  May drink clear liquids: 4 Hours before arrival.  Take these medicines the morning of surgery with A SIP OF WATER: , coreg, restasis, imdur ,pataday, topamax if needed   Do not wear jewelry, make-up or nail polish.  Do not wear lotions, powders, or perfumes. You may wear deodorant.  Do not shave 48 hours prior to surgery.  Do not bring valuables to the hospital.  Contacts, dentures or bridgework may not be worn into surgery.  Leave suitcase in the car. After surgery it may be brought to your room.  For patients admitted to the hospital, checkout time is 11:00 AM the day of discharge.   Patients discharged the day of surgery will not be allowed to drive home.  Name and phone number of your driver:james Milligan  Son [phone]  Special Instructions: CHG Shower Use Special Wash: 1/2 bottle night before surgery and 1/2 bottle morning of surgery.   Please read over the following fact sheets that you were given: Pain Booklet, Coughing and Deep Breathing, MRSA Information and Surgical Site Infection Prevention

## 2010-12-07 ENCOUNTER — Encounter (HOSPITAL_COMMUNITY): Payer: Self-pay | Admitting: Vascular Surgery

## 2010-12-07 NOTE — Consult Note (Signed)
Anesthesia:  65 year old female for left total hip revision.  Hx + MI, asthma, DM2, GERD/hiatal hernia, and unexplained syncope (s/p loop recorder, but the battery is now dead).  She tolerated a lumbar lami and later an I&D of her surgical wound earlier this year (July and August) under [place].  I reviewed her EKG and CXR from 05/19/10.  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  Preoperative labs noted.  It appears that her primary Cardiologist is Dr. Shana Chute, but she is also followed by Dr. Ladona Ridgel d/t the implanted loop recorder.  The patient did contact Dr. Ladona Ridgel preoperatively b/c the loop recorder battery is dead.  He plans to follow up with this after she recovers from her his surgery.  Anticipate that she can proceed if no new syncope or CV symptoms.

## 2010-12-13 MED ORDER — CEFAZOLIN SODIUM 1-5 GM-% IV SOLN: 1.0000 g | INTRAVENOUS | Status: DC

## 2010-12-14 ENCOUNTER — Encounter (HOSPITAL_COMMUNITY): Payer: Self-pay | Admitting: Vascular Surgery

## 2010-12-14 ENCOUNTER — Encounter (HOSPITAL_COMMUNITY): Payer: Self-pay | Admitting: Orthopedic Surgery

## 2010-12-14 ENCOUNTER — Inpatient Hospital Stay (HOSPITAL_COMMUNITY): Payer: Medicare Other | Admitting: Vascular Surgery

## 2010-12-14 ENCOUNTER — Inpatient Hospital Stay (HOSPITAL_COMMUNITY): Payer: Medicare Other

## 2010-12-14 ENCOUNTER — Encounter (HOSPITAL_COMMUNITY): Payer: Self-pay

## 2010-12-14 ENCOUNTER — Encounter (HOSPITAL_COMMUNITY)
Admission: RE | Disposition: A | Discharge status: Skilled Nursing Facility | Payer: Self-pay | Source: Ambulatory Visit | Attending: Orthopaedic Surgery

## 2010-12-14 ENCOUNTER — Inpatient Hospital Stay (HOSPITAL_COMMUNITY)
Admission: RE | Admit: 2010-12-14 | Discharge: 2010-12-17 | DRG: 468 | Disposition: A | Discharge status: Skilled Nursing Facility | Payer: Medicare Other | Source: Ambulatory Visit | Attending: Orthopaedic Surgery | Admitting: Orthopaedic Surgery

## 2010-12-14 ENCOUNTER — Other Ambulatory Visit: Payer: Self-pay | Admitting: Orthopaedic Surgery

## 2010-12-14 DIAGNOSIS — Z7902 Long term (current) use of antithrombotics/antiplatelets: Secondary | ICD-10-CM

## 2010-12-14 DIAGNOSIS — Z96649 Presence of unspecified artificial hip joint: Secondary | ICD-10-CM

## 2010-12-14 DIAGNOSIS — K449 Diaphragmatic hernia without obstruction or gangrene: Secondary | ICD-10-CM | POA: Diagnosis present

## 2010-12-14 DIAGNOSIS — Z79899 Other long term (current) drug therapy: Secondary | ICD-10-CM

## 2010-12-14 DIAGNOSIS — T84069A Wear of articular bearing surface of unspecified internal prosthetic joint, initial encounter: Principal | ICD-10-CM | POA: Diagnosis present

## 2010-12-14 DIAGNOSIS — Z01812 Encounter for preprocedural laboratory examination: Secondary | ICD-10-CM

## 2010-12-14 DIAGNOSIS — I10 Essential (primary) hypertension: Secondary | ICD-10-CM | POA: Diagnosis present

## 2010-12-14 DIAGNOSIS — J45909 Unspecified asthma, uncomplicated: Secondary | ICD-10-CM | POA: Diagnosis present

## 2010-12-14 DIAGNOSIS — Y831 Surgical operation with implant of artificial internal device as the cause of abnormal reaction of the patient, or of later complication, without mention of misadventure at the time of the procedure: Secondary | ICD-10-CM | POA: Diagnosis present

## 2010-12-14 DIAGNOSIS — K219 Gastro-esophageal reflux disease without esophagitis: Secondary | ICD-10-CM | POA: Diagnosis present

## 2010-12-14 DIAGNOSIS — Z96659 Presence of unspecified artificial knee joint: Secondary | ICD-10-CM

## 2010-12-14 DIAGNOSIS — I252 Old myocardial infarction: Secondary | ICD-10-CM

## 2010-12-14 DIAGNOSIS — E119 Type 2 diabetes mellitus without complications: Secondary | ICD-10-CM | POA: Diagnosis present

## 2010-12-14 DIAGNOSIS — Z6836 Body mass index (BMI) 36.0-36.9, adult: Secondary | ICD-10-CM

## 2010-12-14 HISTORY — PX: TOTAL HIP REVISION: SHX763

## 2010-12-14 SURGERY — TOTAL HIP REVISION
Anesthesia: General | Site: Hip | Laterality: Left | Wound class: Clean

## 2010-12-14 MED ORDER — ONDANSETRON HCL 4 MG/2ML IJ SOLN: 4.0000 mg | Freq: Four times a day (QID) | INTRAMUSCULAR | Status: DC | PRN

## 2010-12-14 MED ORDER — LACTATED RINGERS IV SOLN
INTRAVENOUS | Status: DC | PRN
  Administered 2010-12-14 (×2): via INTRAVENOUS

## 2010-12-14 MED ORDER — WARFARIN SODIUM 7.5 MG PO TABS
7.5000 mg | ORAL_TABLET | ORAL | Status: AC
Start: 2010-12-14 — End: 2010-12-14
  Administered 2010-12-14: 7.5 mg via ORAL
  Filled 2010-12-14: qty 1

## 2010-12-14 MED ORDER — DOCUSATE SODIUM 100 MG PO CAPS
100.0000 mg | ORAL_CAPSULE | Freq: Two times a day (BID) | ORAL | Status: DC
  Administered 2010-12-14 – 2010-12-17 (×6): 100 mg via ORAL
  Filled 2010-12-14 (×7): qty 1

## 2010-12-14 MED ORDER — METHOCARBAMOL 500 MG PO TABS
500.0000 mg | ORAL_TABLET | Freq: Four times a day (QID) | ORAL | Status: DC | PRN
  Administered 2010-12-16 – 2010-12-17 (×2): 500 mg via ORAL
  Filled 2010-12-14 (×3): qty 1

## 2010-12-14 MED ORDER — GLIMEPIRIDE 2 MG PO TABS: 2.0000 mg | ORAL_TABLET | Freq: Two times a day (BID) | ORAL | Status: DC

## 2010-12-14 MED ORDER — SODIUM CHLORIDE 0.9 % IJ SOLN: 9.0000 mL | INTRAMUSCULAR | Status: DC | PRN

## 2010-12-14 MED ORDER — PROPOFOL 10 MG/ML IV EMUL
INTRAVENOUS | Status: DC | PRN
  Administered 2010-12-14: 160 mg via INTRAVENOUS

## 2010-12-14 MED ORDER — NALOXONE HCL 0.4 MG/ML IJ SOLN: 0.4000 mg | INTRAMUSCULAR | Status: DC | PRN

## 2010-12-14 MED ORDER — MORPHINE SULFATE (PF) 1 MG/ML IV SOLN
INTRAVENOUS | Status: DC
  Administered 2010-12-14: 18:00:00 via INTRAVENOUS
  Administered 2010-12-15: 12 mg via INTRAVENOUS
  Administered 2010-12-15: 17.68 mg via INTRAVENOUS
  Administered 2010-12-15: 3 mg via INTRAVENOUS
  Administered 2010-12-15: 11:00:00 via INTRAVENOUS
  Administered 2010-12-16: 1.5 mg via INTRAVENOUS
  Filled 2010-12-14: qty 25

## 2010-12-14 MED ORDER — ROCURONIUM BROMIDE 100 MG/10ML IV SOLN
INTRAVENOUS | Status: DC | PRN
  Administered 2010-12-14: 50 mg via INTRAVENOUS

## 2010-12-14 MED ORDER — METOCLOPRAMIDE HCL 10 MG PO TABS: 5.0000 mg | ORAL_TABLET | Freq: Three times a day (TID) | ORAL | Status: DC | PRN

## 2010-12-14 MED ORDER — PATIENT'S GUIDE TO USING COUMADIN BOOK
Freq: Once | Status: AC
Start: 2010-12-14 — End: 2010-12-14
  Administered 2010-12-14: 18:00:00
  Filled 2010-12-14: qty 1

## 2010-12-14 MED ORDER — METFORMIN HCL 1000 MG PO TABS: 1000.0000 mg | ORAL_TABLET | Freq: Two times a day (BID) | ORAL | Status: DC

## 2010-12-14 MED ORDER — MAGNESIUM HYDROXIDE 400 MG/5ML PO SUSP: 30.0000 mL | Freq: Two times a day (BID) | ORAL | Status: DC | PRN

## 2010-12-14 MED ORDER — CEFAZOLIN SODIUM-DEXTROSE 2-3 GM-% IV SOLR
INTRAVENOUS | Status: AC
  Filled 2010-12-14: qty 50

## 2010-12-14 MED ORDER — MORPHINE SULFATE (PF) 1 MG/ML IV SOLN
INTRAVENOUS | Status: AC
  Filled 2010-12-14: qty 25

## 2010-12-14 MED ORDER — BISACODYL 10 MG RE SUPP: 10.0000 mg | Freq: Every day | RECTAL | Status: DC | PRN

## 2010-12-14 MED ORDER — ONDANSETRON HCL 4 MG/2ML IJ SOLN: 4.0000 mg | Freq: Once | INTRAMUSCULAR | Status: DC | PRN

## 2010-12-14 MED ORDER — BISACODYL 5 MG PO TBEC: 10.0000 mg | DELAYED_RELEASE_TABLET | Freq: Every day | ORAL | Status: DC | PRN

## 2010-12-14 MED ORDER — DIPHENHYDRAMINE HCL 12.5 MG/5ML PO ELIX
12.5000 mg | ORAL_SOLUTION | Freq: Four times a day (QID) | ORAL | Status: DC | PRN
  Filled 2010-12-14: qty 5

## 2010-12-14 MED ORDER — EPHEDRINE SULFATE 50 MG/ML IJ SOLN
INTRAMUSCULAR | Status: DC | PRN
  Administered 2010-12-14: 10 mg via INTRAVENOUS

## 2010-12-14 MED ORDER — METOCLOPRAMIDE HCL 5 MG/ML IJ SOLN
5.0000 mg | Freq: Three times a day (TID) | INTRAMUSCULAR | Status: DC | PRN
  Filled 2010-12-14: qty 2

## 2010-12-14 MED ORDER — OXYCODONE-ACETAMINOPHEN 5-325 MG PO TABS
1.0000 | ORAL_TABLET | ORAL | Status: DC | PRN
  Administered 2010-12-15 – 2010-12-16 (×5): 2 via ORAL
  Filled 2010-12-14 (×5): qty 2

## 2010-12-14 MED ORDER — ACETAMINOPHEN 650 MG RE SUPP: 650.0000 mg | Freq: Four times a day (QID) | RECTAL | Status: DC | PRN

## 2010-12-14 MED ORDER — HYDROCODONE-ACETAMINOPHEN 10-325 MG PO TABS
1.0000 | ORAL_TABLET | ORAL | Status: DC | PRN
  Administered 2010-12-17 (×3): 2 via ORAL
  Filled 2010-12-14 (×3): qty 2

## 2010-12-14 MED ORDER — ONDANSETRON HCL 4 MG PO TABS: 4.0000 mg | ORAL_TABLET | Freq: Four times a day (QID) | ORAL | Status: DC | PRN

## 2010-12-14 MED ORDER — CEFAZOLIN SODIUM 1-5 GM-% IV SOLN
1.0000 g | Freq: Four times a day (QID) | INTRAVENOUS | Status: AC
  Administered 2010-12-14 – 2010-12-15 (×3): 1 g via INTRAVENOUS
  Filled 2010-12-14 (×3): qty 50

## 2010-12-14 MED ORDER — HYDROMORPHONE HCL PF 1 MG/ML IJ SOLN: 0.2500 mg | INTRAMUSCULAR | Status: DC | PRN

## 2010-12-14 MED ORDER — ONDANSETRON HCL 4 MG/2ML IJ SOLN
INTRAMUSCULAR | Status: DC | PRN
  Administered 2010-12-14: 4 mg via INTRAVENOUS

## 2010-12-14 MED ORDER — METHOCARBAMOL 100 MG/ML IJ SOLN
500.0000 mg | Freq: Four times a day (QID) | INTRAVENOUS | Status: DC | PRN
  Administered 2010-12-14: 500 mg via INTRAVENOUS
  Filled 2010-12-14: qty 5

## 2010-12-14 MED ORDER — FLEET ENEMA 7-19 GM/118ML RE ENEM: 1.0000 | ENEMA | Freq: Every day | RECTAL | Status: DC | PRN

## 2010-12-14 MED ORDER — DIPHENHYDRAMINE HCL 50 MG/ML IJ SOLN
12.5000 mg | Freq: Four times a day (QID) | INTRAMUSCULAR | Status: DC | PRN
  Administered 2010-12-15: 12.5 mg via INTRAVENOUS
  Filled 2010-12-14: qty 1

## 2010-12-14 MED ORDER — ONDANSETRON HCL 4 MG PO TABS
4.0000 mg | ORAL_TABLET | Freq: Four times a day (QID) | ORAL | Status: DC | PRN
Start: 2010-12-14 — End: 2010-12-17

## 2010-12-14 MED ORDER — POTASSIUM CHLORIDE IN NACL 20-0.45 MEQ/L-% IV SOLN
INTRAVENOUS | Status: DC
  Administered 2010-12-15 (×2): via INTRAVENOUS
  Filled 2010-12-14 (×6): qty 1000

## 2010-12-14 MED ORDER — FENTANYL CITRATE 0.05 MG/ML IJ SOLN
INTRAMUSCULAR | Status: DC | PRN
  Administered 2010-12-14 (×3): 50 ug via INTRAVENOUS
  Administered 2010-12-14: 100 ug via INTRAVENOUS

## 2010-12-14 MED ORDER — ACETAMINOPHEN 325 MG PO TABS: 650.0000 mg | ORAL_TABLET | Freq: Four times a day (QID) | ORAL | Status: DC | PRN

## 2010-12-14 MED ORDER — INSULIN ASPART 100 UNIT/ML ~~LOC~~ SOLN
0.0000 [IU] | Freq: Three times a day (TID) | SUBCUTANEOUS | Status: DC
  Administered 2010-12-15 – 2010-12-17 (×3): 2 [IU] via SUBCUTANEOUS
  Filled 2010-12-14: qty 3

## 2010-12-14 MED ORDER — SODIUM CHLORIDE 0.9 % IR SOLN
Status: DC | PRN
  Administered 2010-12-14: 1000 mL

## 2010-12-14 MED ORDER — WARFARIN VIDEO
Freq: Once | Status: DC
  Filled 2010-12-14: qty 1

## 2010-12-14 MED ORDER — GLYCOPYRROLATE 0.2 MG/ML IJ SOLN
INTRAMUSCULAR | Status: DC | PRN
  Administered 2010-12-14: .5 mg via INTRAVENOUS

## 2010-12-14 MED ORDER — NEOSTIGMINE METHYLSULFATE 1 MG/ML IJ SOLN
INTRAMUSCULAR | Status: DC | PRN
  Administered 2010-12-14: 4 mg via INTRAVENOUS

## 2010-12-14 MED ORDER — DIPHENHYDRAMINE HCL 12.5 MG/5ML PO ELIX
12.5000 mg | ORAL_SOLUTION | ORAL | Status: DC | PRN
  Administered 2010-12-15 – 2010-12-17 (×2): 12.5 mg via ORAL
  Filled 2010-12-14: qty 10

## 2010-12-14 MED ORDER — POLYETHYLENE GLYCOL 3350 17 G PO PACK
17.0000 g | PACK | Freq: Every day | ORAL | Status: DC | PRN
  Filled 2010-12-14: qty 1

## 2010-12-14 MED ORDER — LACTATED RINGERS IV SOLN
INTRAVENOUS | Status: DC
  Administered 2010-12-14: 13:00:00 via INTRAVENOUS

## 2010-12-14 SURGICAL SUPPLY — 69 items
BENZOIN TINCTURE PRP APPL 2/3 (GAUZE/BANDAGES/DRESSINGS) IMPLANT
BLADE OSC STABLECUT 1.27X90X25 (BLADE) IMPLANT
BLADE SURG ROTATE 9660 (MISCELLANEOUS) IMPLANT
BRUSH FEMORAL CANAL (MISCELLANEOUS) IMPLANT
CLOTH BEACON ORANGE TIMEOUT ST (SAFETY) ×2 IMPLANT
COVER BACK TABLE 24X17X13 BIG (DRAPES) ×2 IMPLANT
COVER SURGICAL LIGHT HANDLE (MISCELLANEOUS) ×2 IMPLANT
DRAPE C-ARM 42X72 X-RAY (DRAPES) IMPLANT
DRAPE INCISE IOBAN 66X45 STRL (DRAPES) IMPLANT
DRAPE ORTHO SPLIT 77X108 STRL (DRAPES) ×2
DRAPE SURG ORHT 6 SPLT 77X108 (DRAPES) ×2 IMPLANT
DRAPE U-SHAPE 47X51 STRL (DRAPES) ×2 IMPLANT
DRSG ADAPTIC 3X8 NADH LF (GAUZE/BANDAGES/DRESSINGS) ×2 IMPLANT
DRSG MEPILEX BORDER 4X12 (GAUZE/BANDAGES/DRESSINGS) ×2 IMPLANT
DRSG PAD ABDOMINAL 8X10 ST (GAUZE/BANDAGES/DRESSINGS) ×4 IMPLANT
DURAPREP 26ML APPLICATOR (WOUND CARE) ×2 IMPLANT
ELECT CAUTERY BLADE 6.4 (BLADE) ×2 IMPLANT
ELECT REM PT RETURN 9FT ADLT (ELECTROSURGICAL) ×2
ELECTRODE REM PT RTRN 9FT ADLT (ELECTROSURGICAL) ×1 IMPLANT
EVACUATOR 1/8 PVC DRAIN (DRAIN) IMPLANT
FACESHIELD LNG OPTICON STERILE (SAFETY) ×4 IMPLANT
GAUZE SPONGE 4X4 12PLY STRL LF (GAUZE/BANDAGES/DRESSINGS) ×2 IMPLANT
GAUZE XEROFORM 5X9 LF (GAUZE/BANDAGES/DRESSINGS) ×2 IMPLANT
GLOVE BIOGEL PI IND STRL 7.5 (GLOVE) ×1 IMPLANT
GLOVE BIOGEL PI IND STRL 8 (GLOVE) ×1 IMPLANT
GLOVE BIOGEL PI INDICATOR 7.5 (GLOVE) ×1
GLOVE BIOGEL PI INDICATOR 8 (GLOVE) ×1
GLOVE ECLIPSE 7.0 STRL STRAW (GLOVE) ×2 IMPLANT
GLOVE ORTHO TXT STRL SZ7.5 (GLOVE) ×2 IMPLANT
GOWN PREVENTION PLUS LG XLONG (DISPOSABLE) ×2 IMPLANT
GOWN PREVENTION PLUS XLARGE (GOWN DISPOSABLE) ×4 IMPLANT
GOWN SPEC PROT W/COTTON TOWEL (GOWNS) IMPLANT
GOWN STRL NON-REIN LRG LVL3 (GOWN DISPOSABLE) IMPLANT
HANDPIECE INTERPULSE COAX TIP (DISPOSABLE)
IMMOBILIZER KNEE 20 (SOFTGOODS)
IMMOBILIZER KNEE 20 THIGH 36 (SOFTGOODS) IMPLANT
IMMOBILIZER KNEE 22 UNIV (SOFTGOODS) ×2 IMPLANT
IMMOBILIZER KNEE 24 THIGH 36 (MISCELLANEOUS) IMPLANT
IMMOBILIZER KNEE 24 UNIV (MISCELLANEOUS)
KIT BASIN OR (CUSTOM PROCEDURE TRAY) ×2 IMPLANT
KIT ROOM TURNOVER OR (KITS) ×2 IMPLANT
LINER MARATHON NEUTRAL 48M 10D (Hips) ×2 IMPLANT
MANIFOLD NEPTUNE II (INSTRUMENTS) ×2 IMPLANT
NEEDLE 1/2 CIR MAYO (NEEDLE) ×2 IMPLANT
NEEDLE HYPO 25GX1X1/2 BEV (NEEDLE) ×2 IMPLANT
NS IRRIG 1000ML POUR BTL (IV SOLUTION) ×2 IMPLANT
PACK TOTAL JOINT (CUSTOM PROCEDURE TRAY) ×2 IMPLANT
PAD ABD 5X9 TENDERSORB (GAUZE/BANDAGES/DRESSINGS) ×2 IMPLANT
PAD ARMBOARD 7.5X6 YLW CONV (MISCELLANEOUS) ×2 IMPLANT
PILLOW ABDUCTION HIP (SOFTGOODS) IMPLANT
REAMER ROD DEEP FLUTE 2.5X950 (INSTRUMENTS) IMPLANT
RING LOCK ACET OD 48 (Hips) ×2 IMPLANT
SET HNDPC FAN SPRY TIP SCT (DISPOSABLE) IMPLANT
SPONGE GAUZE 4X4 12PLY (GAUZE/BANDAGES/DRESSINGS) ×2 IMPLANT
SPONGE LAP 4X18 X RAY DECT (DISPOSABLE) ×2 IMPLANT
STAPLER VISISTAT 35W (STAPLE) ×2 IMPLANT
SUCTION FRAZIER TIP 10 FR DISP (SUCTIONS) ×2 IMPLANT
SUT ETHIBOND NAB CT1 #1 30IN (SUTURE) ×4 IMPLANT
SUT TICRON (SUTURE) ×4 IMPLANT
SUT VIC AB 2-0 CT1 27 (SUTURE) ×4
SUT VIC AB 2-0 CT1 TAPERPNT 27 (SUTURE) ×4 IMPLANT
SUT VICRYL 0 TIES 12 18 (SUTURE) ×2 IMPLANT
SYR CONTROL 10ML LL (SYRINGE) ×2 IMPLANT
TAPE CLOTH SURG 6X10 WHT LF (GAUZE/BANDAGES/DRESSINGS) ×2 IMPLANT
TOWEL OR 17X24 6PK STRL BLUE (TOWEL DISPOSABLE) ×2 IMPLANT
TOWEL OR 17X26 10 PK STRL BLUE (TOWEL DISPOSABLE) ×2 IMPLANT
TOWER CARTRIDGE SMART MIX (DISPOSABLE) IMPLANT
TRAY FOLEY CATH 14FR (SET/KITS/TRAYS/PACK) ×2 IMPLANT
WATER STERILE IRR 1000ML POUR (IV SOLUTION) ×4 IMPLANT

## 2010-12-14 NOTE — Anesthesia Preprocedure Evaluation (Addendum)
Anesthesia Evaluation  Patient identified by MRN, date of birth, ID band Patient awake    Reviewed: H&P , NPO status , Patient's Chart, lab work & pertinent test results  Airway Mallampati: I TM Distance: >3 FB Neck ROM: full    Dental  (+) Upper Dentures and Lower Dentures   Pulmonary asthma , former smoker (\[id number]\)         Cardiovascular hypertension, + angina + Past MI + dysrhythmias regular Normal    Neuro/Psych  Neuromuscular disease Negative Psych ROS   GI/Hepatic hiatal hernia, GERD-  Medicated and Controlled,  Endo/Other  Diabetes mellitus-, Type 2, Oral Hypoglycemic AgentsMorbid obesity  Renal/GU Renal disease (\[id number]\GEXB on kidney)     Musculoskeletal   Abdominal   Peds  Hematology negative hematology ROS (+)   Anesthesia Other Findings   Reproductive/Obstetrics                          Anesthesia Physical Anesthesia Plan  ASA: III  Anesthesia Plan: General   Post-op Pain Management:    Induction: Intravenous  Airway Management Planned: Oral ETT  Additional Equipment:   Intra-op Plan:   Post-operative Plan: Extubation in OR  Informed Consent: I have reviewed the patients History and Physical, chart, labs and discussed the procedure including the risks, benefits and alternatives for the proposed anesthesia with the patient or authorized representative who has indicated his/her understanding and acceptance.     Plan Discussed with: Anesthesiologist, CRNA and Surgeon  Anesthesia Plan Comments:        Anesthesia Quick Evaluation

## 2010-12-14 NOTE — H&P (Addendum)
[name] is an 65 y.o. female. With painful left THA  Chief Complaint: painful left THA original 1992 possible hyler liner then revision in 1998 with progressive eccentric poly wear.  Aspirate neg for infection , CBC, sed rate and CRP pre-op normal  . For poly exchange and hip ball exchange vs acet revision.  HPI: as above  Past Medical History  Diagnosis Date  . Myocardial infarction 98  . Dysrhythmia     arrythmia and spasms occ none for 8-9 months drf frasier  seeing me while dr sprull out with back  . Angina     none recently  . Asthma   . Blood transfusion   . GERD (gastroesophageal reflux disease)   . Hiatal hernia   . Diverticulitis   . Diabetes mellitus     type 2    Past Surgical History  Procedure Date  . Abdominal hysterectomy   . Joint replacement     lsft hip x2, rt knee  . Knee arthroscopy     x2  . Foot surgery     bil feet great toes and second toes spurs  . Breast surgery     left bx  . Tonsillectomy   . Back surgery     x2 one for infection  . Cholecystectomy     History reviewed. No pertinent family history. Social History:  reports that she has quit smoking. She has never used smokeless tobacco. She reports that she does not drink alcohol or use illicit drugs.  Allergies:  Allergies  Allergen Reactions  . Contrast Media (Iodinated Diagnostic Agents) Anaphylaxis  . Aspirin Nausea And Vomiting  . Gadolinium Other (See Comments)     Desc: throat swells   . Iohexol Other (See Comments)    unknown    Medications Prior to Admission  Medication Dose Route Frequency Provider Last Rate Last Dose  . ceFAZolin (ANCEF) 2-3 GM-% IVPB SOLR           . ceFAZolin (ANCEF) IVPB 1 g/50 mL premix  1 g Intravenous 60 min Pre-Op Eldred Manges       No current outpatient prescriptions on file as of 12/14/2010.  Prior to Admission medications  Medication Sig Start Date End Date Taking? Authorizing Provider amitriptyline (ELAVIL) 50 MG tablet Take 50 mg by  mouth at bedtime.     Yes Historical Provider, MD atorvastatin (LIPITOR) 10 MG tablet Take 10 mg by mouth daily.     Yes Historical Provider, MD carvedilol (COREG) 12.5 MG tablet Take 12.5 mg by mouth 2 (two) times daily with a meal.     Yes Historical Provider, MD clopidogrel (PLAVIX) 75 MG tablet Take 75 mg by mouth daily.     Yes Historical Provider, MD cycloSPORINE (RESTASIS) 0.05 % ophthalmic emulsion Place 1 drop into both eyes 2 (two) times daily.     Yes Historical Provider, MD dexlansoprazole (DEXILANT) 60 MG capsule Take 60 mg by mouth daily.    Yes Historical Provider, MD diazepam (VALIUM) 5 MG tablet Take 5 mg by mouth every 6 (six) hours as needed. For muscle spasms     Yes Historical Provider, MD glimepiride (AMARYL) 2 MG tablet Take 2 mg by mouth 2 (two) times daily.     Yes Historical Provider, MD HYDROcodone-acetaminophen (NORCO) 10-325 MG per tablet Take 0.5-1 tablets by mouth every 6 (six) hours as needed. For pain    Yes Historical Provider, MD isosorbide mononitrate (IMDUR) 60 MG 24 hr tablet Take 60  mg by mouth 2 (two) times daily.     Yes Historical Provider, MD losartan (COZAAR) 100 MG tablet Take 100 mg by mouth daily.     Yes Historical Provider, MD metFORMIN (GLUCOPHAGE) 1000 MG tablet Take 1,000 mg by mouth 2 (two) times daily with a meal.     Yes Historical Provider, MD Olopatadine HCl (PATADAY) 0.2 % SOLN Place 1 drop into both eyes daily.     Yes Historical Provider, MD saxagliptin HCl (ONGLYZA) 5 MG TABS tablet Take 5 mg by mouth daily.     Yes Historical Provider, MD topiramate (TOPAMAX) 100 MG tablet Take 100 mg by mouth daily. Takes 100mg  at night. & 25mg  in the morning    Yes Historical Provider, MD topiramate (TOPAMAX) 25 MG capsule Take 25 mg by mouth daily. Takes 25mg  in the morning & 100mg  in the evening    Yes Historical Provider, MD zolpidem (AMBIEN) 10 MG tablet Take 10 mg by mouth at bedtime.    Yes Historical Provider, MD nitroGLYCERIN (NITROLINGUAL) 0.4  MG/SPRAY spray Place 1 spray under the tongue every 5 (five) minutes as needed. Chest pains      Historical Provider, MD   Results for orders placed during the hospital encounter of 12/14/10 (from the past 48 hour(s))  GLUCOSE, CAPILLARY     Status: Abnormal   Collection Time   12/14/10 10:26 AM      Component Value Range Comment   Glucose-Capillary 113 (*) 70 - 99 (mg/dL)    No results found.  Review of Systems  Constitutional: Negative.   HENT: Negative.   Eyes: Negative.   Respiratory: Negative.   Cardiovascular: Positive for chest pain and palpitations.       Loop recorder  Gastrointestinal: Negative.   Skin: Negative.   Endo/Heme/Allergies:       Positive for diabetes  Psychiatric/Behavioral: Negative.   TKA no complications.   Blood pressure 133/84, pulse 98, temperature 98.8 F (37.1 C), temperature source Oral, resp. rate 20, SpO2 96.00%. Physical Exam  Constitutional: She appears well-developed.  HENT:  Head: Normocephalic.  Eyes: Pupils are equal, round, and reactive to light.  Neck: Normal range of motion.  Cardiovascular: Normal rate and regular rhythm.   Respiratory: Breath sounds normal.       Palpable loop recorder left chest wall  parasternal  Neurological: She is alert.  Skin: Skin is warm.  Psychiatric: She has a normal mood and affect.   previous lumbar instrumented fusion.   Assessment/Plan xrays of hip pre-op show ecentric poly wear with 2 mm or less poly left .   Plan as above , poly exchange and ball exchange vs acetabular revision. Risks of bleeding , injection, disclocation, DVT  MI and death discussed, op vs non-op Tx discussed. She understands and agrees to proceed.   Teleah Villamar C 12/14/2010, 12:30 PM

## 2010-12-14 NOTE — Progress Notes (Signed)
ADDENDUM: Pt. Is seen by Dr. Shana Chute but recently was covered by Dr. Sharyn Lull, due to Dr. Magda Kiel unavailability.  Pt. also seen by Dr. Bascom Levels, call to their office & they are out of the office today.

## 2010-12-14 NOTE — Consults (Signed)
ANTICOAGULATION CONSULT NOTE - Initial Consult  Pharmacy Consult for Coumadin Indication: VTE prophylaxis following Total Hip Revision  Allergies  Allergen Reactions  . Contrast Media (Iodinated Diagnostic Agents) Anaphylaxis  . Aspirin Nausea And Vomiting  . Gadolinium Other (See Comments)     Desc: throat swells   . Iohexol Other (See Comments)    unknown    Patient Measurements: Height: 5\' 5"  (165.1 cm) Weight: 219 lb (99.338 kg) IBW/kg (Calculated) : 57    Vital Signs: Temp: 98.3 F (36.8 C) (11/26 1721) Temp src: Oral (11/26 1024) BP: 130/78 mmHg (11/26 1721) Pulse Rate: 90  (11/26 1721)  Labs: Estimated Creatinine Clearance: 84 ml/min (by C-G formula based on Cr of 0.71).  Medical History: Past Medical History  Diagnosis Date  . Myocardial infarction 98  . Dysrhythmia     arrythmia and spasms occ none for 8-9 months drf frasier  seeing me while dr sprull out with back  . Angina     none recently  . Asthma   . Blood transfusion   . GERD (gastroesophageal reflux disease)   . Hiatal hernia   . Diverticulitis   . Diabetes mellitus     type 2    Medications:  Prescriptions prior to admission  Medication Sig Dispense Refill  . amitriptyline (ELAVIL) 50 MG tablet Take 50 mg by mouth at bedtime.        Marland Kitchen atorvastatin (LIPITOR) 10 MG tablet Take 10 mg by mouth daily.        . carvedilol (COREG) 12.5 MG tablet Take 12.5 mg by mouth 2 (two) times daily with a meal.        . clopidogrel (PLAVIX) 75 MG tablet Take 75 mg by mouth daily.        . cycloSPORINE (RESTASIS) 0.05 % ophthalmic emulsion Place 1 drop into both eyes 2 (two) times daily.        Marland Kitchen dexlansoprazole (DEXILANT) 60 MG capsule Take 60 mg by mouth daily.       . diazepam (VALIUM) 5 MG tablet Take 5 mg by mouth every 6 (six) hours as needed. For muscle spasms        . isosorbide mononitrate (IMDUR) 60 MG 24 hr tablet Take 60 mg by mouth 2 (two) times daily.        Marland Kitchen losartan (COZAAR) 100 MG tablet  Take 100 mg by mouth daily.        . Olopatadine HCl (PATADAY) 0.2 % SOLN Place 1 drop into both eyes daily.        . saxagliptin HCl (ONGLYZA) 5 MG TABS tablet Take 5 mg by mouth daily.        Marland Kitchen topiramate (TOPAMAX) 100 MG tablet Take 100 mg by mouth daily. Takes 100mg  at night. & 25mg  in the morning       . topiramate (TOPAMAX) 25 MG capsule Take 25 mg by mouth daily. Takes 25mg  in the morning & 100mg  in the evening       . zolpidem (AMBIEN) 10 MG tablet Take 10 mg by mouth at bedtime.       Marland Kitchen DISCONTD: glimepiride (AMARYL) 2 MG tablet Take 2 mg by mouth 2 (two) times daily.        Marland Kitchen DISCONTD: HYDROcodone-acetaminophen (NORCO) 10-325 MG per tablet Take 0.5-1 tablets by mouth every 6 (six) hours as needed. For pain       . DISCONTD: metFORMIN (GLUCOPHAGE) 1000 MG tablet Take 1,000 mg by mouth 2 (two) times  daily with a meal.        . nitroGLYCERIN (NITROLINGUAL) 0.4 MG/SPRAY spray Place 1 spray under the tongue every 5 (five) minutes as needed. Chest pains          Assessment: S/P Total Hip Revision  Goal of Therapy:  INR 2-3   Plan:  Coumadin 7.5mg  x 1 . Daily PT/INR, CBC.  Jontae Sonier, Elisha Headland, Pharm.D. 12/14/2010 6:10 PM

## 2010-12-14 NOTE — Transfer of Care (Signed)
Immediate Anesthesia Transfer of Care Note  Patient: [name]  Procedure(s) Performed:  TOTAL HIP REVISION - Left Total Hip Arthroplasty Revision, Poly and Ball Exchange, Possible Acetabular Revision vs. Poly Exchange  Patient Location: PACU  Anesthesia Type: General  Level of Consciousness: awake, alert  and oriented  Airway & Oxygen Therapy: Patient Spontanous Breathing and Patient connected to face mask oxygen  Post-op Assessment: Report given to PACU RN and Post -op Vital signs reviewed and stable  Post vital signs: Reviewed and stable  Complications: No apparent anesthesia complications

## 2010-12-14 NOTE — Anesthesia Procedure Notes (Signed)
Performed by: Konstantine Gervasi Z       

## 2010-12-14 NOTE — Preoperative (Signed)
Beta Blockers   Reason not to administer Beta Blockers:Not Applicable 

## 2010-12-14 NOTE — Brief Op Note (Signed)
12/14/2010  3:15 PM  PATIENT:  [name]  65 y.o. female  PRE-OPERATIVE DIAGNOSIS:  Painful Left Total Hip Arthroplasty  POST-OPERATIVE DIAGNOSIS:  Painful Left Total Hip Arthroplasty  PROCEDURE:  Procedure(s) TOTAL HIP REVISION,  Poly liner exchange  SURGEON:  Surgeon(s): Eldred Manges  PHYSICIAN ASSISTANT: Maud Deed PA-C ASSISTANTS: none   ANESTHESIA:   local and general  EBL:  Total I/O In: 1200 [I.V.:1200] Out: 450 [Urine:250; Blood:200]  BLOOD ADMINISTERED:none  DRAINS: none   LOCAL MEDICATIONS USED:  MARCAINE 10CC  SPECIMEN:  Hip capsule synovium  DISPOSITION OF SPECIMEN:  To path dept.   COUNTS:  YES  TOURNIQUET:  * No tourniquets in log *  DICTATION: .Other Dictation: Dictation Number [id number]  PLAN OF CARE: Admit to inpatient   PATIENT DISPOSITION:  PACU - hemodynamically stable.   Delay start of Pharmacological VTE agent (>24hrs) due to surgical blood loss or risk of bleeding:  {YES/NO/NOT APPLICABLE:20182

## 2010-12-14 NOTE — Progress Notes (Signed)
12/14/2010, faxed request to Dr. Annitta Jersey office for last OV note at 1111, spoke /w Raynelle Fanning.  No fax rec'd by 1200noon, call again to their office, they are at lunch.

## 2010-12-14 NOTE — Anesthesia Postprocedure Evaluation (Signed)
  Anesthesia Post-op Note  Patient: [name]  Procedure(s) Performed:  TOTAL HIP REVISION - Left Total Hip Arthroplasty Revision, Poly and Ball Exchange, Possible Acetabular Revision vs. Poly Exchange  Patient Location: PACU  Anesthesia Type: General  Level of Consciousness: oriented, sedated and patient cooperative  Airway and Oxygen Therapy: Patient Spontanous Breathing and Patient connected to nasal cannula oxygen  Post-op Pain: mild  Post-op Assessment: Post-op Vital signs reviewed, Patient's Cardiovascular Status Stable, Respiratory Function Stable, Patent Airway, No signs of Nausea or vomiting and Pain level controlled  Post-op Vital Signs: stable  Complications: No apparent anesthesia complications

## 2010-12-15 ENCOUNTER — Encounter (HOSPITAL_COMMUNITY): Payer: Self-pay | Admitting: Orthopaedic Surgery

## 2010-12-15 LAB — PROTIME-INR: INR: 1.05 (ref 0.00–1.49)

## 2010-12-15 LAB — BASIC METABOLIC PANEL
BUN: 7 mg/dL (ref 6–23)
CO2: 25 mEq/L (ref 19–32)
Chloride: 109 mEq/L (ref 96–112)
GFR calc Af Amer: >90 mL/min (ref >90)
Glucose, Bld: 124 mg/dL — ABNORMAL HIGH (ref 70–99)
Potassium: 3.9 mEq/L (ref 3.5–5.1)

## 2010-12-15 LAB — GLUCOSE, CAPILLARY: Glucose-Capillary: 107 mg/dL — ABNORMAL HIGH (ref 70–99)

## 2010-12-15 LAB — CBC
HCT: 32.2 % — ABNORMAL LOW (ref 36.0–46.0)
Hemoglobin: 10.2 g/dL — ABNORMAL LOW (ref 12.0–15.0)
WBC: 11.7 10*3/uL — ABNORMAL HIGH (ref 4.0–10.5)

## 2010-12-15 MED ORDER — AMITRIPTYLINE HCL 50 MG PO TABS
50.0000 mg | ORAL_TABLET | Freq: Every day | ORAL | Status: DC
  Administered 2010-12-15 – 2010-12-16 (×2): 50 mg via ORAL
  Filled 2010-12-15 (×3): qty 1

## 2010-12-15 MED ORDER — TOPIRAMATE 25 MG PO TABS
25.0000 mg | ORAL_TABLET | Freq: Every day | ORAL | Status: DC
  Administered 2010-12-16 – 2010-12-17 (×2): 25 mg via ORAL
  Filled 2010-12-15 (×2): qty 1

## 2010-12-15 MED ORDER — METFORMIN HCL 500 MG PO TABS
1000.0000 mg | ORAL_TABLET | Freq: Two times a day (BID) | ORAL | Status: DC
  Administered 2010-12-15 – 2010-12-17 (×4): 1000 mg via ORAL
  Filled 2010-12-15 (×6): qty 2

## 2010-12-15 MED ORDER — CARVEDILOL 12.5 MG PO TABS
12.5000 mg | ORAL_TABLET | Freq: Two times a day (BID) | ORAL | Status: DC
  Administered 2010-12-15 – 2010-12-17 (×4): 12.5 mg via ORAL
  Filled 2010-12-15 (×6): qty 1

## 2010-12-15 MED ORDER — CYCLOSPORINE 0.05 % OP EMUL
1.0000 [drp] | Freq: Two times a day (BID) | OPHTHALMIC | Status: DC
  Administered 2010-12-15 – 2010-12-17 (×4): 1 [drp] via OPHTHALMIC
  Filled 2010-12-15 (×5): qty 1

## 2010-12-15 MED ORDER — SIMVASTATIN 20 MG PO TABS
20.0000 mg | ORAL_TABLET | Freq: Every day | ORAL | Status: DC
  Administered 2010-12-15 – 2010-12-16 (×2): 20 mg via ORAL
  Filled 2010-12-15 (×3): qty 1

## 2010-12-15 MED ORDER — NITROGLYCERIN 0.4 MG/SPRAY TL SOLN: 1.0000 | Status: DC | PRN

## 2010-12-15 MED ORDER — DIAZEPAM 5 MG PO TABS: 5.0000 mg | ORAL_TABLET | Freq: Four times a day (QID) | ORAL | Status: DC | PRN

## 2010-12-15 MED ORDER — NITROGLYCERIN 0.4 MG SL SUBL: 0.4000 mg | SUBLINGUAL_TABLET | SUBLINGUAL | Status: DC | PRN

## 2010-12-15 MED ORDER — OLOPATADINE HCL 0.1 % OP SOLN
1.0000 [drp] | Freq: Every day | OPHTHALMIC | Status: DC
  Administered 2010-12-15 – 2010-12-17 (×3): 1 [drp] via OPHTHALMIC
  Filled 2010-12-15: qty 5

## 2010-12-15 MED ORDER — ISOSORBIDE MONONITRATE ER 60 MG PO TB24
60.0000 mg | ORAL_TABLET | Freq: Two times a day (BID) | ORAL | Status: DC
  Administered 2010-12-15 – 2010-12-17 (×4): 60 mg via ORAL
  Filled 2010-12-15 (×5): qty 1

## 2010-12-15 MED ORDER — GLIMEPIRIDE 2 MG PO TABS
2.0000 mg | ORAL_TABLET | Freq: Two times a day (BID) | ORAL | Status: DC
  Administered 2010-12-15 – 2010-12-17 (×4): 2 mg via ORAL
  Filled 2010-12-15 (×6): qty 1

## 2010-12-15 MED ORDER — LINAGLIPTIN 5 MG PO TABS
5.0000 mg | ORAL_TABLET | Freq: Every day | ORAL | Status: DC
  Administered 2010-12-16 – 2010-12-17 (×2): 5 mg via ORAL
  Filled 2010-12-15 (×2): qty 1

## 2010-12-15 MED ORDER — LOSARTAN POTASSIUM 50 MG PO TABS
100.0000 mg | ORAL_TABLET | Freq: Every day | ORAL | Status: DC
  Administered 2010-12-16 – 2010-12-17 (×2): 100 mg via ORAL
  Filled 2010-12-15 (×2): qty 2

## 2010-12-15 MED ORDER — PANTOPRAZOLE SODIUM 40 MG PO TBEC
40.0000 mg | DELAYED_RELEASE_TABLET | Freq: Every day | ORAL | Status: DC
  Administered 2010-12-16 – 2010-12-17 (×2): 40 mg via ORAL
  Filled 2010-12-15 (×2): qty 1

## 2010-12-15 MED ORDER — WARFARIN SODIUM 7.5 MG PO TABS
7.5000 mg | ORAL_TABLET | Freq: Once | ORAL | Status: AC
  Administered 2010-12-15: 7.5 mg via ORAL
  Filled 2010-12-15: qty 1

## 2010-12-15 MED ORDER — TOPIRAMATE 100 MG PO TABS
100.0000 mg | ORAL_TABLET | Freq: Every day | ORAL | Status: DC
  Filled 2010-12-15 (×2): qty 1

## 2010-12-15 NOTE — Op Note (Signed)
[name], STINES                 ACCOUNT NO.:  [id number]  MEDICAL RECORD NO.:  [id number]  LOCATION:  5028                         FACILITY:  [facility]  PHYSICIAN:  Zanden Colver C. Ophelia Charter, M.D.    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  PROCEDURE:  Left total hip arthroplasty revision, polyethylene liner exchange.  SURGEON:  Shanaya Schneck C. Ophelia Charter, MD  ASSISTANT:  Maud Deed, PA-C.  ANESTHESIA:  GOT.  BRIEF HISTORY:  This patient had a previous total hip arthroplasty on the left done in 1991 or 92.  She had a later revision within 3-4 years. She states she had some sort of mismatch problem and had a large amount of debris removed from her hip which she states was white or yellow, but the hip is not infected.  She had revision procedure performed and has done well until the last year which she has had gradually increasing pain in her hip.  She has had aspiration negative for cultures, cell count, differential, no crystals, sed rate, and CRP.  She had no evidence of infection, and she has had some evidence of marginal bone resorption.  PROCEDURE:  After induction of general anesthesia, the patient was placed in lateral position, standard prepping and draping was performed. The patient was placed in lateral position and a Foley catheter. Preoperative Ancef was given 2 g.  Prepping with DuraPrep in the usual split sheets, drapes, impervious stockinette, Coban, sterile skin marker, Betadine, Steri-Drape x2 sealing the skin.  Time-out procedure was completed.  Posterior approach was made.  Old incision was opened, quite extending the entire length.  After splitting the tensor fascia, short external rotator and capsule was  opened.  There was clear fluid. No culture was obtained since she had previous culture preoperatively. There was synovial thickening consistent with polyethylene debris, discoloration of polyethylene in eccentric wear in the weightbearing portion.  There was some sclerosis at the ball, neck junction, and this was popped off, black sclerosis was noted on the ball and this ball was popped off, it fell on the floor and it was put in the sterilized cooker.  Polyethylene was removed initially with an osteotome and then with the polyethylene removal device holding it tight and then twisting as it dislodged it.  The locking ring mechanism was still present.  This is a tri-spike DePuy cup.  Trial head was tried on and the trunnion was different, and soft tissue was removed around the neck.  The stem was not loose; however, the geometry of the proximal portion of the stem showed that this was not a diffuse stem.  There was a Tri-Spike liner that had been placed in [place] and it was apparent that this was a different type of stem and 2 trial sizes for AML stems, which the radiograph appeared to be in AML even the small one did not fit with a small taper.  Newman Pies was then sterilized  for 10 minutes, came out smoking and it was too hot to touch.  The sterilizer capsule had changed appropriately confirming sterilization, it was cooled down in saline solution.  The new ring locker for a 48 mm acetabulum was inserted. Poly was opened and impacted into place.  Newman Pies was then popped back on, and it was apparent that this was a different prosthesis with a much narrower trunnion, and it was impacted well, grabbed, pulled on, and there was no instability with good fixation of the [facility] taper.  The hip was reduced.  There was good stability, and then standard layered closure with tensor fascia closure, 2-0 Vicryl, subcutaneous tissue, subcuticular skin closure, tincture of benzoin, postop dressing, and  knee immobilizer.     Haydn Hutsell C. Ophelia Charter, M.D.     MCY/MEDQ  D:  12/14/2010  T:  12/14/2010  Job:  [id number]

## 2010-12-15 NOTE — Progress Notes (Signed)
ANTICOAGULATION CONSULT NOTE - Follow Up Consult  Pharmacy Consult for coumadin Indication: VTE prophylaxis Labs:  [facility] 12/15/10 0642  HGB 10.2*  HCT 32.2*  PLT 295  APTT --  LABPROT 13.9  INR 1.05  HEPARINUNFRC --  CREATININE 0.76  CKTOTAL --  CKMB --  TROPONINI --    Assessment: 65 YOF S/P total hip revision on coumadin for VTE px. INR subtherapeutic, no bleeding noted per chart.  Goal of Therapy:  INR 2-3   Plan:  1) coumadin 7.5mg  po x1 2) f/u INR  Bayard Hugger Wang 12/15/2010,11:06 AM

## 2010-12-15 NOTE — Progress Notes (Signed)
Subjective: Postop  Hip revision   Objective: Vital signs in last 24 hours: Temp:  [98.1 F (36.7 C)-101.1 F (38.4 C)] 101.1 F (38.4 C) (11/27 1610) Pulse Rate:  [85-98] 96  (11/27 0633) Resp:  [5-25] 19  (11/27 0633) BP: (115-144)/(61-84) 134/75 mmHg (11/27 0633) SpO2:  [95 %-100 %] 97 % (11/27 0633) FiO2 (%):  [2 %-97 %] 97 % (11/27 0420) Weight:  [99.338 kg (219 lb)] 219 lb (99.338 kg) (11/26 1754)  Intake/Output from previous day: 11/26 0701 - 11/27 0700 In: 2513.8 [I.V.:2513.8] Out: 580 [Urine:380; Blood:200] Intake/Output this shift:    No results found for this basename: HGB:5 in the last 72 hours No results found for this basename: WBC:2,RBC:2,HCT:2,PLT:2 in the last 72 hours No results found for this basename: NA:2,K:2,CL:2,CO2:2,BUN:2,CREATININE:2,GLUCOSE:2,CALCIUM:2 in the last 72 hours No results found for this basename: LABPT:2,INR:2 in the last 72 hours  Neurologically intact   NVI sciatic intact, pain controlled. Assessment/Plan: Post op poly liner exchange.  Plan therapy and mobilization  YATES,MARK C 12/15/2010, 7:09 AM

## 2010-12-15 NOTE — Progress Notes (Signed)
Occupational Therapy Evaluation Patient Details Name: [name] MRN: [id number] DOB: 1945-10-22 Today's Date: 12/15/2010 EV@   [id number] Problem List:  Patient Active Problem List  Diagnoses  . DYSLIPIDEMIA  . ESSENTIAL HYPERTENSION, BENIGN  . SYNCOPE  . S/P revision of total hip    Past Medical History:  Past Medical History  Diagnosis Date  . Myocardial infarction 98  . Dysrhythmia     arrythmia and spasms occ none for 8-9 months drf frasier  seeing me while dr sprull out with back  . Angina     none recently  . Asthma   . Blood transfusion   . GERD (gastroesophageal reflux disease)   . Hiatal hernia   . Diverticulitis   . Diabetes mellitus     type 2   Past Surgical History:  Past Surgical History  Procedure Date  . Abdominal hysterectomy   . Joint replacement     lsft hip x2, rt knee  . Knee arthroscopy     x2  . Foot surgery     bil feet great toes and second toes spurs  . Breast surgery     left bx  . Tonsillectomy   . Back surgery     x2 one for infection  . Cholecystectomy     OT Assessment/Plan/Recommendation OT Assessment Clinical Impression Statement: Pt s/p L THR revision who has dificits in areas of pain management, mobility and activitiy tolerace who would benefit from continued inpatient OT to increase independence with basic ADLS and ADL transfers and educate patient on adaptive equipment options. OT Recommendation/Assessment: Patient will need skilled OT in the acute care venue OT Problem List: Decreased activity tolerance;Decreased strength;Decreased knowledge of use of DME or AE;Decreased knowledge of precautions;Pain Barriers to Discharge: Decreased caregiver support Barriers to Discharge Comments: Pt feels she can d/c home alone.  Pt has aid 7 days a week for 3 hours in the morning and can get church members to assist as well.  Pt does not want to go to rehab. OT Therapy Diagnosis : Generalized weakness;Acute pain OT Plan OT Frequency:  Min 2X/week OT Treatment/Interventions: Self-care/ADL training;DME and/or AE instruction;Therapeutic activities OT Recommendation Follow Up Recommendations: Home health OT Equipment Recommended: Other (comment) (adaptive equipment pack if pt decides it would be helpful.) Individuals Consulted Consulted and Agree with Results and Recommendations: Patient OT Goals Acute Rehab OT Goals OT Goal Formulation: With patient Time For Goal Achievement: 7 days ADL Goals Pt Will Perform Grooming: with supervision;Standing at sink ADL Goal: Grooming - Progress: Progressing toward goals Pt Will Perform Lower Body Bathing: with min assist;Standing at sink;Sitting at sink (with long sponge) ADL Goal: Lower Body Bathing - Progress: Progressing toward goals Pt Will Perform Lower Body Dressing: with min assist;with adaptive equipment;Sit to stand from chair ADL Goal: Lower Body Dressing - Progress: Progressing toward goals Pt Will Perform Tub/Shower Transfer: with min assist;Shower seat with back ADL Goal: Tub/Shower Transfer - Progress: Progressing toward goals Additional ADL Goal #1: PT will complete all aspects of toileting with Supervision and a elevated commode seat. ADL Goal: Additional Goal #1 - Progress: Progressing toward goals  OT Evaluation Precautions/Restrictions  Precautions Precautions: Posterior Hip Precaution Comments: Pt familiar with precautions from other THRs but needed reminders Required Braces or Orthoses: Yes Knee Immobilizer: On at all times;Other (comment) (reminder of precautions) Restrictions Weight Bearing Restrictions: Yes LLE Weight Bearing: Weight bearing as tolerated Prior Functioning Home Living Lives With: Alone Receives Help From: Personal care attendant;Other (Comment) (church members)  Type of Home: Apartment Home Layout: One level Home Access: Level entry Bathroom Shower/Tub: Tub/shower unit;Curtain Bathroom Toilet: Standard Bathroom Accessibility:  Yes How Accessible: Accessible via walker Home Adaptive Equipment: Raised toilet seat with rails;Shower chair with back;Straight cane;Walker - rolling Prior Function Level of Independence: Needs assistance with ADLs;Needs assistance with homemaking Bath: Minimal Dressing: Minimal Homemaking Assistance Comments: Pts aid fixes breakfast and lunch.  Church members bring dinner. Driving: No Vocation: Retired Leisure: Hobbies-yes (Comment) Comments: Very invoved in her church community ADL ADL Eating/Feeding: Simulated;Set up Where Assessed - Eating/Feeding: Edge of bed Grooming: Performed;Teeth care;Wash/dry face;Wash/dry hands;Set up Where Assessed - Grooming: Sitting, bed Upper Body Bathing: Simulated;Set up Where Assessed - Upper Body Bathing: Sitting, bed Lower Body Bathing: Simulated;Maximal assistance Lower Body Bathing Details (indicate cue type and reason): assist for below knees and to stand to reach bottom. Where Assessed - Lower Body Bathing: Sit to stand from chair Upper Body Dressing: Simulated;Set up Where Assessed - Upper Body Dressing: Sitting, chair Lower Body Dressing: Simulated;Maximal assistance Lower Body Dressing Details (indicate cue type and reason): Pt needs assist to get socks/shoes on and start pants over feet.  Pt is familiar with sock aid/reacher but states she cannot purchase them and her caregiver is there 7 days a week to assist her with adls. Where Assessed - Lower Body Dressing: Sit to stand from chair Toilet Transfer: Performed;Moderate assistance Toilet Transfer Details (indicate cue type and reason): mod assist was for sit to stand; otherwise min assist. Toilet Transfer Method: Stand pivot Toilet Transfer Equipment: Bedside commode;Grab bars Toileting - Clothing Manipulation: Performed;Minimal assistance Toileting - Clothing Manipulation Details (indicate cue type and reason): assist to maintain balance while reaching to pull pants up. Where Assessed  - Toileting Clothing Manipulation: Sit to stand from 3-in-1 or toilet Toileting - Hygiene: Simulated;Supervision/safety Where Assessed - Toileting Hygiene: Sit on 3-in-1 or toilet Tub/Shower Transfer: Not assessed Tub/Shower Transfer Method: Other (comment) (discussed technique for home.) ADL Comments: Pt has caregiver to assist with most adls but will re educate pt on equipment use since last hip replacement was in 1998. Vision/Perception    Cognition   Sensation/Coordination   Extremity Assessment RUE Assessment RUE Assessment: Within Functional Limits LUE Assessment LUE Assessment: Within Functional Limits Mobility  Bed Mobility Bed Mobility: Yes Sit to Supine - Left: 3: Mod assist;With rail;HOB elevated (comment degrees) (HOB at 45 degrees) Transfers Transfers: Yes Sit to Stand: 3: Mod assist;From bed;From elevated surface Stand to Sit: 4: Min assist;To chair/3-in-1 Stand to Sit Details: cues to put Left leg out before sitting. Exercises   End of Session OT - End of Session Equipment Utilized During Treatment: Left knee immobilizer Activity Tolerance: Patient tolerated treatment well;Patient limited by pain Patient left: in chair;with call bell in reach Nurse Communication: Mobility status for transfers General Behavior During Session: [facility] for tasks performed Cognition: [facility] for tasks performed   Hope Budds  [phone] 12/15/2010, 11:01 AM

## 2010-12-15 NOTE — Progress Notes (Signed)
Physical Therapy Evaluation Patient Details Name: [name] MRN: [id number] DOB: 12/04/1945 Today's Date: 12/15/2010  Problem List:  Patient Active Problem List  Diagnoses  . DYSLIPIDEMIA  . ESSENTIAL HYPERTENSION, BENIGN  . SYNCOPE  . S/P revision of total hip    Past Medical History:  Past Medical History  Diagnosis Date  . Myocardial infarction 98  . Dysrhythmia     arrythmia and spasms occ none for 8-9 months drf frasier  seeing me while dr sprull out with back  . Angina     none recently  . Asthma   . Blood transfusion   . GERD (gastroesophageal reflux disease)   . Hiatal hernia   . Diverticulitis   . Diabetes mellitus     type 2   Past Surgical History:  Past Surgical History  Procedure Date  . Abdominal hysterectomy   . Joint replacement     lsft hip x2, rt knee  . Knee arthroscopy     x2  . Foot surgery     bil feet great toes and second toes spurs  . Breast surgery     left bx  . Tonsillectomy   . Back surgery     x2 one for infection  . Cholecystectomy   . Total hip revision 12/14/2010    Procedure: TOTAL HIP REVISION;  Surgeon: Eldred Manges;  Location: [facility] OR;  Service: Orthopedics;  Laterality: Left;  Left Total Hip Arthroplasty Revision, Poly and Ball Exchange, Possible Acetabular Revision vs. Poly Exchange    PT Assessment/Plan/Recommendation PT Assessment Clinical Impression Statement: 65 yo female s/p LTHA Revision presents with decreased functional mobility/independence and the impairments listed below; will benefit from acute PT services to maximize Independe3nce and safety with mobility/transfer/amb/bed mobility to enable pt to safely dc home; pt must be mod I with all mobility to dc hom eas she lives alone PT Recommendation/Assessment: Patient will need skilled PT in the acute care venue PT Problem List: Decreased strength;Decreased range of motion;Decreased activity tolerance;Decreased mobility;Decreased coordination;Decreased knowledge of  use of DME;Decreased knowledge of precautions;Pain;Obesity Barriers to Discharge: Decreased caregiver support Barriers to Discharge Comments: must be mod independent to dc home PT Therapy Diagnosis : Difficulty walking;Abnormality of gait;Acute pain PT Plan PT Frequency: 7X/week PT Treatment/Interventions: DME instruction;Gait training;Stair training;Functional mobility training;Therapeutic exercise;Patient/family education PT Recommendation Recommendations for Other Services: OT consult Follow Up Recommendations: Home health PT (if slow progress, must consider SNF for rehab) Equipment Recommended: None recommended by PT PT Goals  Acute Rehab PT Goals PT Goal Formulation: With patient Time For Goal Achievement: 7 days Pt will go Supine/Side to Sit: with modified independence (while maintaining posterior hip prec) PT Goal: Supine/Side to Sit - Progress: Progressing toward goal Pt will go Sit to Supine/Side: with modified independence (while maintaining posterior hip prec) PT Goal: Sit to Supine/Side - Progress: Progressing toward goal Pt will Transfer Sit to Stand/Stand to Sit: with modified independence (while maintaining posterior hip prec) PT Transfer Goal: Sit to Stand/Stand to Sit - Progress: Progressing toward goal Pt will Ambulate: >150 feet;with modified independence;with rolling walker (while maintaining posterior hip prec) PT Goal: Ambulate - Progress: Progressing toward goal Pt will Go Up / Down Stairs: 1-2 stairs;with rolling walker;with modified independence (while maintaining posterior hip prec) PT Goal: Up/Down Stairs - Progress: Other (comment) Pt will Perform Home Exercise Program: Independently PT Goal: Perform Home Exercise Program - Progress: Progressing toward goal  PT Evaluation Precautions/Restrictions  Precautions Precautions: Posterior Hip Precaution Comments: Pt familiar with  precautions from other THRs but needed reminders Restrictions LLE Weight Bearing:  Weight bearing as tolerated Prior Functioning  Home Living Lives With: Alone Receives Help From: Personal care attendant;Other (Comment) Type of Home: Apartment Home Layout: One level Home Access: Level entry Bathroom Shower/Tub: Tub/shower unit;Curtain Bathroom Toilet: Standard Home Adaptive Equipment: Raised toilet seat with rails;Shower chair with back;Straight cane;Walker - rolling Prior Function Level of Independence: Needs assistance with ADLs;Needs assistance with homemaking Vocation: Retired Financial risk analyst Arousal/Alertness: Awake/alert Overall Cognitive Status:  (requires cues to remain on task) Orientation Level: Oriented X4 Cognition - Other Comments: sleepy, with cues to keep eyes open Sensation/Coordination Sensation Light Touch: Appears Intact Coordination Gross Motor Movements are Fluid and Coordinated: No (Inefficient scooting to edge of chair and with sit to supine) Fine Motor Movements are Fluid and Coordinated: Not tested Coordination and Movement Description: mod cues to initiate movement Extremity Assessment RUE Assessment RUE Assessment: Within Functional Limits LUE Assessment LUE Assessment: Within Functional Limits RLE Assessment RLE Assessment:  (audible crepitus) LLE Assessment LLE Assessment: Exceptions to [facility] LLE Strength LLE Overall Strength Comments: Overall decr ROM and strength limited by postop pain Mobility (including Balance) Bed Mobility Sit to Supine - Left: 3: Mod assist;With rail;HOB elevated (comment degrees) Sit to Supine - Left Details (indicate cue type and reason): cues to initiate movement, to scoot for optimal positioning in bed, to elbow prop in transistioning to supine; maintain Post hip prec; numerous "rest breaks" during sit to supine Transfers Transfers: Yes Sit to Stand: 1: +2 Total assist;Patient percentage (comment);From chair/3-in-1 (pt=45%) Sit to Stand Details (indicate cue type and reason): cues for  pre-positioning for post hip prec; cues for safe hand placement/ to push off chair from armrests; required significant physical assist for initial lift-off Stand to Sit: 4: Min assist;To bed;With upper extremity assist Stand to Sit Details: cues for post hip prec and safe hand placement Ambulation/Gait Ambulation/Gait: Yes Ambulation/Gait Assistance: 4: Min assist Ambulation/Gait Assistance Details (indicate cue type and reason): cues for hand placement in RW; sequence; upright posture; and to maintain post hip prec during turns Ambulation Distance (Feet): 85 Feet Assistive device: Rolling walker;Other (Comment) (plus one for iv pole and to push chair behind for safety) Gait Pattern: Decreased stride length;Decreased stance time - left;Antalgic (mostly step-through pattern, especially with incr distance)  Posture/Postural Control Posture/Postural Control: No significant limitations Exercise  Total Joint Exercises Quad Sets: Left;Seated;5 reps;AROM Gluteal Sets: AROM;5 reps;Both;Seated Towel Squeeze: 5 reps;AROM;Left;Seated Heel Slides: AAROM;Left;5 reps;Seated Hip ABduction/ADduction: AAROM;5 reps;Seated;Left End of Session PT - End of Session Equipment Utilized During Treatment: Gait belt Activity Tolerance: Patient tolerated treatment well (ambulated well; sleepy with bed mobility at end session) Patient left: in bed;with call bell in reach Nurse Communication: Mobility status for ambulation;Mobility status for transfers General Behavior During Session: [facility] for tasks performed Cognition: [facility] for tasks performed  Van Clines [facility] [place], [place] [phone]  12/15/2010, 4:14 PM

## 2010-12-16 LAB — GLUCOSE, CAPILLARY
Glucose-Capillary: 117 mg/dL — ABNORMAL HIGH (ref 70–99)
Glucose-Capillary: 100 mg/dL — ABNORMAL HIGH (ref 70–99)
Glucose-Capillary: 127 mg/dL — ABNORMAL HIGH (ref 70–99)
Glucose-Capillary: 102 mg/dL — ABNORMAL HIGH (ref 70–99)

## 2010-12-16 LAB — PROTIME-INR
INR: 1.19 (ref 0.00–1.49)
Prothrombin Time: 15.4 seconds — ABNORMAL HIGH (ref 11.6–15.2)

## 2010-12-16 LAB — CBC
HCT: 30.8 % — ABNORMAL LOW (ref 36.0–46.0)
MCHC: 30.8 g/dL (ref 30.0–36.0)
RDW: 15.8 % — ABNORMAL HIGH (ref 11.5–15.5)

## 2010-12-16 MED ORDER — WARFARIN SODIUM 7.5 MG PO TABS
7.5000 mg | ORAL_TABLET | Freq: Once | ORAL | Status: AC
  Administered 2010-12-16: 7.5 mg via ORAL
  Filled 2010-12-16: qty 1

## 2010-12-16 NOTE — Progress Notes (Signed)
Occupational Therapy Treatment Patient Details Name: [name] MRN: [id number] DOB: Apr 01, 1945 Today's Date: 12/16/2010  OT Assessment/Plan OT Assessment/Plan Comments on Treatment Session: Pt with plans to sleep in recliner due to dependence in bed mobility. OT Plan: Discharge plan remains appropriate OT Frequency: Min 2X/week Follow Up Recommendations: Home health OT Equipment Recommended: None recommended by OT OT Goals Acute Rehab OT Goals OT Goal Formulation: With patient Time For Goal Achievement: 7 days ADL Goals Pt Will Perform Grooming: with supervision;Standing at sink ADL Goal: Grooming - Progress: Met Pt Will Perform Lower Body Bathing: with min assist;Standing at sink;Sitting at sink ADL Goal: Lower Body Bathing - Progress: Progressing toward goals Pt Will Perform Lower Body Dressing: with min assist;with adaptive equipment;Sit to stand from chair ADL Goal: Lower Body Dressing - Progress: Progressing toward goals Pt Will Perform Tub/Shower Transfer: with min assist;Shower seat with back ADL Goal: Tub/Shower Transfer - Progress: Not addressed Additional ADL Goal #1: PT will complete all aspects of toileting with Supervision and a elevated commode seat. ADL Goal: Additional Goal #1 - Progress: Progressing toward goals  OT Treatment Precautions/Restrictions  Precautions Precautions: Posterior Hip Precaution Comments:  (Pt able to state 2/3 hip precautions.) Required Braces or Orthoses: No Restrictions Weight Bearing Restrictions: Yes LLE Weight Bearing: Weight bearing as tolerated   ADL ADL Grooming: Performed;Wash/dry hands;Supervision/safety Where Assessed - Grooming: Standing at sink Lower Body Bathing: Simulated;Moderate assistance (instructed and issued long handled bath sponge) Where Assessed - Lower Body Bathing: Sit to stand from chair;Sitting, chair Lower Body Dressing: Minimal assistance;Performed (instructed and issued sock aide and long shoe  horn) Lower Body Dressing Details (indicate cue type and reason): Pt has a reacher at home, reviewed use for LB ADL. Where Assessed - Lower Body Dressing: Sit to stand from chair;Sitting, chair Toilet Transfer: Performed;Supervision/safety Toilet Transfer Method: Proofreader: Raised toilet seat with arms (or 3-in-1 over toilet) Toileting - Hygiene: Set up;Performed Where Assessed - Toileting Hygiene: Sit on 3-in-1 or toilet Equipment Used: Rolling walker ADL Comments:  (Pt also has walker bag--reviewed use to transport items.) Mobility  Bed Mobility Bed Mobility: No Supine to Sit: 2: Max assist Supine to Sit Details (indicate cue type and reason): verbal and tactile cues given to maintain Post. Hip Precautions; ineffecient scooting, time consuming, unable to effectively bridge hips to EOB with non-surgical limb, required verbal and tactile cues and max assist to initiate trunk and shoulder elevation off of bed Sitting - Scoot to Edge of Bed: 2: Max assist Sitting - Scoot to [place] of Bed Details (indicate cue type and reason): Inefficient scooting due to decreased bilateral weight shifting, decreased strength and overall deconditioning Transfers Sit to Stand: 5: Supervision;With upper extremity assist;With armrests;From chair/3-in-1 (required extra time and some minimal momentum) Sit to Stand Details (indicate cue type and reason):  (several blankets under pt in recliner) Stand to Sit: 5: Supervision;With upper extremity assist;To elevated surface;To chair/3-in-1 Stand to Sit Details: multiple reps; elevated recliner seat height to approximate home; cues (verbal and tactile) for posterior hip precautions  End of Session OT - End of Session Equipment Utilized During Treatment: Gait belt Activity Tolerance: Patient tolerated treatment well Patient left: in chair;with call bell in reach Nurse Communication: Mobility status for transfers General Behavior During  Session: [facility] for tasks performed Cognition: [facility] for tasks performed  Evern Bio  12/16/2010, 2:51 PM [phone]

## 2010-12-16 NOTE — Progress Notes (Signed)
Subjective: Post op day 2 hip revision left with poly exchange AMBULATION IN HALL. OOB TO CHAIR2   Objective: Vital signs in last 24 hours: Temp:  [99.5 F (37.5 C)-100.8 F (38.2 C)] 100.8 F (38.2 C) (11/28 0617) Pulse Rate:  [97-105] 97  (11/28 0617) Resp:  [18-20] 20  (11/28 0617) BP: (115-129)/(66-73) 119/69 mmHg (11/28 0617) SpO2:  [95 %-100 %] 100 % (11/28 0617)  Intake/Output from previous day: 11/27 0701 - 11/28 0700 In: 1761.3 [I.V.:1761.3] Out: 2600 [Urine:2600] Intake/Output this shift:     Basename 12/16/10 0600 12/15/10 0642  HGB 9.5* 10.2*    Basename 12/16/10 0600 12/15/10 0642  WBC 13.8* 11.7*  RBC 3.70* 3.84*  HCT 30.8* 32.2*  PLT 272 295    Basename 12/15/10 0642  NA 144  K 3.9  CL 109  CO2 25  BUN 7  CREATININE 0.76  GLUCOSE 124*  CALCIUM 8.7    Basename 12/15/10 0642  LABPT --  INR 1.05    Neurologically intact ABD soft Neurovascular intact Dorsiflexion/Plantar flexion intact No cellulitis present  Assessment/Plan: D/C PCA   , O2 and pulse ox   Likely home tomorrow.    Taniya Dasher C 12/16/2010, 7:12 AM

## 2010-12-16 NOTE — Progress Notes (Signed)
ANTICOAGULATION CONSULT NOTE - Follow Up Consult  Pharmacy Consult for coumadin Indication: VTE prophylaxis  Allergies  Allergen Reactions  . Contrast Media (Iodinated Diagnostic Agents) Anaphylaxis  . Aspirin Nausea And Vomiting  . Gadolinium Other (See Comments)     Desc: throat swells   . Iohexol Other (See Comments)    unknown    Patient Measurements: Height: 5\' 5"  (165.1 cm) Weight: 219 lb (99.338 kg) IBW/kg (Calculated) : 57   Vital Signs: Temp: 100.8 F (38.2 C) (11/28 0617) BP: 119/69 mmHg (11/28 0617) Pulse Rate: 97  (11/28 0617)  Labs:  Basename 12/16/10 0600 12/15/10 0642  HGB 9.5* 10.2*  HCT 30.8* 32.2*  PLT 272 295  APTT -- --  LABPROT 15.4* 13.9  INR 1.19 1.05  HEPARINUNFRC -- --  CREATININE -- 0.76  CKTOTAL -- --  CKMB -- --  TROPONINI -- --   Estimated Creatinine Clearance: 84 ml/min (by C-G formula based on Cr of 0.76).   Medications:  Scheduled:    . amitriptyline  50 mg Oral QHS  . carvedilol  12.5 mg Oral BID WC  . cycloSPORINE  1 drop Both Eyes BID  . docusate sodium  100 mg Oral BID  . glimepiride  2 mg Oral BID WC  . insulin aspart  0-15 Units Subcutaneous TID WC  . isosorbide mononitrate  60 mg Oral BID  . linagliptin  5 mg Oral Daily  . losartan  100 mg Oral Daily  . metFORMIN  1,000 mg Oral BID WC  . olopatadine  1 drop Both Eyes Daily  . pantoprazole  40 mg Oral Q1200  . simvastatin  20 mg Oral q1800  . topiramate  100 mg Oral Daily  . topiramate  25 mg Oral Daily  . warfarin  7.5 mg Oral ONCE-1800  . warfarin   Does not apply Once  . DISCONTD: morphine   Intravenous Q4H    Assessment: INR 1.19 with a slight rise from yesterday.  Patient apears to be doing well with no adverse drug events.  Glucose has been elevated, however.  Goal of Therapy:  INR of 2 to 3 for VTE prophylaxis.    Plan:  7.5mg  at 1800 and check INR in morning. Patient has a 'warfarin score for dosing' of 6.  Therefore, in preparation for  discharge, 7.5mg  daily may work initially and the dose may then need to be adjusted to an alternating 5/7.5mg  daily. Patient has been educated about warfarin therapy.  Walden Field B 12/16/2010,11:12 AM

## 2010-12-16 NOTE — Progress Notes (Signed)
Physical Therapy Treatment Patient Details Name: [name] MRN: [id number] DOB: 1945/09/05 Today's Date: 12/16/2010  PT Assessment/Plan  PT - Assessment/Plan Comments on Treatment Session: Improving, on track for dc home tomorrow PT Plan: Discharge plan remains appropriate PT Frequency: 7X/week Follow Up Recommendations: Home health PT Equipment Recommended: None recommended by PT PT Goals  Acute Rehab PT Goals Time For Goal Achievement: 7 days PT Goal: Supine/Side to Sit - Progress: Progressing toward goal PT Goal: Sit to Supine/Side - Progress: Progressing toward goal PT Transfer Goal: Sit to Stand/Stand to Sit - Progress: Progressing toward goal PT Goal: Ambulate - Progress: Progressing toward goal PT Goal: Up/Down Stairs - Progress:  (NT) PT Goal: Perform Home Exercise Program - Progress:  (NT)  PT Treatment Precautions/Restrictions  Precautions Precautions: Posterior Hip Precaution Comments:  (Pt able to state 2/3 hip precautions.) Required Braces or Orthoses: No Knee Immobilizer: On at all times;Other (comment) (reminder of precautions) Restrictions Weight Bearing Restrictions: Yes LLE Weight Bearing: Weight bearing as tolerated Mobility (including Balance) Bed Mobility Bed Mobility: No Supine to Sit: 4: Min assist (without physical contact) Supine to Sit Details (indicate cue type and reason): much improved (better performance likely a result of motivation to go home); continued cues for post hip prec  Sitting - Scoot to Edge of Bed: 4: Min assist (without physical contact) Sitting - Scoot to Edge of Bed Details (indicate cue type and reason): improving! Sit to Supine - Left: 4: Min assist (without physical contact) Sit to Supine - Left Details (indicate cue type and reason): improving; still needing cues for Post hip prec, especially no flexoion past 90 Transfers Sit to Stand: 4: Min assist (without physical contact) Sit to Stand Details (indicate cue type and  reason): cues for hip prec Stand to Sit: 4: Min assist (without physical contact) Stand to Sit Details: improving Ambulation/Gait Ambulation/Gait Assistance: 4: Min assist Ambulation/Gait Assistance Details (indicate cue type and reason): smooth, gait speed and efficiency improved Ambulation Distance (Feet): 110 Feet Assistive device: Rolling walker Gait Pattern: Decreased stride length Stairs: No    Exercise  Total Joint Exercises Quad Sets: Other (comment) (NT) Gluteal Sets: Other (comment) (NT) Towel Squeeze: Other (comment) (NT) Heel Slides:  (NT) Hip ABduction/ADduction:  (NT) End of Session PT - End of Session Equipment Utilized During Treatment: Gait belt Activity Tolerance: Patient tolerated treatment well Patient left: in bed;with call bell in reach;with family/visitor present Nurse Communication: Mobility status for ambulation;Mobility status for transfers General Behavior During Session: [facility] for tasks performed Cognition: [facility] for tasks performed  Van Clines [facility] [place], [place] [phone]  12/16/2010, 5:10 PM

## 2010-12-16 NOTE — Progress Notes (Signed)
Physical Therapy Treatment Patient Details Name: [name] MRN: [id number] DOB: 1945-08-12 Today's Date: 12/16/2010  PT Assessment/Plan  PT - Assessment/Plan Comments on Treatment Session: Miss Montalban is eager to go home, but her safety is limited by decreased strength, decreased bed mobility, and inefficient sit-to-stand from bedside requiring +2 Total Assist. However, Miss Pohle mentioned she does have a recliner which she could sleep in and in which she spends the majority of her day. Pt was able to demonstrate a supervision/minimal assist sit-to-stand and stand-to-sit to recliner with raised surface, although she demonstrated inefficient technique and requried verbal and tactile cues to maintain posterior hip precuations. Miss Qazi, optimally, would receive further rehab to increase lower extremity strength and bed mobility before returning home. However, she will clearly refuse this. We recommend home provided she can sleep in her recliner and receive home health PT in order to increase bed mobility.  PT Plan: Discharge plan remains appropriate PT Frequency: 7X/week Follow Up Recommendations: Home health PT Equipment Recommended: None recommended by PT PT Goals  Acute Rehab PT Goals Time For Goal Achievement: 7 days PT Goal: Supine/Side to Sit - Progress: Other (comment) (will sleep in recliner upon initial D/C) PT Goal: Sit to Supine/Side - Progress: Other (comment) (will sleep in recliner upon initial D/C) PT Transfer Goal: Sit to Stand/Stand to Sit - Progress: Progressing toward goal PT Goal: Ambulate - Progress: Progressing toward goal PT Goal: Up/Down Stairs - Progress: Other (comment) PT Goal: Perform Home Exercise Program - Progress: Other (comment)  PT Treatment Precautions/Restrictions  Precautions Precautions: Posterior Hip Precaution Comments: Pt familiar with precautions from other THRs but needed reminders Required Braces or Orthoses: No Knee Immobilizer: On at all  times;Other (comment) (reminder of precautions) Restrictions Weight Bearing Restrictions: Yes LLE Weight Bearing: Weight bearing as tolerated Mobility (including Balance) Bed Mobility Bed Mobility: Yes Supine to Sit: 2: Max assist Supine to Sit Details (indicate cue type and reason): verbal and tactile cues given to maintain Post. Hip Precautions; ineffecient scooting, time consuming, unable to effectively bridge hips to EOB with non-surgical limb, required verbal and tactile cues and max assist to initiate trunk and shoulder elevation off of bed Sitting - Scoot to Edge of Bed: 2: Max assist Sitting - Scoot to [place] of Bed Details (indicate cue type and reason): Inefficient scooting due to decreased bilateral weight shifting, decreased strength and overall deconditioning Transfers Sit to Stand: 1: +2 Total assist Sit to Stand Details (indicate cue type and reason): Performed multiple reps from multiple surfaces in effort to approximate home environment. From EOB: required verbal and tactile cues to maintain Post. Hip Precautions, +2 Total Assist. Recliner with seat height increased: minimal to supervision assist.  Stand to Sit: 4: Min assist;With upper extremity assist;To elevated surface Stand to Sit Details: multiple reps; elevated recliner seat height to approximate home; cues (verbal and tactile) for posterior hip precautions Ambulation/Gait Ambulation/Gait Assistance: 4: Min assist (without physical contact) Ambulation/Gait Assistance Details (indicate cue type and reason): steady with amb Ambulation Distance (Feet): 110 Feet Assistive device: Rolling walker Gait Pattern: Decreased stride length Stairs: No    Exercise    End of Session PT - End of Session Equipment Utilized During Treatment: Gait belt Activity Tolerance: Patient tolerated treatment well Nurse Communication: Mobility status for ambulation;Mobility status for transfers General Behavior During Session: [facility] for tasks  performed Cognition: [facility] for tasks performed  Van Clines [facility] [place], [place] [phone]  12/16/2010, 2:49 PM

## 2010-12-17 LAB — GLUCOSE, CAPILLARY: Glucose-Capillary: 97 mg/dL (ref 70–99)

## 2010-12-17 LAB — PROTIME-INR: Prothrombin Time: 16.5 seconds — ABNORMAL HIGH (ref 11.6–15.2)

## 2010-12-17 MED ORDER — OXYCODONE-ACETAMINOPHEN 5-325 MG PO TABS: 2.0000 | ORAL_TABLET | Freq: Four times a day (QID) | ORAL | Status: AC | PRN

## 2010-12-17 MED ORDER — ASPIRIN EC 325 MG PO TBEC: 325.0000 mg | DELAYED_RELEASE_TABLET | Freq: Every day | ORAL | Status: AC

## 2010-12-17 MED ORDER — METHOCARBAMOL 500 MG PO TABS: 500.0000 mg | ORAL_TABLET | Freq: Four times a day (QID) | ORAL | Status: AC | PRN

## 2010-12-17 MED ORDER — CLOPIDOGREL BISULFATE 75 MG PO TABS: 75.0000 mg | ORAL_TABLET | Freq: Every day | ORAL | Status: AC

## 2010-12-17 NOTE — Progress Notes (Deleted)
Md paged about pt's blood pressure of 208/113. MD ordered for !0 am dose of antihypertensive meds to be given at 6 am for today. Will execute orders and continue to monitor

## 2010-12-17 NOTE — Progress Notes (Signed)
Physical Therapy Treatment Patient Details Name: [name] MRN: [id number] DOB: Jun 07, 1945 Today's Date: 12/17/2010  PT Assessment/Plan  PT - Assessment/Plan Comments on Treatment Session: ok for dc from PT standpoint PT Plan: Discharge plan remains appropriate Follow Up Recommendations: Home health PT Equipment Recommended: None recommended by PT PT Goals  Acute Rehab PT Goals PT Goal: Supine/Side to Sit - Progress: Progressing toward goal PT Goal: Sit to Supine/Side - Progress: Progressing toward goal PT Transfer Goal: Sit to Stand/Stand to Sit - Progress: Progressing toward goal PT Goal: Ambulate - Progress: Progressing toward goal PT Goal: Up/Down Stairs - Progress:  (nt) PT Goal: Perform Home Exercise Program - Progress: Progressing toward goal  PT Treatment Precautions/Restrictions  Precautions Precautions: Posterior Hip Precaution Comments:  (Pt able to state 2/3 hip precautions.) Required Braces or Orthoses: No Knee Immobilizer: On at all times;Other (comment) (reminder of precautions) Restrictions Weight Bearing Restrictions: Yes LLE Weight Bearing: Weight bearing as tolerated Mobility (including Balance) Bed Mobility Supine to Sit: 4: Min assist (minguard A without physical contact) Supine to Sit Details (indicate cue type and reason): cues for correct post hip prec Sitting - Scoot to Edge of Bed: 5: Supervision Sitting - Scoot to Edge of Bed Details (indicate cue type and reason): cues for correct post hip prec Transfers Sit to Stand: 5: Supervision (cues for correct post hip prec) Sit to Stand Details (indicate cue type and reason): cues also for hand placement Stand to Sit: 5: Supervision (cues for correct post hip prec) Ambulation/Gait Ambulation/Gait Assistance: 5: Supervision Ambulation/Gait Assistance Details (indicate cue type and reason): cues for correct post hip prec, esp with turns Ambulation Distance (Feet): 120 Feet Assistive device: Rolling  walker Gait Pattern: Decreased stride length    Exercise  Total Joint Exercises Quad Sets: Left;Supine;AROM;10 reps Towel Squeeze: AROM;10 reps;Left;Supine Heel Slides: AROM;Left;10 reps;Supine Hip ABduction/ADduction: AAROM;10 reps;Supine;Left End of Session PT - End of Session Equipment Utilized During Treatment: Gait belt Activity Tolerance: Patient tolerated treatment well Patient left: in chair;with call bell in reach General Behavior During Session: [facility] for tasks performed Cognition: [facility] for tasks performed  Van Clines [facility] [place],  [phone]  12/17/2010, 1:17 PM

## 2010-12-17 NOTE — Discharge Summary (Signed)
Patient ID: [name] MRN: [id number] DOB/AGE: 26-Dec-1945 65 y.o.  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 Myocardial infarction 98  . Dysrhythmia     arrythmia and spasms occ none for 8-9 months drf frasier  seeing me while dr sprull out with back  . Angina     none recently  . Asthma   . Blood transfusion   . GERD (gastroesophageal reflux disease)   . Hiatal hernia   . Diverticulitis   . Diabetes mellitus     type 2    Surgeries: Procedure(s): TOTAL HIP REVISION on 12/14/2010   Consultants:    Discharged Condition: Improved  Hospital Course: [name] is an 65 y.o. female who was admitted 12/14/2010 for operative treatment ofS/P revision of total hip. Patient has severe unremitting pain that affects sleep, daily activities, and work/hobbies. After pre-op clearance the patient was taken to the operating room on 12/14/2010 and underwent  Procedure(s): TOTAL HIP REVISION.    Patient was given perioperative antibiotics: Anti-infectives     Start     Dose/Rate Route Frequency Ordered Stop   12/14/10 2000   ceFAZolin (ANCEF) IVPB 1 g/50 mL premix        1 g 100 mL/hr over 30 Minutes Intravenous Every 6 hours 12/14/10 1744 12/15/10 0816   12/14/10 1035   ceFAZolin (ANCEF) 2-3 GM-% IVPB SOLR     Comments: Elliott, Beth: cabinet override         12/14/10 1035 12/14/10 2244   12/13/10 1130   ceFAZolin (ANCEF) IVPB 1 g/50 mL premix  Status:  Discontinued        1 g 100 mL/hr over 30 Minutes Intravenous 60 min pre-op 12/13/10 1128 12/14/10 1716           Patient was given sequential compression devices, early ambulation, and chemoprophylaxis to prevent DVT.  Patient benefited maximally from hospital stay and there were no complications.    Recent  vital signs: Patient Vitals for the past 24 hrs:  BP Temp Pulse Resp SpO2  12/17/10 0627 112/65 mmHg 97.4 F (36.3 C) 86  19  95 %  12-17-10 2213 117/68 mmHg 100.3 F (37.9 C) 99  19  98 %  December 17, 2010 1300 117/69 mmHg 99.7 F (37.6 C) 104  18  98 %     Recent laboratory studies:  Basename 12/17/10 0625 December 17, 2010 0600 12/15/10 0642  WBC -- 13.8* 11.7*  HGB -- 9.5* 10.2*  HCT -- 30.8* 32.2*  PLT -- 272 295  NA -- -- 144  K -- -- 3.9  CL -- -- 109  CO2 -- -- 25  BUN -- -- 7  CREATININE -- -- 0.76  GLUCOSE -- -- 124*  INR 1.31 1.19 --  CALCIUM -- -- 8.7     Discharge Medications:  Current Discharge Medication List    START taking these medications   Details  methocarbamol (ROBAXIN) 500 MG tablet Take 1 tablet (500 mg total) by mouth every 6 (six) hours as needed (prn spasm). Qty: 40 tablet, Refills: 1    oxyCODONE-acetaminophen (ROXICET) 5-325 MG per tablet Take 2 tablets by mouth every 6 (six) hours as needed for pain. Qty: 50 tablet, Refills: 0      CONTINUE these medications which have CHANGED  Details  glimepiride (AMARYL) 2 MG tablet Take 1 tablet (2 mg total) by mouth 2 (two) times daily. Qty: 30 tablet, Refills: 0    metFORMIN (GLUCOPHAGE) 1000 MG tablet Take 1 tablet (1,000 mg total) by mouth 2 (two) times daily with a meal. Qty: 30 tablet, Refills: 0      CONTINUE these medications which have NOT CHANGED   Details  amitriptyline (ELAVIL) 50 MG tablet Take 50 mg by mouth at bedtime.      atorvastatin (LIPITOR) 10 MG tablet Take 10 mg by mouth daily.      carvedilol (COREG) 12.5 MG tablet Take 12.5 mg by mouth 2 (two) times daily with a meal.      cycloSPORINE (RESTASIS) 0.05 % ophthalmic emulsion Place 1 drop into both eyes 2 (two) times daily.      dexlansoprazole (DEXILANT) 60 MG capsule Take 60 mg by mouth daily.     diazepam (VALIUM) 5 MG tablet Take 5 mg by mouth every 6 (six) hours as needed. For muscle spasms      isosorbide mononitrate (IMDUR)  60 MG 24 hr tablet Take 60 mg by mouth 2 (two) times daily.      losartan (COZAAR) 100 MG tablet Take 100 mg by mouth daily.      Olopatadine HCl (PATADAY) 0.2 % SOLN Place 1 drop into both eyes daily.      saxagliptin HCl (ONGLYZA) 5 MG TABS tablet Take 5 mg by mouth daily.      topiramate (TOPAMAX) 100 MG tablet Take 100 mg by mouth daily. Takes 100mg  at night. & 25mg  in the morning     topiramate (TOPAMAX) 25 MG capsule Take 25 mg by mouth daily. Takes 25mg  in the morning & 100mg  in the evening     nitroGLYCERIN (NITROLINGUAL) 0.4 MG/SPRAY spray Place 1 spray under the tongue every 5 (five) minutes as needed. Chest pains        STOP taking these medications     clopidogrel (PLAVIX) 75 MG tablet      HYDROcodone-acetaminophen (NORCO) 10-325 MG per tablet      zolpidem (AMBIEN) 10 MG tablet         Diagnostic Studies: Ap Pelvis Hip  12/15/2010  *RADIOLOGY REPORT*  Clinical Data: Postoperative pelvis radiograph, status post left hip prosthesis revision.  PELVIS - 1-2 VIEW  Comparison: Left lumbar spine radiographs performed 09/15/2010, and abdominal radiograph performed 02/14/2007  Findings: The patient's left hip prosthesis appears grossly intact. Surrounding bony lucency appears relatively stable, without significant loosening.  The femoral stem is incompletely imaged, but appears grossly unremarkable.  Overlying soft tissue air is noted.  The right hip is unremarkable in appearance.  Mild sclerotic change is noted at the sacroiliac joints.  Lumbar spinal fusion rods are partially imaged.  A bowel suture line is noted at the lower pelvis.  The visualized bowel gas pattern is grossly unremarkable.  IMPRESSION: Left hip prosthesis appears grossly intact; stable appearance to bony lucency at the left ischium, without significant loosening. Overlying soft tissue air noted.  Original Report Authenticated By: Tonia Ghent, M.D.   Lateral Left Hip  12/15/2010  *RADIOLOGY REPORT*   Clinical Data: Status post left hip prosthesis revision.  LEFT HIP - 1 VIEW:  Comparison: Abdominal radiographs performed 02/14/2007.  Findings: The patient's left hip total arthroplasty appears grossly intact.  Surrounding lucency at the left ischium and along the proximal left femur appears stable from 2009.  There is no evidence of loosening.  The  femoral stem remains intact.  Overlying soft tissue air is noted.  A bowel suture line is noted at the lower pelvis.  No significant soft tissue abnormalities are otherwise characterized.  IMPRESSION: Left hip total arthroplasty appears grossly intact; no evidence of loosening.  Surrounding lucency at the left ischium and along the proximal left femur appears stable from 2009.  Overlying soft tissue air noted.  Original Report Authenticated By: Tonia Ghent, M.D.    Disposition: Skilled Nursing Facility  Discharge Orders    Future Orders Please Complete By Expires   Diet - low sodium heart healthy      Call MD / Call 911      Comments:   If you experience chest pain or shortness of breath, CALL 911 and be transported to the hospital emergency room.  If you develope a fever above 101 F, pus (white drainage) or increased drainage or redness at the wound, or calf pain, call your surgeon's office.   Constipation Prevention      Comments:   Drink plenty of fluids.  Prune juice may be helpful.  You may use a stool softener, such as Colace (over the counter) 100 mg twice a day.  Use MiraLax (over the counter) for constipation as needed.   Increase activity slowly as tolerated      Weight Bearing as taught in Physical Therapy      Comments:   Use a walker or crutches as instructed.   Discharge instructions      Comments:   folllow hip precautions. Office follow up Dr. Ophelia Charter 1 to 2 wks  Call [phone] for appt   Driving restrictions      Comments:   No driving for 6 weeks   Follow the hip precautions as taught in Physical Therapy      Change dressing        Comments:   You may change your dressing ondaily or as needed, then change the dressing daily with sterile 4 x 4 inch gauze dressing and paper tape.  You may clean the incision with alcohol prior to redressing   TED hose      Comments:   Use stockings (TED hose) for 4 weeks on both leg(s).  You may remove them at night for sleeping.         SignedEldred Manges 12/17/2010, 11:57 AM

## 2011-08-11 ENCOUNTER — Other Ambulatory Visit: Payer: Self-pay | Admitting: Neurosurgery

## 2011-08-11 DIAGNOSIS — M549 Dorsalgia, unspecified: Secondary | ICD-10-CM

## 2011-08-19 ENCOUNTER — Ambulatory Visit
Admission: RE | Admit: 2011-08-19 | Discharge: 2011-08-19 | Disposition: A | Discharge status: Home / Self Care | Payer: Medicare Other | Source: Ambulatory Visit | Attending: Neurosurgery | Admitting: Neurosurgery

## 2011-08-19 ENCOUNTER — Other Ambulatory Visit: Payer: Self-pay | Admitting: Neurosurgery

## 2011-08-19 DIAGNOSIS — M549 Dorsalgia, unspecified: Secondary | ICD-10-CM

## 2011-10-15 ENCOUNTER — Other Ambulatory Visit: Payer: Self-pay | Admitting: Cardiology

## 2011-10-15 DIAGNOSIS — Z1231 Encounter for screening mammogram for malignant neoplasm of breast: Secondary | ICD-10-CM

## 2011-11-08 ENCOUNTER — Ambulatory Visit: Payer: Medicare Other

## 2011-11-09 ENCOUNTER — Ambulatory Visit
Admission: RE | Admit: 2011-11-09 | Discharge: 2011-11-09 | Disposition: A | Discharge status: Home / Self Care | Payer: Medicare Other | Source: Ambulatory Visit | Attending: Cardiology | Admitting: Cardiology

## 2011-11-09 DIAGNOSIS — Z1231 Encounter for screening mammogram for malignant neoplasm of breast: Secondary | ICD-10-CM

## 2011-12-24 ENCOUNTER — Other Ambulatory Visit: Payer: Self-pay | Admitting: Neurosurgery

## 2011-12-24 DIAGNOSIS — M549 Dorsalgia, unspecified: Secondary | ICD-10-CM

## 2011-12-30 ENCOUNTER — Ambulatory Visit
Admission: RE | Admit: 2011-12-30 | Discharge: 2011-12-30 | Disposition: A | Discharge status: Home / Self Care | Payer: Medicare Other | Source: Ambulatory Visit | Attending: Neurosurgery | Admitting: Neurosurgery

## 2011-12-30 DIAGNOSIS — M549 Dorsalgia, unspecified: Secondary | ICD-10-CM

## 2012-04-15 ENCOUNTER — Encounter: Payer: Self-pay | Admitting: Neurology

## 2012-04-15 DIAGNOSIS — R079 Chest pain, unspecified: Secondary | ICD-10-CM

## 2012-04-15 DIAGNOSIS — G43809 Other migraine, not intractable, without status migrainosus: Secondary | ICD-10-CM

## 2012-04-15 DIAGNOSIS — E1149 Type 2 diabetes mellitus with other diabetic neurological complication: Secondary | ICD-10-CM | POA: Insufficient documentation

## 2012-04-15 DIAGNOSIS — G4733 Obstructive sleep apnea (adult) (pediatric): Secondary | ICD-10-CM

## 2012-05-04 IMAGING — CR DG PELVIS 1-2V
1 series · 1 of 1 positions shown · non-contrast
Comparison: Left lumbar spine radiographs performed 09/15/2010, and
abdominal radiograph performed 02/14/2007

CLINICAL DATA: Postoperative pelvis radiograph, status post left
hip prosthesis revision.

PELVIS - 1-2 VIEW

[t pelvis ap]
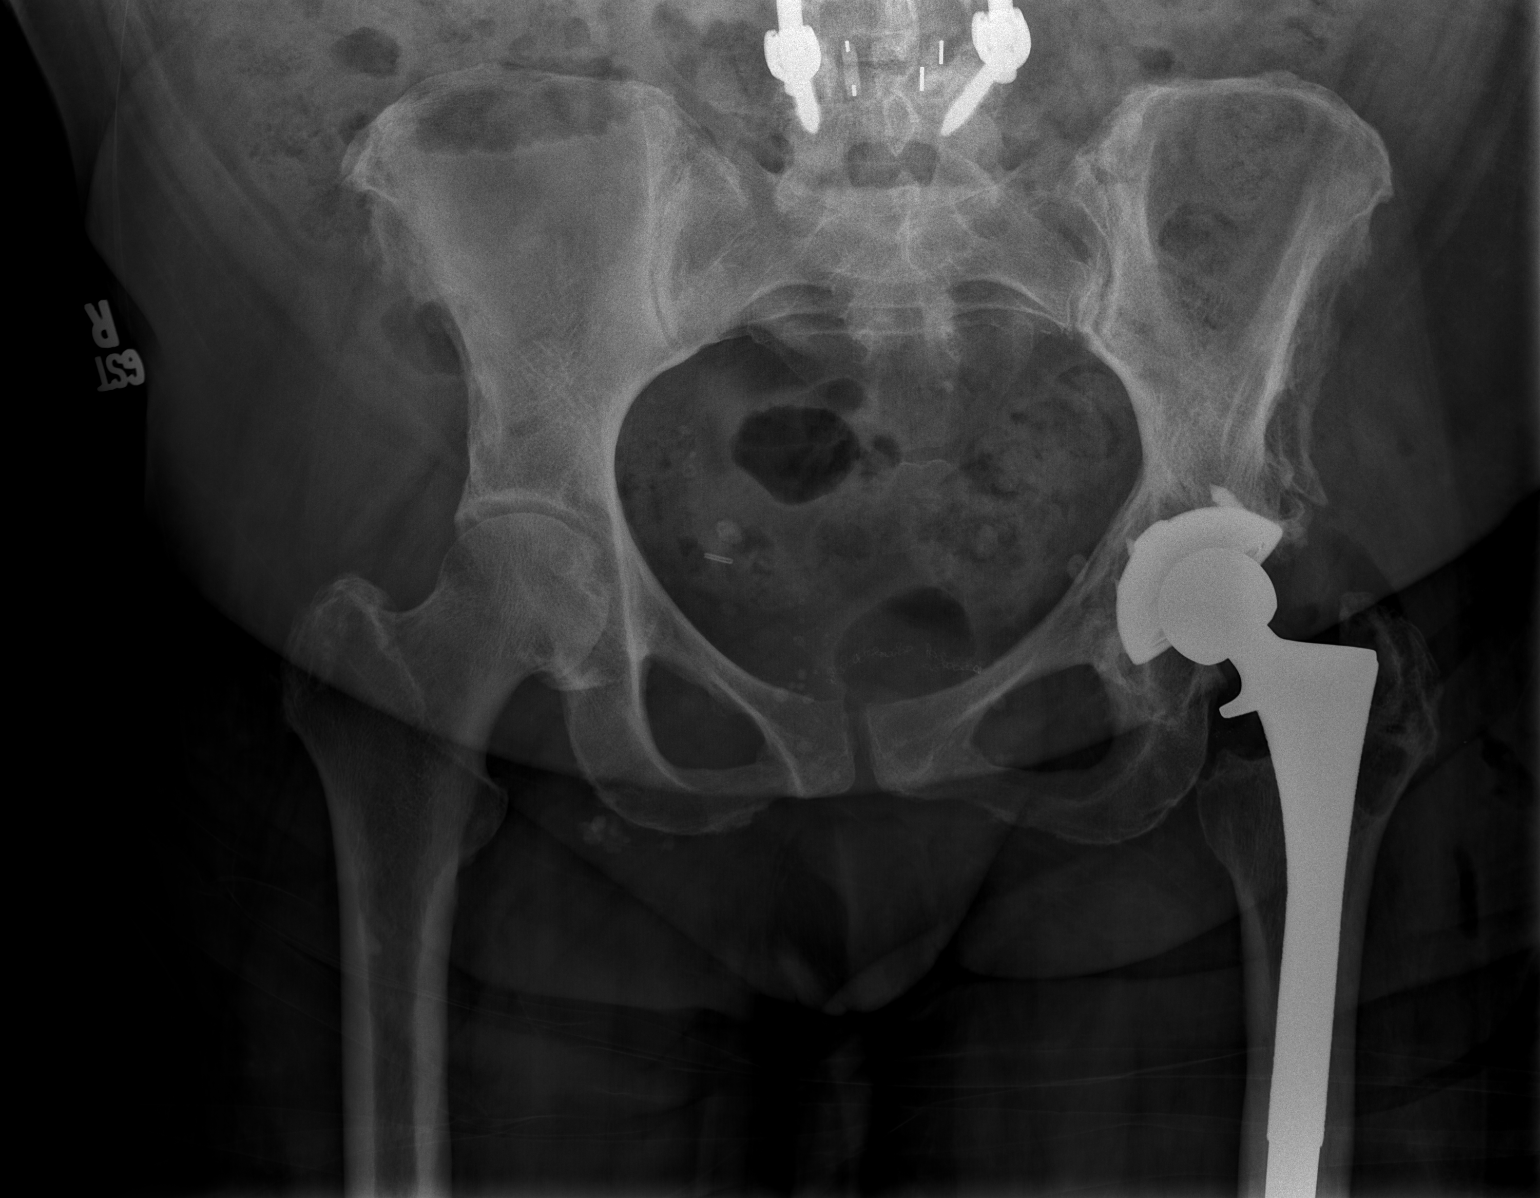

[1 of 1 positions shown; findings below may reference images not displayed]

FINDINGS: The patient's left hip prosthesis appears grossly intact.
Surrounding bony lucency appears relatively stable, without
significant loosening.  The femoral stem is incompletely imaged,
but appears grossly unremarkable.  Overlying soft tissue air is
noted.

The right hip is unremarkable in appearance.  Mild sclerotic change
is noted at the sacroiliac joints.  Lumbar spinal fusion rods are
partially imaged.  A bowel suture line is noted at the lower
pelvis.  The visualized bowel gas pattern is grossly unremarkable.
IMPRESSION: Left hip prosthesis appears grossly intact; stable appearance to
bony lucency at the left ischium, without significant loosening.
Overlying soft tissue air noted.

## 2012-05-08 ENCOUNTER — Other Ambulatory Visit: Payer: Self-pay

## 2012-05-08 MED ORDER — TOPIRAMATE 25 MG PO TABS: 25.0000 mg | ORAL_TABLET | ORAL | Status: DC

## 2012-05-08 MED ORDER — TOPIRAMATE 100 MG PO TABS: 100.0000 mg | ORAL_TABLET | Freq: Every evening | ORAL | Status: DC

## 2012-05-08 NOTE — Telephone Encounter (Signed)
Former [facility] patient assigned to Dr Yan  

## 2012-07-27 ENCOUNTER — Other Ambulatory Visit: Payer: Self-pay | Admitting: Neurology

## 2012-08-07 ENCOUNTER — Encounter: Payer: Self-pay | Admitting: Neurology

## 2012-08-07 ENCOUNTER — Ambulatory Visit (INDEPENDENT_AMBULATORY_CARE_PROVIDER_SITE_OTHER): Payer: Medicare Other | Admitting: Neurology

## 2012-08-07 VITALS — BP 137/75 | HR 89 | Temp 98.2°F | Ht 65.0 in | Wt 223.0 lb

## 2012-08-07 DIAGNOSIS — Z87898 Personal history of other specified conditions: Secondary | ICD-10-CM

## 2012-08-07 DIAGNOSIS — G43909 Migraine, unspecified, not intractable, without status migrainosus: Secondary | ICD-10-CM

## 2012-08-07 MED ORDER — TOPIRAMATE 100 MG PO TABS: 100.0000 mg | ORAL_TABLET | Freq: Every evening | ORAL | Status: DC

## 2012-08-07 MED ORDER — TOPIRAMATE 25 MG PO TABS: 25.0000 mg | ORAL_TABLET | ORAL | Status: DC

## 2012-08-07 NOTE — Progress Notes (Signed)
Subjective:    Patient ID: [name] is a 67 y.o. female.  HPI  Interim history:   [name] is a 67 year old right-handed woman who presents for followup consultation of her fainting episodes. She used to follow with Dr. Fayrene Fearing love and started having suspected fainting episodes in August 2000 age. She has an underlying medical history of obesity, asthma, hypertension, angina, diabetes, breast cancer, heart disease, hyperlipidemia, arthritis and obstructive sleep apnea. She was last seen by Dr. love in May 2013, at which time he felt that she was having dizzy episodes that were not consistent with seizures. He suggested a one-year followup. She is on Valium, Plavix, losartan, Restasis, hydrocodone as needed, Plavix, Lipitor, metformin, choric, amitriptyline, Topamax, Ambien.Workup in the past has included MRI of brain in September 2008 which was normal, MR a head in September 2008 which was normal, EEG in September 2008 which showed left frontotemporal sharp waves. She was started on Topamax in March 2009 and was admitted in January 2009 for syncope and atypical chest pain. He felt she had a Hx of Sz.   She is unaccompanied today and reports that she had 2 passing out spells in the past year, one episode was 3 months ago at church, when she went to the bathroom and then she had another episode at home. She lives alone. She has sleep apnea, but does not use a CPAP machine, she uses oxygen at night. She has an aid 4 days a week for 2:15 hours each time. She has several church friends that have keys to her apartment. Her son lives in [place] and her daughter lives in [place]. She denies any recent seizure like episodes. She has LOC usually for 2-5 minutes. No Hx of tongue bite or bladder incontinence. She had back surgery last year. She has a longstanding Hx of migraines and has had more HAs.   Her Past Medical History Is Significant For: Past Medical History  Diagnosis Date  . Myocardial infarction 98   . Dysrhythmia     arrythmia and spasms occ none for 8-9 months drf frasier  seeing me while dr sprull out with back  . Angina     none recently  . Asthma   . Blood transfusion   . GERD (gastroesophageal reflux disease)   . Hiatal hernia   . Diverticulitis   . Diabetes mellitus     type 2  . Obstructive sleep apnea   . Other forms of migraine, without mention of intractable migraine without mention of status migrainosus   . Chest pain    Her Past Surgical History Is Significant For: Past Surgical History  Procedure Laterality Date  . Abdominal hysterectomy    . Joint replacement      lsft hip x2, rt knee  . Knee arthroscopy      x2  . Foot surgery      bil feet great toes and second toes spurs  . Breast surgery      left bx  . Tonsillectomy    . Back surgery      x2 one for infection  . Cholecystectomy    . Total hip revision  12/14/2010    Procedure: TOTAL HIP REVISION;  Surgeon: Eldred Manges;  Location: [facility] OR;  Service: Orthopedics;  Laterality: Left;  Left Total Hip Arthroplasty Revision, Poly and Ball Exchange, Possible Acetabular Revision vs. Poly Exchange   Her Family History Is Significant For: No family history on file.  Her Social  History Is Significant For: History   Social History  . Marital Status: Single    Spouse Name: N/A    Number of Children: N/A  . Years of Education: N/A   Social History Main Topics  . Smoking status: Former Games developer  . Smokeless tobacco: Never Used  . Alcohol Use: No  . Drug Use: No  . Sexually Active:    Other Topics Concern  . None   Social History Narrative  . None    Her Allergies Are:  Allergies  Allergen Reactions  . Contrast Media (Iodinated Diagnostic Agents) Anaphylaxis  . Aspirin Nausea And Vomiting  . Gadolinium Other (See Comments)     Desc: throat swells   . Iohexol Other (See Comments)    unknown  :  Her Current Medications Are:  Outpatient Encounter Prescriptions as of 08/07/2012  Medication Sig  Dispense Refill  . alendronate (FOSAMAX) 70 MG tablet       . ALREX 0.2 % SUSP       . amitriptyline (ELAVIL) 50 MG tablet Take 50 mg by mouth at bedtime.        Marland Kitchen atorvastatin (LIPITOR) 10 MG tablet Take 10 mg by mouth daily.        . carvedilol (COREG) 12.5 MG tablet Take 12.5 mg by mouth 2 (two) times daily with a meal.        . clopidogrel (PLAVIX) 75 MG tablet       . cycloSPORINE (RESTASIS) 0.05 % ophthalmic emulsion Place 1 drop into both eyes 2 (two) times daily.        Marland Kitchen dexlansoprazole (DEXILANT) 60 MG capsule Take 60 mg by mouth daily.       . diazepam (VALIUM) 5 MG tablet Take 5 mg by mouth every 6 (six) hours as needed. For muscle spasms        . DULoxetine (CYMBALTA) 30 MG capsule       . glimepiride (AMARYL) 2 MG tablet Take 1 tablet (2 mg total) by mouth 2 (two) times daily.  30 tablet  0  . HYDROcodone-acetaminophen (NORCO) 10-325 MG per tablet       . isosorbide mononitrate (IMDUR) 60 MG 24 hr tablet Take 60 mg by mouth 2 (two) times daily.        Marland Kitchen losartan (COZAAR) 100 MG tablet Take 100 mg by mouth daily.        . metFORMIN (GLUCOPHAGE) 1000 MG tablet Take 1 tablet (1,000 mg total) by mouth 2 (two) times daily with a meal.  30 tablet  0  . nitroGLYCERIN (NITROLINGUAL) 0.4 MG/SPRAY spray Place 1 spray under the tongue every 5 (five) minutes as needed. Chest pains        . Olopatadine HCl (PATADAY) 0.2 % SOLN Place 1 drop into both eyes daily.        . pantoprazole (PROTONIX) 40 MG tablet       . PROAIR HFA 108 (90 BASE) MCG/ACT inhaler       . saxagliptin HCl (ONGLYZA) 5 MG TABS tablet Take 5 mg by mouth daily.        Marland Kitchen topiramate (TOPAMAX) 100 MG tablet Take 1 tablet (100 mg total) by mouth every evening.  90 tablet  0  . topiramate (TOPAMAX) 25 MG tablet Take 1 tablet (25 mg total) by mouth every morning.  30 tablet  0  . traMADol (ULTRAM) 50 MG tablet       . zolpidem (AMBIEN) 10 MG tablet  No facility-administered encounter medications on file as of  08/07/2012.  :  Review of Systems  Constitutional: Positive for chills and fatigue.  HENT: Positive for hearing loss.   Respiratory: Positive for shortness of breath.   Cardiovascular: Positive for chest pain, palpitations and leg swelling.  Gastrointestinal: Positive for constipation and blood in stool.       Diarrhea  Endocrine: Positive for cold intolerance, heat intolerance and polydipsia.  Genitourinary: Positive for difficulty urinating.  Musculoskeletal: Positive for arthralgias.  Allergic/Immunologic: Positive for environmental allergies.  Neurological: Positive for syncope and headaches.  Psychiatric/Behavioral: Positive for sleep disturbance (insomnia) and dysphoric mood.    Objective:  Neurologic Exam  Physical Exam Physical Examination:   Filed Vitals:   08/07/12 1531  BP: 137/75  Pulse: 89  Temp: 98.2 F (36.8 C)   General Examination: The patient is a very pleasant 67 y.o. female in no acute distress. She appears well-developed and well-nourished and well groomed. She is obese.  HEENT: Normocephalic, atraumatic, pupils are equal, round and reactive to light and accommodation. Funduscopic exam is normal with sharp disc margins noted. Extraocular tracking is good without limitation to gaze excursion or nystagmus noted. Normal smooth pursuit is noted. Hearing is grossly intact. Tympanic membranes are clear bilaterally. Face is symmetric with normal facial animation and normal facial sensation. Speech is clear with no dysarthria noted. There is no hypophonia. There is no lip, neck/head, jaw or voice tremor. Neck is supple with full range of passive and active motion. There are no carotid bruits on auscultation. Oropharynx exam reveals: mild mouth dryness, adequate dental hygiene and mild airway crowding. Mallampati is class II. Tongue protrudes centrally and palate elevates symmetrically.   Chest: Clear to auscultation without wheezing, rhonchi or crackles noted.  Heart:  S1+S2+0, regular and normal without murmurs, rubs or gallops noted.   Abdomen: Soft, non-tender and non-distended with normal bowel sounds appreciated on auscultation.  Extremities: There is no pitting edema in the distal lower extremities bilaterally. Pedal pulses are intact.  Skin: Warm and dry without trophic changes noted. There are no varicose veins.  Musculoskeletal: exam reveals no obvious joint deformities, tenderness or joint swelling or erythema.   Neurologically:  Mental status: The patient is awake, alert and oriented in all 4 spheres. Her memory, attention, language and knowledge are appropriate. There is no aphasia, agnosia, apraxia or anomia. Speech is clear with normal prosody and enunciation. Thought process is linear. Mood is congruent and affect is normal.  Cranial nerves are as described above under HEENT exam. In addition, shoulder shrug is normal with equal shoulder height noted. Motor exam: Normal bulk, strength and tone is noted. There is no drift, tremor or rebound. Romberg is negative. Reflexes are trace throughout. Fine motor skills are intact with normal finger taps, normal hand movements, normal rapid alternating patting, normal foot taps and normal foot agility.  Cerebellar testing shows no dysmetria or intention tremor on finger to nose testing. Heel to shin is unremarkable bilaterally. There is no truncal or gait ataxia.  Sensory exam is intact to light touch, pinprick, vibration, temperature sense and proprioception in the upper and lower extremities.  Gait, station and balance: she stands slowly and has c/o back pain. She walks cautiously. No veering to one side is noted. No leaning to one side is noted. Posture is age-appropriate and stance is wide based. No problems turning are noted. She turns in 3 steps.   Assessment and Plan:   Assessment and Plan:  In  summary, [name] is a very pleasant 67 y.o.-year old female with a history of syncope and possible  Hx of Sz. She also had migraines and is on topiramate 25 - 100 mg, which I renewed. She is advised to use a cane for safety. Her physical exam is stable and fairly non-focal. I reassured the patient in that regard.  I had a long chat with the patient about my findings and advised her to stay well hydrated. We talked about maintaining a healthy lifestyle in general. I encouraged the patient to eat healthy, exercise daily and keep well hydrated, to keep a scheduled bedtime and wake time routine, to not skip any meals and eat healthy snacks in between meals and to have protein with every meal.   As far as further diagnostic testing is concerned, I suggested the following today: no new test.  As far as medications are concerned, I recommended the following at this time: no change.  I suggested a follow up in 6 month with one of my associates and I will have our nurse, Hermenia Fiscal, to call her for her appointment.  I answered all her questions today and the patient was in agreement with the above outlined plan.

## 2012-08-07 NOTE — Patient Instructions (Addendum)
I think overall you are doing fairly well and are stable at this point.   I do have some generic suggestions for you today:  Please make sure that you drink plenty of fluids. I would like for you to exercise daily for example in the form of walking 20-30 minutes every day, if you can. Please keep a regular sleep-wake schedule, keep regular meal times, do not skip any meals, eat  healthy snacks in between meals, such as fruit or nuts. Try to eat protein with every meal.   As far as your medications are concerned, I would like to suggest: no changes.   As far as diagnostic testing, I recommend: no new test.   I do not think we need to make any changes in your medications at this point. I think you're stable enough that we can see you back in 6 months, sooner if we need to. Please call us if you have any interim questions, concerns, or problems or updates to need to discuss. I will have our nurse, Hermenia Fiscal, call you for your appointment with one of my associates.  Our phone number is [phone]. We also have an after hours call service for urgent matters and there is a physician on-call for urgent questions. For any emergencies you know to call 911 or go to the nearest emergency room.

## 2012-10-03 ENCOUNTER — Encounter: Payer: Self-pay | Admitting: Gastroenterology

## 2012-10-09 ENCOUNTER — Other Ambulatory Visit: Payer: Self-pay

## 2012-10-09 DIAGNOSIS — Z1231 Encounter for screening mammogram for malignant neoplasm of breast: Secondary | ICD-10-CM

## 2012-10-18 ENCOUNTER — Encounter: Payer: Self-pay | Admitting: Internal Medicine

## 2012-10-19 ENCOUNTER — Ambulatory Visit (INDEPENDENT_AMBULATORY_CARE_PROVIDER_SITE_OTHER): Payer: PRIVATE HEALTH INSURANCE | Admitting: Internal Medicine

## 2012-10-19 ENCOUNTER — Encounter: Payer: Self-pay | Admitting: Internal Medicine

## 2012-10-19 VITALS — BP 130/68 | HR 90 | Ht 65.0 in | Wt 224.0 lb

## 2012-10-19 DIAGNOSIS — R55 Syncope and collapse: Secondary | ICD-10-CM

## 2012-10-19 DIAGNOSIS — I208 Other forms of angina pectoris: Secondary | ICD-10-CM

## 2012-10-19 DIAGNOSIS — R079 Chest pain, unspecified: Secondary | ICD-10-CM

## 2012-10-19 DIAGNOSIS — R002 Palpitations: Secondary | ICD-10-CM

## 2012-10-19 NOTE — Assessment & Plan Note (Signed)
I suspect the patient has autonomic dysfunction. Because she also has palpitations, we will ask her to wear a 48 hour cardiac monitor. I would not anticipate additional tilt table testing or reinsertion of a loop recorder at this time. I've recommended the patient increase her salt and fluid intake for now,  And asked the patient to lie down if she feels an episode coming on. The physiology of autonomic dysfunction was discussed in detail. I would consider adding additional medications to support her blood pressure but will hold off on this for now.

## 2012-10-19 NOTE — Assessment & Plan Note (Signed)
I recommended that the patient undergo elective scan Myoview stress testing. While her electrocardiogram is only slightly abnormal, she is unable to walk due to arthritis.

## 2012-10-19 NOTE — Patient Instructions (Signed)
Your physician recommends that you schedule a follow-up appointment in: 4 weeks with Dr Ladona Ridgel  Your physician has recommended that you wear a holter monitor. Holter monitors are medical devices that record the heart's electrical activity. Doctors most often use these monitors to diagnose arrhythmias. Arrhythmias are problems with the speed or rhythm of the heartbeat. The monitor is a small, portable device. You can wear one while you do your normal daily activities. This is usually used to diagnose what is causing palpitations/syncope (passing out).   Your physician has requested that you have a lexiscan myoview. For further information please visit [email or web address] Please follow instruction sheet, as given.

## 2012-10-19 NOTE — Progress Notes (Signed)
HPI [name] returns today after a 4 year absence from our arrhythmia clinic. She is a very pleasant 67 year old woman with a history of hypertension and obesity, along with unexplained syncope likely due to autonomic dysfunction. The patient had an implantable loop recorder placed approximately 6 years ago. It has not been evaluated in over 2 years, and has reached end-of-life. The patient has had recurrent syncope which occurred 3 times in 2013, and 2 times in 2014. Most recently she was in church, speaking before the congregation, when she became dizzy, diaphoretic, and was helped to the ground before she passed out completely. She could not stand for several minutes, and her symptoms were associated with diaphoresis and severe fatigue. She had not had much to eat prior to the episode. The patient also experiences palpitations and has had documented heart rates at rest of over 120 beats per minute. In addition, she notes palpitations and a sensation that her heart is beating irregularly. Finally she describes chest tightness which can come and go and at times be associated with exertion and at other times not so much. She has associated shortness of breath. Most recent stress testing was carried out approximately 2-1/2 years ago demonstrating no evidence of ischemia and preserved left ventricular function. Allergies  Allergen Reactions  . Contrast Media [Iodinated Diagnostic Agents] Anaphylaxis  . Aspirin Nausea And Vomiting  . Gadolinium Other (See Comments)     Desc: throat swells   . Iohexol Other (See Comments)    unknown     Current Outpatient Prescriptions  Medication Sig Dispense Refill  . alendronate (FOSAMAX) 70 MG tablet Take 70 mg by mouth every 7 (seven) days.       Marland Kitchen atorvastatin (LIPITOR) 10 MG tablet Take 10 mg by mouth daily.        . carvedilol (COREG) 12.5 MG tablet Take 12.5 mg by mouth 2 (two) times daily with a meal.        . clopidogrel (PLAVIX) 75 MG tablet  Take 75 mg by mouth daily.       . cycloSPORINE (RESTASIS) 0.05 % ophthalmic emulsion Place 1 drop into both eyes 2 (two) times daily.        . diazepam (VALIUM) 5 MG tablet Take 5 mg by mouth every 6 (six) hours as needed. For muscle spasms        . DULoxetine (CYMBALTA) 30 MG capsule Take 30 mg by mouth daily.       Marland Kitchen glimepiride (AMARYL) 2 MG tablet Take 1 tablet (2 mg total) by mouth 2 (two) times daily.  30 tablet  0  . HYDROcodone-acetaminophen (NORCO) 10-325 MG per tablet Take by mouth as needed.       . isosorbide mononitrate (IMDUR) 60 MG 24 hr tablet Take 60 mg by mouth 2 (two) times daily.        Marland Kitchen losartan (COZAAR) 100 MG tablet Take 100 mg by mouth daily.        . metFORMIN (GLUCOPHAGE) 1000 MG tablet Take 1 tablet (1,000 mg total) by mouth 2 (two) times daily with a meal.  30 tablet  0  . nitroGLYCERIN (NITROLINGUAL) 0.4 MG/SPRAY spray Place 1 spray under the tongue every 5 (five) minutes as needed. Chest pains        . Olopatadine HCl (PATADAY) 0.2 % SOLN Place 1 drop into both eyes daily.        . pantoprazole (PROTONIX) 40 MG tablet Take 40 mg by  mouth daily.       Marland Kitchen PROAIR HFA 108 (90 BASE) MCG/ACT inhaler       . saxagliptin HCl (ONGLYZA) 5 MG TABS tablet Take 5 mg by mouth daily.        Marland Kitchen topiramate (TOPAMAX) 100 MG tablet Take 1 tablet (100 mg total) by mouth every evening.  30 tablet  5  . topiramate (TOPAMAX) 25 MG tablet Take 1 tablet (25 mg total) by mouth every morning.  30 tablet  5  . traMADol (ULTRAM) 50 MG tablet Take 50 mg by mouth every 6 (six) hours as needed.       . zolpidem (AMBIEN) 10 MG tablet Take 10 mg by mouth at bedtime as needed.        No current facility-administered medications for this visit.     Past Medical History  Diagnosis Date  . Myocardial infarction 1992 & 1998    NM Myocar Multi W/Spect W/Wall Motion EF on 08/07/10 - IMPRESSION: 1) No evidence of ischemia.  2) Stable focal infarct involving the apicoseptal wall when compared to prior  exams.  3) Normal LV wall motion.  4) Estimated Q G S EF 70%, unchanged.  Marland Kitchen Dysrhythmia     arrythmia and spasms occ none for 8-9 months drf frasier  seeing me while dr sprull out with back  . Angina     none recently  . Asthma   . Blood transfusion   . GERD (gastroesophageal reflux disease)   . Hiatal hernia   . Obstructive sleep apnea   . Other forms of migraine, without mention of intractable migraine without mention of status migrainosus   . Chest pain   . Dry eye     Chronic  . Hypercholesteremia   . Lumbago   . Lumbar radiculopathy   . Spondylolisthesis   . Diverticulosis of colon (without mention of hemorrhage)     Colonoscopy 11/09/09 - FINDINGS: 1) 10mm polp in transverse colon, resected & retrieved.  2) Diverticulosis in sigmoid & descending colon.  3) Medium-sized, internal hemorrhoids.  . Internal hemorrhoids without mention of complication     Colonoscopy 11/09/09 - FINDINGS: 1) 10mm polp in transverse colon, resected & retrieved.  2) Diverticulosis in sigmoid & descending colon.  3) Medium-sized, internal hemorrhoids.  . Benign neoplasm of colon 11/09/09    Colonoscopy 11/09/09 - FINDINGS: 1) 10mm polp in transverse colon, resected & retrieved.  2) Diverticulosis in sigmoid & descending colon.  3) Medium-sized, internal hemorrhoids.  . Hemorrhage of rectum and anus     Colonoscopy 11/09/09 - FINDINGS: 1) 10mm polp in transverse colon, resected & retrieved.  2) Diverticulosis in sigmoid & descending colon.  3) Medium-sized, internal hemorrhoids.  . Status post placement of implantable loop recorder 11/15/06    Medtronic -Model # 9528 -Seriel # [id number] H  . Essential hypertension, benign   . Esophagitis   . SOB (shortness of breath)     At rest & on exertion  . [facility] arthritis     Left middle finger  . Depression     40+ years  . Diabetes mellitus 10/2008    type 2  . Cancer 1989    Left breast cancer  . Obese   . Syncope     ILR impanted 11/15/06 Medtronic -Model  # 9528 -Seriel # [id number]  . Facet arthropathy, lumbar     L3-4  . Preoperative clearance     NM Myocar Multi W/Spect W/Wall Motion EF 08/07/10  IMPRESSION:  1) No evidence of ischemia.  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  3) Normal LV wall motion.  4) Estimated Q G S EF 70%, unchanged.    ROS:   All systems reviewed and negative except as noted in the HPI.   Past Surgical History  Procedure Laterality Date  . Abdominal hysterectomy  1978  . Joint replacement      lsft hip x2, rt knee  . Knee arthroscopy Right 2002    By Dr. Ophelia Charter  . Foot surgery Bilateral     Bil feet great toes and second toes spurs. By Dr. Al Corpus in 2004.  . Breast biopsy Left 1995    [facility]opsy at [facility]  . Tonsillectomy    . Back surgery      x2 one for infection  . Cholecystectomy    . Total hip revision  12/14/2010    Procedure: TOTAL HIP REVISION;  Surgeon: Eldred Manges;  Location: [facility] OR;  Service: Orthopedics;  Laterality: Left;  Left Total Hip Arthroplasty Revision, Poly and Ball Exchange, Possible Acetabular Revision vs. Poly Exchange  . Colonoscopy w/ polypectomy  11/09/09    FINDINGS: 1) 10mm polp in transverse colon, resected & retrieved.  2) Diverticulosis in sigmoid & descending colon.  3) Medium-sized, internal hemorrhoids.  . Loop recorder implant  11/15/06    For recurring syncope. Medtronic -Model # 9528 -Seriel # [id number]  . Hemorrhoid surgery  2011    Internal & external hemorrhoidectomy (x) 3 quadrants  . Esophagogastroduodenoscopy  02/15/07  . Dilation and curettage of uterus       Family History  Problem Relation Age of Onset  . Cancer Mother     Pancreatic  . Heart attack Father   . Diabetes    . Arthritis      Severe/Crippling  . Tuberculosis    . Hypertension    . Heart disease Sister   . Diabetes Brother   . Seizures Brother   . Autoimmune disease Brother     AIDS     History   Social  History  . Marital Status: Single    Spouse Name: N/A    Number of Children: N/A  . Years of Education: N/A   Occupational History  . Retired    Social History Main Topics  . Smoking status: Former Smoker    Quit date: 01/19/1983  . Smokeless tobacco: Never Used  . Alcohol Use: No  . Drug Use: No  . Sexual Activity: Not on file   Other Topics Concern  . Not on file   Social History Narrative  . No narrative on file     BP 130/68  Pulse 90  Ht 5\' 5"  (1.651 m)  Wt 224 lb (101.606 kg)  BMI 37.28 kg/m2  SpO2 98%  Physical Exam:  Obese but Well appearing 67 year old woman,NAD HEENT: Unremarkable Neck:  7 cm JVD, no thyromegally Back:  No CVA tenderness Lungs:  Clear with no wheezes, rales, or rhonchi. Implantable loop recorder can be palpated on the chest wall. HEART:  Regular rate rhythm, no murmurs, no rubs, no clicks Abd:  soft, obese, positive bowel sounds, no organomegally, no rebound, no guarding Ext:  2 plus pulses, no edema, no cyanosis, no clubbing Skin:  No rashes no nodules Neuro:  CN II through XII intact, motor grossly intact  EKG - normal sinus rhythm with nonspecific ST-T wave abnormality  DEVICE  Normal device function.  See PaceArt for details. Implantable loop recorder cannot be communicated with.  Assess/Plan:

## 2012-11-07 ENCOUNTER — Ambulatory Visit (INDEPENDENT_AMBULATORY_CARE_PROVIDER_SITE_OTHER): Payer: PRIVATE HEALTH INSURANCE | Admitting: Gastroenterology

## 2012-11-07 ENCOUNTER — Encounter (HOSPITAL_COMMUNITY): Payer: PRIVATE HEALTH INSURANCE

## 2012-11-07 ENCOUNTER — Encounter: Payer: Self-pay | Admitting: Gastroenterology

## 2012-11-07 VITALS — BP 104/64 | HR 84 | Ht 64.57 in | Wt 220.4 lb

## 2012-11-07 DIAGNOSIS — R1013 Epigastric pain: Secondary | ICD-10-CM

## 2012-11-07 DIAGNOSIS — Z800 Family history of malignant neoplasm of digestive organs: Secondary | ICD-10-CM | POA: Insufficient documentation

## 2012-11-07 DIAGNOSIS — Z860100 Personal history of colon polyps, unspecified: Secondary | ICD-10-CM | POA: Insufficient documentation

## 2012-11-07 DIAGNOSIS — K625 Hemorrhage of anus and rectum: Secondary | ICD-10-CM

## 2012-11-07 NOTE — Assessment & Plan Note (Signed)
Colonoscopy every 5 years 

## 2012-11-07 NOTE — Assessment & Plan Note (Addendum)
Etiology for abdominal pain is uncertain.  Active peptic disease is less likely in the face of PPI therapy.  Gastritis including H. pylori infection are considerations.  Recommendations #1 empiric change from protonix to dexilant 60 mg daily #2 upper endoscopy.  Patient will remain on Plavix for this exam

## 2012-11-07 NOTE — Assessment & Plan Note (Signed)
Plan followup colonoscopy after resolution of cardiac issues.

## 2012-11-07 NOTE — Patient Instructions (Signed)
You have been scheduled for an endoscopy with propofol. Please follow written instructions given to you at your visit today. If you use inhalers (even only as needed), please bring them with you on the day of your procedure. Your physician has requested that you go to [email or web address] and enter the access code given to you at your visit today. This web site gives a general overview about your procedure. However, you should still follow specific instructions given to you by our office regarding your preparation for the procedure.   STAY ON PLAVIX PER DR KAPLAN  Discontinue protonix and start dexilant samples once daily

## 2012-11-07 NOTE — Progress Notes (Signed)
History of Present Illness:  67 year old Afro-American female referred for evaluation of abdominal pain and rectal bleeding.  For the past 2 weeks she's been having fairly constant upper abdominal pain characterized by aching pain accompanied by nausea.  Symptoms are worsened postprandially.  She takes Protonix daily.  She is on no gastric irritants including nonsteroidals.  She denies dysphagia or pyrosis.  Nausea has been a persistent problem although she has not vomited.  She's had bouts of nausea in the past.  Patient also has seen small amount of blood on the toilet tissue.  At one point she claims that she had a solid black bowel movement.  She's undergone hemorrhoidectomy x2.  A 10 mm polyp was removed from the transverse colon in 2011.  3 year followup was recommended.  Family history is pertinent for brother with colon cancer.  The patient has been complaining of palpitations.  She's had some chest pain that is nonexertional.  She is scheduled for stress test next week and visit with cardiology.    Past Medical History  Diagnosis Date  . Myocardial infarction 1992 & 1998    NM Myocar Multi W/Spect W/Wall Motion EF on 08/07/10 - IMPRESSION: 1) No evidence of ischemia.  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  3) Normal LV wall motion.  4) Estimated Q G S EF 70%, unchanged.  Marland Kitchen Dysrhythmia     arrythmia and spasms occ none for 8-9 months drf frasier  seeing me while dr sprull out with back  . Angina     none recently  . Asthma   . Blood transfusion   . GERD (gastroesophageal reflux disease)   . Hiatal hernia   . Obstructive sleep apnea   . Other forms of migraine, without mention of intractable migraine without mention of status migrainosus   . Chest pain   . Dry eye     Chronic  . Hypercholesteremia   . Lumbago   . Lumbar radiculopathy   . Spondylolisthesis   . Diverticulosis of colon (without mention of hemorrhage)     Colonoscopy  11/09/09 - FINDINGS: 1) 10mm polp in transverse colon, resected & retrieved.  2) Diverticulosis in sigmoid & descending colon.  3) Medium-sized, internal hemorrhoids.  . Internal hemorrhoids without mention of complication     Colonoscopy 11/09/09 - FINDINGS: 1) 10mm polp in transverse colon, resected & retrieved.  2) Diverticulosis in sigmoid & descending colon.  3) Medium-sized, internal hemorrhoids.  . Benign neoplasm of colon 11/09/09    Colonoscopy 11/09/09 - FINDINGS: 1) 10mm polp in transverse colon, resected & retrieved.  2) Diverticulosis in sigmoid & descending colon.  3) Medium-sized, internal hemorrhoids.  . Hemorrhage of rectum and anus     Colonoscopy 11/09/09 - FINDINGS: 1) 10mm polp in transverse colon, resected & retrieved.  2) Diverticulosis in sigmoid & descending colon.  3) Medium-sized, internal hemorrhoids.  . Status post placement of implantable loop recorder 11/15/06    Medtronic -Model # [id number] -Seriel # [id number] H  . Essential hypertension, benign   . Esophagitis   . SOB (shortness of breath)     At rest & on exertion  . CMC arthritis     Left middle finger  . Depression     40+ years  . Diabetes mellitus 10/2008    type 2  . Breast cancer 1989    Left breast cancer  . Obese   . Syncope     ILR impanted 11/15/06  Medtronic -Model # [id number] -Seriel # [id number]  . Facet arthropathy, lumbar     L3-4  . Preoperative clearance     NM Myocar Multi W/Spect W/Wall Motion EF 08/07/10  IMPRESSION: 1) No evidence of ischemia.  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  3) Normal LV wall motion.  4) Estimated Q G S EF 70%, unchanged.   Past Surgical History  Procedure Laterality Date  . Abdominal hysterectomy  1978  . Joint replacement      lsft hip x2, rt knee  . Knee arthroscopy Right 2002    By Dr. Ophelia Charter  . Foot surgery Bilateral     Bil feet great toes and second toes spurs. By Dr. Al Corpus in 2004.  . Breast biopsy Left 1995     Biopsy at [facility] ([facility])  . Tonsillectomy    . Back surgery      x2 one for infection  . Cholecystectomy    . Total hip revision  12/14/2010    Procedure: TOTAL HIP REVISION;  Surgeon: Eldred Manges;  Location: [facility] OR;  Service: Orthopedics;  Laterality: Left;  Left Total Hip Arthroplasty Revision, Poly and Ball Exchange, Possible Acetabular Revision vs. Poly Exchange  . Colonoscopy w/ polypectomy  11/09/09    FINDINGS: 1) 10mm polp in transverse colon, resected & retrieved.  2) Diverticulosis in sigmoid & descending colon.  3) Medium-sized, internal hemorrhoids.  . Loop recorder implant  11/15/06    For recurring syncope. Medtronic -Model # [id number] -Seriel # [id number]  . Hemorrhoid surgery  2011    Internal & external hemorrhoidectomy (x) 3 quadrants  . Esophagogastroduodenoscopy  02/15/07  . Dilation and curettage of uterus     family history includes Arthritis in her brother; Autoimmune disease in her brother; Colon cancer in her brother; Diabetes in her brother and son; Heart attack in her father; Heart disease in her sister; Hypertension in her brother; Lung cancer in her brother; Pancreatic cancer in her mother; Seizures in her brother; Tuberculosis in her brother. Current Outpatient Prescriptions  Medication Sig Dispense Refill  . alendronate (FOSAMAX) 70 MG tablet Take 70 mg by mouth every 7 (seven) days.       Marland Kitchen atorvastatin (LIPITOR) 10 MG tablet Take 10 mg by mouth daily.        . carvedilol (COREG) 12.5 MG tablet Take 12.5 mg by mouth 2 (two) times daily with a meal.        . clopidogrel (PLAVIX) 75 MG tablet Take 75 mg by mouth daily.       . cycloSPORINE (RESTASIS) 0.05 % ophthalmic emulsion Place 1 drop into both eyes 2 (two) times daily.        . diazepam (VALIUM) 5 MG tablet Take 5 mg by mouth every 6 (six) hours as needed. For muscle spasms        . DULoxetine (CYMBALTA) 30 MG capsule Take 30 mg by mouth daily.       Marland Kitchen glimepiride (AMARYL) 2 MG tablet  Take 1 tablet (2 mg total) by mouth 2 (two) times daily.  30 tablet  0  . HYDROcodone-acetaminophen (NORCO) 10-325 MG per tablet Take by mouth as needed.       . isosorbide mononitrate (IMDUR) 60 MG 24 hr tablet Take 60 mg by mouth 2 (two) times daily.        Marland Kitchen losartan (COZAAR) 100 MG tablet Take 100 mg by mouth daily.        Marland Kitchen  metFORMIN (GLUCOPHAGE) 1000 MG tablet Take 1 tablet (1,000 mg total) by mouth 2 (two) times daily with a meal.  30 tablet  0  . nitroGLYCERIN (NITROLINGUAL) 0.4 MG/SPRAY spray Place 1 spray under the tongue every 5 (five) minutes as needed. Chest pains        . Olopatadine HCl (PATADAY) 0.2 % SOLN Place 1 drop into both eyes daily.        . pantoprazole (PROTONIX) 40 MG tablet Take 40 mg by mouth daily.       Marland Kitchen PROAIR HFA 108 (90 BASE) MCG/ACT inhaler       . saxagliptin HCl (ONGLYZA) 5 MG TABS tablet Take 5 mg by mouth daily.        Marland Kitchen topiramate (TOPAMAX) 100 MG tablet Take 1 tablet (100 mg total) by mouth every evening.  30 tablet  5  . topiramate (TOPAMAX) 25 MG tablet Take 1 tablet (25 mg total) by mouth every morning.  30 tablet  5  . traMADol (ULTRAM) 50 MG tablet Take 50 mg by mouth every 6 (six) hours as needed.       . zolpidem (AMBIEN) 10 MG tablet Take 10 mg by mouth at bedtime as needed.        No current facility-administered medications for this visit.   Allergies as of 11/07/2012 - Review Complete 11/07/2012  Allergen Reaction Noted  . Contrast media [iodinated diagnostic agents] Anaphylaxis 12/03/2010  . Aspirin Nausea And Vomiting   . Gadolinium Other (See Comments) 08/11/2007  . Iohexol Other (See Comments) 05/22/2003    reports that she quit smoking about 29 years ago. Her smoking use included Cigarettes. She smoked 0.00 packs per day. She has never used smokeless tobacco. She reports that she does not drink alcohol or use illicit drugs.     Review of Systems: Pertinent positive and negative review of systems were noted in the above HPI  section. All other review of systems were otherwise negative.  Vital signs were reviewed in today's medical record Physical Exam: General: Well developed , well nourished, no acute distress Skin: anicteric Head: Normocephalic and atraumatic Eyes:  sclerae anicteric, EOMI Ears: Normal auditory acuity Mouth: No deformity or lesions Neck: Supple, no masses or thyromegaly Lungs: Clear throughout to auscultation Heart: Regular rate and rhythm; no murmurs, rubs or bruits Abdomen: Soft,  and non distended. No masses, hepatosplenomegaly or hernias noted. Normal Bowel sounds.  There is mild tenderness to palpation in the right upper quadrant Rectal: There are no external lesions Musculoskeletal: Symmetrical with no gross deformities  Skin: No lesions on visible extremities Pulses:  Normal pulses noted Extremities: No clubbing, cyanosis, edema or deformities noted Neurological: Alert oriented x 4, grossly nonfocal Cervical Nodes:  No significant cervical adenopathy Inguinal Nodes: No significant inguinal adenopathy Psychological:  Alert and cooperative. Normal mood and affect

## 2012-11-07 NOTE — Assessment & Plan Note (Addendum)
The patient's limited rectal bleeding is probably hemorrhoidal.  History is noteworthy for passage of a solid black bowel movement raising the question of a more proximal GI bleeding source.  Recommendations #1 elective colonoscopy once cardiac issues have been evaluated.  If bleeding is deemed to be hemorrhoidal would consider band ligation.

## 2012-11-09 ENCOUNTER — Ambulatory Visit
Admission: RE | Admit: 2012-11-09 | Discharge: 2012-11-09 | Disposition: A | Discharge status: Home / Self Care | Payer: PRIVATE HEALTH INSURANCE | Source: Ambulatory Visit

## 2012-11-09 DIAGNOSIS — Z1231 Encounter for screening mammogram for malignant neoplasm of breast: Secondary | ICD-10-CM

## 2012-11-13 ENCOUNTER — Encounter (INDEPENDENT_AMBULATORY_CARE_PROVIDER_SITE_OTHER): Payer: PRIVATE HEALTH INSURANCE

## 2012-11-13 ENCOUNTER — Encounter: Payer: Self-pay | Admitting: Radiology

## 2012-11-13 DIAGNOSIS — R002 Palpitations: Secondary | ICD-10-CM

## 2012-11-13 NOTE — Progress Notes (Signed)
Patient ID: [name], female   DOB: 12-Aug-1945, 67 y.o.   MRN: [id number] E cardio 48hr holter applied

## 2012-11-14 ENCOUNTER — Encounter: Payer: PRIVATE HEALTH INSURANCE | Admitting: Internal Medicine

## 2012-11-14 ENCOUNTER — Ambulatory Visit: Payer: Medicare Other | Admitting: Podiatry

## 2012-11-15 ENCOUNTER — Ambulatory Visit (HOSPITAL_COMMUNITY): Payer: PRIVATE HEALTH INSURANCE | Attending: Cardiology | Admitting: Radiology

## 2012-11-15 VITALS — BP 124/72 | HR 79 | Ht 65.0 in | Wt 220.0 lb

## 2012-11-15 DIAGNOSIS — E785 Hyperlipidemia, unspecified: Secondary | ICD-10-CM | POA: Insufficient documentation

## 2012-11-15 DIAGNOSIS — R61 Generalized hyperhidrosis: Secondary | ICD-10-CM | POA: Insufficient documentation

## 2012-11-15 DIAGNOSIS — R0609 Other forms of dyspnea: Secondary | ICD-10-CM | POA: Insufficient documentation

## 2012-11-15 DIAGNOSIS — R110 Nausea: Secondary | ICD-10-CM | POA: Insufficient documentation

## 2012-11-15 DIAGNOSIS — I10 Essential (primary) hypertension: Secondary | ICD-10-CM | POA: Insufficient documentation

## 2012-11-15 DIAGNOSIS — Z87891 Personal history of nicotine dependence: Secondary | ICD-10-CM | POA: Insufficient documentation

## 2012-11-15 DIAGNOSIS — R55 Syncope and collapse: Secondary | ICD-10-CM | POA: Insufficient documentation

## 2012-11-15 DIAGNOSIS — Z8249 Family history of ischemic heart disease and other diseases of the circulatory system: Secondary | ICD-10-CM | POA: Insufficient documentation

## 2012-11-15 DIAGNOSIS — E119 Type 2 diabetes mellitus without complications: Secondary | ICD-10-CM | POA: Insufficient documentation

## 2012-11-15 DIAGNOSIS — I208 Other forms of angina pectoris: Secondary | ICD-10-CM

## 2012-11-15 DIAGNOSIS — R002 Palpitations: Secondary | ICD-10-CM | POA: Insufficient documentation

## 2012-11-15 DIAGNOSIS — R079 Chest pain, unspecified: Secondary | ICD-10-CM

## 2012-11-15 DIAGNOSIS — R0989 Other specified symptoms and signs involving the circulatory and respiratory systems: Secondary | ICD-10-CM | POA: Insufficient documentation

## 2012-11-15 DIAGNOSIS — R42 Dizziness and giddiness: Secondary | ICD-10-CM | POA: Insufficient documentation

## 2012-11-15 DIAGNOSIS — R000 Tachycardia, unspecified: Secondary | ICD-10-CM | POA: Insufficient documentation

## 2012-11-15 DIAGNOSIS — I252 Old myocardial infarction: Secondary | ICD-10-CM | POA: Insufficient documentation

## 2012-11-15 DIAGNOSIS — R0789 Other chest pain: Secondary | ICD-10-CM | POA: Insufficient documentation

## 2012-11-15 MED ORDER — TECHNETIUM TC 99M SESTAMIBI GENERIC - CARDIOLITE
10.0000 | Freq: Once | INTRAVENOUS | Status: AC | PRN
  Administered 2012-11-15: 10 via INTRAVENOUS

## 2012-11-15 MED ORDER — AMINOPHYLLINE 25 MG/ML IV SOLN
75.0000 mg | Freq: Once | INTRAVENOUS | Status: AC
  Administered 2012-11-15: 75 mg via INTRAVENOUS

## 2012-11-15 MED ORDER — REGADENOSON 0.4 MG/5ML IV SOLN
0.4000 mg | Freq: Once | INTRAVENOUS | Status: AC
  Administered 2012-11-15: 0.4 mg via INTRAVENOUS

## 2012-11-15 MED ORDER — TECHNETIUM TC 99M SESTAMIBI GENERIC - CARDIOLITE
30.0000 | Freq: Once | INTRAVENOUS | Status: AC | PRN
  Administered 2012-11-15: 30 via INTRAVENOUS

## 2012-11-15 NOTE — Progress Notes (Signed)
[facility] SITE 3 NUCLEAR MED [address] [place], [place] [address] [phone]    Cardiology Nuclear Med Study  [name] is a 67 y.o. female     MRN : [id number]     DOB: [birth year]  Procedure Date: 11/15/2012  Nuclear Med Background Indication for Stress Test:  Evaluation for Ischemia History:  MI's; '12 ION:GEXBMW septal wall ischemia, EF=70% Cardiac Risk Factors: Family History - CAD, History of Smoking, Hypertension, Lipids and NIDDM  Symptoms:  Chest Pain and Tightness>Left Arm with and without Exertion (last episode of chest discomfort was this a.m. at home, none now), Diaphoresis, Nausea, Dizziness, DOE, Palpitations, Rapid HR and Syncope   Nuclear Pre-Procedure Caffeine/Decaff Intake:  None NPO After: 7:30am   Lungs:  Clear. O2 Sat: 97% on room air. IV 0.9% NS with Angio Cath:  22g  IV Site: R Wrist  IV Started by:  Irean Hong, RN  Chest Size (in):  44 Cup Size: DD  Height: 5\' 5"  (1.651 m)  Weight:  220 lb (99.791 kg)  BMI:  Body mass index is 36.61 kg/(m^2). Tech Comments:  N/A    Nuclear Med Study 1 or 2 day study: 1 day  Stress Test Type:  Lexiscan  Reading MD: Cassell Clement, MD  Order Authorizing Provider:  Lewayne Bunting, MD  Resting Radionuclide: Technetium 53m Sestamibi  Resting Radionuclide Dose: 11.0 mCi   Stress Radionuclide:  Technetium 35m Sestamibi  Stress Radionuclide Dose: 33.0 mCi           Stress Protocol Rest HR: 79 Stress HR: 98  Rest BP: 124/72 Stress BP: 135/64  Exercise Time (min): n/a METS: n/a   Predicted Max HR: 155 bpm % Max HR: 63.23 bpm Rate Pressure Product: 41324   Dose of Adenosine (mg):  n/a Dose of Lexiscan: 0.4 mg  Dose of Atropine (mg): n/a Dose of Dobutamine: n/a mcg/kg/min (at max HR)  Stress Test Technologist: Smiley Houseman, CMA-N  Nuclear Technologist:  Domenic Polite, CNMT     Rest Procedure:  Myocardial perfusion imaging was performed at rest 45 minutes following the intravenous  administration of Technetium 52m Sestamibi.  Rest ECG: NSR with non-specific ST-T wave changes  Stress Procedure:  The patient received IV Lexiscan 0.4 mg over 15-seconds.  Technetium 49m Sestamibi injected at 30-seconds.  Occasional PVC's were noted.  She c/o headache and palpitations with Lexiscan.  Quantitative spect images were obtained after a 45 minute delay.  Stress ECG: No significant change from baseline ECG  QPS Raw Data Images:  Normal; no motion artifact; normal heart/lung ratio. Stress Images:  There is decreased uptake in the apex. Rest Images:  There is decreased uptake in the apex. Subtraction (SDS):  There is a fixed defect that is most consistent with a previous infarction. Transient Ischemic Dilatation (Normal <1.22):  0.92 Lung/Heart Ratio (Normal <0.45):  0.34  Quantitative Gated Spect Images QGS EDV:  74 ml QGS ESV:  26 ml  Impression Exercise Capacity:  Lexiscan with no exercise. BP Response:  Normal blood pressure response. Clinical Symptoms:  No chest pain. ECG Impression:  No significant ST segment change suggestive of ischemia. Comparison with Prior Nuclear Study: No significant change from previous study  Overall Impression:  Low risk stress nuclear study.  There is a small fixed defect at apex consistent with old scar. No change from prior study in 2012. There is no ischemia.  LV Ejection Fraction: 65%.  LV Wall Motion:  NL LV Function; NL Wall Motion  [name]

## 2012-11-16 ENCOUNTER — Telehealth: Payer: Self-pay | Admitting: Gastroenterology

## 2012-11-17 ENCOUNTER — Encounter: Payer: PRIVATE HEALTH INSURANCE | Admitting: Gastroenterology

## 2012-11-17 MED ORDER — DEXLANSOPRAZOLE 60 MG PO CPDR: 60.0000 mg | DELAYED_RELEASE_CAPSULE | Freq: Every day | ORAL | Status: DC

## 2012-11-17 NOTE — Telephone Encounter (Signed)
Informed pt that Dexilant was not sent to her pharmacy that Protonix must have been sent on a auto refill request Sent Dexilant to her pharmacy today

## 2012-11-21 ENCOUNTER — Ambulatory Visit (INDEPENDENT_AMBULATORY_CARE_PROVIDER_SITE_OTHER): Payer: PRIVATE HEALTH INSURANCE | Admitting: Internal Medicine

## 2012-11-21 ENCOUNTER — Encounter: Payer: Self-pay | Admitting: Internal Medicine

## 2012-11-21 ENCOUNTER — Encounter: Payer: PRIVATE HEALTH INSURANCE | Admitting: Gastroenterology

## 2012-11-21 VITALS — BP 108/68 | HR 93 | Ht 64.75 in | Wt 226.4 lb

## 2012-11-21 DIAGNOSIS — R55 Syncope and collapse: Secondary | ICD-10-CM

## 2012-11-21 DIAGNOSIS — I10 Essential (primary) hypertension: Secondary | ICD-10-CM

## 2012-11-21 DIAGNOSIS — R079 Chest pain, unspecified: Secondary | ICD-10-CM

## 2012-11-21 MED ORDER — DILTIAZEM HCL ER COATED BEADS 180 MG PO CP24: 180.0000 mg | ORAL_CAPSULE | Freq: Every day | ORAL | Status: DC

## 2012-11-21 NOTE — Assessment & Plan Note (Signed)
The possibility that she has coronary spasm. We'll stop her beta blocker, and start a calcium channel blocker in addition to her long-acting nitrates

## 2012-11-21 NOTE — Assessment & Plan Note (Signed)
No recurrent symptoms. I suspect autonomic dysfunction. We'll follow up.

## 2012-11-21 NOTE — Progress Notes (Signed)
HPI [name] is a 67 yo woman with a h/o autonomic dysfunction, HTN, probable coronary spasm, and dyslipidemia. I saw the patient several weeks ago when she had palpitations and chest pain. She was unable to walk and underwent stress perfusion imaging which was unremarkable. She also were cardiac monitor which demonstrated PACs and PVCs but no sustained arrhythmias. She continues to have episodes of nonexertional chest pain. 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 She had no other complaints today.  Allergies  Allergen Reactions  . Contrast Media [Iodinated Diagnostic Agents] Anaphylaxis  . Gadolinium Anaphylaxis       . Aspirin Nausea And Vomiting  . Iohexol Other (See Comments)    unknown     Current Outpatient Prescriptions  Medication Sig Dispense Refill  . alendronate (FOSAMAX) 70 MG tablet Take 70 mg by mouth every 7 (seven) days.       Marland Kitchen atorvastatin (LIPITOR) 10 MG tablet Take 10 mg by mouth daily.        . clopidogrel (PLAVIX) 75 MG tablet Take 75 mg by mouth daily.       . cycloSPORINE (RESTASIS) 0.05 % ophthalmic emulsion Place 1 drop into both eyes 2 (two) times daily.        Marland Kitchen dexlansoprazole (DEXILANT) 60 MG capsule Take 1 capsule (60 mg total) by mouth daily.  30 capsule  3  . diazepam (VALIUM) 5 MG tablet Take 5 mg by mouth every 6 (six) hours as needed. For muscle spasms        . docusate sodium (COLACE) 100 MG capsule Take 100 mg by mouth daily.      . DULoxetine (CYMBALTA) 30 MG capsule Take 30 mg by mouth daily.       Marland Kitchen glimepiride (AMARYL) 2 MG tablet Take 1 tablet (2 mg total) by mouth 2 (two) times daily.  30 tablet  0  . HYDROcodone-acetaminophen (NORCO) 10-325 MG per tablet Take by mouth as needed.       . isosorbide mononitrate (IMDUR) 60 MG 24 hr tablet Take 60 mg by mouth 2 (two) times daily.        Marland Kitchen losartan (COZAAR) 100 MG tablet Take 100 mg by mouth daily.        . metFORMIN (GLUCOPHAGE) 1000 MG tablet  Take 1 tablet (1,000 mg total) by mouth 2 (two) times daily with a meal.  30 tablet  0  . nitroGLYCERIN (NITROLINGUAL) 0.4 MG/SPRAY spray Place 1 spray under the tongue every 5 (five) minutes as needed. Chest pains        . Olopatadine HCl (PATADAY) 0.2 % SOLN Place 1 drop into both eyes daily.        Marland Kitchen PROAIR HFA 108 (90 BASE) MCG/ACT inhaler       . saxagliptin HCl (ONGLYZA) 5 MG TABS tablet Take 5 mg by mouth daily.        Marland Kitchen topiramate (TOPAMAX) 100 MG tablet Take 1 tablet (100 mg total) by mouth every evening.  30 tablet  5  . topiramate (TOPAMAX) 25 MG tablet Take 1 tablet (25 mg total) by mouth every morning.  30 tablet  5  . traMADol (ULTRAM) 50 MG tablet Take 50 mg by mouth every 6 (six) hours as needed.       . zolpidem (AMBIEN) 10 MG tablet Take 10 mg by mouth at bedtime as needed.       . diltiazem (CARDIZEM CD) 180  MG 24 hr capsule Take 1 capsule (180 mg total) by mouth daily.  90 capsule  3   No current facility-administered medications for this visit.     Past Medical History  Diagnosis Date  . Myocardial infarction 1992 & 1998    NM Myocar Multi W/Spect W/Wall Motion EF on 08/07/10 - IMPRESSION: 1) No evidence of ischemia.  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  3) Normal LV wall motion.  4) Estimated Q G S EF 70%, unchanged.  Marland Kitchen Dysrhythmia     arrythmia and spasms occ none for 8-9 months drf frasier  seeing me while dr sprull out with back  . Angina     none recently  . Asthma   . Blood transfusion   . GERD (gastroesophageal reflux disease)   . Hiatal hernia   . Obstructive sleep apnea   . Other forms of migraine, without mention of intractable migraine without mention of status migrainosus   . Chest pain   . Dry eye     Chronic  . Hypercholesteremia   . Lumbago   . Lumbar radiculopathy   . Spondylolisthesis   . Diverticulosis of colon (without mention of hemorrhage)     Colonoscopy 11/09/09 - FINDINGS: 1) 10mm polp  in transverse colon, resected & retrieved.  2) Diverticulosis in sigmoid & descending colon.  3) Medium-sized, internal hemorrhoids.  . Internal hemorrhoids without mention of complication     Colonoscopy 11/09/09 - FINDINGS: 1) 10mm polp in transverse colon, resected & retrieved.  2) Diverticulosis in sigmoid & descending colon.  3) Medium-sized, internal hemorrhoids.  . Benign neoplasm of colon 11/09/09    Colonoscopy 11/09/09 - FINDINGS: 1) 10mm polp in transverse colon, resected & retrieved.  2) Diverticulosis in sigmoid & descending colon.  3) Medium-sized, internal hemorrhoids.  . Hemorrhage of rectum and anus     Colonoscopy 11/09/09 - FINDINGS: 1) 10mm polp in transverse colon, resected & retrieved.  2) Diverticulosis in sigmoid & descending colon.  3) Medium-sized, internal hemorrhoids.  . Status post placement of implantable loop recorder 11/15/06    Medtronic -Model # [id number] -Seriel # [id number] H  . Essential hypertension, benign   . Esophagitis   . SOB (shortness of breath)     At rest & on exertion  . CMC arthritis     Left middle finger  . Depression     40+ years  . Diabetes mellitus 10/2008    type 2  . Breast cancer 1989    Left breast cancer  . Obese   . Syncope     ILR impanted 11/15/06 Medtronic -Model # [id number] -Seriel # [id number] H  . Facet arthropathy, lumbar     L3-4  . Preoperative clearance     NM Myocar Multi W/Spect W/Wall Motion EF 08/07/10  IMPRESSION: 1) No evidence of ischemia.  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  3) Normal LV wall motion.  4) Estimated Q G S EF 70%, unchanged.    ROS:   All systems reviewed and negative except as noted in the HPI.   Past Surgical History  Procedure Laterality Date  . Abdominal hysterectomy  1978  . Joint replacement      lsft hip x2, rt knee  . Knee arthroscopy Right 2002    By Dr. Ophelia Charter  . Foot surgery Bilateral     Bil feet great toes and second toes spurs. By Dr.  Hyatt in 2004.  . Breast biopsy Left 1995    Biopsy at [facility] ([facility])  . Tonsillectomy    . Back surgery      x2 one for infection  . Cholecystectomy    . Total hip revision  12/14/2010    Procedure: TOTAL HIP REVISION;  Surgeon: Eldred Manges;  Location: [facility] OR;  Service: Orthopedics;  Laterality: Left;  Left Total Hip Arthroplasty Revision, Poly and Ball Exchange, Possible Acetabular Revision vs. Poly Exchange  . Colonoscopy w/ polypectomy  11/09/09    FINDINGS: 1) 10mm polp in transverse colon, resected & retrieved.  2) Diverticulosis in sigmoid & descending colon.  3) Medium-sized, internal hemorrhoids.  . Loop recorder implant  11/15/06    For recurring syncope. Medtronic -Model # [id number] -Seriel # [id number]  . Hemorrhoid surgery  2011    Internal & external hemorrhoidectomy (x) 3 quadrants  . Esophagogastroduodenoscopy  02/15/07  . Dilation and curettage of uterus       Family History  Problem Relation Age of Onset  . Pancreatic cancer Mother   . Heart attack Father   . Diabetes Son   . Arthritis Brother     Severe/Crippling  . Tuberculosis Brother   . Hypertension Brother     x 2  . Heart disease Sister   . Diabetes Brother   . Seizures Brother   . Autoimmune disease Brother     AIDS  . Colon cancer Brother   . Lung cancer Brother     mets     History   Social History  . Marital Status: Single    Spouse Name: N/A    Number of Children: 6  . Years of Education: N/A   Occupational History  . Retired    Social History Main Topics  . Smoking status: Former Smoker    Types: Cigarettes    Quit date: 01/19/1983  . Smokeless tobacco: Never Used  . Alcohol Use: No  . Drug Use: No  . Sexual Activity: Not on file   Other Topics Concern  . Not on file   Social History Narrative  . No narrative on file     BP 108/68  Pulse 93  Ht 5' 4.75" (1.645 m)  Wt 226 lb 6.4 oz (102.694 kg)  BMI 37.95 kg/m2  SpO2 95%  Physical Exam:  obese  appearing 67 year old woman, NAD HEENT: Unremarkable Neck:  No JVD, no thyromegally Back:  No CVA tenderness Lungs:  Clear with no wheezes, rales, or rhonchi. HEART:  Regular rate rhythm, no murmurs, no rubs, no clicks Abd:  soft, positive bowel sounds, no organomegally, no rebound, no guarding Ext:  2 plus pulses, no edema, no cyanosis, no clubbing Skin:  No rashes no nodules Neuro:  CN II through XII intact, motor grossly intact  Cardiac monitor - sinus rhythm with PACs and PVCs   Assess/Plan:

## 2012-11-21 NOTE — Patient Instructions (Signed)
Your physician recommends that you schedule a follow-up appointment in: 4 months with Dr Ladona Ridgel  Your physician has recommended you make the following change in your medication:  1) Stop Carvedilol 2) Start Cardizem 180mg  daily

## 2012-11-21 NOTE — Assessment & Plan Note (Signed)
Her blood pressure is well controlled. We half changed her medications as previously noted.

## 2012-11-23 ENCOUNTER — Encounter: Payer: Self-pay | Admitting: Gastroenterology

## 2012-11-23 ENCOUNTER — Ambulatory Visit (AMBULATORY_SURGERY_CENTER): Payer: PRIVATE HEALTH INSURANCE | Admitting: Gastroenterology

## 2012-11-23 VITALS — BP 132/83 | HR 73 | Temp 97.3°F | Resp 52 | Ht 64.0 in | Wt 220.0 lb

## 2012-11-23 DIAGNOSIS — D131 Benign neoplasm of stomach: Secondary | ICD-10-CM

## 2012-11-23 DIAGNOSIS — R1013 Epigastric pain: Secondary | ICD-10-CM

## 2012-11-23 LAB — GLUCOSE, CAPILLARY
Glucose-Capillary: 99 mg/dL (ref 70–99)
Glucose-Capillary: 102 mg/dL — ABNORMAL HIGH (ref 70–99)

## 2012-11-23 MED ORDER — SODIUM CHLORIDE 0.9 % IV SOLN: 500.0000 mL | INTRAVENOUS | Status: DC

## 2012-11-23 MED ORDER — HYOSCYAMINE SULFATE ER 0.375 MG PO TBCR: EXTENDED_RELEASE_TABLET | ORAL | Status: DC

## 2012-11-23 NOTE — Patient Instructions (Signed)
YOU HAD AN ENDOSCOPIC PROCEDURE TODAY AT THE [facility]: Refer to the procedure report that was given to you for any specific questions about what was found during the examination.  If the procedure report does not answer your questions, please call your gastroenterologist to clarify.  If you requested that your care partner not be given the details of your procedure findings, then the procedure report has been included in a sealed envelope for you to review at your convenience later.  YOU SHOULD EXPECT: Some feelings of bloating in the abdomen. Passage of more gas than usual.  Walking can help get rid of the air that was put into your GI tract during the procedure and reduce the bloating. If you had a lower endoscopy (such as a colonoscopy or flexible sigmoidoscopy) you may notice spotting of blood in your stool or on the toilet paper. If you underwent a bowel prep for your procedure, then you may not have a normal bowel movement for a few days.  DIET: Your first meal following the procedure should be a light meal and then it is ok to progress to your normal diet.  A half-sandwich or bowl of soup is an example of a good first meal.  Heavy or fried foods are harder to digest and may make you feel nauseous or bloated.  Likewise meals heavy in dairy and vegetables can cause extra gas to form and this can also increase the bloating.  Drink plenty of fluids but you should avoid alcoholic beverages for 24 hours.  ACTIVITY: Your care partner should take you home directly after the procedure.  You should plan to take it easy, moving slowly for the rest of the day.  You can resume normal activity the day after the procedure however you should NOT DRIVE or use heavy machinery for 24 hours (because of the sedation medicines used during the test).    SYMPTOMS TO REPORT IMMEDIATELY: A gastroenterologist can be reached at any hour.  During normal business hours, 8:30 AM to 5:00 PM Monday through Friday,  call [phone].  After hours and on weekends, please call the GI answering service at [phone] who will take a message and have the physician on call contact you.    Following upper endoscopy (EGD)  Vomiting of blood or coffee ground material  New chest pain or pain under the shoulder blades  Painful or persistently difficult swallowing  New shortness of breath  Fever of 100F or higher  Black, tarry-looking stools  FOLLOW UP: If any biopsies were taken you will be contacted by phone or by letter within the next 1-3 weeks.  Call your gastroenterologist if you have not heard about the biopsies in 3 weeks.  Our staff will call the home number listed on your records the next business day following your procedure to check on you and address any questions or concerns that you may have at that time regarding the information given to you following your procedure. This is a courtesy call and so if there is no answer at the home number and we have not heard from you through the emergency physician on call, we will assume that you have returned to your regular daily activities without incident.  SIGNATURES/CONFIDENTIALITY: You and/or your care partner have signed paperwork which will be entered into your electronic medical record.  These signatures attest to the fact that that the information above on your After Visit Summary has been reviewed and is understood.  Full   responsibility of the confidentiality of this discharge information lies with you and/or your care-partner.    Dr. Marzetta Board 3rd floor nurse will call to set up an appointment for a Gastric Emptying scan to see how well the stomach is funtioning. Trial of HYOMAX. You may resume your current medications today. Please call if any questions or concerns.

## 2012-11-23 NOTE — Progress Notes (Signed)
Report to pacu rn, vss, bbs=clear 

## 2012-11-23 NOTE — Op Note (Addendum)
[facility] [address] [place], [address]   ENDOSCOPY PROCEDURE REPORT  PATIENT: [name], [name]  MR#: [id number] BIRTHDATE: 1945/11/08 , 67  yrs. old GENDER: Female ENDOSCOPIST: Louis Meckel, MD REFERRED BY:  Clyda Greener, M.D. PROCEDURE DATE:  11/23/2012 PROCEDURE:  EGD ASA CLASS:     Class II INDICATIONS:  Epigastric pain.   Nausea. MEDICATIONS: Versed, propofol (Diprivan) 100mg  IV, and Simethicone 0.6cc PO TOPICAL ANESTHETIC: Cetacaine Spray  DESCRIPTION OF PROCEDURE: After the risks benefits and alternatives of the procedure were thoroughly explained, informed consent was obtained.  The LB [id number] endoscope was introduced through the mouth and advanced to the third portion of the duodenum. Without limitations.  The instrument was slowly withdrawn as the mucosa was fully examined.      In the gastric fundus there were multiple 1-2 mm sessile polyps consistent with fundic gland polyps.   The remainder of the upper endoscopy exam was otherwise normal.  Retroflexed views revealed no abnormalities.     The scope was then withdrawn from the patient and the procedure completed.  COMPLICATIONS: There were no complications. ENDOSCOPIC IMPRESSION: 1.   In the gastric fundus there were multiple 1-2 mm sessile polyps consistent with fundic gland polyps. 2.   The remainder of the upper endoscopy exam was otherwise normal  RECOMMENDATIONS: 1.  My office will arrange for you to have a Gastric Emptying Scan performed.  This is a radiology test that gives an idea of how well your stomach functions. 2.  trial of hyomax  REPEAT EXAM:  eSigned:  Louis Meckel, MD 02/05/2013 3:18 PM Revised: 02/05/2013 3:18 PM  CC:

## 2012-11-23 NOTE — Progress Notes (Signed)
No complaints noted in the recovery room. Maw   

## 2012-11-24 ENCOUNTER — Telehealth: Payer: Self-pay

## 2012-11-24 ENCOUNTER — Other Ambulatory Visit: Payer: Self-pay

## 2012-11-24 ENCOUNTER — Telehealth: Payer: Self-pay | Admitting: Internal Medicine

## 2012-11-24 DIAGNOSIS — R109 Unspecified abdominal pain: Secondary | ICD-10-CM

## 2012-11-24 NOTE — Telephone Encounter (Signed)
  Follow up Call-  Call back number 11/23/2012  Post procedure Call Back phone  # [phone]  Permission to leave phone message Yes     Patient questions:  Do you have a fever, pain , or abdominal swelling? no Pain Score  0 *  Have you tolerated food without any problems? yes  Have you been able to return to your normal activities? yes  Do you have any questions about your discharge instructions: Diet   no Medications  no Follow up visit  no  Do you have questions or concerns about your Care? no  Actions: * If pain score is 4 or above: No action needed, pain <4.

## 2012-11-24 NOTE — Telephone Encounter (Signed)
Pt scheduled for GES at [facility] 12/11/12, pt to arrive there at 7:15am for a 7:30am appt. Pt to be NPO after midnight and hold Dexilant. Pt aware of appt.

## 2012-11-24 NOTE — Telephone Encounter (Signed)
New problem   Pt need to know what medication dr Ladona Ridgel took her off please call pt.

## 2012-11-24 NOTE — Telephone Encounter (Signed)
Spoke with the patient, she lost her instruction sheet.  The medication is Carvedilol.

## 2012-11-28 ENCOUNTER — Ambulatory Visit (INDEPENDENT_AMBULATORY_CARE_PROVIDER_SITE_OTHER): Payer: Medicare Other | Admitting: Podiatry

## 2012-11-28 ENCOUNTER — Encounter: Payer: Self-pay | Admitting: Podiatry

## 2012-11-28 VITALS — BP 112/68 | HR 96 | Resp 16

## 2012-11-28 DIAGNOSIS — M79609 Pain in unspecified limb: Secondary | ICD-10-CM

## 2012-11-28 DIAGNOSIS — B351 Tinea unguium: Secondary | ICD-10-CM

## 2012-11-28 NOTE — Progress Notes (Signed)
Dona presents today with a chief complaint of painful elongated nails 1 through 5 bilateral.  Objective: Pulses remain strongly palpable bilateral there is no erythema edema saline is drainage or odor. Nails are thick yellow dystrophic clinically mycotic.  Assessment: Pain in limb secondary to onychomycosis bilateral.  Plan: Debridement of nails thickness and length as cover service sector to pain.

## 2012-12-06 ENCOUNTER — Other Ambulatory Visit: Payer: Self-pay | Admitting: Neurosurgery

## 2012-12-06 DIAGNOSIS — M549 Dorsalgia, unspecified: Secondary | ICD-10-CM

## 2012-12-11 ENCOUNTER — Encounter (HOSPITAL_COMMUNITY): Payer: Self-pay

## 2012-12-11 ENCOUNTER — Encounter (HOSPITAL_COMMUNITY)
Admission: RE | Admit: 2012-12-11 | Discharge: 2012-12-11 | Disposition: A | Discharge status: Home / Self Care | Payer: PRIVATE HEALTH INSURANCE | Source: Ambulatory Visit | Attending: Gastroenterology | Admitting: Gastroenterology

## 2012-12-11 DIAGNOSIS — R109 Unspecified abdominal pain: Secondary | ICD-10-CM | POA: Insufficient documentation

## 2012-12-11 MED ORDER — TECHNETIUM TC 99M SULFUR COLLOID
2.0000 | Freq: Once | INTRAVENOUS | Status: AC | PRN
  Administered 2012-12-11: 2 via INTRAVENOUS

## 2012-12-12 ENCOUNTER — Other Ambulatory Visit: Payer: Self-pay

## 2012-12-12 MED ORDER — METOCLOPRAMIDE HCL 10 MG PO TABS: 10.0000 mg | ORAL_TABLET | Freq: Three times a day (TID) | ORAL | Status: DC

## 2012-12-19 ENCOUNTER — Ambulatory Visit
Admission: RE | Admit: 2012-12-19 | Discharge: 2012-12-19 | Disposition: A | Discharge status: Home / Self Care | Payer: PRIVATE HEALTH INSURANCE | Source: Ambulatory Visit | Attending: Neurosurgery | Admitting: Neurosurgery

## 2012-12-19 DIAGNOSIS — M549 Dorsalgia, unspecified: Secondary | ICD-10-CM

## 2012-12-22 ENCOUNTER — Other Ambulatory Visit: Payer: Self-pay | Admitting: Orthopedic Surgery

## 2012-12-28 ENCOUNTER — Encounter (HOSPITAL_BASED_OUTPATIENT_CLINIC_OR_DEPARTMENT_OTHER): Payer: Self-pay | Admitting: *Deleted

## 2012-12-28 NOTE — Progress Notes (Signed)
To come in for bmet-saw cardiology 11/14-cleared for surgery- Uses oxygen at night-did not need cpap

## 2012-12-29 ENCOUNTER — Encounter (HOSPITAL_BASED_OUTPATIENT_CLINIC_OR_DEPARTMENT_OTHER)
Admission: RE | Admit: 2012-12-29 | Discharge: 2012-12-29 | Disposition: A | Discharge status: Home / Self Care | Payer: PRIVATE HEALTH INSURANCE | Source: Ambulatory Visit | Attending: Orthopedic Surgery | Admitting: Orthopedic Surgery

## 2012-12-29 DIAGNOSIS — Z01812 Encounter for preprocedural laboratory examination: Secondary | ICD-10-CM | POA: Insufficient documentation

## 2012-12-29 DIAGNOSIS — Z01818 Encounter for other preprocedural examination: Secondary | ICD-10-CM | POA: Insufficient documentation

## 2012-12-29 LAB — BASIC METABOLIC PANEL
BUN: 7 mg/dL (ref 6–23)
CO2: 24 mEq/L (ref 19–32)
Calcium: 9.1 mg/dL (ref 8.4–10.5)
Chloride: 104 mEq/L (ref 96–112)
GFR calc non Af Amer: 69 mL/min — ABNORMAL LOW (ref >90)
Glucose, Bld: 145 mg/dL — ABNORMAL HIGH (ref 70–99)
Potassium: 4.1 mEq/L (ref 3.5–5.1)
Sodium: 141 mEq/L (ref 135–145)

## 2013-01-01 ENCOUNTER — Other Ambulatory Visit: Payer: Self-pay | Admitting: Orthopedic Surgery

## 2013-01-02 ENCOUNTER — Ambulatory Visit (HOSPITAL_BASED_OUTPATIENT_CLINIC_OR_DEPARTMENT_OTHER): Payer: PRIVATE HEALTH INSURANCE | Admitting: Anesthesiology

## 2013-01-02 ENCOUNTER — Encounter (HOSPITAL_BASED_OUTPATIENT_CLINIC_OR_DEPARTMENT_OTHER): Payer: Self-pay | Admitting: Orthopedic Surgery

## 2013-01-02 ENCOUNTER — Ambulatory Visit (HOSPITAL_BASED_OUTPATIENT_CLINIC_OR_DEPARTMENT_OTHER)
Admission: RE | Admit: 2013-01-02 | Discharge: 2013-01-02 | Disposition: A | Discharge status: Home / Self Care | Payer: PRIVATE HEALTH INSURANCE | Source: Ambulatory Visit | Attending: Orthopedic Surgery | Admitting: Orthopedic Surgery

## 2013-01-02 ENCOUNTER — Encounter (HOSPITAL_BASED_OUTPATIENT_CLINIC_OR_DEPARTMENT_OTHER)
Admission: RE | Disposition: A | Discharge status: Home / Self Care | Payer: Self-pay | Source: Ambulatory Visit | Attending: Orthopedic Surgery

## 2013-01-02 ENCOUNTER — Encounter (HOSPITAL_BASED_OUTPATIENT_CLINIC_OR_DEPARTMENT_OTHER): Payer: PRIVATE HEALTH INSURANCE | Admitting: Anesthesiology

## 2013-01-02 DIAGNOSIS — E669 Obesity, unspecified: Secondary | ICD-10-CM | POA: Insufficient documentation

## 2013-01-02 DIAGNOSIS — F329 Major depressive disorder, single episode, unspecified: Secondary | ICD-10-CM | POA: Insufficient documentation

## 2013-01-02 DIAGNOSIS — E7800 Pure hypercholesterolemia, unspecified: Secondary | ICD-10-CM | POA: Insufficient documentation

## 2013-01-02 DIAGNOSIS — K449 Diaphragmatic hernia without obstruction or gangrene: Secondary | ICD-10-CM | POA: Insufficient documentation

## 2013-01-02 DIAGNOSIS — M19049 Primary osteoarthritis, unspecified hand: Secondary | ICD-10-CM | POA: Insufficient documentation

## 2013-01-02 DIAGNOSIS — K219 Gastro-esophageal reflux disease without esophagitis: Secondary | ICD-10-CM | POA: Insufficient documentation

## 2013-01-02 DIAGNOSIS — K648 Other hemorrhoids: Secondary | ICD-10-CM | POA: Insufficient documentation

## 2013-01-02 DIAGNOSIS — Z79899 Other long term (current) drug therapy: Secondary | ICD-10-CM | POA: Insufficient documentation

## 2013-01-02 DIAGNOSIS — M109 Gout, unspecified: Secondary | ICD-10-CM | POA: Insufficient documentation

## 2013-01-02 DIAGNOSIS — G4733 Obstructive sleep apnea (adult) (pediatric): Secondary | ICD-10-CM | POA: Insufficient documentation

## 2013-01-02 DIAGNOSIS — I10 Essential (primary) hypertension: Secondary | ICD-10-CM | POA: Insufficient documentation

## 2013-01-02 DIAGNOSIS — M6530 Trigger finger, unspecified finger: Secondary | ICD-10-CM | POA: Insufficient documentation

## 2013-01-02 DIAGNOSIS — H04129 Dry eye syndrome of unspecified lacrimal gland: Secondary | ICD-10-CM | POA: Insufficient documentation

## 2013-01-02 DIAGNOSIS — Z01812 Encounter for preprocedural laboratory examination: Secondary | ICD-10-CM | POA: Insufficient documentation

## 2013-01-02 DIAGNOSIS — M4120 Other idiopathic scoliosis, site unspecified: Secondary | ICD-10-CM | POA: Insufficient documentation

## 2013-01-02 DIAGNOSIS — G43909 Migraine, unspecified, not intractable, without status migrainosus: Secondary | ICD-10-CM | POA: Insufficient documentation

## 2013-01-02 DIAGNOSIS — I252 Old myocardial infarction: Secondary | ICD-10-CM | POA: Insufficient documentation

## 2013-01-02 DIAGNOSIS — F3289 Other specified depressive episodes: Secondary | ICD-10-CM | POA: Insufficient documentation

## 2013-01-02 DIAGNOSIS — J45909 Unspecified asthma, uncomplicated: Secondary | ICD-10-CM | POA: Insufficient documentation

## 2013-01-02 DIAGNOSIS — E119 Type 2 diabetes mellitus without complications: Secondary | ICD-10-CM | POA: Insufficient documentation

## 2013-01-02 DIAGNOSIS — Z853 Personal history of malignant neoplasm of breast: Secondary | ICD-10-CM | POA: Insufficient documentation

## 2013-01-02 DIAGNOSIS — K5730 Diverticulosis of large intestine without perforation or abscess without bleeding: Secondary | ICD-10-CM | POA: Insufficient documentation

## 2013-01-02 DIAGNOSIS — M4712 Other spondylosis with myelopathy, cervical region: Secondary | ICD-10-CM | POA: Insufficient documentation

## 2013-01-02 HISTORY — PX: TRIGGER FINGER RELEASE: SHX641

## 2013-01-02 LAB — GLUCOSE, CAPILLARY: Glucose-Capillary: 75 mg/dL (ref 70–99)

## 2013-01-02 SURGERY — RELEASE, A1 PULLEY, FOR TRIGGER FINGER
Anesthesia: General | Site: Finger | Laterality: Left

## 2013-01-02 MED ORDER — HYDROMORPHONE HCL PF 1 MG/ML IJ SOLN
0.2500 mg | INTRAMUSCULAR | Status: DC | PRN
  Administered 2013-01-02 (×2): 0.5 mg via INTRAVENOUS

## 2013-01-02 MED ORDER — FENTANYL CITRATE 0.05 MG/ML IJ SOLN: 50.0000 ug | INTRAMUSCULAR | Status: DC | PRN

## 2013-01-02 MED ORDER — MIDAZOLAM HCL 5 MG/5ML IJ SOLN
INTRAMUSCULAR | Status: DC | PRN
  Administered 2013-01-02 (×2): 1 mg via INTRAVENOUS

## 2013-01-02 MED ORDER — HYDROMORPHONE HCL PF 1 MG/ML IJ SOLN
INTRAMUSCULAR | Status: AC
  Filled 2013-01-02: qty 1

## 2013-01-02 MED ORDER — MIDAZOLAM HCL 2 MG/2ML IJ SOLN: 1.0000 mg | INTRAMUSCULAR | Status: DC | PRN

## 2013-01-02 MED ORDER — BUPIVACAINE HCL (PF) 0.25 % IJ SOLN
INTRAMUSCULAR | Status: DC | PRN
  Administered 2013-01-02: 6 mL

## 2013-01-02 MED ORDER — PROPOFOL 10 MG/ML IV BOLUS
INTRAVENOUS | Status: DC | PRN
  Administered 2013-01-02: 50 mg via INTRAVENOUS
  Administered 2013-01-02: 150 mg via INTRAVENOUS

## 2013-01-02 MED ORDER — SUCCINYLCHOLINE CHLORIDE 20 MG/ML IJ SOLN
INTRAMUSCULAR | Status: DC | PRN
  Administered 2013-01-02: 80 mg via INTRAVENOUS

## 2013-01-02 MED ORDER — FENTANYL CITRATE 0.05 MG/ML IJ SOLN
INTRAMUSCULAR | Status: DC | PRN
  Administered 2013-01-02: 50 ug via INTRAVENOUS

## 2013-01-02 MED ORDER — 0.9 % SODIUM CHLORIDE (POUR BTL) OPTIME
TOPICAL | Status: DC | PRN
  Administered 2013-01-02: 200 mL

## 2013-01-02 MED ORDER — CEFAZOLIN SODIUM-DEXTROSE 2-3 GM-% IV SOLR
2.0000 g | INTRAVENOUS | Status: AC
  Administered 2013-01-02: 2 g via INTRAVENOUS

## 2013-01-02 MED ORDER — DEXAMETHASONE SODIUM PHOSPHATE 4 MG/ML IJ SOLN
INTRAMUSCULAR | Status: DC | PRN
  Administered 2013-01-02: 5 mg via INTRAVENOUS

## 2013-01-02 MED ORDER — CHLORHEXIDINE GLUCONATE 4 % EX LIQD: 60.0000 mL | Freq: Once | CUTANEOUS | Status: DC

## 2013-01-02 MED ORDER — LIDOCAINE HCL (CARDIAC) 20 MG/ML IV SOLN
INTRAVENOUS | Status: DC | PRN
  Administered 2013-01-02: 60 mg via INTRAVENOUS

## 2013-01-02 MED ORDER — FENTANYL CITRATE 0.05 MG/ML IJ SOLN
INTRAMUSCULAR | Status: AC
  Filled 2013-01-02: qty 2

## 2013-01-02 MED ORDER — CEFAZOLIN SODIUM-DEXTROSE 2-3 GM-% IV SOLR
INTRAVENOUS | Status: AC
  Filled 2013-01-02: qty 50

## 2013-01-02 MED ORDER — PROPOFOL 10 MG/ML IV EMUL
INTRAVENOUS | Status: AC
  Filled 2013-01-02: qty 50

## 2013-01-02 MED ORDER — ONDANSETRON HCL 4 MG/2ML IJ SOLN
INTRAMUSCULAR | Status: DC | PRN
  Administered 2013-01-02: 4 mg via INTRAVENOUS

## 2013-01-02 MED ORDER — OXYCODONE HCL 5 MG PO TABS: 5.0000 mg | ORAL_TABLET | Freq: Once | ORAL | Status: DC | PRN

## 2013-01-02 MED ORDER — MIDAZOLAM HCL 2 MG/2ML IJ SOLN
INTRAMUSCULAR | Status: AC
  Filled 2013-01-02: qty 2

## 2013-01-02 MED ORDER — LACTATED RINGERS IV SOLN
INTRAVENOUS | Status: DC
  Administered 2013-01-02: 09:00:00 via INTRAVENOUS

## 2013-01-02 MED ORDER — BUPIVACAINE HCL (PF) 0.25 % IJ SOLN
INTRAMUSCULAR | Status: AC
  Filled 2013-01-02: qty 30

## 2013-01-02 MED ORDER — HYDROCODONE-ACETAMINOPHEN 5-325 MG PO TABS: 1.0000 | ORAL_TABLET | Freq: Four times a day (QID) | ORAL | Status: DC | PRN

## 2013-01-02 MED ORDER — OXYCODONE HCL 5 MG/5ML PO SOLN: 5.0000 mg | Freq: Once | ORAL | Status: DC | PRN

## 2013-01-02 SURGICAL SUPPLY — 36 items
BANDAGE COBAN STERILE 2 (GAUZE/BANDAGES/DRESSINGS) ×2 IMPLANT
BLADE SURG 15 STRL LF DISP TIS (BLADE) ×1 IMPLANT
BLADE SURG 15 STRL SS (BLADE) ×1
BNDG ESMARK 4X9 LF (GAUZE/BANDAGES/DRESSINGS) ×2 IMPLANT
CHLORAPREP W/TINT 26ML (MISCELLANEOUS) ×2 IMPLANT
CORDS BIPOLAR (ELECTRODE) ×2 IMPLANT
COVER MAYO STAND STRL (DRAPES) ×2 IMPLANT
COVER TABLE BACK 60X90 (DRAPES) ×2 IMPLANT
CUFF TOURNIQUET SINGLE 18IN (TOURNIQUET CUFF) ×2 IMPLANT
DECANTER SPIKE VIAL GLASS SM (MISCELLANEOUS) IMPLANT
DRAPE EXTREMITY T 121X128X90 (DRAPE) ×2 IMPLANT
DRAPE SURG 17X23 STRL (DRAPES) ×2 IMPLANT
GAUZE XEROFORM 1X8 LF (GAUZE/BANDAGES/DRESSINGS) ×2 IMPLANT
GLOVE BIO SURGEON STRL SZ7.5 (GLOVE) ×2 IMPLANT
GLOVE BIOGEL PI IND STRL 7.0 (GLOVE) ×1 IMPLANT
GLOVE BIOGEL PI IND STRL 8 (GLOVE) ×1 IMPLANT
GLOVE BIOGEL PI IND STRL 8.5 (GLOVE) ×1 IMPLANT
GLOVE BIOGEL PI INDICATOR 7.0 (GLOVE) ×1
GLOVE BIOGEL PI INDICATOR 8 (GLOVE) ×1
GLOVE BIOGEL PI INDICATOR 8.5 (GLOVE) ×1
GLOVE ECLIPSE 6.5 STRL STRAW (GLOVE) ×2 IMPLANT
GLOVE SURG ORTHO 8.0 STRL STRW (GLOVE) ×2 IMPLANT
GOWN BRE IMP PREV XXLGXLNG (GOWN DISPOSABLE) ×4 IMPLANT
GOWN PREVENTION PLUS XLARGE (GOWN DISPOSABLE) ×2 IMPLANT
NEEDLE 27GAX1X1/2 (NEEDLE) ×2 IMPLANT
NS IRRIG 1000ML POUR BTL (IV SOLUTION) ×2 IMPLANT
PACK BASIN DAY SURGERY FS (CUSTOM PROCEDURE TRAY) ×2 IMPLANT
PADDING CAST ABS 4INX4YD NS (CAST SUPPLIES) ×1
PADDING CAST ABS COTTON 4X4 ST (CAST SUPPLIES) ×1 IMPLANT
SPONGE GAUZE 4X4 12PLY (GAUZE/BANDAGES/DRESSINGS) ×2 IMPLANT
STOCKINETTE 4X48 STRL (DRAPES) ×2 IMPLANT
SUT VICRYL RAPIDE 4/0 PS 2 (SUTURE) ×2 IMPLANT
SYR BULB 3OZ (MISCELLANEOUS) ×2 IMPLANT
SYR CONTROL 10ML LL (SYRINGE) ×2 IMPLANT
TOWEL OR 17X24 6PK STRL BLUE (TOWEL DISPOSABLE) ×4 IMPLANT
UNDERPAD 30X30 INCONTINENT (UNDERPADS AND DIAPERS) ×2 IMPLANT

## 2013-01-02 NOTE — Op Note (Signed)
Dictation Number [id number]
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
## 2013-01-02 NOTE — Anesthesia Preprocedure Evaluation (Signed)
Anesthesia Evaluation  Patient identified by MRN, date of birth, ID band Patient awake    Reviewed: Allergy & Precautions, H&P , NPO status , Patient's Chart, lab work & pertinent test results  Airway Mallampati: II TM Distance: >3 FB Neck ROM: Full    Dental no notable dental hx. (+) Upper Dentures, Lower Dentures and Dental Advisory Given   Pulmonary asthma , former smoker,  breath sounds clear to auscultation  Pulmonary exam normal       Cardiovascular hypertension, On Medications + Past MI + dysrhythmias Rhythm:Regular Rate:Normal     Neuro/Psych  Headaches, negative psych ROS   GI/Hepatic Neg liver ROS, hiatal hernia, GERD-  Medicated and Poorly Controlled,  Endo/Other  diabetes, Type 2, Oral Hypoglycemic AgentsMorbid obesity  Renal/GU negative Renal ROS  negative genitourinary   Musculoskeletal   Abdominal   Peds  Hematology negative hematology ROS (+)   Anesthesia Other Findings   Reproductive/Obstetrics negative OB ROS                           Anesthesia Physical Anesthesia Plan  ASA: III  Anesthesia Plan: General   Post-op Pain Management:    Induction: Intravenous  Airway Management Planned: Oral ETT  Additional Equipment:   Intra-op Plan:   Post-operative Plan: Extubation in OR  Informed Consent: I have reviewed the patients History and Physical, chart, labs and discussed the procedure including the risks, benefits and alternatives for the proposed anesthesia with the patient or authorized representative who has indicated his/her understanding and acceptance.   Dental advisory given  Plan Discussed with: CRNA  Anesthesia Plan Comments:         Anesthesia Quick Evaluation

## 2013-01-02 NOTE — Anesthesia Postprocedure Evaluation (Signed)
  Anesthesia Post-op Note  Patient: [name]  Procedure(s) Performed: Procedure(s) with comments: RELEASE LEFT MIDDLE TRIGGER FINGER/LEFT INDEX AND LEFT RING FINGER (Left) - ANESTHESIA: GENERAL/IV REGIONAL FAB  Patient Location: PACU  Anesthesia Type:General  Level of Consciousness: awake and alert   Airway and Oxygen Therapy: Patient Spontanous Breathing  Post-op Pain: moderate  Post-op Assessment: Post-op Vital signs reviewed, Patient's Cardiovascular Status Stable and Respiratory Function Stable  Post-op Vital Signs: Reviewed  Filed Vitals:   01/02/13 1045  BP: 133/67  Pulse: 84  Temp:   Resp: 17    Complications: No apparent anesthesia complications

## 2013-01-02 NOTE — Brief Op Note (Signed)
01/02/2013  10:00 AM  PATIENT:  Annett Gula [place]  67 y.o. female  PRE-OPERATIVE DIAGNOSIS:  STENOSING TENOSYNOVITIS LEFT MIDDLE FINGER  POST-OPERATIVE DIAGNOSIS:  Stenosing Tenosynovitis Left Middle, Ring, and Index Finger  PROCEDURE:  Procedure(s) with comments: RELEASE LEFT MIDDLE TRIGGER FINGER/LEFT INDEX AND LEFT RING FINGER (Left) - ANESTHESIA: GENERAL/IV REGIONAL FAB  SURGEON:  Surgeon(s) and Role:    * Nicki Reaper, MD - Primary    * Tami Ribas, MD - Assisting  PHYSICIAN ASSISTANT:   ASSISTANTS: k Dulcemaria Bula,md   ANESTHESIA:   local and regional  EBL:  Total I/O In: 200 [I.V.:200] Out: -   BLOOD ADMINISTERED:none  DRAINS: none   LOCAL MEDICATIONS USED:  BUPIVICAINE   SPECIMEN:  No Specimen  DISPOSITION OF SPECIMEN:  N/A  COUNTS:  YES  TOURNIQUET:   Total Tourniquet Time Documented: area (Left) - 14 minutes Total: area (Left) - 14 minutes   DICTATION: .Other Dictation: Dictation Number [id number]  PLAN OF CARE: Discharge to home after PACU  PATIENT DISPOSITION:  PACU - hemodynamically stable.

## 2013-01-02 NOTE — Anesthesia Procedure Notes (Signed)
Procedure Name: Intubation Date/Time: 01/02/2013 9:27 AM Performed by: Burna Cash Pre-anesthesia Checklist: Patient identified, Emergency Drugs available, Suction available and Patient being monitored Patient Re-evaluated:Patient Re-evaluated prior to inductionOxygen Delivery Method: Circle System Utilized Preoxygenation: Pre-oxygenation with 100% oxygen Intubation Type: IV induction Ventilation: Mask ventilation without difficulty Laryngoscope Size: Mac and 3 Grade View: Grade I Tube type: Oral Tube size: 7.0 mm Number of attempts: 1 Airway Equipment and Method: stylet and oral airway Placement Confirmation: ETT inserted through vocal cords under direct vision,  positive ETCO2 and breath sounds checked- equal and bilateral Secured at: 22 cm Tube secured with: Tape Dental Injury: Teeth and Oropharynx as per pre-operative assessment

## 2013-01-02 NOTE — Transfer of Care (Signed)
Immediate Anesthesia Transfer of Care Note  Patient: [name]  Procedure(s) Performed: Procedure(s) with comments: RELEASE LEFT MIDDLE TRIGGER FINGER/LEFT INDEX AND LEFT RING FINGER (Left) - ANESTHESIA: GENERAL/IV REGIONAL FAB  Patient Location: PACU  Anesthesia Type:General  Level of Consciousness: sedated  Airway & Oxygen Therapy: Patient Spontanous Breathing and Patient connected to face mask oxygen  Post-op Assessment: Report given to PACU RN and Post -op Vital signs reviewed and stable  Post vital signs: Reviewed and stable  Complications: No apparent anesthesia complications

## 2013-01-03 ENCOUNTER — Ambulatory Visit: Payer: PRIVATE HEALTH INSURANCE | Admitting: Gastroenterology

## 2013-01-04 ENCOUNTER — Encounter (HOSPITAL_BASED_OUTPATIENT_CLINIC_OR_DEPARTMENT_OTHER): Payer: Self-pay | Admitting: Orthopedic Surgery

## 2013-01-05 ENCOUNTER — Other Ambulatory Visit: Payer: Self-pay | Admitting: Gastroenterology

## 2013-01-05 NOTE — Op Note (Signed)
[name], [name]                 ACCOUNT NO.:  [id number]  MEDICAL RECORD NO.:  [id number]  LOCATION:                               FACILITY:  [facility]  PHYSICIAN:  Cindee Salt, M.D.       DATE OF BIRTH:  08/29/1945  DATE OF PROCEDURE:  01/02/2013 DATE OF DISCHARGE:  01/02/2013                              OPERATIVE REPORT   PREOPERATIVE DIAGNOSIS:  Stenosing tenosynovitis, left index, middle, and ring fingers.  POSTOPERATIVE DIAGNOSIS:  Stenosing tenosynovitis, left index, middle, and ring fingers.  OPERATION:  Release A1 pulley, left index middle and ring fingers.  SURGEON:  Cindee Salt, MD  ANESTHESIA:  General with local infiltration.  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  She has had multiple injections done without relief.  She is elected to undergo surgical release.  Pre, peri, and postoperative course have been discussed along with the risks and complications.  She is aware that there is no guarantee with the surgery, possibility of infection; recurrence of injury to arteries, nerves, tendons, incomplete relief of symptoms, dystrophy.  In preoperative area, the patient was seen, the extremity marked by both patient and surgeon.  Antibiotic given.  PROCEDURE IN DETAIL:  The patient was brought to the operating room, where general anesthetic was carried out without difficulty under the direction of Dr. Sampson Goon.  She was prepped using ChloraPrep, supine position, left arm free.  A 3-minute dry time was allowed.  Time-out taken, confirming the patient and procedure.  The limb was exsanguinated with an Esmarch bandage.  Tourniquet placed on the upper arm, inflated to 250 mmHg.  The ring finger was attended to do first.  An oblique incision made, carried down through subcutaneous tissue.  Bleeders were electrocauterized with bipolar.  The dissection carried down,  protecting neurovascular bundles radial and ulnarly with retractors.  A1 pulley was then released on its radial aspect.  Small incision made centrally in A2.  Partial synovectomy performed proximally.  Finger placed through a full range motion, no further triggering was noted.  The middle finger was attended to do next.  Again oblique incision made over the A1 pulley area, carried down through subcutaneous tissue.  Retractors again placed protecting neurovascular bundles.  A release performed on its radial aspect to the A1 pulley.  A small incision made centrally in A2 and partial synovectomy performed proximally and the finger placed through a full range of motion, no further triggering noted.  Oblique incision was then made over the index finger carried down through subcutaneous tissue.  Bleeders again electrocauterized with bipolar.  Retractors placed protecting neurovascular structures.  An incision was made on the radial aspect of the A1 pulley.  A small incision made centrally in A2 and partial synovectomy performed proximally.  Finger placed through a full range motion, and no triggering was noted.  The wound was copiously irrigated with saline.  The skin was then closed with interrupted 5-0 Vicryl Rapide sutures, local infiltration with 0.25% Marcaine without epinephrine was given, approximately 9 mL was used.  Sterile compressive dressing with the fingers  free was applied.  On deflation of the tourniquet, all fingers immediately pinked.  She was taken to the recovery room for observation in satisfactory condition.  She will be discharged home to return in 1 week on Vicodin.          ______________________________ Cindee Salt, M.D.     GK/MEDQ  D:  01/02/2013  T:  01/03/2013  Job:  [id number]

## 2013-02-02 ENCOUNTER — Other Ambulatory Visit: Payer: Self-pay | Admitting: Neurology

## 2013-02-02 ENCOUNTER — Other Ambulatory Visit: Payer: Self-pay | Admitting: Gastroenterology

## 2013-02-05 ENCOUNTER — Encounter: Payer: Self-pay | Admitting: Gastroenterology

## 2013-02-05 ENCOUNTER — Ambulatory Visit (INDEPENDENT_AMBULATORY_CARE_PROVIDER_SITE_OTHER): Payer: PRIVATE HEALTH INSURANCE | Admitting: Gastroenterology

## 2013-02-05 ENCOUNTER — Other Ambulatory Visit (INDEPENDENT_AMBULATORY_CARE_PROVIDER_SITE_OTHER): Payer: PRIVATE HEALTH INSURANCE

## 2013-02-05 VITALS — BP 150/88 | HR 104 | Ht 64.0 in | Wt 220.4 lb

## 2013-02-05 DIAGNOSIS — R1013 Epigastric pain: Secondary | ICD-10-CM

## 2013-02-05 DIAGNOSIS — K625 Hemorrhage of anus and rectum: Secondary | ICD-10-CM

## 2013-02-05 DIAGNOSIS — G8929 Other chronic pain: Secondary | ICD-10-CM

## 2013-02-05 DIAGNOSIS — K3184 Gastroparesis: Secondary | ICD-10-CM | POA: Insufficient documentation

## 2013-02-05 LAB — CBC WITH DIFFERENTIAL/PLATELET
BASOS ABS: 0.1 10*3/uL (ref 0.0–0.1)
Basophils Relative: 0.9 % (ref 0.0–3.0)
Eosinophils Absolute: 0.1 10*3/uL (ref 0.0–0.7)
Eosinophils Relative: 0.5 % (ref 0.0–5.0)
HEMATOCRIT: 39 % (ref 36.0–46.0)
Hemoglobin: 12.3 g/dL (ref 12.0–15.0)
Lymphocytes Relative: 33 % (ref 12.0–46.0)
Lymphs Abs: 3.9 10*3/uL (ref 0.7–4.0)
MCHC: 31.6 g/dL (ref 30.0–36.0)
MCV: 86.8 fl (ref 78.0–100.0)
MONO ABS: 0.8 10*3/uL (ref 0.1–1.0)
Monocytes Relative: 6.6 % (ref 3.0–12.0)
Neutro Abs: 7 10*3/uL (ref 1.4–7.7)
Neutrophils Relative %: 59 % (ref 43.0–77.0)
PLATELETS: 317 10*3/uL (ref 150.0–400.0)
RBC: 4.49 Mil/uL (ref 3.87–5.11)
RDW: 14.9 % — AB (ref 11.5–14.6)
WBC: 11.9 10*3/uL — ABNORMAL HIGH (ref 4.5–10.5)

## 2013-02-05 MED ORDER — METHSCOPOLAMINE BROMIDE 5 MG PO TABS: ORAL_TABLET | ORAL | Status: DC

## 2013-02-05 MED ORDER — METOCLOPRAMIDE HCL 10 MG PO TABS: ORAL_TABLET | ORAL | Status: DC

## 2013-02-05 NOTE — Assessment & Plan Note (Signed)
Minimal bleeding, probably hemorrhoidal

## 2013-02-05 NOTE — Progress Notes (Signed)
          History of Present Illness:  [name] has returned for followup of abdominal pain.  When she ran out of Reglan and hyomax she develop recurrent postprandial abdominal pain.  She is afraid to E. to because of pain.  It is described as diffuse, severe pain across her upper abdomen.  Endoscopy demonstrated typical benign fundic appearing polyps.  Gastric emptying scan was pertinent for significant gastroparesis.  She was started on Reglan.  Bowel pain recur when she ran her medications.  Weight has been stable.  She probably underwent colonoscopy in 2011.  I do not see the report but pathology demonstrated some adenomatous polyps.  Family history pertinent for brother with colon cancer.  She continues to have occasional blood on the toilet tissue.    Review of Systems: Pertinent positive and negative review of systems were noted in the above HPI section. All other review of systems were otherwise negative.    Current Medications, Allergies, Past Medical History, Past Surgical History, Family History and Social History were reviewed in [place] record  Vital signs were reviewed in today's medical record. Physical Exam: General: Well developed , well nourished, no acute distress Skin: anicteric Head: Normocephalic and atraumatic Eyes:  sclerae anicteric, EOMI Ears: Normal auditory acuity Mouth: No deformity or lesions Lungs: Clear throughout to auscultation Heart: Regular rate and rhythm; no murmurs, rubs or bruits Abdomen: Soft, and non distended. No masses, hepatosplenomegaly or hernias noted. Normal Bowel sounds.  There is mild, diffuse tenderness across the upper abdomen in the midepigastrium, left and right upper quadrants Rectal:deferred Musculoskeletal: Symmetrical with no gross deformities  Pulses:  Normal pulses noted Extremities: No clubbing, cyanosis, edema or deformities noted Neurological: Alert oriented x 4, grossly  nonfocal Psychological:  Alert and cooperative. Normal mood and affect  See Assessment and Plan under Problem List

## 2013-02-05 NOTE — Patient Instructions (Addendum)
You have been scheduled for an MRI at [facility] on 02/13/2013. Your appointment time is 9am. Please arrive 15 minutes prior to your appointment time for registration purposes. There is no prep for this test. However, if you have any metal in your body, have a pacemaker or defibrillator, please be sure to let your ordering physician know. This test typically takes 45 minutes to 1 hour to complete.  Go to the basement for labs today

## 2013-02-05 NOTE — Assessment & Plan Note (Signed)
Patient continues to complain of severe postprandial abdominal pain although weight has remained stable.  Endoscopy was unrevealing.  She has gastroparesis by gastric emptying scan.  I have some concern for chronic mesenteric ischemia.  Pancreatic pain is less likely.  It is noteworthy that symptoms did improve with Reglan and hyomax.  Recommendations #1 renew medications #2 CT of the abdomen and pelvis #3 check amylase, lipase, LFTs and CBC

## 2013-02-05 NOTE — Assessment & Plan Note (Signed)
Plan to resume Reglan

## 2013-02-06 LAB — HEPATIC FUNCTION PANEL
ALBUMIN: 3.9 g/dL (ref 3.5–5.2)
ALT: 17 U/L (ref 0–35)
AST: 13 U/L (ref 0–37)
Alkaline Phosphatase: 63 U/L (ref 39–117)
Bilirubin, Direct: 0 mg/dL (ref 0.0–0.3)
TOTAL PROTEIN: 7.4 g/dL (ref 6.0–8.3)
Total Bilirubin: 0.3 mg/dL (ref 0.3–1.2)

## 2013-02-06 LAB — AMYLASE: Amylase: 116 U/L (ref 27–131)

## 2013-02-06 LAB — LIPASE: Lipase: 48 U/L (ref 11.0–59.0)

## 2013-02-07 ENCOUNTER — Encounter (INDEPENDENT_AMBULATORY_CARE_PROVIDER_SITE_OTHER): Payer: Self-pay

## 2013-02-07 ENCOUNTER — Ambulatory Visit (INDEPENDENT_AMBULATORY_CARE_PROVIDER_SITE_OTHER): Payer: Medicare Other | Admitting: Neurology

## 2013-02-07 ENCOUNTER — Encounter: Payer: Self-pay | Admitting: Neurology

## 2013-02-07 VITALS — BP 141/86 | HR 93 | Resp 18 | Ht 65.5 in | Wt 220.0 lb

## 2013-02-07 DIAGNOSIS — R519 Headache, unspecified: Secondary | ICD-10-CM | POA: Insufficient documentation

## 2013-02-07 DIAGNOSIS — R51 Headache: Secondary | ICD-10-CM

## 2013-02-07 DIAGNOSIS — R55 Syncope and collapse: Secondary | ICD-10-CM

## 2013-02-07 MED ORDER — AMITRIPTYLINE HCL 25 MG PO TABS: 25.0000 mg | ORAL_TABLET | Freq: Every day | ORAL | Status: DC

## 2013-02-07 NOTE — Patient Instructions (Signed)
Please contact your prescribing physician about tramadol, which should  not be taken with amitripyline .

## 2013-02-07 NOTE — Progress Notes (Signed)
Subjective:    Patient ID: [name] is a 68 y.o. female.  HPI  Interim history:   Towards that 3 months ago while in church she suffered a syncope spell. She describes her spells as follows she was standing at the portable for church, she started feeling lightheaded and her heart was beating fast , the warm begun to spin and she was able to tell her Delsa Bern that she probably will faint or pass out. When he guided her to the top of the 3 stair podium, she collapsed. She fainted , soon after she regained consciousness, finding herself at the bottom of the steps. She seems to have spells with LOC and a duration of  10-15 seconds, brief.  Her skin is clammy and she will be diaphoretic , nauseated and her chest tight. She never suffered a tongue bite , any kind of tonic-clonic activity or loss contour of bowel or bladder.  She further stated that her last spell in church was followed by a hyperventilation and  anxiety attack. [name] seemed to have felt that the patient had seizures, which the  patient strongly denies. The patient also had back surgery in 2013, has a long-standing history of migrainous headaches that have become more frequent, in spite of being on Topiramate, in the past her HA responded to amitryptiline.  She has a history of coronary artery disease and myocardial infarction in 98, asthma, gastroparesis, diabetes mellitus, BMI over 40, obstructive sleep apnea, she recently had an MRI of her spine, lumbar, with [name] but has not been told  the results yet.   [name] reports that  Amitryptiline was not longer covered by her [employer] and that this has caused her great distress- as her headaches were better controlled than she was able to use " elavil". Her fainting spells could be related to diabetic dysautonomia.       Last 2 visits with [name] and Dr Erling Cruz:   [name] is a 68 year old right-handed woman who presents for followup consultation of her  fainting episodes. She used to follow with Dr. Jeneen Rinks love and started having suspected fainting episodes in August 2000 age. She has an underlying medical history of obesity, asthma, hypertension, angina, diabetes, breast cancer, heart disease, hyperlipidemia, arthritis and obstructive sleep apnea. She was last seen by Dr. love in May 2013, at which time he felt that she was having dizzy episodes that were not consistent with seizures. He suggested a one-year followup. She is on Valium, Plavix, losartan, Restasis, hydrocodone as needed, Plavix, Lipitor, metformin, choric, amitriptyline, Topamax, Ambien.Workup in the past has included MRI of brain in September 2008 which was normal, MR a head in September 2008 which was normal, EEG in September 2008 which showed left frontotemporal sharp waves. She was started on Topamax in March 2009 and was admitted in January 2009 for syncope and atypical chest pain. He felt she had a Hx of Sz.   She is unaccompanied today and reports that she had 2 passing out spells in the past year, one episode was 3 months ago at church, when she went to the bathroom and then she had another episode at home. She lives alone. She has sleep apnea, but does not use a CPAP machine, she uses oxygen at night. She has an aid 4 days a week for 2:15 hours each time. She has several church friends that have keys to her apartment. Her son lives in [place] and her  daughter lives in [place]. She denies any recent seizure like episodes. She has LOC usually for 2-5 minutes. No Hx of tongue bite or bladder incontinence. She had back surgery last year. She has a longstanding Hx of migraines and has had more HAs.   Her Past Medical History Is Significant For: Past Medical History  Diagnosis Date  . Myocardial infarction 1992 & 1998    NM Myocar Multi W/Spect W/Wall Motion EF on 08/07/10 - IMPRESSION: 1) No evidence of ischemia.  2) Stable focal infarct involving the apicoseptal wall when compared to prior  exams.  3) Normal LV wall motion.  4) Estimated Q G S EF 70%, unchanged.  Marland Kitchen Dysrhythmia     arrythmia and spasms occ none for 8-9 months drf frasier  seeing me while dr sprull out with back  . Angina     none recently  . Asthma   . Blood transfusion   . GERD (gastroesophageal reflux disease)   . Hiatal hernia   . Other forms of migraine, without mention of intractable migraine without mention of status migrainosus   . Chest pain   . Dry eye     Chronic  . Hypercholesteremia   . Lumbago   . Lumbar radiculopathy   . Spondylolisthesis   . Diverticulosis of colon (without mention of hemorrhage)     Colonoscopy 11/09/09 - FINDINGS: 1) 5mm polp in transverse colon, resected & retrieved.  2) Diverticulosis in sigmoid & descending colon.  3) Medium-sized, internal hemorrhoids.  . Internal hemorrhoids without mention of complication     Colonoscopy 11/09/09 - FINDINGS: 1) 79mm polp in transverse colon, resected & retrieved.  2) Diverticulosis in sigmoid & descending colon.  3) Medium-sized, internal hemorrhoids.  . Benign neoplasm of colon 11/09/09    Colonoscopy 11/09/09 - FINDINGS: 1) 22mm polp in transverse colon, resected & retrieved.  2) Diverticulosis in sigmoid & descending colon.  3) Medium-sized, internal hemorrhoids.  . Hemorrhage of rectum and anus     Colonoscopy 11/09/09 - FINDINGS: 1) 72mm polp in transverse colon, resected & retrieved.  2) Diverticulosis in sigmoid & descending colon.  3) Medium-sized, internal hemorrhoids.  . Status post placement of implantable loop recorder 11/15/06    Medtronic -Model # 9528 -Seriel # [id number]  . Essential hypertension, benign   . Esophagitis   . SOB (shortness of breath)     At rest & on exertion  . CMC arthritis     Left middle finger  . Depression     40+ years  . Diabetes mellitus 10/2008    type 2  . Breast cancer 1989    Left breast cancer  . Obese   . Syncope     ILR impanted 11/15/06 Medtronic -Model # 9528 -Seriel #  [id number]  . Facet arthropathy, lumbar     L3-4  . Preoperative clearance     NM Myocar Multi W/Spect W/Wall Motion EF 08/07/10  IMPRESSION: 1) No evidence of ischemia.  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  3) Normal LV wall motion.  4) Estimated Q G S EF 70%, unchanged.  . Obstructive sleep apnea     uses oxygen at night  . On supplemental oxygen therapy     at night-did not need a cpap   Her Past Surgical History Is Significant For: Past Surgical History  Procedure Laterality Date  . Abdominal hysterectomy  1978  . Joint replacement      lsft  hip x2, rt knee  . Knee arthroscopy Right 2002    By Dr. Lorin Mercy  . Foot surgery Bilateral     Bil feet great toes and second toes spurs. By Dr. Milinda Pointer in 2004.  . Breast biopsy Left 1995    [facility]opsy at [facility] ([facility])  . Tonsillectomy    . Back surgery      x2 one for infection  . Cholecystectomy    . Total hip revision  12/14/2010    Procedure: TOTAL HIP REVISION;  Surgeon: Marybelle Killings;  Location: [facility];  Service: Orthopedics;  Laterality: Left;  Left Total Hip Arthroplasty Revision, Poly and Ball Exchange, Possible Acetabular Revision vs. Poly Exchange  . Colonoscopy w/ polypectomy  11/09/09    FINDINGS: 1) 2mm polp in transverse colon, resected & retrieved.  2) Diverticulosis in sigmoid & descending colon.  3) Medium-sized, internal hemorrhoids.  . Loop recorder implant  11/15/06    For recurring syncope. Medtronic -Model # 9528 -Seriel # [id number]  . Hemorrhoid surgery  2011    Internal & external hemorrhoidectomy (x) 3 quadrants  . Esophagogastroduodenoscopy  02/15/07  . Dilation and curettage of uterus    . Trigger finger release Left 01/02/2013    Procedure: RELEASE LEFT MIDDLE TRIGGER FINGER/LEFT INDEX AND LEFT RING FINGER;  Surgeon: Wynonia Sours, MD;  Location: [facility];  Service: Orthopedics;  Laterality: Left;  ANESTHESIA: GENERAL/IV REGIONAL FAB    Her Family History Is Significant For: Family History  Problem Relation Age of Onset  . Pancreatic cancer Mother   . Heart attack Father   . Diabetes Son   . Arthritis Brother     Severe/Crippling  . Tuberculosis Brother   . Hypertension Brother     x 2  . Heart disease Sister   . Diabetes Brother   . Seizures Brother   . Autoimmune disease Brother     AIDS  . Colon cancer Brother   . Lung cancer Brother     mets    Her Social History Is Significant For: History   Social History  . Marital Status: Single    Spouse Name: N/A    Number of Children: 2  . Years of Education: college   Occupational History  . Retired    Social History Main Topics  . Smoking status: Former Smoker    Types: Cigarettes    Quit date: 01/19/1983  . Smokeless tobacco: Never Used  . Alcohol Use: No  . Drug Use: No  . Sexual Activity: None   Other Topics Concern  . None   Social History Narrative   Patient is single and lives alone.   Patient has two children.   Patient is retired.   Patient has a college education.   Patient is right-handed.   Patient does not drink any caffeine.    Her Allergies Are:  Allergies  Allergen Reactions  . Contrast Media [Iodinated Diagnostic Agents] Anaphylaxis  . Gadolinium Anaphylaxis       . Aspirin Nausea And Vomiting  . Iohexol Other (See Comments)    unknown  :  Her Current Medications Are:  Outpatient Encounter Prescriptions as of 02/07/2013  Medication Sig  . alendronate (FOSAMAX) 70 MG tablet Take 70 mg by mouth every 7 (seven) days.   Marland Kitchen atorvastatin (LIPITOR) 10 MG tablet Take 10 mg by mouth daily.    . clopidogrel (PLAVIX) 75 MG tablet Take 75 mg by mouth daily.   . cycloSPORINE (  RESTASIS) 0.05 % ophthalmic emulsion Place 1 drop into both eyes 2 (two) times daily.    Marland Kitchen DEXILANT 60 MG capsule 1 capsule daily.  . diazepam (VALIUM) 5 MG tablet Take 5 mg by mouth every 6 (six) hours as needed. For muscle spasms  . diltiazem  (CARDIZEM CD) 180 MG 24 hr capsule Take 1 capsule (180 mg total) by mouth daily.  Marland Kitchen docusate sodium (COLACE) 100 MG capsule Take 100 mg by mouth daily.  . DULoxetine (CYMBALTA) 30 MG capsule Take 30 mg by mouth daily.   Marland Kitchen glimepiride (AMARYL) 2 MG tablet Take 1 tablet (2 mg total) by mouth 2 (two) times daily.  Marland Kitchen HYDROcodone-acetaminophen (NORCO) 10-325 MG per tablet Take by mouth as needed.   Marland Kitchen HYDROcodone-acetaminophen (NORCO) 5-325 MG per tablet Take 1 tablet by mouth every 6 (six) hours as needed for moderate pain.  . hyoscyamine (LEVBID) 0.375 MG 12 hr tablet TAKE 1 TABLET BY MOUTH TWICE DAILY FOR ABDOMINAL PAIN  . isosorbide mononitrate (IMDUR) 60 MG 24 hr tablet Take 60 mg by mouth 2 (two) times daily.   Marland Kitchen losartan (COZAAR) 100 MG tablet Take 100 mg by mouth daily.   . metFORMIN (GLUCOPHAGE) 1000 MG tablet Take 1 tablet (1,000 mg total) by mouth 2 (two) times daily with a meal.  . methscopolamine (PAMINE FORTE) 5 MG tablet Take one tab twice a day as needed abdominal pain  . metoCLOPramide (REGLAN) 10 MG tablet Take one tab before each meal and at bedtime  . nitroGLYCERIN (NITROLINGUAL) 0.4 MG/SPRAY spray Place 1 spray under the tongue every 5 (five) minutes as needed. Chest pains    . Olopatadine HCl (PATADAY) 0.2 % SOLN Place 1 drop into both eyes daily.    . pantoprazole (PROTONIX) 40 MG tablet   . PROAIR HFA 108 (90 BASE) MCG/ACT inhaler   . saxagliptin HCl (ONGLYZA) 5 MG TABS tablet Take 5 mg by mouth daily.   Marland Kitchen topiramate (TOPAMAX) 100 MG tablet Take 1 tablet (100 mg total) by mouth every evening.  . topiramate (TOPAMAX) 25 MG tablet TAKE 1 TABLET BY MOUTH EVERY MORNING.  . traMADol (ULTRAM) 50 MG tablet Take 50 mg by mouth every 6 (six) hours as needed.   . zolpidem (AMBIEN) 10 MG tablet Take 10 mg by mouth at bedtime as needed.   :  Review of Systems   The patient endorsed a geriatric depression scale at 9 points there was no sleepiness assessment given tremor or fatigue  assessment there is no headache diary provided. Review of systems is widely endures before fatigue, neck stiffness, hearing loss, ear pain, shortness of breath and chest tightness, I pain, light sensitivity and itching of the eye, chest pain palpitations, abdominal pain, rectal bleeding and black stools, constipation and nausea, insomnia, daytime sleepiness, anemia, dizziness, headache, passing out spells and depression.    General: The patient is awake, alert and appears not in acute distress. The patient is well groomed. Head: Normocephalic, atraumatic. Neck is supple. Mallampati - I was unable to see a uvula. , neck circumference: 16  Cardiovascular:  Regular rate and rhythm, without  murmurs or carotid bruit, and without distended neck veins. Respiratory: Lungs are clear to auscultation. Skin:  Without evidence of edema, or rash Trunk: BMI is morbidly elevated and patient  has normal posture.   Neurologic exam : The patient is awake and alert, oriented to place and time.  Memory subjective  described as intact. There is a normal attention span &  concentration ability.  Speech is fluent without dysarthria, dysphonia or aphasia. Mood and affect are depressed, pleating for pain medications.  Cranial nerves: Pupils are equal and reactive to light. Funduscopic exam without evidence of pallor or edema, beginnin glaucoma suspected in both eyes. .  Extraocular movements  in vertical and horizontal planes intact and without nystagmus. Visual fields by finger perimetry are intact. Hearing to finger rub reduced right over left.  Facial sensation intact to fine touch.  Facial motor strength is symmetric and tongue and uvula move midline.  Motor exam:   Normal tone and muscle bulk in all extremities. The patient reports increasing stiffness in both legs .  Sensory:  Fine touch, pinprick and vibration were tested in all extremities. Proprioception is tested in the upper extremities only. This was  normal.  Coordination: Rapid alternating movements in the fingers/hands is tested and normal. Finger-to-nose maneuver tested  without evidence of ataxia, or tremor.  Dysmetria on the left, but patient wears a brace.  The patient cannot perform a heel-to-shin maneuver there is pain with flexion as well as with the rotation necessary to perform this. She reports that the stiffness in her legs has been present for a good while. She had some improvement after being given steroid injections into her back she reports.  Gait and station: Patient walks without assistive device.Stance is wider based than  normal. Tandem gait performed with assistance - the patient placed the lateral side of her foot  first . Astasia/ abasia.  Romberg testing is normal.  Deep tendon reflexes: in the  upper and lower extremities are symmetric and intact. Babinski maneuver response is equivocal..   Assessment:  After physical and neurologic examination, review of laboratory studies, imaging, neurophysiology testing and pre-existing records;  [name] chief complaint listed as seizures is not what she came to see me for. . 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 She has seen a cardiologist Dr. Lovena Le.  The spell she has described  Included symptoms that the pattern of a brief vertigo , followed by collapse,  not  a sense of doom. sjhe deno ies ever having felt a wave rising from her stomach , or deja vu. no tonic extension, no staring attacks. There has never been a  post ictal component. During her hospitalization in 2008 an EEG was obtained,  which showed left frontal temporal sharp waves.  I would like to repeat this EEG.   In addition her main concern today is PAIN,  Since neurology does not treat joint pain, or back pain,  orthopedic pain or head pain, I will leave this to her primary physician's orthopedic and neurosurgeon.   2)She does have at least 4 or severe migraines a month she describes  amongst other type of headaches that seem not to be quite as severe. Interestingly she felt that amitriptyline was able to prevent some of these spells and that she has had an increasing frequency of migraine is the bands since this medication was longer available to her. She states also that she fell topiramate had not made a difference for her headaches, nor for spells.  Plan:  Treatment plan and additional workup will be reviewed under Problem List.  [name] felt a strong correlation between resurgence of her migraine headaches and the discontinuation of amitriptyline I would like to reinstate this medication 4. Her amitriptyline 50 mg by mouth at bedtime was discontinued in October she states I would like to reintroduce the  medication today. Since the patient is a history of morbid obesity, diabetes mellitus, and obstructive sleep apnea it is critical that she is not taking respiratory suppressants, sedative medication. Valium was prescribed by [name] she states for muscle spasms she has to be very careful with this medication not to enter was respiratory drive reduction. The same for the Hydrocort on oxycodone. To a lesser degree the same for Ambien.   I have not changed, refilled or altered  any of the medications - again the patient is on Topiramate at this time but feels it has not given her the relief she needs. She should be able to wean gradually off.    I suggested no follow up in neurology , except for migraine treatment.  I answered all her questions today and the patient was in agreement with the above outlined plan.

## 2013-02-13 ENCOUNTER — Ambulatory Visit (HOSPITAL_COMMUNITY): Admission: RE | Admit: 2013-02-13 | Payer: PRIVATE HEALTH INSURANCE | Source: Ambulatory Visit

## 2013-02-13 ENCOUNTER — Telehealth: Payer: Self-pay | Admitting: Gastroenterology

## 2013-02-13 NOTE — Telephone Encounter (Signed)
Pt was seen 02/05/13. Per recommendation pt was to have CT of abdomen and pelvis. Pt was scheduled for MRI of abdomen without contrast due to a contrast allergy. Insurance has denied this MRI. Do you want to appeal this for the pt? Please advise.

## 2013-02-13 NOTE — Telephone Encounter (Signed)
[name] in precert aware that Dr. Deatra Ina wants to appeal the denial for the MRI. Pt aware.

## 2013-02-13 NOTE — Telephone Encounter (Signed)
We need to appeal because I am concerned about chronic mesenteric ischemia.  We need to visualize the large vessels in the abdomen which cannot be done with a CT without IV contrast.  Hence, she will require an MRI

## 2013-02-27 ENCOUNTER — Ambulatory Visit (INDEPENDENT_AMBULATORY_CARE_PROVIDER_SITE_OTHER): Payer: Medicare Other

## 2013-02-27 ENCOUNTER — Encounter: Payer: Self-pay | Admitting: Podiatry

## 2013-02-27 ENCOUNTER — Ambulatory Visit (INDEPENDENT_AMBULATORY_CARE_PROVIDER_SITE_OTHER): Payer: Medicare Other | Admitting: Podiatry

## 2013-02-27 VITALS — BP 127/84 | HR 104 | Resp 16

## 2013-02-27 DIAGNOSIS — B351 Tinea unguium: Secondary | ICD-10-CM

## 2013-02-27 DIAGNOSIS — S93409A Sprain of unspecified ligament of unspecified ankle, initial encounter: Secondary | ICD-10-CM

## 2013-02-27 DIAGNOSIS — M79609 Pain in unspecified limb: Secondary | ICD-10-CM

## 2013-02-27 DIAGNOSIS — E1149 Type 2 diabetes mellitus with other diabetic neurological complication: Secondary | ICD-10-CM

## 2013-02-27 DIAGNOSIS — M25579 Pain in unspecified ankle and joints of unspecified foot: Secondary | ICD-10-CM

## 2013-02-27 NOTE — Progress Notes (Signed)
Trim toenails and check my ankle i fell and it has been swollen and hurts  Objective: Vital signs are stable she is alert and oriented x3. Pulses are strongly palpable bilateral. Her nails are thick yellow dystrophic clinically mycotic and painful palpation. She has pain on palpation to the anterolateral aspect of the right ankle. She also has pain on palpation to the medial malleolar region of the right ankle. Radiographs taken today demonstrate some osteoarthritic changes of the medial ankle mortise but no other osseous abnormalities in this area.  Assessment: Pain in limb secondary to onychomycosis 1 through 5 bilateral. Sprain ankle right.  Plan: Placed her in a tri-lock brace right ankle. And debridement her nails 1 through 5 bilateral is cover service secondary to pain.

## 2013-02-28 NOTE — Telephone Encounter (Signed)
Pt scheduled for MRI of abdomen without contrast at [facility] 03/09/13, pt to arrive there at 9:45am for a 10am appt. No prep needed. Left message for pt to call back.

## 2013-03-02 NOTE — Telephone Encounter (Signed)
Pt aware of MRI appt. Pt states her Reglan script is going to cost her 40.00/month and she wants to know if Dr. Deatra Ina could give her something else that may be cheaper. Please advise.

## 2013-03-05 ENCOUNTER — Other Ambulatory Visit: Payer: Self-pay | Admitting: Neurology

## 2013-03-05 MED ORDER — ERYTHROMYCIN BASE 250 MG PO TABS: 250.0000 mg | ORAL_TABLET | Freq: Four times a day (QID) | ORAL | Status: DC

## 2013-03-05 NOTE — Telephone Encounter (Signed)
We can try erythromycin 250 mg 4 times a day

## 2013-03-05 NOTE — Telephone Encounter (Signed)
I called and spoke with the patient who said she is still taking both strengths of Topamax

## 2013-03-05 NOTE — Telephone Encounter (Signed)
Pt aware and script sent to the pharmacy. 

## 2013-03-09 ENCOUNTER — Telehealth: Payer: Self-pay

## 2013-03-09 ENCOUNTER — Ambulatory Visit (HOSPITAL_COMMUNITY): Admission: RE | Admit: 2013-03-09 | Payer: PRIVATE HEALTH INSURANCE | Source: Ambulatory Visit

## 2013-03-09 NOTE — Telephone Encounter (Signed)
Pt went for MR today at [facility] and was told it was not approved by her INS co. Spoke with pt and there was a problem with an approval number that showed on [facility] website but they state it was not approved. Pt will have to be rescheduled when approved. Spoke with pt and let her know we will contact her next week regarding this MR.

## 2013-03-21 ENCOUNTER — Emergency Department (HOSPITAL_COMMUNITY): Payer: PRIVATE HEALTH INSURANCE

## 2013-03-21 ENCOUNTER — Emergency Department (HOSPITAL_COMMUNITY)
Admission: EM | Admit: 2013-03-21 | Discharge: 2013-03-22 | Disposition: A | Discharge status: Home / Self Care | Payer: PRIVATE HEALTH INSURANCE | Attending: Emergency Medicine | Admitting: Emergency Medicine

## 2013-03-21 ENCOUNTER — Encounter (HOSPITAL_COMMUNITY): Payer: Self-pay | Admitting: Emergency Medicine

## 2013-03-21 DIAGNOSIS — I252 Old myocardial infarction: Secondary | ICD-10-CM | POA: Insufficient documentation

## 2013-03-21 DIAGNOSIS — R0789 Other chest pain: Secondary | ICD-10-CM | POA: Insufficient documentation

## 2013-03-21 DIAGNOSIS — E119 Type 2 diabetes mellitus without complications: Secondary | ICD-10-CM | POA: Insufficient documentation

## 2013-03-21 DIAGNOSIS — K219 Gastro-esophageal reflux disease without esophagitis: Secondary | ICD-10-CM | POA: Insufficient documentation

## 2013-03-21 DIAGNOSIS — Z79899 Other long term (current) drug therapy: Secondary | ICD-10-CM | POA: Insufficient documentation

## 2013-03-21 DIAGNOSIS — G43909 Migraine, unspecified, not intractable, without status migrainosus: Secondary | ICD-10-CM | POA: Insufficient documentation

## 2013-03-21 DIAGNOSIS — Z9981 Dependence on supplemental oxygen: Secondary | ICD-10-CM | POA: Insufficient documentation

## 2013-03-21 DIAGNOSIS — J45909 Unspecified asthma, uncomplicated: Secondary | ICD-10-CM | POA: Insufficient documentation

## 2013-03-21 DIAGNOSIS — Z7902 Long term (current) use of antithrombotics/antiplatelets: Secondary | ICD-10-CM | POA: Insufficient documentation

## 2013-03-21 DIAGNOSIS — I10 Essential (primary) hypertension: Secondary | ICD-10-CM | POA: Insufficient documentation

## 2013-03-21 DIAGNOSIS — E7800 Pure hypercholesterolemia, unspecified: Secondary | ICD-10-CM | POA: Insufficient documentation

## 2013-03-21 DIAGNOSIS — R110 Nausea: Secondary | ICD-10-CM | POA: Insufficient documentation

## 2013-03-21 DIAGNOSIS — Z87891 Personal history of nicotine dependence: Secondary | ICD-10-CM | POA: Insufficient documentation

## 2013-03-21 DIAGNOSIS — G4733 Obstructive sleep apnea (adult) (pediatric): Secondary | ICD-10-CM | POA: Insufficient documentation

## 2013-03-21 DIAGNOSIS — Z8739 Personal history of other diseases of the musculoskeletal system and connective tissue: Secondary | ICD-10-CM | POA: Insufficient documentation

## 2013-03-21 DIAGNOSIS — R079 Chest pain, unspecified: Secondary | ICD-10-CM

## 2013-03-21 DIAGNOSIS — I209 Angina pectoris, unspecified: Secondary | ICD-10-CM | POA: Insufficient documentation

## 2013-03-21 DIAGNOSIS — E669 Obesity, unspecified: Secondary | ICD-10-CM | POA: Insufficient documentation

## 2013-03-21 DIAGNOSIS — Z853 Personal history of malignant neoplasm of breast: Secondary | ICD-10-CM | POA: Insufficient documentation

## 2013-03-21 LAB — CBC
HCT: 38 % (ref 36.0–46.0)
HEMOGLOBIN: 12 g/dL (ref 12.0–15.0)
MCH: 27.3 pg (ref 26.0–34.0)
MCHC: 31.6 g/dL (ref 30.0–36.0)
MCV: 86.6 fL (ref 78.0–100.0)
Platelets: 347 10*3/uL (ref 150–400)
RBC: 4.39 MIL/uL (ref 3.87–5.11)
RDW: 15.1 % (ref 11.5–15.5)
WBC: 12.9 10*3/uL — ABNORMAL HIGH (ref 4.0–10.5)

## 2013-03-21 LAB — BASIC METABOLIC PANEL
BUN: 9 mg/dL (ref 6–23)
CO2: 23 meq/L (ref 19–32)
Calcium: 9.4 mg/dL (ref 8.4–10.5)
Chloride: 105 mEq/L (ref 96–112)
Creatinine, Ser: 0.87 mg/dL (ref 0.50–1.10)
GFR calc Af Amer: 79 mL/min — ABNORMAL LOW (ref >90)
GFR, EST NON AFRICAN AMERICAN: 68 mL/min — AB (ref >90)
Glucose, Bld: 122 mg/dL — ABNORMAL HIGH (ref 70–99)
POTASSIUM: 4.2 meq/L (ref 3.7–5.3)
SODIUM: 143 meq/L (ref 137–147)

## 2013-03-21 LAB — PROTIME-INR
INR: 0.93 (ref 0.00–1.49)
PROTHROMBIN TIME: 12.3 s (ref 11.6–15.2)

## 2013-03-21 LAB — I-STAT TROPONIN, ED: Troponin i, poc: 0 ng/mL (ref 0.00–0.08)

## 2013-03-21 LAB — TROPONIN I: Troponin I: <0.3 ng/mL (ref <0.30)

## 2013-03-21 MED ORDER — MORPHINE SULFATE 4 MG/ML IJ SOLN
4.0000 mg | Freq: Once | INTRAMUSCULAR | Status: AC
  Administered 2013-03-21: 4 mg via INTRAVENOUS
  Filled 2013-03-21: qty 1

## 2013-03-21 MED ORDER — KETOROLAC TROMETHAMINE 30 MG/ML IJ SOLN
30.0000 mg | Freq: Once | INTRAMUSCULAR | Status: DC
  Filled 2013-03-21: qty 1

## 2013-03-21 MED ORDER — DIAZEPAM 5 MG PO TABS
5.0000 mg | ORAL_TABLET | Freq: Once | ORAL | Status: AC
  Administered 2013-03-21: 5 mg via ORAL
  Filled 2013-03-21: qty 1

## 2013-03-21 NOTE — ED Notes (Signed)
PT ALL to ASA

## 2013-03-21 NOTE — ED Notes (Signed)
Presents with left sided chest with radiation to left arm and left face began at 12:30 while sitting at bible study, took 3 sprays of Nitro with relief, pain started again at 4 pm while sitting at home associated with nausea and discomfort when breathing. Pain is described as pins and needles-numbing/ HX of 5 MIs and "arterial spasms" pain rated 10/10 at this time. Pt is alert and oriented. Last used nitro at 6:30 pm. Unable to take nitro tablets. BP 144/88

## 2013-03-21 NOTE — ED Notes (Signed)
Patient refused Toradol  States she has a reaction to ASA.

## 2013-03-22 MED ORDER — HYDROCODONE-ACETAMINOPHEN 5-325 MG PO TABS: 1.0000 | ORAL_TABLET | ORAL | Status: DC | PRN

## 2013-03-22 NOTE — ED Provider Notes (Signed)
CSN: [id number]     Arrival date & time 03/21/13  1907 History   First MD Initiated Contact with Patient 03/21/13 2111     Chief Complaint  Patient presents with  . Chest Pain     HPI Patient reports ongoing chest pain since approximately 12:30 today.  She states is slightly worse and around 4 PM.  She's had nausea.  She describes this sensation as a pins and needles sensation in the left side of her chest.  She's had some radiation to her left arm.  She does have a history of MI but had a reassuring nuclear stress test in October 2014.  She's been diagnosed with coronary artery spasm before in the past.  She states she's tried nitroglycerin today without improvement in her symptoms.  She denies shortness of breath.  She saw some discomfort at this time.   Past Medical History  Diagnosis Date  . Myocardial infarction 1992 & 1998    NM Myocar Multi W/Spect W/Wall Motion EF on 08/07/10 - IMPRESSION: 1) No evidence of ischemia.  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  3) Normal LV wall motion.  4) Estimated Q G S EF 70%, unchanged.  Marland Kitchen Dysrhythmia     arrythmia and spasms occ none for 8-9 months drf frasier  seeing me while dr sprull out with back  . Angina     none recently  . Asthma   . Blood transfusion   . GERD (gastroesophageal reflux disease)   . Hiatal hernia   . Other forms of migraine, without mention of intractable migraine without mention of status migrainosus   . Chest pain   . Dry eye     Chronic  . Hypercholesteremia   . Lumbago   . Lumbar radiculopathy   . Spondylolisthesis   . Diverticulosis of colon (without mention of hemorrhage)     Colonoscopy 11/09/09 - FINDINGS: 1) 58mm polp in transverse colon, resected & retrieved.  2) Diverticulosis in sigmoid & descending colon.  3) Medium-sized, internal hemorrhoids.  . Internal hemorrhoids without mention of complication     Colonoscopy 11/09/09 - FINDINGS: 1) 66mm polp in  transverse colon, resected & retrieved.  2) Diverticulosis in sigmoid & descending colon.  3) Medium-sized, internal hemorrhoids.  . Benign neoplasm of colon 11/09/09    Colonoscopy 11/09/09 - FINDINGS: 1) 32mm polp in transverse colon, resected & retrieved.  2) Diverticulosis in sigmoid & descending colon.  3) Medium-sized, internal hemorrhoids.  . Hemorrhage of rectum and anus     Colonoscopy 11/09/09 - FINDINGS: 1) 60mm polp in transverse colon, resected & retrieved.  2) Diverticulosis in sigmoid & descending colon.  3) Medium-sized, internal hemorrhoids.  . Status post placement of implantable loop recorder 11/15/06    Medtronic -Model # 9528 -Seriel # [id number] H  . Essential hypertension, benign   . Esophagitis   . SOB (shortness of breath)     At rest & on exertion  . CMC arthritis     Left middle finger  . Depression     40+ years  . Diabetes mellitus 10/2008    type 2  . Breast cancer 1989    Left breast cancer  . Obese   . Syncope     ILR impanted 11/15/06 Medtronic -Model # 9528 -Seriel # [id number] H  . Facet arthropathy, lumbar     L3-4  . Preoperative clearance     NM Myocar Multi W/Spect W/Wall Motion EF 08/07/10  IMPRESSION: 1) No evidence of ischemia.  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  3) Normal LV wall motion.  4) Estimated Q G S EF 70%, unchanged.  . Obstructive sleep apnea     uses oxygen at night  . On supplemental oxygen therapy     at night-did not need a cpap   Past Surgical History  Procedure Laterality Date  . Abdominal hysterectomy  1978  . Joint replacement      lsft hip x2, rt knee  . Knee arthroscopy Right 2002    By Dr. Lorin Mercy  . Foot surgery Bilateral     Bil feet great toes and second toes spurs. By Dr. Milinda Pointer in 2004.  . Breast biopsy Left 1995    [facility]opsy at [facility] ([facility])  . Tonsillectomy    . Back surgery      x2 one for infection  . Cholecystectomy    . Total hip  revision  12/14/2010    Procedure: TOTAL HIP REVISION;  Surgeon: Marybelle Killings;  Location: [place];  Service: Orthopedics;  Laterality: Left;  Left Total Hip Arthroplasty Revision, Poly and Ball Exchange, Possible Acetabular Revision vs. Poly Exchange  . Colonoscopy w/ polypectomy  11/09/09    FINDINGS: 1) 71mm polp in transverse colon, resected & retrieved.  2) Diverticulosis in sigmoid & descending colon.  3) Medium-sized, internal hemorrhoids.  . Loop recorder implant  11/15/06    For recurring syncope. Medtronic -Model # 9528 -Seriel # [id number] H  . Hemorrhoid surgery  2011    Internal & external hemorrhoidectomy (x) 3 quadrants  . Esophagogastroduodenoscopy  02/15/07  . Dilation and curettage of uterus    . Trigger finger release Left 01/02/2013    Procedure: RELEASE LEFT MIDDLE TRIGGER FINGER/LEFT INDEX AND LEFT RING FINGER;  Surgeon: Wynonia Sours, MD;  Location: [place];  Service: Orthopedics;  Laterality: Left;  ANESTHESIA: GENERAL/IV REGIONAL FAB   Family History  Problem Relation Age of Onset  . Pancreatic cancer Mother   . Heart attack Father   . Diabetes Son   . Arthritis Brother     Severe/Crippling  . Tuberculosis Brother   . Hypertension Brother     x 2  . Heart disease Sister   . Diabetes Brother   . Seizures Brother   . Autoimmune disease Brother     AIDS  . Colon cancer Brother   . Lung cancer Brother     mets   History  Substance Use Topics  . Smoking status: Former Smoker    Types: Cigarettes    Quit date: 01/19/1983  . Smokeless tobacco: Never Used  . Alcohol Use: No   OB History   Grav Para Term Preterm Abortions TAB SAB Ect Mult Living                 Review of Systems  All other systems reviewed and are negative.      Allergies  Contrast media; Gadolinium; Aspirin; and Iohexol  Home Medications   Current Outpatient Rx  Name  Route  Sig  Dispense  Refill  . alendronate (FOSAMAX) 70 MG tablet   Oral   Take 70 mg by mouth  every 7 (seven) days.          Marland Kitchen amitriptyline (ELAVIL) 25 MG tablet   Oral   Take 1 tablet (25 mg total) by mouth at bedtime.   90 tablet   3   .  atorvastatin (LIPITOR) 10 MG tablet   Oral   Take 10 mg by mouth daily.           . calcium carbonate (TUMS - DOSED IN MG ELEMENTAL CALCIUM) 500 MG chewable tablet   Oral   Chew 2 tablets by mouth daily as needed for indigestion or heartburn.         . clopidogrel (PLAVIX) 75 MG tablet   Oral   Take 75 mg by mouth daily.          . cycloSPORINE (RESTASIS) 0.05 % ophthalmic emulsion   Both Eyes   Place 1 drop into both eyes 2 (two) times daily.           Marland Kitchen DEXILANT 60 MG capsule      1 capsule daily.         . diazepam (VALIUM) 5 MG tablet   Oral   Take 5 mg by mouth every 6 (six) hours as needed. For muscle spasms         . diltiazem (CARDIZEM CD) 180 MG 24 hr capsule   Oral   Take 1 capsule (180 mg total) by mouth daily.   90 capsule   3   . erythromycin (E-MYCIN) 250 MG tablet   Oral   Take 1 tablet (250 mg total) by mouth 4 (four) times daily.   120 tablet   0   . glimepiride (AMARYL) 2 MG tablet   Oral   Take 1 tablet (2 mg total) by mouth 2 (two) times daily.   30 tablet   0   . hyoscyamine (LEVBID) 0.375 MG 12 hr tablet      TAKE 1 TABLET BY MOUTH TWICE DAILY FOR ABDOMINAL PAIN   60 tablet   3   . isosorbide mononitrate (IMDUR) 60 MG 24 hr tablet   Oral   Take 60 mg by mouth 2 (two) times daily.          Marland Kitchen losartan (COZAAR) 100 MG tablet   Oral   Take 100 mg by mouth daily.          . metFORMIN (GLUCOPHAGE) 1000 MG tablet   Oral   Take 1 tablet (1,000 mg total) by mouth 2 (two) times daily with a meal.   30 tablet   0   . metoCLOPramide (REGLAN) 10 MG tablet      Take one tab before each meal and at bedtime   60 tablet   3   . nitroGLYCERIN (NITROLINGUAL) 0.4 MG/SPRAY spray   Sublingual   Place 1 spray under the tongue every 5 (five) minutes as needed. Chest pains            . Olopatadine HCl (PATADAY) 0.2 % SOLN   Both Eyes   Place 1 drop into both eyes daily.           . pantoprazole (PROTONIX) 40 MG tablet               . PROAIR HFA 108 (90 BASE) MCG/ACT inhaler               . saxagliptin HCl (ONGLYZA) 5 MG TABS tablet   Oral   Take 5 mg by mouth daily.          Marland Kitchen topiramate (TOPAMAX) 100 MG tablet      TAKE 1 TABLET BY MOUTH EVERY EVENING.   90 tablet   1   . topiramate (TOPAMAX) 25 MG tablet  TAKE 1 TABLET BY MOUTH EVERY MORNING.   90 tablet   1   . traMADol (ULTRAM) 50 MG tablet   Oral   Take 50 mg by mouth every 6 (six) hours as needed.          . zolpidem (AMBIEN) 10 MG tablet   Oral   Take 10 mg by mouth at bedtime as needed.           BP 125/85  Pulse 82  Temp(Src) 98.5 F (36.9 C) (Oral)  Resp 22  Wt 220 lb (99.791 kg)  SpO2 100% Physical Exam  Nursing note and vitals reviewed. Constitutional: She is oriented to person, place, and time. She appears well-developed and well-nourished. No distress.  HENT:  Head: Normocephalic and atraumatic.  Eyes: EOM are normal.  Neck: Normal range of motion.  Cardiovascular: Normal rate, regular rhythm and normal heart sounds.   Pulmonary/Chest: Effort normal and breath sounds normal.  Abdominal: Soft. She exhibits no distension. There is no tenderness.  Musculoskeletal: Normal range of motion.  Neurological: She is alert and oriented to person, place, and time.  Skin: Skin is warm and dry.  Psychiatric: She has a normal mood and affect. Judgment normal.    ED Course  Procedures (including critical care time) Labs Review Labs Reviewed  CBC - Abnormal; Notable for the following:    WBC 12.9 (*)    All other components within normal limits  BASIC METABOLIC PANEL - Abnormal; Notable for the following:    Glucose, Bld 122 (*)    GFR calc non Af Amer 68 (*)    GFR calc Af Amer 79 (*)    All other components within normal limits  PROTIME-INR  TROPONIN  I  I-STAT TROPOININ, ED   Imaging Review Dg Chest 2 View  03/21/2013   CLINICAL DATA:  CHEST PAIN  EXAM: CHEST  2 VIEW  COMPARISON:  Two view chest dated 05/19/2010  FINDINGS: Low lung volumes the cardiac silhouette is enlarged. Both lungs are clear. The visualized skeletal structures are unremarkable. Stable implanted cardiac event monitor.  IMPRESSION: No active cardiopulmonary disease.   Electronically Signed   By: Margaree Mackintosh M.D.   On: 03/21/2013 23:26     EKG Interpretation   Date/Time:  Wednesday March 21 2013 19:10:37 EST Ventricular Rate:  95 PR Interval:  142 QRS Duration: 92 QT Interval:  318 QTC Calculation: 399 R Axis:   -27 Text Interpretation:  Normal sinus rhythm Low voltage QRS Possible Lateral  infarct , age undetermined Abnormal ECG No significant change was found  Confirmed by Mansur Patti  MD, Lennette Bihari ([id number]) on 03/21/2013 9:44:04 PM      MDM   Final diagnoses:  Chest pain    EKG without ischemic changes.  Chest has been rather constant.  Atypical in description.  Troponin x2 is negative.  History of coronary spasm before in the past.  At time of discharge the patient is feeling better.  Outpatient cardiology followup.  My suspicion is that there is is not cardiac chest discomfort.  Vital signs stable.  Chest x-ray without evidence of infiltrate.  Discharge home in good condition.  She understands return to the ER for new or worsening symptoms.  She understands the importance of cardiology followup.    Hoy Morn, MD 03/23/13 [phone]

## 2013-03-22 NOTE — Discharge Instructions (Signed)
Chest Pain (Nonspecific) °It is often hard to give a specific diagnosis for the cause of chest pain. There is always a chance that your pain could be related to something serious, such as a heart attack or a blood clot in the lungs. You need to follow up with your caregiver for further evaluation. °CAUSES  °· Heartburn. °· Pneumonia or bronchitis. °· Anxiety or stress. °· Inflammation around your heart (pericarditis) or lung (pleuritis or pleurisy). °· A blood clot in the lung. °· A collapsed lung (pneumothorax). It can develop suddenly on its own (spontaneous pneumothorax) or from injury (trauma) to the chest. °· Shingles infection (herpes zoster virus). °The chest wall is composed of bones, muscles, and cartilage. Any of these can be the source of the pain. °· The bones can be bruised by injury. °· The muscles or cartilage can be strained by coughing or overwork. °· The cartilage can be affected by inflammation and become sore (costochondritis). °DIAGNOSIS  °Lab tests or other studies, such as X-rays, electrocardiography, stress testing, or cardiac imaging, may be needed to find the cause of your pain.  °TREATMENT  °· Treatment depends on what may be causing your chest pain. Treatment may include: °· Acid blockers for heartburn. °· Anti-inflammatory medicine. °· Pain medicine for inflammatory conditions. °· Antibiotics if an infection is present. °· You may be advised to change lifestyle habits. This includes stopping smoking and avoiding alcohol, caffeine, and chocolate. °· You may be advised to keep your head raised (elevated) when sleeping. This reduces the chance of acid going backward from your stomach into your esophagus. °· Most of the time, nonspecific chest pain will improve within 2 to 3 days with rest and mild pain medicine. °HOME CARE INSTRUCTIONS  °· If antibiotics were prescribed, take your antibiotics as directed. Finish them even if you start to feel better. °· For the next few days, avoid physical  activities that bring on chest pain. Continue physical activities as directed. °· Do not smoke. °· Avoid drinking alcohol. °· Only take over-the-counter or prescription medicine for pain, discomfort, or fever as directed by your caregiver. °· Follow your caregiver's suggestions for further testing if your chest pain does not go away. °· Keep any follow-up appointments you made. If you do not go to an appointment, you could develop lasting (chronic) problems with pain. If there is any problem keeping an appointment, you must call to reschedule. °SEEK MEDICAL CARE IF:  °· You think you are having problems from the medicine you are taking. Read your medicine instructions carefully. °· Your chest pain does not go away, even after treatment. °· You develop a rash with blisters on your chest. °SEEK IMMEDIATE MEDICAL CARE IF:  °· You have increased chest pain or pain that spreads to your arm, neck, jaw, back, or abdomen. °· You develop shortness of breath, an increasing cough, or you are coughing up blood. °· You have severe back or abdominal pain, feel nauseous, or vomit. °· You develop severe weakness, fainting, or chills. °· You have a fever. °THIS IS AN EMERGENCY. Do not wait to see if the pain will go away. Get medical help at once. Call your local emergency services (911 in [place] Do not drive yourself to the hospital. °MAKE SURE YOU:  °· Understand these instructions. °· Will watch your condition. °· Will get help right away if you are not doing well or get worse. °Document Released: 10/14/2004 Document Revised: 03/29/2011 Document Reviewed: 08/10/2007 °[employer]® Patient Information ©2014 [employer]   [employer]. ° °

## 2013-03-27 ENCOUNTER — Encounter: Payer: Self-pay | Admitting: Internal Medicine

## 2013-03-27 ENCOUNTER — Ambulatory Visit (INDEPENDENT_AMBULATORY_CARE_PROVIDER_SITE_OTHER): Payer: PRIVATE HEALTH INSURANCE | Admitting: Internal Medicine

## 2013-03-27 VITALS — BP 124/79 | HR 100 | Ht 65.5 in | Wt 220.0 lb

## 2013-03-27 DIAGNOSIS — R55 Syncope and collapse: Secondary | ICD-10-CM

## 2013-03-27 NOTE — Patient Instructions (Signed)
Your physician recommends that you schedule a follow-up appointment in: 10 days with device clinic for wound check   LINQ implant on Monday 04/02/13 at 9:30a  Please check in at the [facility] main Entrance at 8:30am

## 2013-03-27 NOTE — Progress Notes (Signed)
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  She was unable to walk and underwent stress perfusion imaging which was unremarkable. She also wore a cardiac monitor which demonstrated PACs and PVCs but no sustained arrhythmias. She continues to have episodes of nonexertional chest pain. 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 She had another episode of syncope which occurred at church while standing. On awakening she felt like her heart was beating fast. She eventually improved but thought that she was out for less than a minute, maybe only a few seconds.  Allergies  Allergen Reactions  . Contrast Media [Iodinated Diagnostic Agents] Anaphylaxis  . Gadolinium Anaphylaxis       . Aspirin Nausea And Vomiting    Fast heart rate  . Iohexol Other (See Comments)    unknown     Current Outpatient Prescriptions  Medication Sig Dispense Refill  . alendronate (FOSAMAX) 70 MG tablet Take 70 mg by mouth every 7 (seven) days.       . ALPRAZolam (XANAX) 0.5 MG tablet Take 1 tablet by mouth 2 (two) times daily.      Marland Kitchen amitriptyline (ELAVIL) 25 MG tablet Take 1 tablet (25 mg total) by mouth at bedtime.  90 tablet  3  . atorvastatin (LIPITOR) 10 MG tablet Take 10 mg by mouth daily.        . calcium carbonate (TUMS - DOSED IN MG ELEMENTAL CALCIUM) 500 MG chewable tablet Chew 2 tablets by mouth daily as needed for indigestion or heartburn.      . clopidogrel (PLAVIX) 75 MG tablet Take 75 mg by mouth daily.       . cycloSPORINE (RESTASIS) 0.05 % ophthalmic emulsion Place 1 drop into both eyes 2 (two) times daily.        Marland Kitchen DEXILANT 60 MG capsule 1 capsule daily.      . diazepam (VALIUM) 5 MG tablet Take 5 mg by mouth every 6 (six) hours as needed. For muscle spasms      . diltiazem (CARDIZEM CD) 180 MG 24 hr capsule Take 1 capsule (180 mg total) by mouth daily.  90 capsule  3  .  erythromycin (E-MYCIN) 250 MG tablet Take 1 tablet (250 mg total) by mouth 4 (four) times daily.  120 tablet  0  . glimepiride (AMARYL) 2 MG tablet Take 1 tablet (2 mg total) by mouth 2 (two) times daily.  30 tablet  0  . HYDROcodone-acetaminophen (NORCO/VICODIN) 5-325 MG per tablet Take 1 tablet by mouth every 4 (four) hours as needed for moderate pain.  15 tablet  0  . hyoscyamine (LEVBID) 0.375 MG 12 hr tablet TAKE 1 TABLET BY MOUTH TWICE DAILY FOR ABDOMINAL PAIN  60 tablet  3  . isosorbide mononitrate (IMDUR) 60 MG 24 hr tablet Take 60 mg by mouth 2 (two) times daily.       Marland Kitchen losartan (COZAAR) 100 MG tablet Take 100 mg by mouth daily.       . metFORMIN (GLUCOPHAGE) 1000 MG tablet Take 1 tablet (1,000 mg total) by mouth 2 (two) times daily with a meal.  30 tablet  0  . metoCLOPramide (REGLAN) 10 MG tablet Take one tab before each meal and at bedtime  60 tablet  3  . nitroGLYCERIN (NITROLINGUAL) 0.4 MG/SPRAY spray Place 1 spray under the tongue every 5 (five) minutes as needed. Chest  pains        . Olopatadine HCl (PATADAY) 0.2 % SOLN Place 1 drop into both eyes daily.        . pantoprazole (PROTONIX) 40 MG tablet       . PROAIR HFA 108 (90 BASE) MCG/ACT inhaler Inhale 1 puff into the lungs every 4 (four) hours as needed.       . saxagliptin HCl (ONGLYZA) 5 MG TABS tablet Take 5 mg by mouth daily.       Marland Kitchen topiramate (TOPAMAX) 100 MG tablet TAKE 1 TABLET BY MOUTH EVERY EVENING.  90 tablet  1  . topiramate (TOPAMAX) 25 MG tablet TAKE 1 TABLET BY MOUTH EVERY MORNING.  90 tablet  1  . traMADol (ULTRAM) 50 MG tablet Take 50 mg by mouth every 6 (six) hours as needed.       . zolpidem (AMBIEN) 10 MG tablet Take 10 mg by mouth at bedtime as needed.        No current facility-administered medications for this visit.     Past Medical History  Diagnosis Date  . Myocardial infarction 1992 & 1998    NM Myocar Multi W/Spect W/Wall Motion EF on 08/07/10 - IMPRESSION: 1) No evidence of ischemia.  2)  Stable focal infarct involving the apicoseptal wall when compared to prior exams.  3) Normal LV wall motion.  4) Estimated Q G S EF 70%, unchanged.  Marland Kitchen Dysrhythmia     arrythmia and spasms occ none for 8-9 months drf frasier  seeing me while dr sprull out with back  . Angina     none recently  . Asthma   . Blood transfusion   . GERD (gastroesophageal reflux disease)   . Hiatal hernia   . Other forms of migraine, without mention of intractable migraine without mention of status migrainosus   . Chest pain   . Dry eye     Chronic  . Hypercholesteremia   . Lumbago   . Lumbar radiculopathy   . Spondylolisthesis   . Diverticulosis of colon (without mention of hemorrhage)     Colonoscopy 11/09/09 - FINDINGS: 1) 68mm polp in transverse colon, resected & retrieved.  2) Diverticulosis in sigmoid & descending colon.  3) Medium-sized, internal hemorrhoids.  . Internal hemorrhoids without mention of complication     Colonoscopy 11/09/09 - FINDINGS: 1) 33mm polp in transverse colon, resected & retrieved.  2) Diverticulosis in sigmoid & descending colon.  3) Medium-sized, internal hemorrhoids.  . Benign neoplasm of colon 11/09/09    Colonoscopy 11/09/09 - FINDINGS: 1) 78mm polp in transverse colon, resected & retrieved.  2) Diverticulosis in sigmoid & descending colon.  3) Medium-sized, internal hemorrhoids.  . Hemorrhage of rectum and anus     Colonoscopy 11/09/09 - FINDINGS: 1) 54mm polp in transverse colon, resected & retrieved.  2) Diverticulosis in sigmoid & descending colon.  3) Medium-sized, internal hemorrhoids.  . Status post placement of implantable loop recorder 11/15/06    Medtronic -Model # [id number] -Seriel # [id number]  . Essential hypertension, benign   . Esophagitis   . SOB (shortness of breath)     At rest & on exertion  . CMC arthritis     Left middle finger  . Depression     40+ years  . Diabetes mellitus 10/2008    type 2  . Breast cancer 1989    Left breast cancer  . Obese    . Syncope     ILR impanted 11/15/06 Medtronic -Model #  [place] # [id number]  . Facet arthropathy, lumbar     L3-4  . Preoperative clearance     NM Myocar Multi W/Spect W/Wall Motion EF 08/07/10  IMPRESSION: 1) No evidence of ischemia.  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  3) Normal LV wall motion.  4) Estimated Q G S EF 70%, unchanged.  . Obstructive sleep apnea     uses oxygen at night  . On supplemental oxygen therapy     at night-did not need a cpap    ROS:   All systems reviewed and negative except as noted in the HPI.   Past Surgical History  Procedure Laterality Date  . Abdominal hysterectomy  1978  . Joint replacement      lsft hip x2, rt knee  . Knee arthroscopy Right 2002    By Dr. Lorin Mercy  . Foot surgery Bilateral     Bil feet great toes and second toes spurs. By Dr. Milinda Pointer in 2004.  . Breast biopsy Left 1995    Biopsy at [facility] ([facility])  . Tonsillectomy    . Back surgery      x2 one for infection  . Cholecystectomy    . Total hip revision  12/14/2010    Procedure: TOTAL HIP REVISION;  Surgeon: Marybelle Killings;  Location: [facility];  Service: Orthopedics;  Laterality: Left;  Left Total Hip Arthroplasty Revision, Poly and Ball Exchange, Possible Acetabular Revision vs. Poly Exchange  . Colonoscopy w/ polypectomy  11/09/09    FINDINGS: 1) 50mm polp in transverse colon, resected & retrieved.  2) Diverticulosis in sigmoid & descending colon.  3) Medium-sized, internal hemorrhoids.  . Loop recorder implant  11/15/06    For recurring syncope. Medtronic -Model # 9528 -Seriel # [id number]  . Hemorrhoid surgery  2011    Internal & external hemorrhoidectomy (x) 3 quadrants  . Esophagogastroduodenoscopy  02/15/07  . Dilation and curettage of uterus    . Trigger finger release Left 01/02/2013    Procedure: RELEASE LEFT MIDDLE TRIGGER FINGER/LEFT INDEX AND LEFT RING FINGER;  Surgeon: Wynonia Sours, MD;  Location:  [facility];  Service: Orthopedics;  Laterality: Left;  ANESTHESIA: GENERAL/IV REGIONAL FAB     Family History  Problem Relation Age of Onset  . Pancreatic cancer Mother   . Heart attack Father   . Diabetes Son   . Arthritis Brother     Severe/Crippling  . Tuberculosis Brother   . Hypertension Brother     x 2  . Heart disease Sister   . Diabetes Brother   . Seizures Brother   . Autoimmune disease Brother     AIDS  . Colon cancer Brother   . Lung cancer Brother     mets     History   Social History  . Marital Status: Single    Spouse Name: N/A    Number of Children: 2  . Years of Education: college   Occupational History  . Retired    Social History Main Topics  . Smoking status: Former Smoker    Types: Cigarettes    Quit date: 01/19/1983  . Smokeless tobacco: Never Used  . Alcohol Use: No  . Drug Use: No  . Sexual Activity: Not on file   Other Topics Concern  . Not on file   Social History Narrative   Patient is single and lives alone.   Patient has two children.   Patient  is retired.   Patient has a college education.   Patient is right-handed.   Patient does not drink any caffeine.     BP 124/79  Pulse 100  Ht 5' 5.5" (1.664 m)  Wt 220 lb (99.791 kg)  BMI 36.04 kg/m2  Physical Exam:  obese appearing 68 year old woman, NAD HEENT: Unremarkable Neck:  No JVD, no thyromegally Back:  No CVA tenderness Lungs:  Clear with no wheezes, rales, or rhonchi. HEART:  Regular rate rhythm, no murmurs, no rubs, no clicks Abd:  soft, positive bowel sounds, no organomegally, no rebound, no guarding Ext:  2 plus pulses, no edema, no cyanosis, no clubbing Skin:  No rashes no nodules Neuro:  CN II through XII intact, motor grossly intact  Cardiac monitor - sinus rhythm with PVCs   Assess/Plan:

## 2013-03-27 NOTE — Assessment & Plan Note (Signed)
This continues to be a difficult problem. She remotely had a loop recorder inserted but did not get any followup. Her device has been dead for years. With new data concerning pacing for patients with autonomic dysfunction who have syncope and bradycardia, utilizing CLS pacing, I have recommended inserting another ILR. The risks/benefits/goals/expectations of the procedure have been discussed and she wishes to proceed.

## 2013-03-30 ENCOUNTER — Telehealth: Payer: Self-pay | Admitting: *Deleted

## 2013-03-30 NOTE — Telephone Encounter (Signed)
Called pt to inform them of schedule change for procedure Monday. Pt to arrive at hospital by 6 am for LINQ at 7:30 am. Patient verbalized understanding and agreeable to plan.

## 2013-04-01 MED ORDER — SODIUM CHLORIDE 0.9 % IR SOLN
80.0000 mg | Status: DC
  Filled 2013-04-01: qty 2

## 2013-04-01 MED ORDER — CEFAZOLIN SODIUM-DEXTROSE 2-3 GM-% IV SOLR: 2.0000 g | INTRAVENOUS | Status: DC

## 2013-04-02 ENCOUNTER — Encounter (HOSPITAL_COMMUNITY)
Admission: RE | Disposition: A | Discharge status: Home / Self Care | Payer: Self-pay | Source: Ambulatory Visit | Attending: Internal Medicine

## 2013-04-02 ENCOUNTER — Ambulatory Visit (HOSPITAL_COMMUNITY)
Admission: RE | Admit: 2013-04-02 | Discharge: 2013-04-02 | Disposition: A | Discharge status: Home / Self Care | Payer: PRIVATE HEALTH INSURANCE | Source: Ambulatory Visit | Attending: Internal Medicine | Admitting: Internal Medicine

## 2013-04-02 ENCOUNTER — Encounter (HOSPITAL_COMMUNITY): Payer: Self-pay | Admitting: *Deleted

## 2013-04-02 ENCOUNTER — Encounter (HOSPITAL_COMMUNITY): Payer: Self-pay | Admitting: Pharmacy Technician

## 2013-04-02 DIAGNOSIS — I252 Old myocardial infarction: Secondary | ICD-10-CM | POA: Insufficient documentation

## 2013-04-02 DIAGNOSIS — E119 Type 2 diabetes mellitus without complications: Secondary | ICD-10-CM | POA: Insufficient documentation

## 2013-04-02 DIAGNOSIS — E785 Hyperlipidemia, unspecified: Secondary | ICD-10-CM | POA: Insufficient documentation

## 2013-04-02 DIAGNOSIS — K219 Gastro-esophageal reflux disease without esophagitis: Secondary | ICD-10-CM | POA: Insufficient documentation

## 2013-04-02 DIAGNOSIS — H04129 Dry eye syndrome of unspecified lacrimal gland: Secondary | ICD-10-CM | POA: Insufficient documentation

## 2013-04-02 DIAGNOSIS — E7800 Pure hypercholesterolemia, unspecified: Secondary | ICD-10-CM | POA: Insufficient documentation

## 2013-04-02 DIAGNOSIS — G43809 Other migraine, not intractable, without status migrainosus: Secondary | ICD-10-CM | POA: Insufficient documentation

## 2013-04-02 DIAGNOSIS — Z7902 Long term (current) use of antithrombotics/antiplatelets: Secondary | ICD-10-CM | POA: Insufficient documentation

## 2013-04-02 DIAGNOSIS — Z7983 Long term (current) use of bisphosphonates: Secondary | ICD-10-CM | POA: Insufficient documentation

## 2013-04-02 DIAGNOSIS — Z4509 Encounter for adjustment and management of other cardiac device: Secondary | ICD-10-CM | POA: Insufficient documentation

## 2013-04-02 DIAGNOSIS — F329 Major depressive disorder, single episode, unspecified: Secondary | ICD-10-CM | POA: Insufficient documentation

## 2013-04-02 DIAGNOSIS — Z6836 Body mass index (BMI) 36.0-36.9, adult: Secondary | ICD-10-CM | POA: Insufficient documentation

## 2013-04-02 DIAGNOSIS — R55 Syncope and collapse: Secondary | ICD-10-CM | POA: Insufficient documentation

## 2013-04-02 DIAGNOSIS — K449 Diaphragmatic hernia without obstruction or gangrene: Secondary | ICD-10-CM | POA: Insufficient documentation

## 2013-04-02 DIAGNOSIS — I209 Angina pectoris, unspecified: Secondary | ICD-10-CM | POA: Insufficient documentation

## 2013-04-02 DIAGNOSIS — J45909 Unspecified asthma, uncomplicated: Secondary | ICD-10-CM | POA: Insufficient documentation

## 2013-04-02 DIAGNOSIS — K209 Esophagitis, unspecified without bleeding: Secondary | ICD-10-CM | POA: Insufficient documentation

## 2013-04-02 DIAGNOSIS — F3289 Other specified depressive episodes: Secondary | ICD-10-CM | POA: Insufficient documentation

## 2013-04-02 DIAGNOSIS — Z9981 Dependence on supplemental oxygen: Secondary | ICD-10-CM | POA: Insufficient documentation

## 2013-04-02 DIAGNOSIS — E669 Obesity, unspecified: Secondary | ICD-10-CM | POA: Insufficient documentation

## 2013-04-02 DIAGNOSIS — Q762 Congenital spondylolisthesis: Secondary | ICD-10-CM | POA: Insufficient documentation

## 2013-04-02 DIAGNOSIS — I499 Cardiac arrhythmia, unspecified: Secondary | ICD-10-CM | POA: Insufficient documentation

## 2013-04-02 DIAGNOSIS — I10 Essential (primary) hypertension: Secondary | ICD-10-CM | POA: Insufficient documentation

## 2013-04-02 DIAGNOSIS — K5730 Diverticulosis of large intestine without perforation or abscess without bleeding: Secondary | ICD-10-CM | POA: Insufficient documentation

## 2013-04-02 DIAGNOSIS — G4733 Obstructive sleep apnea (adult) (pediatric): Secondary | ICD-10-CM | POA: Insufficient documentation

## 2013-04-02 HISTORY — PX: LOOP RECORDER IMPLANT: SHX5477

## 2013-04-02 LAB — GLUCOSE, CAPILLARY: GLUCOSE-CAPILLARY: 88 mg/dL (ref 70–99)

## 2013-04-02 SURGERY — LOOP RECORDER IMPLANT
Anesthesia: LOCAL

## 2013-04-02 MED ORDER — SODIUM CHLORIDE 0.9 % IV SOLN
INTRAVENOUS | Status: DC
  Administered 2013-04-02: 07:00:00 via INTRAVENOUS

## 2013-04-02 MED ORDER — ACETAMINOPHEN 325 MG PO TABS: 325.0000 mg | ORAL_TABLET | ORAL | Status: DC | PRN

## 2013-04-02 MED ORDER — CHLORHEXIDINE GLUCONATE 4 % EX LIQD
60.0000 mL | Freq: Once | CUTANEOUS | Status: DC
  Filled 2013-04-02: qty 60

## 2013-04-02 MED ORDER — LIDOCAINE-EPINEPHRINE 1 %-1:100000 IJ SOLN
INTRAMUSCULAR | Status: AC
  Filled 2013-04-02: qty 3

## 2013-04-02 MED ORDER — ONDANSETRON HCL 4 MG/2ML IJ SOLN: 4.0000 mg | Freq: Four times a day (QID) | INTRAMUSCULAR | Status: DC | PRN

## 2013-04-02 NOTE — Interval H&P Note (Signed)
History and Physical Interval Note:  04/02/2013 7:26 AM  [name]  has presented today for surgery, with the diagnosis of Syncope  The various methods of treatment have been discussed with the patient and family. After consideration of risks, benefits and other options for treatment, the patient has consented to  Procedure(s): LOOP RECORDER IMPLANT (N/A) and removal of an old loop recorder as a surgical intervention .  The patient's history has been reviewed, patient examined, no change in status, stable for surgery.  I have reviewed the patient's chart and labs.  Questions were answered to the patient's satisfaction.     Mikle Bosworth.D.

## 2013-04-02 NOTE — CV Procedure (Signed)
Insertion of an ILR and removal of an old ILR without immediate complication. V#[id number]

## 2013-04-02 NOTE — H&P (View-Only) (Signed)
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  She was unable to walk and underwent stress perfusion imaging which was unremarkable. She also wore a cardiac monitor which demonstrated PACs and PVCs but no sustained arrhythmias. She continues to have episodes of nonexertional chest pain. 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 She had another episode of syncope which occurred at church while standing. On awakening she felt like her heart was beating fast. She eventually improved but thought that she was out for less than a minute, maybe only a few seconds.  Allergies  Allergen Reactions  . Contrast Media [Iodinated Diagnostic Agents] Anaphylaxis  . Gadolinium Anaphylaxis       . Aspirin Nausea And Vomiting    Fast heart rate  . Iohexol Other (See Comments)    unknown     Current Outpatient Prescriptions  Medication Sig Dispense Refill  . alendronate (FOSAMAX) 70 MG tablet Take 70 mg by mouth every 7 (seven) days.       . ALPRAZolam (XANAX) 0.5 MG tablet Take 1 tablet by mouth 2 (two) times daily.      Marland Kitchen amitriptyline (ELAVIL) 25 MG tablet Take 1 tablet (25 mg total) by mouth at bedtime.  90 tablet  3  . atorvastatin (LIPITOR) 10 MG tablet Take 10 mg by mouth daily.        . calcium carbonate (TUMS - DOSED IN MG ELEMENTAL CALCIUM) 500 MG chewable tablet Chew 2 tablets by mouth daily as needed for indigestion or heartburn.      . clopidogrel (PLAVIX) 75 MG tablet Take 75 mg by mouth daily.       . cycloSPORINE (RESTASIS) 0.05 % ophthalmic emulsion Place 1 drop into both eyes 2 (two) times daily.        Marland Kitchen DEXILANT 60 MG capsule 1 capsule daily.      . diazepam (VALIUM) 5 MG tablet Take 5 mg by mouth every 6 (six) hours as needed. For muscle spasms      . diltiazem (CARDIZEM CD) 180 MG 24 hr capsule Take 1 capsule (180 mg total) by mouth daily.  90 capsule  3  .  erythromycin (E-MYCIN) 250 MG tablet Take 1 tablet (250 mg total) by mouth 4 (four) times daily.  120 tablet  0  . glimepiride (AMARYL) 2 MG tablet Take 1 tablet (2 mg total) by mouth 2 (two) times daily.  30 tablet  0  . HYDROcodone-acetaminophen (NORCO/VICODIN) 5-325 MG per tablet Take 1 tablet by mouth every 4 (four) hours as needed for moderate pain.  15 tablet  0  . hyoscyamine (LEVBID) 0.375 MG 12 hr tablet TAKE 1 TABLET BY MOUTH TWICE DAILY FOR ABDOMINAL PAIN  60 tablet  3  . isosorbide mononitrate (IMDUR) 60 MG 24 hr tablet Take 60 mg by mouth 2 (two) times daily.       Marland Kitchen losartan (COZAAR) 100 MG tablet Take 100 mg by mouth daily.       . metFORMIN (GLUCOPHAGE) 1000 MG tablet Take 1 tablet (1,000 mg total) by mouth 2 (two) times daily with a meal.  30 tablet  0  . metoCLOPramide (REGLAN) 10 MG tablet Take one tab before each meal and at bedtime  60 tablet  3  . nitroGLYCERIN (NITROLINGUAL) 0.4 MG/SPRAY spray Place 1 spray under the tongue every 5 (five) minutes as needed. Chest  pains        . Olopatadine HCl (PATADAY) 0.2 % SOLN Place 1 drop into both eyes daily.        . pantoprazole (PROTONIX) 40 MG tablet       . PROAIR HFA 108 (90 BASE) MCG/ACT inhaler Inhale 1 puff into the lungs every 4 (four) hours as needed.       . saxagliptin HCl (ONGLYZA) 5 MG TABS tablet Take 5 mg by mouth daily.       Marland Kitchen topiramate (TOPAMAX) 100 MG tablet TAKE 1 TABLET BY MOUTH EVERY EVENING.  90 tablet  1  . topiramate (TOPAMAX) 25 MG tablet TAKE 1 TABLET BY MOUTH EVERY MORNING.  90 tablet  1  . traMADol (ULTRAM) 50 MG tablet Take 50 mg by mouth every 6 (six) hours as needed.       . zolpidem (AMBIEN) 10 MG tablet Take 10 mg by mouth at bedtime as needed.        No current facility-administered medications for this visit.     Past Medical History  Diagnosis Date  . Myocardial infarction 1992 & 1998    NM Myocar Multi W/Spect W/Wall Motion EF on 08/07/10 - IMPRESSION: 1) No evidence of ischemia.  2)  Stable focal infarct involving the apicoseptal wall when compared to prior exams.  3) Normal LV wall motion.  4) Estimated Q G S EF 70%, unchanged.  Marland Kitchen Dysrhythmia     arrythmia and spasms occ none for 8-9 months drf frasier  seeing me while dr sprull out with back  . Angina     none recently  . Asthma   . Blood transfusion   . GERD (gastroesophageal reflux disease)   . Hiatal hernia   . Other forms of migraine, without mention of intractable migraine without mention of status migrainosus   . Chest pain   . Dry eye     Chronic  . Hypercholesteremia   . Lumbago   . Lumbar radiculopathy   . Spondylolisthesis   . Diverticulosis of colon (without mention of hemorrhage)     Colonoscopy 11/09/09 - FINDINGS: 1) 56mm polp in transverse colon, resected & retrieved.  2) Diverticulosis in sigmoid & descending colon.  3) Medium-sized, internal hemorrhoids.  . Internal hemorrhoids without mention of complication     Colonoscopy 11/09/09 - FINDINGS: 1) 25mm polp in transverse colon, resected & retrieved.  2) Diverticulosis in sigmoid & descending colon.  3) Medium-sized, internal hemorrhoids.  . Benign neoplasm of colon 11/09/09    Colonoscopy 11/09/09 - FINDINGS: 1) 13mm polp in transverse colon, resected & retrieved.  2) Diverticulosis in sigmoid & descending colon.  3) Medium-sized, internal hemorrhoids.  . Hemorrhage of rectum and anus     Colonoscopy 11/09/09 - FINDINGS: 1) 31mm polp in transverse colon, resected & retrieved.  2) Diverticulosis in sigmoid & descending colon.  3) Medium-sized, internal hemorrhoids.  . Status post placement of implantable loop recorder 11/15/06    Medtronic -Model # 9528 -Seriel # [id number] H  . Essential hypertension, benign   . Esophagitis   . SOB (shortness of breath)     At rest & on exertion  . CMC arthritis     Left middle finger  . Depression     40+ years  . Diabetes mellitus 10/2008    type 2  . Breast cancer 1989    Left breast cancer  . Obese    . Syncope     ILR impanted 11/15/06 Medtronic -Model #  [place] # [id number]  . Facet arthropathy, lumbar     L3-4  . Preoperative clearance     NM Myocar Multi W/Spect W/Wall Motion EF 08/07/10  IMPRESSION: 1) No evidence of ischemia.  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  3) Normal LV wall motion.  4) Estimated Q G S EF 70%, unchanged.  . Obstructive sleep apnea     uses oxygen at night  . On supplemental oxygen therapy     at night-did not need a cpap    ROS:   All systems reviewed and negative except as noted in the HPI.   Past Surgical History  Procedure Laterality Date  . Abdominal hysterectomy  1978  . Joint replacement      lsft hip x2, rt knee  . Knee arthroscopy Right 2002    By Dr. Lorin Mercy  . Foot surgery Bilateral     Bil feet great toes and second toes spurs. By Dr. Milinda Pointer in 2004.  . Breast biopsy Left 1995    Biopsy at [facility] ([facility])  . Tonsillectomy    . Back surgery      x2 one for infection  . Cholecystectomy    . Total hip revision  12/14/2010    Procedure: TOTAL HIP REVISION;  Surgeon: Marybelle Killings;  Location: [facility];  Service: Orthopedics;  Laterality: Left;  Left Total Hip Arthroplasty Revision, Poly and Ball Exchange, Possible Acetabular Revision vs. Poly Exchange  . Colonoscopy w/ polypectomy  11/09/09    FINDINGS: 1) 50mm polp in transverse colon, resected & retrieved.  2) Diverticulosis in sigmoid & descending colon.  3) Medium-sized, internal hemorrhoids.  . Loop recorder implant  11/15/06    For recurring syncope. Medtronic -Model # 9528 -Seriel # [id number] H  . Hemorrhoid surgery  2011    Internal & external hemorrhoidectomy (x) 3 quadrants  . Esophagogastroduodenoscopy  02/15/07  . Dilation and curettage of uterus    . Trigger finger release Left 01/02/2013    Procedure: RELEASE LEFT MIDDLE TRIGGER FINGER/LEFT INDEX AND LEFT RING FINGER;  Surgeon: Wynonia Sours, MD;  Location:  [facility];  Service: Orthopedics;  Laterality: Left;  ANESTHESIA: GENERAL/IV REGIONAL FAB     Family History  Problem Relation Age of Onset  . Pancreatic cancer Mother   . Heart attack Father   . Diabetes Son   . Arthritis Brother     Severe/Crippling  . Tuberculosis Brother   . Hypertension Brother     x 2  . Heart disease Sister   . Diabetes Brother   . Seizures Brother   . Autoimmune disease Brother     AIDS  . Colon cancer Brother   . Lung cancer Brother     mets     History   Social History  . Marital Status: Single    Spouse Name: N/A    Number of Children: 2  . Years of Education: college   Occupational History  . Retired    Social History Main Topics  . Smoking status: Former Smoker    Types: Cigarettes    Quit date: 01/19/1983  . Smokeless tobacco: Never Used  . Alcohol Use: No  . Drug Use: No  . Sexual Activity: Not on file   Other Topics Concern  . Not on file   Social History Narrative   Patient is single and lives alone.   Patient has two children.   Patient  is retired.   Patient has a college education.   Patient is right-handed.   Patient does not drink any caffeine.     BP 124/79  Pulse 100  Ht 5' 5.5" (1.664 m)  Wt 220 lb (99.791 kg)  BMI 36.04 kg/m2  Physical Exam:  obese appearing 68 year old woman, NAD HEENT: Unremarkable Neck:  No JVD, no thyromegally Back:  No CVA tenderness Lungs:  Clear with no wheezes, rales, or rhonchi. HEART:  Regular rate rhythm, no murmurs, no rubs, no clicks Abd:  soft, positive bowel sounds, no organomegally, no rebound, no guarding Ext:  2 plus pulses, no edema, no cyanosis, no clubbing Skin:  No rashes no nodules Neuro:  CN II through XII intact, motor grossly intact  Cardiac monitor - sinus rhythm with PVCs   Assess/Plan:

## 2013-04-02 NOTE — Op Note (Signed)
NAMEKADEN, DUNKEL                 ACCOUNT NO.:  [id number]  MEDICAL RECORD NO.:  [id number]  LOCATION:  [facility]                         FACILITY:  [facility]  PHYSICIAN:  Champ Mungo. Lovena Le, MD    DATE OF BIRTH:  [birth year]  DATE OF PROCEDURE:  04/02/2013 DATE OF DISCHARGE:  04/02/2013                              OPERATIVE REPORT   PROCEDURE PERFORMED:  Insertion of a new implantable loop recorder and then removal of a remotely implanted old loop recorder which has long since reached elective replacement.  INTRODUCTION:  The patient is a 68 year old woman who underwent insertion of an implantable loop recorder approximately 7 years ago. The device has been there for many years.  The patient's initial syncopal episodes resolved, but she has had recurrent episodes of unexplained syncope over the past several months.  She is now referred for insertion of a new implantable loop recorder.  DESCRIPTION OF PROCEDURE:  After informed consent was obtained, the patient was prepped and draped in usual manner.  A 30 mL of lidocaine was infiltrated into the left pectoral region.  A 1-cm stab incision was carried out, and the Medtronic  Reveal LINQ implantable loop recorder serial #[id number] was inserted into the subcutaneous space.  At this point, a 2-cm incision was carried out over the old implantable loop recorder and blunt dissection was carried out down through the device and it was removed with gentle traction.  The old loop recorder incision was reapproximated with 2-0 Vicryl suture.  Mapping of the new loop recorder demonstrated R-waves that were less ideal at approximately 0.1 mV.  We tried to remove the newly implanted loop recorder, but probing of the new loop recorder was unsuccessful secondary to the patient's significant subcutaneous tissue.  At this point, the benzoin and Steri- Strips were painted over the skin and the patient was recovered in the usual manner.  COMPLICATIONS:  There  were no immediate procedure complications.  RESULTS:  This demonstrates successful insertion of a Medtronic loop recorder, and removal of an old implantable loop recorder with no immediate procedure complications.     Champ Mungo. Lovena Le, MD     GWT/MEDQ  D:  04/02/2013  T:  04/02/2013  Job:  [id number]

## 2013-04-03 ENCOUNTER — Telehealth: Payer: Self-pay | Admitting: Internal Medicine

## 2013-04-03 NOTE — Telephone Encounter (Signed)
Spoke with patient and have asked that she remove the top bandage.  There is probably old dried blood on the bandage.  She is to leave the steri strip on until her follow up unless it falls off on its on.  She knows not to shower and will call me back if the area is still bleeding.

## 2013-04-03 NOTE — Telephone Encounter (Signed)
New problem   Pt has a question concerning her dressing changes to her site where she had sx 04/03/13. Please call pt

## 2013-04-06 ENCOUNTER — Telehealth: Payer: Self-pay

## 2013-04-06 NOTE — Telephone Encounter (Signed)
[employer] sent [place] a letter stating they have approved our request for coverage on Amitriptyline effective until 04/05/2014 Ref # [id number]

## 2013-04-12 ENCOUNTER — Encounter: Payer: Self-pay | Admitting: Internal Medicine

## 2013-04-12 ENCOUNTER — Ambulatory Visit: Payer: PRIVATE HEALTH INSURANCE

## 2013-04-12 ENCOUNTER — Ambulatory Visit (INDEPENDENT_AMBULATORY_CARE_PROVIDER_SITE_OTHER): Payer: PRIVATE HEALTH INSURANCE | Admitting: *Deleted

## 2013-04-12 DIAGNOSIS — R55 Syncope and collapse: Secondary | ICD-10-CM

## 2013-04-12 LAB — MDC_IDC_ENUM_SESS_TYPE_INCLINIC

## 2013-04-12 NOTE — Progress Notes (Signed)
Wound check-ILR.  15 pauses and 30 brady episodes noted were all undersensing.  Steri stips removed.  Wound well healed.

## 2013-05-08 LAB — HM DEXA SCAN

## 2013-05-22 ENCOUNTER — Ambulatory Visit (INDEPENDENT_AMBULATORY_CARE_PROVIDER_SITE_OTHER): Payer: Medicare Other | Admitting: Podiatry

## 2013-05-22 ENCOUNTER — Encounter: Payer: Self-pay | Admitting: Podiatry

## 2013-05-22 VITALS — BP 134/92 | HR 88 | Resp 16

## 2013-05-22 DIAGNOSIS — B351 Tinea unguium: Secondary | ICD-10-CM

## 2013-05-22 DIAGNOSIS — M79609 Pain in unspecified limb: Secondary | ICD-10-CM

## 2013-05-22 NOTE — Progress Notes (Signed)
She presents today chief complaint of painful elongated toenails one through 5 bilateral. She's also complaining of some pain about the first metatarsophalangeal joint of the right foot where Keller arthroplasty was performed many years ago.  Objective: Vital signs are stable she is alert and oriented x3 pulses are strongly palpable bilateral. She has limited range of motion of the first metatarsophalangeal joint or the likely secondary to scar tissue and she has no reproducible pain on palpation.  Assessment: Pain in limb secondary to onychomycosis 1 through 5 bilateral.  Plan: Debridement of nails 1 through 5 bilateral covered service secondary to pain. Should she still have pain along the first metatarsophalangeal joint of the right foot next visit we with exception of x-rays.

## 2013-05-25 ENCOUNTER — Other Ambulatory Visit: Payer: Self-pay | Admitting: Gastroenterology

## 2013-06-06 ENCOUNTER — Other Ambulatory Visit: Payer: Self-pay

## 2013-06-06 DIAGNOSIS — K551 Chronic vascular disorders of intestine: Secondary | ICD-10-CM

## 2013-06-06 NOTE — Telephone Encounter (Signed)
Pt has been approved by Ins for MRI of abdomen without contrast. Called to schedule MRI but pt had a new coil loop placed in March. Per MRI at [facility] there would be so much artifact from the coil that you would not be able to see the abdomen like you need to. Dr. Deatra Ina please advise.

## 2013-06-06 NOTE — Telephone Encounter (Signed)
Pt scheduled for OV with Dr. Deatra Ina 07/27/13@9 :30am. Left message for pt to call back.

## 2013-06-06 NOTE — Telephone Encounter (Signed)
Then we're stuck because in a properly had anaphylaxis with iodine. Please give her return office visit

## 2013-06-07 NOTE — Telephone Encounter (Signed)
Spoke with pt and she is aware.

## 2013-07-10 ENCOUNTER — Encounter: Payer: Self-pay | Admitting: Internal Medicine

## 2013-07-10 ENCOUNTER — Ambulatory Visit (INDEPENDENT_AMBULATORY_CARE_PROVIDER_SITE_OTHER): Payer: PRIVATE HEALTH INSURANCE | Admitting: Internal Medicine

## 2013-07-10 VITALS — BP 141/84 | HR 98 | Ht 64.5 in | Wt 220.0 lb

## 2013-07-10 DIAGNOSIS — R002 Palpitations: Secondary | ICD-10-CM

## 2013-07-10 DIAGNOSIS — R55 Syncope and collapse: Secondary | ICD-10-CM

## 2013-07-10 DIAGNOSIS — E1149 Type 2 diabetes mellitus with other diabetic neurological complication: Secondary | ICD-10-CM

## 2013-07-10 NOTE — Progress Notes (Signed)
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  She was unable to walk and underwent stress perfusion imaging which was unremarkable. She also wore a cardiac monitor which demonstrated PACs and PVCs but no sustained arrhythmias. She continues to have episodes of nonexertional chest pain. 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 She had another episode of syncope which occurred at church while standing. On awakening she felt like her heart was beating fast. She eventually improved but thought that she was out for less than a minute, maybe only a few seconds.  Allergies  Allergen Reactions  . Contrast Media [Iodinated Diagnostic Agents] Anaphylaxis  . Gadolinium Anaphylaxis       . Aspirin Nausea And Vomiting    Fast heart rate  . Iohexol Other (See Comments)    unknown     Current Outpatient Prescriptions  Medication Sig Dispense Refill  . amitriptyline (ELAVIL) 25 MG tablet Take 1 tablet (25 mg total) by mouth at bedtime.  90 tablet  3  . atorvastatin (LIPITOR) 10 MG tablet Take 10 mg by mouth daily.       . clopidogrel (PLAVIX) 75 MG tablet Take 75 mg by mouth daily.       . cycloSPORINE (RESTASIS) 0.05 % ophthalmic emulsion Place 1 drop into both eyes 2 (two) times daily.       Marland Kitchen DEXILANT 60 MG capsule Take 1 capsule by mouth daily.       . diazepam (VALIUM) 5 MG tablet Take 5 mg by mouth 2 (two) times daily. For muscle spasms      . diltiazem (CARDIZEM CD) 180 MG 24 hr capsule Take 1 capsule (180 mg total) by mouth daily.  90 capsule  3  . gabapentin (NEURONTIN) 100 MG capsule Take 1 capsule by mouth at bedtime.       Marland Kitchen glimepiride (AMARYL) 2 MG tablet Take 1 tablet (2 mg total) by mouth 2 (two) times daily.  30 tablet  0  . HYDROcodone-acetaminophen (NORCO/VICODIN) 5-325 MG per tablet Take 1 tablet by mouth every 4 (four) hours as needed for moderate pain.       . hyoscyamine (LEVBID) 0.375 MG 12 hr tablet Take 0.375 mg by mouth 2 (two) times daily.      . isosorbide mononitrate (IMDUR) 60 MG 24 hr tablet Take 60 mg by mouth 2 (two) times daily.       Marland Kitchen losartan (COZAAR) 100 MG tablet Take 100 mg by mouth daily.       . metFORMIN (GLUCOPHAGE) 1000 MG tablet Take 1 tablet (1,000 mg total) by mouth 2 (two) times daily with a meal.  30 tablet  0  . metoCLOPramide (REGLAN) 10 MG tablet Take one tab before each meal and at bedtime      . nitroGLYCERIN (NITROLINGUAL) 0.4 MG/SPRAY spray Place 1 spray under the tongue every 5 (five) minutes as needed. Chest pains        . Olopatadine HCl (PATADAY) 0.2 % SOLN Place 1 drop into both eyes daily.       . pantoprazole (PROTONIX) 40 MG tablet Take 40 mg by mouth 2 (two) times daily.       Marland Kitchen PROAIR HFA 108 (90 BASE) MCG/ACT inhaler Inhale 2 puffs into the lungs every 4 (four) hours as needed for shortness of breath.       . saxagliptin  HCl (ONGLYZA) 5 MG TABS tablet Take 5 mg by mouth daily.       Marland Kitchen topiramate (TOPAMAX) 25 MG tablet Take 25 mg by mouth daily.      Marland Kitchen topiramate (TOPIRAGEN) 100 MG tablet Take 100 mg by mouth at bedtime.      . traMADol (ULTRAM) 50 MG tablet Take 50 mg by mouth every 6 (six) hours as needed.       . zolpidem (AMBIEN) 10 MG tablet Take 10 mg by mouth at bedtime.        No current facility-administered medications for this visit.     Past Medical History  Diagnosis Date  . Myocardial infarction 1992 & 1998    NM Myocar Multi W/Spect W/Wall Motion EF on 08/07/10 - IMPRESSION: 1) No evidence of ischemia.  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  3) Normal LV wall motion.  4) Estimated Q G S EF 70%, unchanged.  . Asthma   . Blood transfusion   . GERD (gastroesophageal reflux disease)   . Hiatal hernia   . Other forms of migraine, without mention of intractable migraine without mention of status migrainosus   . Dry eye     Chronic  .  Hypercholesteremia   . Spondylolisthesis   . Diverticulosis of colon (without mention of hemorrhage)     Colonoscopy 11/09/09 - FINDINGS: 1) 79mm polp in transverse colon, resected & retrieved.  2) Diverticulosis in sigmoid & descending colon.  3) Medium-sized, internal hemorrhoids.  . Internal hemorrhoids without mention of complication     Colonoscopy 11/09/09 - FINDINGS: 1) 67mm polp in transverse colon, resected & retrieved.  2) Diverticulosis in sigmoid & descending colon.  3) Medium-sized, internal hemorrhoids.  . Benign neoplasm of colon 11/09/09    Colonoscopy 11/09/09 - FINDINGS: 1) 22mm polp in transverse colon, resected & retrieved.  2) Diverticulosis in sigmoid & descending colon.  3) Medium-sized, internal hemorrhoids.  . Hemorrhage of rectum and anus     Colonoscopy 11/09/09 - FINDINGS: 1) 34mm polp in transverse colon, resected & retrieved.  2) Diverticulosis in sigmoid & descending colon.  3) Medium-sized, internal hemorrhoids.  . Essential hypertension, benign   . Esophagitis   . CMC arthritis     Left middle finger  . Depression     40+ years  . Diabetes mellitus 10/2008    type 2  . Breast cancer 1989    Left breast cancer  . Obese   . Syncope     MDT LinQ implanted for evaluation  . Facet arthropathy, lumbar     L3-4  . Obstructive sleep apnea     uses oxygen at night    ROS:   All systems reviewed and negative except as noted in the HPI.   Past Surgical History  Procedure Laterality Date  . Abdominal hysterectomy  1978  . Joint replacement      lsft hip x2, rt knee  . Knee arthroscopy Right 2002    By Dr. Lorin Mercy  . Foot surgery Bilateral     Bil feet great toes and second toes spurs. By Dr. Milinda Pointer in 2004.  . Breast biopsy Left 1995    Biopsy at [facility] ([facility])  . Tonsillectomy    . Back surgery      x2 one for infection  . Cholecystectomy    . Total hip revision  12/14/2010    Procedure: TOTAL HIP REVISION;  Surgeon: Thana Farr  Lorin Mercy;  Location: [place];  Service: Orthopedics;  Laterality: Left;  Left Total Hip Arthroplasty Revision, Poly and Ball Exchange, Possible Acetabular Revision vs. Poly Exchange  . Colonoscopy w/ polypectomy  11/09/09    FINDINGS: 1) 70mm polp in transverse colon, resected & retrieved.  2) Diverticulosis in sigmoid & descending colon.  3) Medium-sized, internal hemorrhoids.  . Loop recorder implant  11/15/06; 04/02/2013    MDT Reveal implanted 10/2006 for syncope; removed with MDT LinQ implanted 03/2013 by Dr Lovena Le  . Hemorrhoid surgery  2011    Internal & external hemorrhoidectomy (x) 3 quadrants  . Esophagogastroduodenoscopy  02/15/07  . Dilation and curettage of uterus    . Trigger finger release Left 01/02/2013    Procedure: RELEASE LEFT MIDDLE TRIGGER FINGER/LEFT INDEX AND LEFT RING FINGER;  Surgeon: Wynonia Sours, MD;  Location: [place];  Service: Orthopedics;  Laterality: Left;  ANESTHESIA: GENERAL/IV REGIONAL FAB     Family History  Problem Relation Age of Onset  . Pancreatic cancer Mother   . Heart attack Father   . Diabetes Son   . Arthritis Brother     Severe/Crippling  . Tuberculosis Brother   . Hypertension Brother     x 2  . Heart disease Sister   . Diabetes Brother   . Seizures Brother   . Autoimmune disease Brother     AIDS  . Colon cancer Brother   . Lung cancer Brother     mets     History   Social History  . Marital Status: Single    Spouse Name: N/A    Number of Children: 2  . Years of Education: college   Occupational History  . Retired    Social History Main Topics  . Smoking status: Former Smoker    Types: Cigarettes    Quit date: 01/19/1983  . Smokeless tobacco: Never Used  . Alcohol Use: No  . Drug Use: No  . Sexual Activity: Not on file   Other Topics Concern  . Not on file   Social History Narrative   Patient is single and lives alone.   Patient has two children.   Patient is retired.   Patient has a college  education.   Patient is right-handed.   Patient does not drink any caffeine.     BP 141/84  Pulse 98  Ht 5' 4.5" (1.638 m)  Wt 220 lb (99.791 kg)  BMI 37.19 kg/m2  Physical Exam:  obese appearing 68 year old woman, NAD HEENT: Unremarkable Neck:  No JVD, no thyromegally Back:  No CVA tenderness Lungs:  Clear with no wheezes, rales, or rhonchi. HEART:  Regular rate rhythm, no murmurs, no rubs, no clicks Abd:  soft, positive bowel sounds, no organomegally, no rebound, no guarding Ext:  2 plus pulses, no edema, no cyanosis, no clubbing Skin:  No rashes no nodules Neuro:  CN II through XII intact, motor grossly intact  Cardiac monitor - sinus rhythm with PVCs   Assess/Plan:

## 2013-07-10 NOTE — Patient Instructions (Signed)
Your physician wants you to follow-up in: 12 months with Dr Knox Saliva will receive a reminder letter in the mail two months in advance. If you don't receive a letter, please call our office to schedule the follow-up appointment.   Remote monitoring is used to monitor your Pacemaker or ICD from home. This monitoring reduces the number of office visits required to check your device to one time per year. It allows [place] to keep an eye on the functioning of your device to ensure it is working properly. You are scheduled for a device check from home on 10/09/13. You may send your transmission at any time that day. If you have a wireless device, the transmission will be sent automatically. After your physician reviews your transmission, you will receive a postcard with your next transmission date.   You have been referred to Dr Dagmar Hait

## 2013-07-13 ENCOUNTER — Ambulatory Visit (INDEPENDENT_AMBULATORY_CARE_PROVIDER_SITE_OTHER): Payer: PRIVATE HEALTH INSURANCE | Admitting: *Deleted

## 2013-07-13 DIAGNOSIS — R55 Syncope and collapse: Secondary | ICD-10-CM

## 2013-07-13 LAB — MDC_IDC_ENUM_SESS_TYPE_INCLINIC

## 2013-07-13 NOTE — Assessment & Plan Note (Signed)
The patient has requested an endocrinologist to help her manage her DM. I have referred her to Dr. Buddy Duty.

## 2013-07-13 NOTE — Assessment & Plan Note (Signed)
No recurrent episodes, s/p insertion of an ILR. Will recheck in several months.

## 2013-07-17 NOTE — Progress Notes (Signed)
Loop recorder 

## 2013-07-23 ENCOUNTER — Other Ambulatory Visit: Payer: Self-pay | Admitting: Gastroenterology

## 2013-07-27 ENCOUNTER — Ambulatory Visit (INDEPENDENT_AMBULATORY_CARE_PROVIDER_SITE_OTHER): Payer: PRIVATE HEALTH INSURANCE | Admitting: Gastroenterology

## 2013-07-27 ENCOUNTER — Encounter: Payer: Self-pay | Admitting: Gastroenterology

## 2013-07-27 VITALS — BP 132/74 | HR 88 | Ht 64.57 in | Wt 215.2 lb

## 2013-07-27 DIAGNOSIS — K3184 Gastroparesis: Secondary | ICD-10-CM

## 2013-07-27 DIAGNOSIS — R1013 Epigastric pain: Secondary | ICD-10-CM

## 2013-07-27 MED ORDER — METOCLOPRAMIDE HCL 10 MG PO TABS: ORAL_TABLET | ORAL | Status: DC

## 2013-07-27 NOTE — Progress Notes (Signed)
          History of Present Illness:  The patient has returned for followup of abdominal pain.  On Reglan pain has significantly improved.  Pain recurs when she is off Reglan.  Even without medication, it is not as nearly severe as it was several months ago.  MRI was ordered but was not completed.    Review of Systems: Pertinent positive and negative review of systems were noted in the above HPI section. All other review of systems were otherwise negative.    Current Medications, Allergies, Past Medical History, Past Surgical History, Family History and Social History were reviewed in [facility] record  Vital signs were reviewed in today's medical record. Physical Exam: General: Well developed , well nourished, no acute distress Abdomen is without masses, bruits or organomegaly.  There is minimal midepigastric tenderness to deep palpation   See Assessment and Plan under Problem List

## 2013-07-27 NOTE — Assessment & Plan Note (Signed)
Epigastric pain clearly has responded to Reglan which suggest that pain is 2 to gastroparesis.  Plan to renew Reglan

## 2013-07-27 NOTE — Assessment & Plan Note (Signed)
Plan to continue Reglan.  Patient was instructed to  contact me immediately  if she develops any side effects from her Reglan including paresthesias, tremors, confusion , weakness or muscle spasms.

## 2013-07-27 NOTE — Patient Instructions (Signed)
Follow up in 4 months We will renew your prescriptions

## 2013-08-09 ENCOUNTER — Encounter: Payer: Self-pay | Admitting: Internal Medicine

## 2013-08-10 ENCOUNTER — Encounter: Payer: Self-pay | Admitting: Internal Medicine

## 2013-08-13 ENCOUNTER — Ambulatory Visit (INDEPENDENT_AMBULATORY_CARE_PROVIDER_SITE_OTHER): Payer: PRIVATE HEALTH INSURANCE | Admitting: *Deleted

## 2013-08-13 DIAGNOSIS — R55 Syncope and collapse: Secondary | ICD-10-CM

## 2013-08-14 ENCOUNTER — Other Ambulatory Visit: Payer: Self-pay | Admitting: Internal Medicine

## 2013-08-14 ENCOUNTER — Other Ambulatory Visit: Payer: Self-pay | Admitting: Neurology

## 2013-08-15 NOTE — Progress Notes (Signed)
Loop recorder 

## 2013-08-21 ENCOUNTER — Ambulatory Visit (INDEPENDENT_AMBULATORY_CARE_PROVIDER_SITE_OTHER): Payer: Medicare Other | Admitting: Podiatry

## 2013-08-21 ENCOUNTER — Encounter: Payer: Self-pay | Admitting: Podiatry

## 2013-08-21 ENCOUNTER — Ambulatory Visit (INDEPENDENT_AMBULATORY_CARE_PROVIDER_SITE_OTHER): Payer: Medicare Other

## 2013-08-21 DIAGNOSIS — M79609 Pain in unspecified limb: Secondary | ICD-10-CM

## 2013-08-21 DIAGNOSIS — E1149 Type 2 diabetes mellitus with other diabetic neurological complication: Secondary | ICD-10-CM

## 2013-08-21 DIAGNOSIS — M778 Other enthesopathies, not elsewhere classified: Secondary | ICD-10-CM

## 2013-08-21 DIAGNOSIS — M79676 Pain in unspecified toe(s): Secondary | ICD-10-CM

## 2013-08-21 DIAGNOSIS — M779 Enthesopathy, unspecified: Secondary | ICD-10-CM

## 2013-08-21 DIAGNOSIS — B351 Tinea unguium: Secondary | ICD-10-CM

## 2013-08-21 DIAGNOSIS — M7750 Other enthesopathy of unspecified foot: Secondary | ICD-10-CM

## 2013-08-21 NOTE — Patient Instructions (Signed)
Diabetes and Foot Care Diabetes may cause you to have problems because of poor blood supply (circulation) to your feet and legs. This may cause the skin on your feet to become thinner, break easier, and heal more slowly. Your skin may become dry, and the skin may peel and crack. You may also have nerve damage in your legs and feet causing decreased feeling in them. You may not notice minor injuries to your feet that could lead to infections or more serious problems. Taking care of your feet is one of the most important things you can do for yourself.  HOME CARE INSTRUCTIONS  Wear shoes at all times, even in the house. Do not go barefoot. Bare feet are easily injured.  Check your feet daily for blisters, cuts, and redness. If you cannot see the bottom of your feet, use a mirror or ask someone for help.  Wash your feet with warm water (do not use hot water) and mild soap. Then pat your feet and the areas between your toes until they are completely dry. Do not soak your feet as this can dry your skin.  Apply a moisturizing lotion or petroleum jelly (that does not contain alcohol and is unscented) to the skin on your feet and to dry, brittle toenails. Do not apply lotion between your toes.  Trim your toenails straight across. Do not dig under them or around the cuticle. File the edges of your nails with an emery board or nail file.  Do not cut corns or calluses or try to remove them with medicine.  Wear clean socks or stockings every day. Make sure they are not too tight. Do not wear knee-high stockings since they may decrease blood flow to your legs.  Wear shoes that fit properly and have enough cushioning. To break in new shoes, wear them for just a few hours a day. This prevents you from injuring your feet. Always look in your shoes before you put them on to be sure there are no objects inside.  Do not cross your legs. This may decrease the blood flow to your feet.  If you find a minor scrape,  cut, or break in the skin on your feet, keep it and the skin around it clean and dry. These areas may be cleansed with mild soap and water. Do not cleanse the area with peroxide, alcohol, or iodine.  When you remove an adhesive bandage, be sure not to damage the skin around it.  If you have a wound, look at it several times a day to make sure it is healing.  Do not use heating pads or hot water bottles. They may burn your skin. If you have lost feeling in your feet or legs, you may not know it is happening until it is too late.  Make sure your health care provider performs a complete foot exam at least annually or more often if you have foot problems. Report any cuts, sores, or bruises to your health care provider immediately. SEEK MEDICAL CARE IF:   You have an injury that is not healing.  You have cuts or breaks in the skin.  You have an ingrown nail.  You notice redness on your legs or feet.  You feel burning or tingling in your legs or feet.  You have pain or cramps in your legs and feet.  Your legs or feet are numb.  Your feet always feel cold. SEEK IMMEDIATE MEDICAL CARE IF:   There is increasing redness,   swelling, or pain in or around a wound.  There is a red line that goes up your leg.  Pus is coming from a wound.  You develop a fever or as directed by your health care provider.  You notice a bad smell coming from an ulcer or wound. Document Released: 01/02/2000 Document Revised: 09/06/2012 Document Reviewed: 06/13/2012 Exit[employer] Patient Information 2015 [employer]. This information is not intended to replace advice given to you by your health care provider. Make sure you discuss any questions you have with your health care provider.  

## 2013-08-21 NOTE — Progress Notes (Signed)
She presents today complaining of painful elongated toenails one through 5 bilateral. She's also complaining of pain about the first metatarsophalangeal joint of the bilateral foot. States has been going on for quite some time now its my tablets to take a look at them. States that she has had a fall and has hurt her right foot.  Objective: Vital signs are stable she is alert and oriented x3. I reviewed her past mental history medications allergies surgeries social history. Pulses are strongly palpable bilateral. She has tenderness on palpation of the first metatarsophalangeal joints bilaterally both of these contained semiflexible silicone implants. Radiographic evaluation confirms spurring surrounding these implants. Her nails are thick yellow dystrophic onychomycotic and painful palpation.  Assessment: Capsulitis first metatarsophalangeal joints bilateral. Pain in limb secondary to onychomycosis 1 through 5 bilateral.  Plan: Injected periarticularly about the first metatarsophalangeal joint with Kenalog and local anesthetic. Debridement nails 1 through 5 bilateral covered service secondary to pain.

## 2013-08-27 ENCOUNTER — Telehealth: Payer: Self-pay | Admitting: *Deleted

## 2013-08-27 NOTE — Telephone Encounter (Signed)
I called and informed the patient that she has an appointment scheduled for 09/06/2013, arrival time 11:10am.  You will be seeing Dr. Harriett Sine.  I informed her that they said insurance may not cover hair thinning it depends on the diagnosis but most likely it will cover the thinning of the nails.  She stated okay thank you.

## 2013-08-27 NOTE — Telephone Encounter (Signed)
Returning your call.  I told her we need to schedule the patient an appointment for hair thinning and thinning of nails.  I gave her all pertinent information.  She scheduled the patient an appointment for 09/06/2013 at 11:30am.  She stated that the patient's insurances may not cover hair loss and she will have to sign a waiver stating that if not she will be responsible for the bill. She asked me to inform the patient.   I will call the patient and inform her of the appointment.

## 2013-08-27 NOTE — Telephone Encounter (Signed)
I attempted to call and schedule patient an appointment due to thinning hair and nails.  I left a message of pertinent information and asked them to give me a call back.

## 2013-09-05 ENCOUNTER — Encounter: Payer: Self-pay | Admitting: Internal Medicine

## 2013-09-06 ENCOUNTER — Encounter: Payer: Self-pay | Admitting: Internal Medicine

## 2013-09-07 ENCOUNTER — Encounter: Payer: Self-pay | Admitting: Internal Medicine

## 2013-09-07 LAB — MDC_IDC_ENUM_SESS_TYPE_REMOTE: Date Time Interrogation Session: 20150726040500

## 2013-09-11 ENCOUNTER — Encounter: Payer: Self-pay | Admitting: Internal Medicine

## 2013-09-11 ENCOUNTER — Ambulatory Visit (INDEPENDENT_AMBULATORY_CARE_PROVIDER_SITE_OTHER): Payer: PRIVATE HEALTH INSURANCE | Admitting: *Deleted

## 2013-09-11 DIAGNOSIS — R55 Syncope and collapse: Secondary | ICD-10-CM

## 2013-09-12 ENCOUNTER — Encounter: Payer: Self-pay | Admitting: Gastroenterology

## 2013-09-20 NOTE — Progress Notes (Signed)
Loop recorder 

## 2013-09-24 ENCOUNTER — Encounter: Payer: Self-pay | Admitting: Internal Medicine

## 2013-09-27 ENCOUNTER — Encounter: Payer: Self-pay | Admitting: Internal Medicine

## 2013-09-28 LAB — MDC_IDC_ENUM_SESS_TYPE_REMOTE: Date Time Interrogation Session: 20150905040500

## 2013-10-02 LAB — MDC_IDC_ENUM_SESS_TYPE_REMOTE: MDC IDC SESS DTM: 20150825040500

## 2013-10-08 ENCOUNTER — Other Ambulatory Visit: Payer: Self-pay

## 2013-10-08 DIAGNOSIS — Z1231 Encounter for screening mammogram for malignant neoplasm of breast: Secondary | ICD-10-CM

## 2013-10-10 ENCOUNTER — Ambulatory Visit (INDEPENDENT_AMBULATORY_CARE_PROVIDER_SITE_OTHER): Payer: PRIVATE HEALTH INSURANCE | Admitting: *Deleted

## 2013-10-10 DIAGNOSIS — R55 Syncope and collapse: Secondary | ICD-10-CM

## 2013-10-12 ENCOUNTER — Encounter: Payer: Self-pay | Admitting: Internal Medicine

## 2013-10-16 LAB — MDC_IDC_ENUM_SESS_TYPE_REMOTE
Date Time Interrogation Session: 20150923094803
MDC IDC SET ZONE DETECTION INTERVAL: 2000 ms
MDC IDC SET ZONE DETECTION INTERVAL: 3000 ms
Zone Setting Detection Interval: 360 ms

## 2013-10-26 NOTE — Progress Notes (Signed)
Loop recorder 

## 2013-10-29 ENCOUNTER — Encounter: Payer: Self-pay | Admitting: Internal Medicine

## 2013-11-06 ENCOUNTER — Other Ambulatory Visit: Payer: Self-pay | Admitting: Gastroenterology

## 2013-11-09 ENCOUNTER — Ambulatory Visit (INDEPENDENT_AMBULATORY_CARE_PROVIDER_SITE_OTHER): Payer: PRIVATE HEALTH INSURANCE | Admitting: *Deleted

## 2013-11-09 ENCOUNTER — Encounter: Payer: Self-pay | Admitting: Internal Medicine

## 2013-11-09 DIAGNOSIS — R55 Syncope and collapse: Secondary | ICD-10-CM

## 2013-11-12 ENCOUNTER — Ambulatory Visit
Admission: RE | Admit: 2013-11-12 | Discharge: 2013-11-12 | Disposition: A | Discharge status: Home / Self Care | Payer: PRIVATE HEALTH INSURANCE | Source: Ambulatory Visit

## 2013-11-12 DIAGNOSIS — Z1231 Encounter for screening mammogram for malignant neoplasm of breast: Secondary | ICD-10-CM

## 2013-11-16 NOTE — Progress Notes (Signed)
Loop recorder 

## 2013-11-22 ENCOUNTER — Encounter: Payer: Self-pay | Admitting: Internal Medicine

## 2013-11-22 LAB — MDC_IDC_ENUM_SESS_TYPE_REMOTE: Date Time Interrogation Session: 20151025040500

## 2013-11-26 ENCOUNTER — Ambulatory Visit: Payer: PRIVATE HEALTH INSURANCE | Admitting: Internal Medicine

## 2013-11-27 ENCOUNTER — Ambulatory Visit (INDEPENDENT_AMBULATORY_CARE_PROVIDER_SITE_OTHER): Payer: Medicare Other | Admitting: Podiatry

## 2013-11-27 ENCOUNTER — Encounter: Payer: Self-pay | Admitting: Podiatry

## 2013-11-27 DIAGNOSIS — B351 Tinea unguium: Secondary | ICD-10-CM

## 2013-11-27 DIAGNOSIS — M79673 Pain in unspecified foot: Secondary | ICD-10-CM

## 2013-11-27 NOTE — Progress Notes (Signed)
   Subjective:    Patient ID: [name], female    DOB: 07-Jun-1945, 68 y.o.   MRN: [id number]  HPI Pt presents for nail debridment   Review of Systems     Objective:   Physical Exam: Pulses are palpable bilateral. Nails are thick yellow dystrophic onychomycotic and painful palpation.        Assessment & Plan:  Assessment: Pain and limb secondary to onychomycosis 1 through 5 bilateral.  Plan: Debridement of nails 1 through 5 bilateral.

## 2013-12-05 ENCOUNTER — Encounter: Payer: Self-pay | Admitting: Internal Medicine

## 2013-12-10 ENCOUNTER — Ambulatory Visit (INDEPENDENT_AMBULATORY_CARE_PROVIDER_SITE_OTHER): Payer: PRIVATE HEALTH INSURANCE | Admitting: *Deleted

## 2013-12-10 ENCOUNTER — Encounter: Payer: Self-pay | Admitting: Internal Medicine

## 2013-12-10 DIAGNOSIS — R55 Syncope and collapse: Secondary | ICD-10-CM

## 2013-12-10 LAB — MDC_IDC_ENUM_SESS_TYPE_REMOTE: MDC IDC SESS DTM: 20151115050500

## 2013-12-14 ENCOUNTER — Other Ambulatory Visit: Payer: Self-pay | Admitting: Gastroenterology

## 2013-12-19 NOTE — Progress Notes (Signed)
Loop recorder 

## 2013-12-20 ENCOUNTER — Encounter: Payer: Self-pay | Admitting: Internal Medicine

## 2013-12-21 ENCOUNTER — Other Ambulatory Visit: Payer: Self-pay

## 2013-12-21 ENCOUNTER — Observation Stay (HOSPITAL_COMMUNITY)
Admission: EM | Admit: 2013-12-21 | Discharge: 2013-12-23 | Disposition: A | Discharge status: Home / Self Care | Payer: PRIVATE HEALTH INSURANCE | Attending: Internal Medicine | Admitting: Internal Medicine

## 2013-12-21 ENCOUNTER — Emergency Department (HOSPITAL_COMMUNITY): Payer: PRIVATE HEALTH INSURANCE

## 2013-12-21 ENCOUNTER — Encounter (HOSPITAL_COMMUNITY): Payer: Self-pay | Admitting: Family Medicine

## 2013-12-21 DIAGNOSIS — K5730 Diverticulosis of large intestine without perforation or abscess without bleeding: Secondary | ICD-10-CM | POA: Diagnosis not present

## 2013-12-21 DIAGNOSIS — M1990 Unspecified osteoarthritis, unspecified site: Secondary | ICD-10-CM | POA: Insufficient documentation

## 2013-12-21 DIAGNOSIS — M4310 Spondylolisthesis, site unspecified: Secondary | ICD-10-CM | POA: Insufficient documentation

## 2013-12-21 DIAGNOSIS — Z85038 Personal history of other malignant neoplasm of large intestine: Secondary | ICD-10-CM | POA: Insufficient documentation

## 2013-12-21 DIAGNOSIS — I10 Essential (primary) hypertension: Secondary | ICD-10-CM | POA: Diagnosis not present

## 2013-12-21 DIAGNOSIS — I252 Old myocardial infarction: Secondary | ICD-10-CM | POA: Diagnosis not present

## 2013-12-21 DIAGNOSIS — Z9981 Dependence on supplemental oxygen: Secondary | ICD-10-CM | POA: Diagnosis not present

## 2013-12-21 DIAGNOSIS — J45901 Unspecified asthma with (acute) exacerbation: Secondary | ICD-10-CM | POA: Insufficient documentation

## 2013-12-21 DIAGNOSIS — E1149 Type 2 diabetes mellitus with other diabetic neurological complication: Secondary | ICD-10-CM

## 2013-12-21 DIAGNOSIS — G43809 Other migraine, not intractable, without status migrainosus: Secondary | ICD-10-CM | POA: Diagnosis not present

## 2013-12-21 DIAGNOSIS — Z8719 Personal history of other diseases of the digestive system: Secondary | ICD-10-CM | POA: Insufficient documentation

## 2013-12-21 DIAGNOSIS — E785 Hyperlipidemia, unspecified: Secondary | ICD-10-CM | POA: Diagnosis present

## 2013-12-21 DIAGNOSIS — E780 Pure hypercholesterolemia: Secondary | ICD-10-CM | POA: Diagnosis not present

## 2013-12-21 DIAGNOSIS — Z79899 Other long term (current) drug therapy: Secondary | ICD-10-CM | POA: Insufficient documentation

## 2013-12-21 DIAGNOSIS — K219 Gastro-esophageal reflux disease without esophagitis: Secondary | ICD-10-CM | POA: Diagnosis not present

## 2013-12-21 DIAGNOSIS — Z87891 Personal history of nicotine dependence: Secondary | ICD-10-CM | POA: Diagnosis not present

## 2013-12-21 DIAGNOSIS — R079 Chest pain, unspecified: Secondary | ICD-10-CM | POA: Diagnosis present

## 2013-12-21 DIAGNOSIS — K209 Esophagitis, unspecified: Secondary | ICD-10-CM | POA: Insufficient documentation

## 2013-12-21 DIAGNOSIS — G4733 Obstructive sleep apnea (adult) (pediatric): Secondary | ICD-10-CM | POA: Diagnosis not present

## 2013-12-21 DIAGNOSIS — Z7901 Long term (current) use of anticoagulants: Secondary | ICD-10-CM | POA: Diagnosis not present

## 2013-12-21 DIAGNOSIS — F329 Major depressive disorder, single episode, unspecified: Secondary | ICD-10-CM | POA: Diagnosis not present

## 2013-12-21 DIAGNOSIS — Z853 Personal history of malignant neoplasm of breast: Secondary | ICD-10-CM | POA: Diagnosis not present

## 2013-12-21 DIAGNOSIS — E119 Type 2 diabetes mellitus without complications: Secondary | ICD-10-CM | POA: Insufficient documentation

## 2013-12-21 DIAGNOSIS — E669 Obesity, unspecified: Secondary | ICD-10-CM | POA: Insufficient documentation

## 2013-12-21 DIAGNOSIS — I200 Unstable angina: Secondary | ICD-10-CM | POA: Diagnosis not present

## 2013-12-21 LAB — BASIC METABOLIC PANEL
ANION GAP: 14 (ref 5–15)
BUN: 8 mg/dL (ref 6–23)
CO2: 24 meq/L (ref 19–32)
Calcium: 9.7 mg/dL (ref 8.4–10.5)
Chloride: 107 mEq/L (ref 96–112)
Creatinine, Ser: 0.83 mg/dL (ref 0.50–1.10)
GFR calc non Af Amer: 72 mL/min — ABNORMAL LOW (ref >90)
GFR, EST AFRICAN AMERICAN: 83 mL/min — AB (ref >90)
Glucose, Bld: 79 mg/dL (ref 70–99)
Potassium: 4.3 mEq/L (ref 3.7–5.3)
Sodium: 145 mEq/L (ref 137–147)

## 2013-12-21 LAB — CBC
HCT: 37.3 % (ref 36.0–46.0)
Hemoglobin: 11.7 g/dL — ABNORMAL LOW (ref 12.0–15.0)
MCH: 26.8 pg (ref 26.0–34.0)
MCHC: 31.4 g/dL (ref 30.0–36.0)
MCV: 85.6 fL (ref 78.0–100.0)
Platelets: 336 10*3/uL (ref 150–400)
RBC: 4.36 MIL/uL (ref 3.87–5.11)
RDW: 15.3 % (ref 11.5–15.5)
WBC: 9.9 10*3/uL (ref 4.0–10.5)

## 2013-12-21 LAB — GLUCOSE, CAPILLARY
GLUCOSE-CAPILLARY: 101 mg/dL — AB (ref 70–99)
GLUCOSE-CAPILLARY: 162 mg/dL — AB (ref 70–99)

## 2013-12-21 LAB — TROPONIN I: Troponin I: <0.3 ng/mL (ref <0.30)

## 2013-12-21 LAB — PRO B NATRIURETIC PEPTIDE: Pro B Natriuretic peptide (BNP): 55.7 pg/mL (ref 0–125)

## 2013-12-21 MED ORDER — OLOPATADINE HCL 0.1 % OP SOLN
1.0000 [drp] | Freq: Two times a day (BID) | OPHTHALMIC | Status: DC
  Administered 2013-12-21: 1 [drp] via OPHTHALMIC
  Filled 2013-12-21: qty 5

## 2013-12-21 MED ORDER — ATORVASTATIN CALCIUM 10 MG PO TABS
10.0000 mg | ORAL_TABLET | Freq: Every day | ORAL | Status: DC
  Administered 2013-12-22 – 2013-12-23 (×2): 10 mg via ORAL
  Filled 2013-12-21: qty 1

## 2013-12-21 MED ORDER — TOPIRAMATE 100 MG PO TABS
100.0000 mg | ORAL_TABLET | Freq: Every evening | ORAL | Status: DC
  Administered 2013-12-21 – 2013-12-22 (×2): 100 mg via ORAL
  Filled 2013-12-21 (×3): qty 1

## 2013-12-21 MED ORDER — HYOSCYAMINE SULFATE ER 0.375 MG PO TB12
0.3750 mg | ORAL_TABLET | Freq: Two times a day (BID) | ORAL | Status: DC
  Administered 2013-12-21 – 2013-12-23 (×4): 0.375 mg via ORAL
  Filled 2013-12-21 (×5): qty 1

## 2013-12-21 MED ORDER — INSULIN ASPART 100 UNIT/ML ~~LOC~~ SOLN
0.0000 [IU] | Freq: Three times a day (TID) | SUBCUTANEOUS | Status: DC
  Administered 2013-12-22: 2 [IU] via SUBCUTANEOUS
  Administered 2013-12-22: 3 [IU] via SUBCUTANEOUS
  Administered 2013-12-23: 2 [IU] via SUBCUTANEOUS

## 2013-12-21 MED ORDER — TOPIRAMATE 25 MG PO TABS
25.0000 mg | ORAL_TABLET | Freq: Every morning | ORAL | Status: DC
  Administered 2013-12-22 – 2013-12-23 (×2): 25 mg via ORAL
  Filled 2013-12-21 (×2): qty 1

## 2013-12-21 MED ORDER — CYCLOSPORINE 0.05 % OP EMUL
1.0000 [drp] | Freq: Two times a day (BID) | OPHTHALMIC | Status: DC
  Administered 2013-12-21: 1 [drp] via OPHTHALMIC
  Filled 2013-12-21 (×4): qty 1

## 2013-12-21 MED ORDER — METOCLOPRAMIDE HCL 10 MG PO TABS
10.0000 mg | ORAL_TABLET | Freq: Three times a day (TID) | ORAL | Status: DC
  Administered 2013-12-22 – 2013-12-23 (×4): 10 mg via ORAL
  Filled 2013-12-21 (×2): qty 1

## 2013-12-21 MED ORDER — CLOPIDOGREL BISULFATE 75 MG PO TABS
75.0000 mg | ORAL_TABLET | Freq: Every day | ORAL | Status: DC
  Administered 2013-12-22 – 2013-12-23 (×2): 75 mg via ORAL
  Filled 2013-12-21: qty 1

## 2013-12-21 MED ORDER — NITROGLYCERIN 0.4 MG SL SUBL
0.4000 mg | SUBLINGUAL_TABLET | SUBLINGUAL | Status: DC | PRN
  Administered 2013-12-22: 0.4 mg via SUBLINGUAL

## 2013-12-21 MED ORDER — ZOLPIDEM TARTRATE 5 MG PO TABS
5.0000 mg | ORAL_TABLET | Freq: Every day | ORAL | Status: DC
  Administered 2013-12-21 – 2013-12-22 (×2): 5 mg via ORAL
  Filled 2013-12-21: qty 1

## 2013-12-21 MED ORDER — NITROGLYCERIN IN D5W 200-5 MCG/ML-% IV SOLN
10.0000 ug/min | INTRAVENOUS | Status: AC
  Administered 2013-12-21: 10 ug/min via INTRAVENOUS

## 2013-12-21 MED ORDER — MORPHINE SULFATE 4 MG/ML IJ SOLN
4.0000 mg | Freq: Once | INTRAMUSCULAR | Status: AC
Start: 2013-12-21 — End: 2013-12-21
  Administered 2013-12-21: 4 mg via INTRAVENOUS

## 2013-12-21 MED ORDER — ALBUTEROL SULFATE (2.5 MG/3ML) 0.083% IN NEBU: 2.5000 mg | INHALATION_SOLUTION | RESPIRATORY_TRACT | Status: DC | PRN

## 2013-12-21 MED ORDER — ACETAMINOPHEN 325 MG PO TABS: 650.0000 mg | ORAL_TABLET | ORAL | Status: DC | PRN

## 2013-12-21 MED ORDER — LOSARTAN POTASSIUM 50 MG PO TABS
100.0000 mg | ORAL_TABLET | Freq: Every day | ORAL | Status: DC
  Administered 2013-12-22 – 2013-12-23 (×2): 100 mg via ORAL
  Filled 2013-12-21: qty 2

## 2013-12-21 MED ORDER — PANTOPRAZOLE SODIUM 40 MG PO TBEC
40.0000 mg | DELAYED_RELEASE_TABLET | Freq: Every day | ORAL | Status: DC
  Administered 2013-12-22 – 2013-12-23 (×2): 40 mg via ORAL
  Filled 2013-12-21: qty 1

## 2013-12-21 MED ORDER — GABAPENTIN 100 MG PO CAPS
100.0000 mg | ORAL_CAPSULE | Freq: Every day | ORAL | Status: DC
  Administered 2013-12-21 – 2013-12-22 (×2): 100 mg via ORAL
  Filled 2013-12-21: qty 1

## 2013-12-21 MED ORDER — DILTIAZEM HCL ER COATED BEADS 180 MG PO CP24
180.0000 mg | ORAL_CAPSULE | Freq: Every day | ORAL | Status: DC
  Administered 2013-12-22 – 2013-12-23 (×2): 180 mg via ORAL
  Filled 2013-12-21: qty 1

## 2013-12-21 MED ORDER — HYDROCODONE-ACETAMINOPHEN 5-325 MG PO TABS
1.0000 | ORAL_TABLET | ORAL | Status: DC | PRN
  Administered 2013-12-22 (×2): 1 via ORAL
  Filled 2013-12-21: qty 1

## 2013-12-21 MED ORDER — ONDANSETRON HCL 4 MG/2ML IJ SOLN: 4.0000 mg | Freq: Four times a day (QID) | INTRAMUSCULAR | Status: DC | PRN

## 2013-12-21 MED ORDER — TRAMADOL HCL 50 MG PO TABS: 50.0000 mg | ORAL_TABLET | Freq: Four times a day (QID) | ORAL | Status: DC | PRN

## 2013-12-21 MED ORDER — AMITRIPTYLINE HCL 25 MG PO TABS
25.0000 mg | ORAL_TABLET | Freq: Every day | ORAL | Status: DC
  Administered 2013-12-21 – 2013-12-22 (×2): 25 mg via ORAL
  Filled 2013-12-21: qty 1

## 2013-12-21 NOTE — H&P (Signed)
Cardiologist:  [name] is an 68 y.o. female.   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 According to the patient she has had four MIs.  The last one in 1998.  Coronaries were clean.   She was unable to walk and underwent stress perfusion imaging in Oct 2014 which was unremarkable. She also wore a cardiac monitor which demonstrated PACs and PVCs but no sustained arrhythmias. She continues to have episodes of nonexertional chest pain. 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 She had another episode of syncope which occurred at church while standing. On awakening she felt like her heart was beating fast. She eventually improved but thought that she was out for less than a minute, maybe only a few seconds.  She underwent a new loop recorder implant on 04/02/13 for due to new unexplained syncope.   She presents today with CP that started four days ago.  She's had one to two episodes each day.  It is associated with left arm pain, tingling and SOB.  No N, V, diaphoresis.  Worse with exertion.  Better with rest and NTG spray.  At its worst the pain was 10/10(today).  The patient currently denies fever, orthopnea, dizziness, PND, cough, congestion, abdominal pain, hematochezia, melena, lower extremity edema.  She has an extreme contrast allergy.       Medications:  Prior to Admission medications   Medication Sig Start Date End Date Taking? Authorizing Provider  amitriptyline (ELAVIL) 25 MG tablet Take 1 tablet (25 mg total) by mouth at bedtime. 02/07/13  Yes Carmen Dohmeier, MD  atorvastatin (LIPITOR) 10 MG tablet Take 10 mg by mouth daily.    Yes Historical Provider, MD  cholecalciferol (VITAMIN D) 1000 UNITS tablet Take 1,000 Units by mouth daily.   Yes Historical Provider, MD  clopidogrel (PLAVIX) 75 MG tablet Take 75 mg by mouth daily.  07/20/12  Yes  Historical Provider, MD  cycloSPORINE (RESTASIS) 0.05 % ophthalmic emulsion Place 1 drop into both eyes 2 (two) times daily.    Yes Historical Provider, MD  DEXILANT 60 MG capsule Take 1 capsule by mouth daily.  01/30/13  Yes Historical Provider, MD  diazepam (VALIUM) 5 MG tablet Take 5 mg by mouth 2 (two) times daily. For muscle spasms   Yes Historical Provider, MD  diclofenac sodium (VOLTAREN) 1 % GEL Apply 1 application topically daily as needed.   Yes Historical Provider, MD  diltiazem (CARDIZEM CD) 180 MG 24 hr capsule TAKE 1 CAPSULE BY MOUTH ONCE DAILY   Yes Evans Lance, MD  diphenhydrAMINE (BENADRYL) 25 mg capsule Take 25 mg by mouth at bedtime.   Yes Historical Provider, MD  gabapentin (NEURONTIN) 100 MG capsule Take 1 capsule by mouth at bedtime.  06/18/13  Yes Historical Provider, MD  glimepiride (AMARYL) 2 MG tablet Take 1 tablet (2 mg total) by mouth 2 (two) times daily. 12/15/10  Yes Epimenio Foot, PA-C  HYDROcodone-acetaminophen (NORCO/VICODIN) 5-325 MG per tablet Take 1 tablet by mouth every 4 (four) hours as needed for moderate pain. 03/22/13  Yes Hoy Morn, MD  hyoscyamine (LEVBID) 0.375 MG 12 hr tablet Take 0.375 mg by mouth 2 (two) times daily.   Yes Historical Provider, MD  isosorbide mononitrate (IMDUR) 60 MG 24 hr tablet Take 60 mg by mouth 2 (two) times daily.  Yes Historical Provider, MD  losartan (COZAAR) 100 MG tablet Take 100 mg by mouth daily.  04/23/13  Yes Historical Provider, MD  metFORMIN (GLUCOPHAGE) 1000 MG tablet Take 1 tablet (1,000 mg total) by mouth 2 (two) times daily with a meal. 12/15/10  Yes Epimenio Foot, PA-C  metoCLOPramide (REGLAN) 10 MG tablet Take one tab before each meal and at bedtime 07/27/13  Yes Inda Castle, MD  nitroGLYCERIN (NITROLINGUAL) 0.4 MG/SPRAY spray Place 1 spray under the tongue every 5 (five) minutes as needed. Chest pains     Yes Historical Provider, MD  Olopatadine HCl (PATADAY) 0.2 % SOLN Place 1 drop into both eyes daily.     Yes Historical Provider, MD  OVER THE COUNTER MEDICATION Take 1 capsule by mouth daily. Hair skin and Nail   Yes Historical Provider, MD  PROAIR HFA 108 (90 BASE) MCG/ACT inhaler Inhale 2 puffs into the lungs every 4 (four) hours as needed for shortness of breath.  07/20/12  Yes Historical Provider, MD  saxagliptin HCl (ONGLYZA) 5 MG TABS tablet Take 5 mg by mouth daily.    Yes Historical Provider, MD  topiramate (TOPAMAX) 100 MG tablet TAKE 1 TABLET BY MOUTH EVERY EVENING. 08/14/13  Yes Carmen Dohmeier, MD  topiramate (TOPAMAX) 25 MG tablet TAKE 1 TABLET BY MOUTH EVERY MORNING. 08/14/13  Yes Asencion Partridge Dohmeier, MD  traMADol (ULTRAM) 50 MG tablet Take 50 mg by mouth every 6 (six) hours as needed.  07/25/12  Yes Historical Provider, MD  zolpidem (AMBIEN) 10 MG tablet Take 10 mg by mouth at bedtime.  06/21/12  Yes Historical Provider, MD  metoCLOPramide (REGLAN) 10 MG tablet TAKE 1 TABLET BY MOUTH BEFORE [facility] MEAL AND AT BEDTIME Patient not taking: Reported on 12/21/2013 12/17/13   Inda Castle, MD    Past Medical History  Diagnosis Date  . Myocardial infarction 1992 & 1998    NM Myocar Multi W/Spect W/Wall Motion EF on 08/07/10 - IMPRESSION: 1) No evidence of ischemia.  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  3) Normal LV wall motion.  4) Estimated Q G S EF 70%, unchanged.  . Asthma   . Blood transfusion   . GERD (gastroesophageal reflux disease)   . Hiatal hernia   . Other forms of migraine, without mention of intractable migraine without mention of status migrainosus   . Dry eye     Chronic  . Hypercholesteremia   . Spondylolisthesis   . Diverticulosis of colon (without mention of hemorrhage)     Colonoscopy 11/09/09 - FINDINGS: 1) 20m polp in transverse colon, resected & retrieved.  2) Diverticulosis in sigmoid & descending colon.  3) Medium-sized, internal hemorrhoids.  . Internal hemorrhoids without mention of complication     Colonoscopy  11/09/09 - FINDINGS: 1) 14mpolp in transverse colon, resected & retrieved.  2) Diverticulosis in sigmoid & descending colon.  3) Medium-sized, internal hemorrhoids.  . Benign neoplasm of colon 11/09/09    Colonoscopy 11/09/09 - FINDINGS: 1) 1083molp in transverse colon, resected & retrieved.  2) Diverticulosis in sigmoid & descending colon.  3) Medium-sized, internal hemorrhoids.  . Hemorrhage of rectum and anus     Colonoscopy 11/09/09 - FINDINGS: 1) 44m50mlp in transverse colon, resected & retrieved.  2) Diverticulosis in sigmoid & descending colon.  3) Medium-sized, internal hemorrhoids.  . Essential hypertension, benign   . Esophagitis   . CMC arthritis     Left middle finger  .  Depression     40+ years  . Diabetes mellitus 10/2008    type 2  . Breast cancer 1989    Left breast cancer  . Obese   . Syncope     MDT LinQ implanted for evaluation  . Facet arthropathy, lumbar     L3-4  . Obstructive sleep apnea     uses oxygen at night    Past Surgical History  Procedure Laterality Date  . Abdominal hysterectomy  1978  . Joint replacement      lsft hip x2, rt knee  . Knee arthroscopy Right 2002    By Dr. Lorin Mercy  . Foot surgery Bilateral     Bil feet great toes and second toes spurs. By Dr. Milinda Pointer in 2004.  . Breast biopsy Left 1995    Biopsy at [facility] ([facility])  . Tonsillectomy    . Back surgery      x2 one for infection  . Cholecystectomy    . Total hip revision  12/14/2010    Procedure: TOTAL HIP REVISION;  Surgeon: Marybelle Killings;  Location: [place];  Service: Orthopedics;  Laterality: Left;  Left Total Hip Arthroplasty Revision, Poly and Ball Exchange, Possible Acetabular Revision vs. Poly Exchange  . Colonoscopy w/ polypectomy  11/09/09    FINDINGS: 1) 6m polp in transverse colon, resected & retrieved.  2) Diverticulosis in sigmoid & descending colon.  3) Medium-sized, internal hemorrhoids.  . Loop recorder implant  11/15/06; 04/02/2013    MDT Reveal  implanted 10/2006 for syncope; removed with MDT LinQ implanted 03/2013 by Dr TLovena Le . Hemorrhoid surgery  2011    Internal & external hemorrhoidectomy (x) 3 quadrants  . Esophagogastroduodenoscopy  02/15/07  . Dilation and curettage of uterus    . Trigger finger release Left 01/02/2013    Procedure: RELEASE LEFT MIDDLE TRIGGER FINGER/LEFT INDEX AND LEFT RING FINGER;  Surgeon: GWynonia Sours MD;  [place]  Service: Orthopedics;  Laterality: Left;  ANESTHESIA: GENERAL/IV REGIONAL FAB    Family History  Problem Relation Age of Onset  . Pancreatic cancer Mother   . Heart attack Father   . Diabetes Son   . Arthritis Brother     Severe/Crippling  . Tuberculosis Brother   . Hypertension Brother     x 2  . Heart disease Sister   . Diabetes Brother   . Seizures Brother   . Autoimmune disease Brother     AIDS  . Colon cancer Brother   . Lung cancer Brother     mets   Social History:  reports that she quit smoking about 30 years ago. Her smoking use included Cigarettes. She smoked 0.00 packs per day. She has never used smokeless tobacco. She reports that she does not drink alcohol or use illicit drugs.  Allergies:  Allergies  Allergen Reactions  . Contrast Media [Iodinated Diagnostic Agents] Anaphylaxis  . Gadolinium Anaphylaxis       . Aspirin Nausea And Vomiting    Fast heart rate  . Iohexol Other (See Comments)    unknown     (Not in a hospital admission)  Results for orders placed or performed during the hospital encounter of 12/21/13 (from the past 48 hour(s))  CBC     Status: Abnormal   Collection Time: 12/21/13 12:41 PM  Result Value Ref Range   WBC 9.9 4.0 - 10.5 K/uL   RBC 4.36 3.87 - 5.11 MIL/uL   Hemoglobin 11.7 (L) 12.0 -  15.0 g/dL   HCT 37.3 36.0 - 46.0 %   MCV 85.6 78.0 - 100.0 fL   MCH 26.8 26.0 - 34.0 pg   MCHC 31.4 30.0 - 36.0 g/dL   RDW 15.3 11.5 - 15.5 %   Platelets 336 150 - 400 K/uL  Basic metabolic panel     Status:  Abnormal   Collection Time: 12/21/13 12:41 PM  Result Value Ref Range   Sodium 145 137 - 147 mEq/L   Potassium 4.3 3.7 - 5.3 mEq/L   Chloride 107 96 - 112 mEq/L   CO2 24 19 - 32 mEq/L   Glucose, Bld 79 70 - 99 mg/dL   BUN 8 6 - 23 mg/dL   Creatinine, Ser 0.83 0.50 - 1.10 mg/dL   Calcium 9.7 8.4 - 10.5 mg/dL   GFR calc non Af Amer 72 (L) >90 mL/min   GFR calc Af Amer 83 (L) >90 mL/min    Comment: (NOTE) The eGFR has been calculated using the CKD EPI equation. This calculation has not been validated in all clinical situations. eGFR's persistently <90 mL/min signify possible Chronic Kidney Disease.    Anion gap 14 5 - 15  BNP (order ONLY if patient complains of dyspnea/SOB AND you have documented it for THIS visit)     Status: None   Collection Time: 12/21/13 12:41 PM  Result Value Ref Range   Pro B Natriuretic peptide (BNP) 55.7 0 - 125 pg/mL  Troponin I     Status: None   Collection Time: 12/21/13 12:41 PM  Result Value Ref Range   Troponin I <0.30 <0.30 ng/mL    Comment:        Due to the release kinetics of cTnI, a negative result within the first hours of the onset of symptoms does not rule out myocardial infarction with certainty. If myocardial infarction is still suspected, repeat the test at appropriate intervals.    Dg Chest Port 1 View  12/21/2013   CLINICAL DATA:  Chest pain.  EXAM: PORTABLE CHEST - 1 VIEW  COMPARISON:  March 21, 2013.  FINDINGS: Stable cardiomediastinal silhouette. No pneumothorax or pleural effusion is noted. Both lungs are clear. The visualized skeletal structures are unremarkable.  IMPRESSION: No acute cardiopulmonary abnormality seen.   Electronically Signed   By: Sabino Dick M.D.   On: 12/21/2013 12:51    Review of Systems  Constitutional: Negative for fever, chills and diaphoresis.  HENT: Negative for congestion and sore throat.   Respiratory: Positive for shortness of breath. Negative for cough.   Cardiovascular: Positive for chest pain.  Negative for palpitations, orthopnea and claudication.  Gastrointestinal: Negative for nausea, abdominal pain, blood in stool and melena.  Genitourinary: Negative for hematuria.  Musculoskeletal: Positive for myalgias (Left arm).  Neurological: Positive for dizziness and tingling (left arm). Negative for weakness.  All other systems reviewed and are negative.   Blood pressure 115/57, pulse 88, temperature 98.8 F (37.1 C), temperature source Oral, resp. rate 20, height 5' 4.5" (1.638 m), weight 209 lb (94.802 kg), SpO2 99 %. Physical Exam  Nursing note and vitals reviewed. Constitutional: She is oriented to person, place, and time. She appears well-developed.  Obese  HENT:  Head: Normocephalic and atraumatic.  Mouth/Throat: No oropharyngeal exudate.  Eyes: EOM are normal. Pupils are equal, round, and reactive to light. No scleral icterus.  Neck: Normal range of motion. Neck supple.  Cardiovascular: Normal rate, regular rhythm, S1 normal and S2 normal.   No murmur heard. Pulses:  Radial pulses are 2+ on the right side, and 2+ on the left side.       Dorsalis pedis pulses are 2+ on the right side, and 2+ on the left side.  No carotid bruits  Respiratory: Effort normal and breath sounds normal. She has no wheezes. She has no rales.  GI: Soft. Bowel sounds are normal. She exhibits no distension. There is no tenderness.  Musculoskeletal: She exhibits no edema.  Lymphadenopathy:    She has no cervical adenopathy.  Neurological: She is alert and oriented to person, place, and time. She exhibits normal muscle tone.  Skin: Skin is warm and dry.  Psychiatric: She has a normal mood and affect.     Assessment/Plan    Chest pain Symptoms appear typical and nitro responsive, however, with the severity you would expect the troponin to be positive by now.  Could be vasospasm which she has a history of.  She takes diltiazem and Imdur 11m BID.  Continue IV NTG.  Add IV heparin if troponin +.   Lexiscan tomorrow.  GI?  Continue dexilant or protonix.      Dyslipidemia  Check lipids.  Continue statin   Essential hypertension, benign  Controlled continue home meds.   Obstructive sleep apnea  CPAP   DM II (diabetes mellitus, type II), controlled  A1C.  SS insulin    Presyncope  We will have [employer] interrogate Loop.   HTarri Fuller PSt. Clair12/04/2013, 3:07 PM

## 2013-12-21 NOTE — ED Notes (Signed)
Remains pain free, ntg remains @ 56mcg/hr.  Room assigned/will call report.

## 2013-12-21 NOTE — ED Provider Notes (Signed)
CSN: [id number]     Arrival date & time 12/21/13  1204 History   First MD Initiated Contact with Patient 12/21/13 1232     Chief Complaint  Patient presents with  . Chest Pain     (Consider location/radiation/quality/duration/timing/severity/associated sxs/prior Treatment) HPI Complains of anterior chest pain nonradiating dull in nature onset 4 days ago pain waxes and wanes worse with exertion improved with rest and improved with sublingual nitroglycerin spray. Admits to shortness of breath no nausea no sweatiness no other associated symptoms. Pain feels like "heart pain" she's had in the past" pain is moderate to severe present. Patient treated herself with Plavix this morning as well as 2 subungual nitroglycerin sprays. Past Medical History  Diagnosis Date  . Myocardial infarction 1992 & 1998    NM Myocar Multi W/Spect W/Wall Motion EF on 08/07/10 - IMPRESSION: 1) No evidence of ischemia.  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  3) Normal LV wall motion.  4) Estimated Q G S EF 70%, unchanged.  . Asthma   . Blood transfusion   . GERD (gastroesophageal reflux disease)   . Hiatal hernia   . Other forms of migraine, without mention of intractable migraine without mention of status migrainosus   . Dry eye     Chronic  . Hypercholesteremia   . Spondylolisthesis   . Diverticulosis of colon (without mention of hemorrhage)     Colonoscopy 11/09/09 - FINDINGS: 1) 8mm polp in transverse colon, resected & retrieved.  2) Diverticulosis in sigmoid & descending colon.  3) Medium-sized, internal hemorrhoids.  . Internal hemorrhoids without mention of complication     Colonoscopy 11/09/09 - FINDINGS: 1) 51mm polp in transverse colon, resected & retrieved.  2) Diverticulosis in sigmoid & descending colon.  3) Medium-sized, internal hemorrhoids.  . Benign neoplasm of colon 11/09/09    Colonoscopy 11/09/09 - FINDINGS: 1) 83mm polp in transverse colon,  resected & retrieved.  2) Diverticulosis in sigmoid & descending colon.  3) Medium-sized, internal hemorrhoids.  . Hemorrhage of rectum and anus     Colonoscopy 11/09/09 - FINDINGS: 1) 22mm polp in transverse colon, resected & retrieved.  2) Diverticulosis in sigmoid & descending colon.  3) Medium-sized, internal hemorrhoids.  . Essential hypertension, benign   . Esophagitis   . CMC arthritis     Left middle finger  . Depression     40+ years  . Diabetes mellitus 10/2008    type 2  . Breast cancer 1989    Left breast cancer  . Obese   . Syncope     MDT LinQ implanted for evaluation  . Facet arthropathy, lumbar     L3-4  . Obstructive sleep apnea     uses oxygen at night   Past Surgical History  Procedure Laterality Date  . Abdominal hysterectomy  1978  . Joint replacement      lsft hip x2, rt knee  . Knee arthroscopy Right 2002    By Dr. Lorin Mercy  . Foot surgery Bilateral     Bil feet great toes and second toes spurs. By Dr. Milinda Pointer in 2004.  . Breast biopsy Left 1995    [facility] ([facility]  . Tonsillectomy    . Back surgery      x2 one for infection  . Cholecystectomy    . Total hip revision  12/14/2010    Procedure: TOTAL HIP REVISION;  Surgeon: Marybelle Killings;  Location: [facility] OR;  Service: Orthopedics;  Laterality: Left;  Left Total Hip Arthroplasty Revision, Poly and Ball Exchange, Possible Acetabular Revision vs. Poly Exchange  . Colonoscopy w/ polypectomy  11/09/09    FINDINGS: 1) 1mm polp in transverse colon, resected & retrieved.  2) Diverticulosis in sigmoid & descending colon.  3) Medium-sized, internal hemorrhoids.  . Loop recorder implant  11/15/06; 04/02/2013    MDT Reveal implanted 10/2006 for syncope; removed with MDT LinQ implanted 03/2013 by Dr Lovena Le  . Hemorrhoid surgery  2011    Internal & external hemorrhoidectomy (x) 3 quadrants  . Esophagogastroduodenoscopy  02/15/07  . Dilation and curettage of uterus    . Trigger finger release  Left 01/02/2013    Procedure: RELEASE LEFT MIDDLE TRIGGER FINGER/LEFT INDEX AND LEFT RING FINGER;  Surgeon: Wynonia Sours, MD;  Location: [place] [facility];  Service: Orthopedics;  Laterality: Left;  ANESTHESIA: GENERAL/IV REGIONAL FAB   Family History  Problem Relation Age of Onset  . Pancreatic cancer Mother   . Heart attack Father   . Diabetes Son   . Arthritis Brother     Severe/Crippling  . Tuberculosis Brother   . Hypertension Brother     x 2  . Heart disease Sister   . Diabetes Brother   . Seizures Brother   . Autoimmune disease Brother     AIDS  . Colon cancer Brother   . Lung cancer Brother     mets   History  Substance Use Topics  . Smoking status: Former Smoker    Types: Cigarettes    Quit date: 01/19/1983  . Smokeless tobacco: Never Used  . Alcohol Use: No   OB History    No data available     Review of Systems  Constitutional: Negative.   HENT: Negative.   Respiratory: Positive for shortness of breath.   Cardiovascular: Positive for chest pain.  Gastrointestinal: Negative.   Musculoskeletal: Negative.   Skin: Negative.   Neurological: Negative.   Psychiatric/Behavioral: Negative.   All other systems reviewed and are negative.     Allergies  Contrast media; Gadolinium; Aspirin; and Iohexol  Home Medications   Prior to Admission medications   Medication Sig Start Date End Date Taking? Authorizing Provider  amitriptyline (ELAVIL) 25 MG tablet Take 1 tablet (25 mg total) by mouth at bedtime. 02/07/13   Asencion Partridge Dohmeier, MD  atorvastatin (LIPITOR) 10 MG tablet Take 10 mg by mouth daily.     Historical Provider, MD  clopidogrel (PLAVIX) 75 MG tablet Take 75 mg by mouth daily.  07/20/12   Historical Provider, MD  cycloSPORINE (RESTASIS) 0.05 % ophthalmic emulsion Place 1 drop into both eyes 2 (two) times daily.     Historical Provider, MD  DEXILANT 60 MG capsule Take 1 capsule by mouth daily.  01/30/13   Historical Provider, MD  diazepam (VALIUM) 5  MG tablet Take 5 mg by mouth 2 (two) times daily. For muscle spasms    Historical Provider, MD  diltiazem (CARDIZEM CD) 180 MG 24 hr capsule TAKE 1 CAPSULE BY MOUTH ONCE DAILY    Evans Lance, MD  gabapentin (NEURONTIN) 100 MG capsule Take 1 capsule by mouth at bedtime.  06/18/13   Historical Provider, MD  glimepiride (AMARYL) 2 MG tablet Take 1 tablet (2 mg total) by mouth 2 (two) times daily. 12/15/10   Epimenio Foot, PA-C  HYDROcodone-acetaminophen (NORCO/VICODIN) 5-325 MG per tablet Take 1 tablet by mouth every 4 (four) hours as needed for moderate pain. 03/22/13  Hoy Morn, MD  hyoscyamine (LEVBID) 0.375 MG 12 hr tablet Take 0.375 mg by mouth 2 (two) times daily.    Historical Provider, MD  isosorbide mononitrate (IMDUR) 60 MG 24 hr tablet Take 60 mg by mouth 2 (two) times daily.     Historical Provider, MD  losartan (COZAAR) 100 MG tablet Take 100 mg by mouth daily.  04/23/13   Historical Provider, MD  metFORMIN (GLUCOPHAGE) 1000 MG tablet Take 1 tablet (1,000 mg total) by mouth 2 (two) times daily with a meal. 12/15/10   Epimenio Foot, PA-C  metoCLOPramide (REGLAN) 10 MG tablet Take one tab before each meal and at bedtime 07/27/13   Inda Castle, MD  metoCLOPramide (REGLAN) 10 MG tablet TAKE 1 TABLET BY MOUTH BEFORE [facility] MEAL AND AT BEDTIME 12/17/13   Inda Castle, MD  nitroGLYCERIN (NITROLINGUAL) 0.4 MG/SPRAY spray Place 1 spray under the tongue every 5 (five) minutes as needed. Chest pains      Historical Provider, MD  Olopatadine HCl (PATADAY) 0.2 % SOLN Place 1 drop into both eyes daily.     Historical Provider, MD  PROAIR HFA 108 (90 BASE) MCG/ACT inhaler Inhale 2 puffs into the lungs every 4 (four) hours as needed for shortness of breath.  07/20/12   Historical Provider, MD  saxagliptin HCl (ONGLYZA) 5 MG TABS tablet Take 5 mg by mouth daily.     Historical Provider, MD  topiramate (TOPAMAX) 100 MG tablet TAKE 1 TABLET BY MOUTH EVERY EVENING. 08/14/13   Larey Seat, MD   topiramate (TOPAMAX) 25 MG tablet TAKE 1 TABLET BY MOUTH EVERY MORNING. 08/14/13   Larey Seat, MD  traMADol (ULTRAM) 50 MG tablet Take 50 mg by mouth every 6 (six) hours as needed.  07/25/12   Historical Provider, MD  zolpidem (AMBIEN) 10 MG tablet Take 10 mg by mouth at bedtime.  06/21/12   Historical Provider, MD   BP 137/86 mmHg  Pulse 98  Temp(Src) 98.8 F (37.1 C) (Oral)  Resp 22  Ht 5' 4.5" (1.638 m)  Wt 209 lb (94.802 kg)  BMI 35.33 kg/m2  SpO2 97% Physical Exam  Constitutional: She appears well-developed and well-nourished.  HENT:  Head: Normocephalic and atraumatic.  Eyes: Conjunctivae are normal. Pupils are equal, round, and reactive to light.  Neck: Neck supple. No tracheal deviation present. No thyromegaly present.  Cardiovascular: Normal rate and regular rhythm.   No murmur heard. Pulmonary/Chest: Effort normal and breath sounds normal.  Abdominal: Soft. Bowel sounds are normal. She exhibits no distension. There is no tenderness.  Musculoskeletal: Normal range of motion. She exhibits no edema or tenderness.  Neurological: She is alert. Coordination normal.  Skin: Skin is warm and dry. No rash noted.  Psychiatric: She has a normal mood and affect.  Nursing note and vitals reviewed.   ED Course  Procedures (including critical care time) Labs Review Labs Reviewed  [place], ED    Imaging Review No results found.   EKG Interpretation None      Date: 12/21/2013  Rate: 105  Rhythm: normal sinus rhythm  QRS Axis: left  Intervals: normal  ST/T Wave abnormalities: nonspecific T wave changes  Conduction Disutrbances:nonspecific intraventricular conduction delay  Narrative Interpretation:   Old EKG Reviewed: none available Muse system down   2:15 PM pain much improved after treatment with intravenous nitroglycerin drip and intravenous morphine  Results for orders placed or performed during the  hospital encounter of 12/21/13  CBC  Result Value Ref Range   WBC 9.9 4.0 - 10.5 K/uL   RBC 4.36 3.87 - 5.11 MIL/uL   Hemoglobin 11.7 (L) 12.0 - 15.0 g/dL   HCT 37.3 36.0 - 46.0 %   MCV 85.6 78.0 - 100.0 fL   MCH 26.8 26.0 - 34.0 pg   MCHC 31.4 30.0 - 36.0 g/dL   RDW 15.3 11.5 - 15.5 %   Platelets 336 150 - 400 K/uL  Basic metabolic panel  Result Value Ref Range   Sodium 145 137 - 147 mEq/L   Potassium 4.3 3.7 - 5.3 mEq/L   Chloride 107 96 - 112 mEq/L   CO2 24 19 - 32 mEq/L   Glucose, Bld 79 70 - 99 mg/dL   BUN 8 6 - 23 mg/dL   Creatinine, Ser 0.83 0.50 - 1.10 mg/dL   Calcium 9.7 8.4 - 10.5 mg/dL   GFR calc non Af Amer 72 (L) >90 mL/min   GFR calc Af Amer 83 (L) >90 mL/min   Anion gap 14 5 - 15  BNP (order ONLY if patient complains of dyspnea/SOB AND you have documented it for THIS visit)  Result Value Ref Range   Pro B Natriuretic peptide (BNP) 55.7 0 - 125 pg/mL  Troponin I  Result Value Ref Range   Troponin I <0.30 <0.30 ng/mL   Dg Chest Port 1 View  12/21/2013   CLINICAL DATA:  Chest pain.  EXAM: PORTABLE CHEST - 1 VIEW  COMPARISON:  March 21, 2013.  FINDINGS: Stable cardiomediastinal silhouette. No pneumothorax or pleural effusion is noted. Both lungs are clear. The visualized skeletal structures are unremarkable.  IMPRESSION: No acute cardiopulmonary abnormality seen.   Electronically Signed   By: Sabino Dick M.D.   On: 12/21/2013 12:51    MDM  Intravenous nitroglycerin drip ordered. Cardiology consult called by me  Final diagnoses:  None   [facility] cardiology service to evaluate patient and arrange for disposition Diagnosis unstable angina CRITICAL CARE Performed by: Orlie Dakin Total critical care time: 30 minute Critical care time was exclusive of separately billable procedures and treating other patients. Critical care was necessary to treat or prevent imminent or life-threatening deterioration. Critical care was time spent personally by me on  the following activities: development of treatment plan with patient and/or surrogate as well as nursing, discussions with consultants, evaluation of patient's response to treatment, examination of patient, obtaining history from patient or surrogate, ordering and performing treatments and interventions, ordering and review of laboratory studies, ordering and review of radiographic studies, pulse oximetry and re-evaluation of patient's condition.     Orlie Dakin, MD 12/21/13 [phone]

## 2013-12-21 NOTE — ED Notes (Signed)
Pt moved to C27.  Reports pain 2/10 substernal "just a pressure" and reports she has yet to be pain free.  VSS.  Ntg gtt increased.  Skin warm, dry, pt in no distress.  Lights down, call light in reach.  Pt asking for food.  Explained awaiting orders and will go from there.

## 2013-12-21 NOTE — ED Notes (Signed)
Per pt sts 4 days of chest pain. sts the first 3 days she took 2 nitro and the pain went away and today she took 3 with no relief. sts pain radiated dow her left arm. sts yesterday she almost passed out.

## 2013-12-22 ENCOUNTER — Observation Stay (HOSPITAL_COMMUNITY): Payer: PRIVATE HEALTH INSURANCE

## 2013-12-22 ENCOUNTER — Other Ambulatory Visit: Payer: Self-pay

## 2013-12-22 DIAGNOSIS — R079 Chest pain, unspecified: Secondary | ICD-10-CM

## 2013-12-22 DIAGNOSIS — I200 Unstable angina: Secondary | ICD-10-CM | POA: Diagnosis not present

## 2013-12-22 DIAGNOSIS — G4733 Obstructive sleep apnea (adult) (pediatric): Secondary | ICD-10-CM | POA: Diagnosis not present

## 2013-12-22 DIAGNOSIS — I252 Old myocardial infarction: Secondary | ICD-10-CM | POA: Diagnosis not present

## 2013-12-22 DIAGNOSIS — J45901 Unspecified asthma with (acute) exacerbation: Secondary | ICD-10-CM | POA: Diagnosis not present

## 2013-12-22 LAB — LIPID PANEL
CHOL/HDL RATIO: 3.8 ratio
Cholesterol: 164 mg/dL (ref 0–200)
HDL: 43 mg/dL (ref >39)
LDL Cholesterol: 87 mg/dL (ref 0–99)
Triglycerides: 170 mg/dL — ABNORMAL HIGH (ref <150)
VLDL: 34 mg/dL (ref 0–40)

## 2013-12-22 LAB — GLUCOSE, CAPILLARY
GLUCOSE-CAPILLARY: 122 mg/dL — AB (ref 70–99)
GLUCOSE-CAPILLARY: 122 mg/dL — AB (ref 70–99)
Glucose-Capillary: 155 mg/dL — ABNORMAL HIGH (ref 70–99)
Glucose-Capillary: 144 mg/dL — ABNORMAL HIGH (ref 70–99)

## 2013-12-22 LAB — BASIC METABOLIC PANEL
ANION GAP: 13 (ref 5–15)
BUN: 10 mg/dL (ref 6–23)
CO2: 24 mEq/L (ref 19–32)
Calcium: 9.1 mg/dL (ref 8.4–10.5)
Chloride: 105 mEq/L (ref 96–112)
Creatinine, Ser: 0.9 mg/dL (ref 0.50–1.10)
GFR calc Af Amer: 76 mL/min — ABNORMAL LOW (ref >90)
GFR, EST NON AFRICAN AMERICAN: 65 mL/min — AB (ref >90)
GLUCOSE: 142 mg/dL — AB (ref 70–99)
POTASSIUM: 4 meq/L (ref 3.7–5.3)
Sodium: 142 mEq/L (ref 137–147)

## 2013-12-22 LAB — HEMOGLOBIN A1C
Hgb A1c MFr Bld: 5.7 % — ABNORMAL HIGH (ref <5.7)
Mean Plasma Glucose: 117 mg/dL — ABNORMAL HIGH (ref <117)

## 2013-12-22 LAB — TROPONIN I
Troponin I: <0.3 ng/mL (ref <0.30)
Troponin I: <0.3 ng/mL (ref <0.30)

## 2013-12-22 MED ORDER — OLOPATADINE HCL 0.1 % OP SOLN: 1.0000 [drp] | Freq: Two times a day (BID) | OPHTHALMIC | Status: DC

## 2013-12-22 MED ORDER — TECHNETIUM TC 99M SESTAMIBI GENERIC - CARDIOLITE
30.0000 | Freq: Once | INTRAVENOUS | Status: AC | PRN
Start: 2013-12-22 — End: 2013-12-22
  Administered 2013-12-22: 30 via INTRAVENOUS

## 2013-12-22 MED ORDER — CYCLOSPORINE 0.05 % OP EMUL: 1.0000 [drp] | Freq: Two times a day (BID) | OPHTHALMIC | Status: DC

## 2013-12-22 MED ORDER — ACETAMINOPHEN 325 MG PO TABS: 650.0000 mg | ORAL_TABLET | ORAL | Status: DC | PRN

## 2013-12-22 MED ORDER — OLOPATADINE HCL 0.1 % OP SOLN
1.0000 [drp] | Freq: Two times a day (BID) | OPHTHALMIC | Status: DC
Start: 2013-12-22 — End: 2013-12-23
  Administered 2013-12-22 – 2013-12-23 (×3): 1 [drp] via OPHTHALMIC
  Filled 2013-12-22: qty 5

## 2013-12-22 MED ORDER — TECHNETIUM TC 99M SESTAMIBI GENERIC - CARDIOLITE
10.0000 | Freq: Once | INTRAVENOUS | Status: AC | PRN
  Administered 2013-12-22: 10 via INTRAVENOUS

## 2013-12-22 MED ORDER — ISOSORBIDE MONONITRATE ER 60 MG PO TB24
60.0000 mg | ORAL_TABLET | Freq: Two times a day (BID) | ORAL | Status: DC
  Administered 2013-12-22 – 2013-12-23 (×3): 60 mg via ORAL
  Filled 2013-12-22 (×2): qty 1

## 2013-12-22 MED ORDER — REGADENOSON 0.4 MG/5ML IV SOLN
INTRAVENOUS | Status: AC
  Administered 2013-12-22: 0.4 mg
  Filled 2013-12-22: qty 5

## 2013-12-22 MED ORDER — CYCLOSPORINE 0.05 % OP EMUL
1.0000 [drp] | Freq: Two times a day (BID) | OPHTHALMIC | Status: DC
  Administered 2013-12-22 – 2013-12-23 (×3): 1 [drp] via OPHTHALMIC
  Filled 2013-12-22 (×5): qty 1

## 2013-12-22 NOTE — Plan of Care (Signed)
Problem: Phase I Progression Outcomes Goal: Hemodynamically stable Outcome: Completed/Met Date Met:  12/22/13 Goal: Anginal pain relieved Outcome: Completed/Met Date Met:  12/22/13 Goal: Aspirin unless contraindicated Outcome: Not Applicable Date Met:  19/15/50 Goal: MD aware of Cardiac Marker results Outcome: Completed/Met Date Met:  12/22/13 Goal: Voiding-avoid urinary catheter unless indicated Outcome: Completed/Met Date Met:  12/22/13

## 2013-12-22 NOTE — Progress Notes (Signed)
Pt reports "chest tightness" to Dr. Wynonia Lawman visiting. Rates chest pain 7/10. BP 116/80. Given  0..4 mg SL nitroglycerin at 1709. Reports "pain better" at 5/10. BP drops to 108/69. States would not want additional NTG because "have strong system" and "know pain will progressively get  better" Reports "pain gone" later on  Remains chest pain free and appears in no acute distress

## 2013-12-22 NOTE — Discharge Summary (Signed)
Physician Discharge Summary     Cardiologist:  Lovena Le  Patient ID: [name] MRN: [id number] DOB/AGE: 1945-10-10 68 y.o.  Admit date: 12/21/2013 Discharge date: 12/23/2013  Admission Diagnoses:  Chest Pain  Discharge Diagnoses:  Principal Problem:   Chest pain Active Problems:   Dyslipidemia   Essential hypertension, benign   Obstructive sleep apnea   DM II (diabetes mellitus, type II), controlled   Obesity  Discharged Condition: stable  Hospital Course:   [name] is a 68 yo woman with a h/o probable autonomic dysfunction, HTN, probable coronary spasm, and dyslipidemia. 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 Coronaries were clean. She was unable to walk and underwent stress perfusion imaging in Oct 2014 which was unremarkable. She also wore a cardiac monitor which demonstrated PACs and PVCs but no sustained arrhythmias. She continues to have episodes of nonexertional chest pain. 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 She had another episode of syncope which occurred at church while standing. On awakening she felt like her heart was beating fast. She eventually improved but thought that she was out for less than a minute, maybe only a few seconds. She underwent a new loop recorder implant on 04/02/13 for due to new unexplained syncope.  She presented with CP that started four days ago. She's had one to two episodes each day. It is associated with left arm pain, tingling and SOB. No N, V, diaphoresis. Worse with exertion. Better with rest and NTG spray. At its worst the pain was 10/10(today). The patient currently denies fever, orthopnea, dizziness, PND, cough, congestion, abdominal pain, hematochezia, melena, lower extremity edema. She has an extreme contrast allergy.   The patient was admitted and started on IV NTG.  Imdur held.  She ruled out for MI.  EKG showed nonspecific T wave  changes.  She adamantly refused left heart cath due to contrast allergy.  Interrogation of LINQ Reveal idicated pause and bradycardia episodes however, this was due to signal undersensing and were not true.  She underwent a lexiscan stress test which was nonischemic with an EF of 42%.  We weaned off the IV NTG and restarted Imdur 60mg  BID.  BP and HR were controlled.   The patient was seen by Dr. Wynonia Lawman who felt she was stable for DC home.    Consults: None  Significant Diagnostic Studies:  Lexiscan Cardiolite FINDINGS: Perfusion: No decreased activity in the left ventricle on stress imaging to suggest reversible ischemia or infarction.  Wall Motion: Normal left ventricular wall motion. No left ventricular dilation.  Left Ventricular Ejection Fraction: 42 % End diastolic volume 63 ml End systolic volume 36 ml  IMPRESSION: 1. No reversible ischemia or infarction. 2. Normal left ventricular wall motion. 3. Left ventricular ejection fraction 42% 4. Low-risk stress test findings*.   Lipid Panel     Component Value Date/Time   CHOL 164 12/22/2013 0655   TRIG 170* 12/22/2013 0655   HDL 43 12/22/2013 0655   CHOLHDL 3.8 12/22/2013 0655   VLDL 34 12/22/2013 0655   LDLCALC 87 12/22/2013 0655    Treatments: See above  Discharge Exam: Blood pressure 109/66, pulse 87, temperature 98.4 F (36.9 C), temperature source Oral, resp. rate 20, height 5' 4.5" (1.638 m), weight 213 lb (96.616 kg), SpO2 95 %.   Disposition: 01-Home or Self Care      Discharge Instructions    Diet - low sodium heart healthy  Complete by:  As directed             Medication List    TAKE these medications        acetaminophen 325 MG tablet  Commonly known as:  TYLENOL  Take 2 tablets (650 mg total) by mouth every 4 (four) hours as needed for headache or mild pain.     amitriptyline 25 MG tablet  Commonly known as:  ELAVIL  Take 1 tablet (25 mg total) by mouth at bedtime.      atorvastatin 10 MG tablet  Commonly known as:  LIPITOR  Take 10 mg by mouth daily.     cholecalciferol 1000 UNITS tablet  Commonly known as:  VITAMIN D  Take 1,000 Units by mouth daily.     clopidogrel 75 MG tablet  Commonly known as:  PLAVIX  Take 75 mg by mouth daily.     cycloSPORINE 0.05 % ophthalmic emulsion  Commonly known as:  RESTASIS  Place 1 drop into both eyes 2 (two) times daily.     DEXILANT 60 MG capsule  Generic drug:  dexlansoprazole  Take 1 capsule by mouth daily.     diazepam 5 MG tablet  Commonly known as:  VALIUM  Take 5 mg by mouth 2 (two) times daily. For muscle spasms     diclofenac sodium 1 % Gel  Commonly known as:  VOLTAREN  Apply 1 application topically daily as needed.     diltiazem 180 MG 24 hr capsule  Commonly known as:  CARDIZEM CD  TAKE 1 CAPSULE BY MOUTH ONCE DAILY     diphenhydrAMINE 25 mg capsule  Commonly known as:  BENADRYL  Take 25 mg by mouth at bedtime.     gabapentin 100 MG capsule  Commonly known as:  NEURONTIN  Take 1 capsule by mouth at bedtime.     glimepiride 2 MG tablet  Commonly known as:  AMARYL  Take 1 tablet (2 mg total) by mouth 2 (two) times daily.     HYDROcodone-acetaminophen 5-325 MG per tablet  Commonly known as:  NORCO/VICODIN  Take 1 tablet by mouth every 4 (four) hours as needed for moderate pain.     hyoscyamine 0.375 MG 12 hr tablet  Commonly known as:  LEVBID  Take 0.375 mg by mouth 2 (two) times daily.     isosorbide mononitrate 60 MG 24 hr tablet  Commonly known as:  IMDUR  Take 60 mg by mouth 2 (two) times daily.     losartan 100 MG tablet  Commonly known as:  COZAAR  Take 100 mg by mouth daily.     metFORMIN 1000 MG tablet  Commonly known as:  GLUCOPHAGE  Take 1 tablet (1,000 mg total) by mouth 2 (two) times daily with a meal.     metoCLOPramide 10 MG tablet  Commonly known as:  REGLAN  Take one tab before each meal and at bedtime     nitroGLYCERIN 0.4 MG/SPRAY spray  Commonly  known as:  NITROLINGUAL  - Place 1 spray under the tongue every 5 (five) minutes as needed. Chest pains  -      ONGLYZA 5 MG Tabs tablet  Generic drug:  saxagliptin HCl  Take 5 mg by mouth daily.     OVER THE COUNTER MEDICATION  Take 1 capsule by mouth daily. Hair skin and Nail     PATADAY 0.2 % Soln  Generic drug:  Olopatadine HCl  Place 1 drop into both eyes daily.  PROAIR HFA 108 (90 BASE) MCG/ACT inhaler  Generic drug:  albuterol  Inhale 2 puffs into the lungs every 4 (four) hours as needed for shortness of breath.     topiramate 25 MG tablet  Commonly known as:  TOPAMAX  TAKE 1 TABLET BY MOUTH EVERY MORNING.     topiramate 100 MG tablet  Commonly known as:  TOPAMAX  TAKE 1 TABLET BY MOUTH EVERY EVENING.     traMADol 50 MG tablet  Commonly known as:  ULTRAM  Take 50 mg by mouth every 6 (six) hours as needed.     zolpidem 10 MG tablet  Commonly known as:  AMBIEN  Take 10 mg by mouth at bedtime.       Follow-up Information    Follow up with [name], MD.   Specialty:  Cardiology   Why:  office will call you   Contact information:   [address] [place] [address] [phone]      Greater than 30 minutes was spent completing the patient's discharge.    SignedTarri Fuller, [place] 12/23/2013, 8:16 AM

## 2013-12-22 NOTE — Discharge Instructions (Signed)

## 2013-12-22 NOTE — Progress Notes (Signed)
Subjective: No further CP.  Objective: Vital signs in last 24 hours: Temp:  [97.9 F (36.6 C)-98.9 F (37.2 C)] 98.9 F (37.2 C) (12/05 0400) Pulse Rate:  [80-98] 89 (12/05 0400) Resp:  [13-22] 16 (12/05 0400) BP: (104-143)/(52-86) 119/71 mmHg (12/05 0400) SpO2:  [94 %-100 %] 97 % (12/05 0400) Weight:  [209 lb (94.802 kg)] 209 lb (94.802 kg) (12/04 1209)    Intake/Output from previous day: 12/04 0701 - 12/05 0700 In: 334.5 [P.O.:240; I.V.:94.5] Out: -  Intake/Output this shift:    Medications Current Facility-Administered Medications  Medication Dose Route Frequency Provider Last Rate Last Dose  . acetaminophen (TYLENOL) tablet 650 mg  650 mg Oral Q4H PRN Brett Canales, PA-C      . albuterol (PROVENTIL) (2.5 MG/3ML) 0.083% nebulizer solution 2.5 mg  2.5 mg Inhalation Q4H PRN Brett Canales, PA-C      . amitriptyline (ELAVIL) tablet 25 mg  25 mg Oral QHS Brett Canales, PA-C   25 mg at 12/21/13 2215  . atorvastatin (LIPITOR) tablet 10 mg  10 mg Oral Daily Brett Canales, PA-C      . clopidogrel (PLAVIX) tablet 75 mg  75 mg Oral Daily Brett Canales, PA-C   75 mg at 12/21/13 2038  . cycloSPORINE (RESTASIS) 0.05 % ophthalmic emulsion 1 drop  1 drop Both Eyes BID Evans Lance, MD      . diltiazem [facility] CD) 24 hr capsule 180 mg  180 mg Oral Daily Brett Canales, PA-C      . gabapentin (NEURONTIN) capsule 100 mg  100 mg Oral QHS Brett Canales, PA-C   100 mg at 12/21/13 2215  . HYDROcodone-acetaminophen (NORCO/VICODIN) 5-325 MG per tablet 1 tablet  1 tablet Oral Q4H PRN Brett Canales, PA-C      . hyoscyamine (LEVBID) 0.375 MG 12 hr tablet 0.375 mg  0.375 mg Oral BID Brett Canales, PA-C   0.375 mg at 12/21/13 2214  . insulin aspart (novoLOG) injection 0-15 Units  0-15 Units Subcutaneous TID WC Brett Canales, PA-C   0 Units at 12/22/13 8185  . losartan (COZAAR) tablet 100 mg  100 mg Oral Daily Brett Canales, PA-C      . metoCLOPramide (REGLAN) tablet 10 mg  10 mg Oral TID AC Brett Canales, PA-C   10 mg at 12/22/13 6314  . nitroGLYCERIN (NITROSTAT) SL tablet 0.4 mg  0.4 mg Sublingual Q5 Min x 3 PRN Brett Canales, PA-C      . nitroGLYCERIN 50 mg in dextrose 5 % 250 mL (0.2 mg/mL) infusion  10-200 mcg/min Intravenous Titrated Orlie Dakin, MD 6 mL/hr at 12/21/13 1558 20 mcg/min at 12/21/13 1558  . olopatadine (PATANOL) 0.1 % ophthalmic solution 1 drop  1 drop Both Eyes BID Evans Lance, MD      . ondansetron [facility]) injection 4 mg  4 mg Intravenous Q6H PRN Brett Canales, PA-C      . pantoprazole (PROTONIX) EC tablet 40 mg  40 mg Oral Daily Brett Canales, PA-C      . topiramate (TOPAMAX) tablet 100 mg  100 mg Oral QPM Brett Canales, PA-C   100 mg at 12/21/13 2037  . topiramate (TOPAMAX) tablet 25 mg  25 mg Oral q morning - 10a Brett Canales, PA-C      . traMADol [facility]at[facility]bon) tablet 50 mg  50 mg Oral Q6H PRN Brett Canales, PA-C      .  zolpidem (AMBIEN) tablet 5 mg  5 mg Oral QHS Brett Canales, PA-C   5 mg at 12/21/13 2215    PE: General appearance: alert, cooperative and no distress Lungs: clear to auscultation bilaterally Heart: regular rate and rhythm, S1, S2 normal, no murmur, click, rub or gallop Extremities: No LEE Pulses: 2+ and symmetric Skin: Warm and dry Neurologic: Grossly normal  Lab Results:   Recent Labs  12/21/13 1241  WBC 9.9  HGB 11.7*  HCT 37.3  PLT 336   BMET  Recent Labs  12/21/13 1241 12/22/13 0655  NA 145 142  K 4.3 4.0  CL 107 105  CO2 24 24  GLUCOSE 79 142*  BUN 8 10  CREATININE 0.83 0.90  CALCIUM 9.7 9.1   Lipid Panel     Component Value Date/Time   CHOL 164 12/22/2013 0655   TRIG 170* 12/22/2013 0655   HDL 43 12/22/2013 0655   CHOLHDL 3.8 12/22/2013 0655   VLDL 34 12/22/2013 0655   LDLCALC 87 12/22/2013 0655   Cardiac Panel (last 3 results)  Recent Labs  12/21/13 1940 12/22/13 0133 12/22/13 0655  TROPONINI <0.30 <0.30 <0.30     Assessment/Plan  68 yo woman with a h/o probable autonomic dysfunction, HTN,  probable coronary spasm, and dyslipidemia. 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 Coronaries were clean. She was unable to walk and underwent stress perfusion imaging in Oct 2014 which was unremarkable.  Severe contrast allergy.   Principal Problem:   Chest pain Active Problems:   Dyslipidemia   Essential hypertension, benign   Obstructive sleep apnea   DM II (diabetes mellitus, type II), controlled   Obesity  Plan:  Ruled out for MI.  Lexiscan today.  Wean off IV NTG and restart home imdur.  Triglycerides mildly elevated. LD close to goal.  Clearly needs better eating habits(BMI 35.4).  On a statin.  BP controlled.  Meds: plavix, diltiazem 180, cozaar 100. PVCs on tele.  Otherwise, NSR.   Interrogation of LINQ Reveal idicated pause and bradycardia episodes however, this was due to signal undersensing and were not true.    LOS: 1 day    HAGER, BRYAN PA-C 12/22/2013 10:36 AM   Patient reviewed and examined.  Agree with above.  Patient has clearly felt better today.  Myoview is not back and I think it would be reasonable to observe for overnight one more night.  If things are fine in the morning.  We can probably let her go home tomorrow.  Kerry Hough. MD [facility] 12/22/2013  4:57 PM

## 2013-12-22 NOTE — Progress Notes (Signed)
UR completed 

## 2013-12-23 DIAGNOSIS — G4733 Obstructive sleep apnea (adult) (pediatric): Secondary | ICD-10-CM | POA: Diagnosis not present

## 2013-12-23 DIAGNOSIS — I252 Old myocardial infarction: Secondary | ICD-10-CM | POA: Diagnosis not present

## 2013-12-23 DIAGNOSIS — J45901 Unspecified asthma with (acute) exacerbation: Secondary | ICD-10-CM | POA: Diagnosis not present

## 2013-12-23 DIAGNOSIS — I200 Unstable angina: Secondary | ICD-10-CM | POA: Diagnosis not present

## 2013-12-23 LAB — GLUCOSE, CAPILLARY: Glucose-Capillary: 144 mg/dL — ABNORMAL HIGH (ref 70–99)

## 2013-12-23 MED ORDER — METRONIDAZOLE 500 MG PO TABS
ORAL_TABLET | ORAL | Status: AC
  Filled 2013-12-23: qty 1

## 2013-12-23 NOTE — Progress Notes (Signed)
Utilization Review Completed.   Makenna Macaluso, RN, BSN Nurse Case Manager  

## 2013-12-23 NOTE — Plan of Care (Signed)
Problem: Phase I Progression Outcomes Goal: Other Phase I Outcomes/Goals Outcome: Not Applicable Date Met:  63/84/53  Problem: Phase II Progression Outcomes Goal: Hemodynamically stable Outcome: Completed/Met Date Met:  12/23/13 Goal: Anginal pain relieved Outcome: Completed/Met Date Met:  12/23/13 Goal: Stress Test if indicated Outcome: Completed/Met Date Met:  12/23/13 Goal: Cath/PCI Day Path if indicated Outcome: Not Applicable Date Met:  64/68/03 Goal: CV Risk Factors identified Outcome: Completed/Met Date Met:  12/23/13 Goal: Cardiac Rehab if ordered Outcome: Not Applicable Date Met:  21/22/48 Goal: If positive for MI, change to MI Path Outcome: Not Applicable Date Met:  25/00/37 Goal: Other Phase II Outcomes/Goals Outcome: Not Applicable Date Met:  04/88/89  Problem: Phase III Progression Outcomes Goal: Hemodynamically stable Outcome: Completed/Met Date Met:  12/23/13 Goal: No anginal pain Outcome: Completed/Met Date Met:  12/23/13 Goal: Cath/PCI Path as indicated Outcome: Not Applicable Date Met:  16/94/50 Goal: Vascular site scale level 0 - I Vascular Site Scale Level 0: No bruising/bleeding/hematoma Level I (Mild): Bruising/Ecchymosis, minimal bleeding/ooozing, palpable hematoma < 3 cm Level II (Moderate): Bleeding not affecting hemodynamic parameters, pseudoaneurysm, palpable hematoma > 3 cm Level III (Severe) Bleeding which affects hemodynamic parameters or retroperitoneal hemorrhage  Outcome: Not Applicable Date Met:  38/88/28 Goal: Discharge plan remains appropriate-arrangements made Outcome: Completed/Met Date Met:  12/23/13 Goal: Tolerating diet Outcome: Completed/Met Date Met:  12/23/13 Goal: If positive for MI, change to MI Path Outcome: Not Applicable Date Met:  00/34/91 Goal: Other Phase III Outcomes/Goals Outcome: Not Applicable Date Met:  79/15/05  Problem: Discharge Progression Outcomes Goal: No anginal pain Outcome: Completed/Met Date Met:   12/23/13 Goal: Hemodynamically stable Outcome: Completed/Met Date Met:  69/79/48 Goal: Complications resolved/controlled Outcome: Completed/Met Date Met:  12/23/13 Goal: Barriers To Progression Addressed/Resolved Outcome: Completed/Met Date Met:  12/23/13 Goal: Discharge plan in place and appropriate Outcome: Completed/Met Date Met:  12/23/13 Goal: Vascular site scale level 0 - I Vascular Site Scale Level 0: No bruising/bleeding/hematoma Level I (Mild): Bruising/Ecchymosis, minimal bleeding/ooozing, palpable hematoma < 3 cm Level II (Moderate): Bleeding not affecting hemodynamic parameters, pseudoaneurysm, palpable hematoma > 3 cm Level III (Severe) Bleeding which affects hemodynamic parameters or retroperitoneal hemorrhage  Outcome: Not Applicable Date Met:  01/65/53 Goal: Tolerates diet Outcome: Completed/Met Date Met:  12/23/13 Goal: Activity appropriate for discharge plan Outcome: Completed/Met Date Met:  12/23/13 Goal: Other Discharge Outcomes/Goals Outcome: Not Applicable Date Met:  74/82/70

## 2013-12-23 NOTE — Progress Notes (Signed)
Subjective: She reports an episode of 6/10 CP last night.  Lasted five minutes.  NTG given.  Objective: Vital signs in last 24 hours: Temp:  [97.6 F (36.4 C)-98.4 F (36.9 C)] 98.4 F (36.9 C) (12/06 0533) Pulse Rate:  [87-89] 87 (12/06 0533) Resp:  [18-20] 20 (12/06 0533) BP: (109-121)/(62-72) 109/66 mmHg (12/06 0533) SpO2:  [95 %-100 %] 95 % (12/06 0533) Weight:  [213 lb (96.616 kg)] 213 lb (96.616 kg) (12/06 0533) Last BM Date: 12/22/13  Intake/Output from previous day:   Intake/Output this shift:    Medications Current Facility-Administered Medications  Medication Dose Route Frequency Provider Last Rate Last Dose  . acetaminophen (TYLENOL) tablet 650 mg  650 mg Oral Q4H PRN Brett Canales, PA-C      . albuterol (PROVENTIL) (2.5 MG/3ML) 0.083% nebulizer solution 2.5 mg  2.5 mg Inhalation Q4H PRN Brett Canales, PA-C      . amitriptyline (ELAVIL) tablet 25 mg  25 mg Oral QHS Brett Canales, PA-C   25 mg at 12/22/13 2203  . atorvastatin (LIPITOR) tablet 10 mg  10 mg Oral Daily Brett Canales, PA-C   10 mg at 12/22/13 1049  . clopidogrel (PLAVIX) tablet 75 mg  75 mg Oral Daily Brett Canales, PA-C   75 mg at 12/22/13 1047  . cycloSPORINE (RESTASIS) 0.05 % ophthalmic emulsion 1 drop  1 drop Both Eyes BID Evans Lance, MD   1 drop at 12/22/13 2032  . diltiazem (CARDIZEM CD) 24 hr capsule 180 mg  180 mg Oral Daily Brett Canales, PA-C   180 mg at 12/22/13 1048  . gabapentin (NEURONTIN) capsule 100 mg  100 mg Oral QHS Brett Canales, PA-C   100 mg at 12/22/13 2203  . HYDROcodone-acetaminophen (NORCO/VICODIN) 5-325 MG per tablet 1 tablet  1 tablet Oral Q4H PRN Brett Canales, PA-C   1 tablet at 12/22/13 2029  . hyoscyamine (LEVBID) 0.375 MG 12 hr tablet 0.375 mg  0.375 mg Oral BID Brett Canales, PA-C   0.375 mg at 12/22/13 2203  . insulin aspart (novoLOG) injection 0-15 Units  0-15 Units Subcutaneous TID WC Brett Canales, PA-C   3 Units at 12/22/13 1845  . isosorbide mononitrate  (IMDUR) 24 hr tablet 60 mg  60 mg Oral BID Brett Canales, PA-C   60 mg at 12/22/13 2203  . losartan (COZAAR) tablet 100 mg  100 mg Oral Daily Brett Canales, PA-C   100 mg at 12/22/13 1047  . metoCLOPramide (REGLAN) tablet 10 mg  10 mg Oral TID AC Brett Canales, PA-C   10 mg at 12/22/13 1708  . nitroGLYCERIN (NITROSTAT) SL tablet 0.4 mg  0.4 mg Sublingual Q5 Min x 3 PRN Brett Canales, PA-C   0.4 mg at 12/22/13 1709  . olopatadine (PATANOL) 0.1 % ophthalmic solution 1 drop  1 drop Both Eyes BID Evans Lance, MD   1 drop at 12/22/13 2033  . ondansetron (ZOFRAN) injection 4 mg  4 mg Intravenous Q6H PRN Brett Canales, PA-C      . pantoprazole (PROTONIX) EC tablet 40 mg  40 mg Oral Daily Brett Canales, PA-C   40 mg at 12/22/13 1049  . topiramate (TOPAMAX) tablet 100 mg  100 mg Oral QPM Brett Canales, PA-C   100 mg at 12/22/13 1718  . topiramate (TOPAMAX) tablet 25 mg  25 mg Oral q morning - 10a Brett Canales, PA-C  25 mg at 12/22/13 1048  . traMADol (ULTRAM) tablet 50 mg  50 mg Oral Q6H PRN Brett Canales, PA-C      . zolpidem [facility]) tablet 5 mg  5 mg Oral QHS Brett Canales, PA-C   5 mg at 12/22/13 2203    PE: General appearance: alert, cooperative and no distress Lungs: clear to auscultation bilaterally Heart: regular rate and rhythm, S1, S2 normal, no murmur, click, rub or gallop Extremities: No LEE Pulses: 2+ and symmetric Skin: Warm and dry Neurologic: Grossly normal  Lab Results:   Recent Labs  12/21/13 1241  WBC 9.9  HGB 11.7*  HCT 37.3  PLT 336   BMET  Recent Labs  12/21/13 1241 12/22/13 0655  NA 145 142  K 4.3 4.0  CL 107 105  CO2 24 24  GLUCOSE 79 142*  BUN 8 10  CREATININE 0.83 0.90  CALCIUM 9.7 9.1   PT/INR No results for input(s): LABPROT, INR in the last 72 hours. Cholesterol  Recent Labs  12/22/13 0655  CHOL 164   Lipid Panel     Component Value Date/Time   CHOL 164 12/22/2013 0655   TRIG 170* 12/22/2013 0655   HDL 43 12/22/2013 0655   CHOLHDL  3.8 12/22/2013 0655   VLDL 34 12/22/2013 0655   LDLCALC 87 12/22/2013 0655     Cardiac Panel (last 3 results)  Recent Labs  12/21/13 1940 12/22/13 0133 12/22/13 0655  TROPONINI <0.30 <0.30 <0.30    Assessment/Plan  Principal Problem:   Chest pain Active Problems:   Dyslipidemia   Essential hypertension, benign   Obstructive sleep apnea   DM II (diabetes mellitus, type II), controlled  Plan:  CP last night, while in bed, which appears to be NTG responsive.  SP nonischemic, low risk Lexiscan.  EF 42%.  She was weaned off IV NTG and restarted on Imdur 60mg  BID yesterday.  I had her ambulate with the RN this morning down the hall and she had no CP.  Could consider change Dilt to amlodipine.  It appears vaso spasm was the issue years ago.  BP controlled.    DC home today.    LOS: 2 days    HAGER, BRYAN PA-C 12/23/2013 7:26 AM   Patient seen and examined, history reviewed.  Telemetry reviewed.  She hasn't had any chest discomfort but did have some last night, not associated with any EKG changes or changes on telemetry.  Her Myoview was nonischemic.  I think that she can go home today and should follow-up with Dr. Lovena Le in one week.  Kerry Hough MD [facility]

## 2013-12-27 ENCOUNTER — Encounter: Payer: Self-pay | Admitting: Internal Medicine

## 2013-12-27 ENCOUNTER — Encounter (HOSPITAL_COMMUNITY): Payer: Self-pay | Admitting: Internal Medicine

## 2014-01-01 ENCOUNTER — Ambulatory Visit (INDEPENDENT_AMBULATORY_CARE_PROVIDER_SITE_OTHER): Payer: PRIVATE HEALTH INSURANCE | Admitting: Internal Medicine

## 2014-01-01 ENCOUNTER — Encounter: Payer: Self-pay | Admitting: Internal Medicine

## 2014-01-01 VITALS — BP 120/68 | HR 85 | Ht 64.5 in | Wt 221.2 lb

## 2014-01-01 DIAGNOSIS — R072 Precordial pain: Secondary | ICD-10-CM

## 2014-01-01 DIAGNOSIS — I10 Essential (primary) hypertension: Secondary | ICD-10-CM

## 2014-01-01 DIAGNOSIS — R55 Syncope and collapse: Secondary | ICD-10-CM

## 2014-01-01 LAB — MDC_IDC_ENUM_SESS_TYPE_INCLINIC

## 2014-01-01 MED ORDER — ISOSORBIDE MONONITRATE ER 60 MG PO TB24: 60.0000 mg | ORAL_TABLET | Freq: Every day | ORAL | Status: DC

## 2014-01-01 NOTE — Assessment & Plan Note (Signed)
Her blood pressure is controlled. No change in meds.

## 2014-01-01 NOTE — Assessment & Plan Note (Signed)
She has had no recurrent episodes since her ILR was placed. She will return in 6 months for device followup.

## 2014-01-01 NOTE — Assessment & Plan Note (Signed)
She has had a diagnosis of coronary spasm. I have asked the patient to restart her Imdur 60 mg daily. Hopefully she will not have problems with hypotension.

## 2014-01-01 NOTE — Progress Notes (Signed)
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  She was unable to walk and underwent stress perfusion imaging which was unremarkable. She also wore a cardiac monitor which demonstrated PACs and PVCs but no sustained arrhythmias. She has a h/o non-cardiac chest pain. 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 She had another episode of chest pain and was seen in the ER. Evaluation was unrevealing. She has been out of her Imdur. Several months ago she underwent insertion of an ILR due to unexplained syncope. She admits to being very sedentary because of back pain.  Allergies  Allergen Reactions  . Contrast Media [Iodinated Diagnostic Agents] Anaphylaxis  . Gadolinium Anaphylaxis       . Aspirin Nausea And Vomiting    Fast heart rate  . Iohexol Other (See Comments)    unknown     Current Outpatient Prescriptions  Medication Sig Dispense Refill  . acetaminophen (TYLENOL) 325 MG tablet Take 2 tablets (650 mg total) by mouth every 4 (four) hours as needed for headache or mild pain.    Marland Kitchen amitriptyline (ELAVIL) 25 MG tablet Take 1 tablet (25 mg total) by mouth at bedtime. 90 tablet 3  . atorvastatin (LIPITOR) 10 MG tablet Take 10 mg by mouth daily.     . cholecalciferol (VITAMIN D) 1000 UNITS tablet Take 1,000 Units by mouth daily.    . clopidogrel (PLAVIX) 75 MG tablet Take 75 mg by mouth daily.     . cycloSPORINE (RESTASIS) 0.05 % ophthalmic emulsion Place 1 drop into both eyes 2 (two) times daily.     Marland Kitchen DEXILANT 60 MG capsule Take 1 capsule by mouth daily.     . diazepam (VALIUM) 5 MG tablet Take 5 mg by mouth 2 (two) times daily. For muscle spasms    . diclofenac sodium (VOLTAREN) 1 % GEL Apply 1 application topically daily as needed (pain).     Marland Kitchen diltiazem (CARDIZEM CD) 180 MG 24 hr capsule TAKE 1 CAPSULE BY MOUTH ONCE DAILY 90 capsule 2  . diphenhydrAMINE (BENADRYL) 25  mg capsule Take 25 mg by mouth at bedtime.    . gabapentin (NEURONTIN) 100 MG capsule Take 1 capsule by mouth at bedtime.     Marland Kitchen glimepiride (AMARYL) 2 MG tablet Take 1 tablet (2 mg total) by mouth 2 (two) times daily. 30 tablet 0  . HYDROcodone-acetaminophen (NORCO/VICODIN) 5-325 MG per tablet Take 1 tablet by mouth every 4 (four) hours as needed for moderate pain.    . hyoscyamine (LEVBID) 0.375 MG 12 hr tablet Take 0.375 mg by mouth 2 (two) times daily.    . isosorbide mononitrate (IMDUR) 60 MG 24 hr tablet Take 60 mg by mouth 2 (two) times daily.     Marland Kitchen losartan (COZAAR) 100 MG tablet Take 100 mg by mouth daily.     . metFORMIN (GLUCOPHAGE) 1000 MG tablet Take 1 tablet (1,000 mg total) by mouth 2 (two) times daily with a meal. 30 tablet 0  . metoCLOPramide (REGLAN) 10 MG tablet Take one tab before each meal and at bedtime (Patient taking differently: Take one tablet by mouth before each meal and at bedtime) 120 tablet 2  . nitroGLYCERIN (NITROLINGUAL) 0.4 MG/SPRAY spray Place 1 spray under the tongue every 5 (five) minutes as needed. Chest pains      . Olopatadine HCl (PATADAY) 0.2 % SOLN Place 1 drop  into both eyes daily.     Marland Kitchen OVER THE COUNTER MEDICATION Take 1 capsule by mouth daily. Hair skin and Nail    . PROAIR HFA 108 (90 BASE) MCG/ACT inhaler Inhale 2 puffs into the lungs every 4 (four) hours as needed for shortness of breath.     . saxagliptin HCl (ONGLYZA) 5 MG TABS tablet Take 5 mg by mouth daily.     Marland Kitchen topiramate (TOPAMAX) 100 MG tablet TAKE 1 TABLET BY MOUTH EVERY EVENING. 90 tablet 1  . topiramate (TOPAMAX) 25 MG tablet TAKE 1 TABLET BY MOUTH EVERY MORNING. 90 tablet 1  . traMADol (ULTRAM) 50 MG tablet Take 50 mg by mouth every 6 (six) hours as needed (pain).     Marland Kitchen zolpidem (AMBIEN) 10 MG tablet Take 10 mg by mouth at bedtime.      No current facility-administered medications for this visit.     Past Medical History  Diagnosis Date  . Myocardial infarction 1992 & 1998    NM  Myocar Multi W/Spect W/Wall Motion EF on 08/07/10 - IMPRESSION: 1) No evidence of ischemia.  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  3) Normal LV wall motion.  4) Estimated Q G S EF 70%, unchanged.  . Asthma   . Blood transfusion   . GERD (gastroesophageal reflux disease)   . Hiatal hernia   . Other forms of migraine, without mention of intractable migraine without mention of status migrainosus   . Dry eye     Chronic  . Hypercholesteremia   . Spondylolisthesis   . Diverticulosis of colon (without mention of hemorrhage)     Colonoscopy 11/09/09 - FINDINGS: 1) 65mm polp in transverse colon, resected & retrieved.  2) Diverticulosis in sigmoid & descending colon.  3) Medium-sized, internal hemorrhoids.  . Internal hemorrhoids without mention of complication     Colonoscopy 11/09/09 - FINDINGS: 1) 22mm polp in transverse colon, resected & retrieved.  2) Diverticulosis in sigmoid & descending colon.  3) Medium-sized, internal hemorrhoids.  . Benign neoplasm of colon 11/09/09    Colonoscopy 11/09/09 - FINDINGS: 1) 58mm polp in transverse colon, resected & retrieved.  2) Diverticulosis in sigmoid & descending colon.  3) Medium-sized, internal hemorrhoids.  . Hemorrhage of rectum and anus     Colonoscopy 11/09/09 - FINDINGS: 1) 65mm polp in transverse colon, resected & retrieved.  2) Diverticulosis in sigmoid & descending colon.  3) Medium-sized, internal hemorrhoids.  . Essential hypertension, benign   . Esophagitis   . CMC arthritis     Left middle finger  . Depression     40+ years  . Diabetes mellitus 10/2008    type 2  . Breast cancer 1989    Left breast cancer  . Obese   . Syncope     MDT LinQ implanted for evaluation  . Facet arthropathy, lumbar     L3-4  . Obstructive sleep apnea     uses oxygen at night    ROS:   All systems reviewed and negative except as noted in the HPI.   Past Surgical History  Procedure Laterality Date  .  Abdominal hysterectomy  1978  . Joint replacement      lsft hip x2, rt knee  . Knee arthroscopy Right 2002    By Dr. Lorin Mercy  . Foot surgery Bilateral     Bil feet great toes and second toes spurs. By Dr. Milinda Pointer in 2004.  . Breast biopsy Left  1995    Biopsy at [facility] ([facility])  . Tonsillectomy    . Back surgery      x2 one for infection  . Cholecystectomy    . Total hip revision  12/14/2010    Procedure: TOTAL HIP REVISION;  Surgeon: Marybelle Killings;  Location: [place];  Service: Orthopedics;  Laterality: Left;  Left Total Hip Arthroplasty Revision, Poly and Ball Exchange, Possible Acetabular Revision vs. Poly Exchange  . Colonoscopy w/ polypectomy  11/09/09    FINDINGS: 1) 64mm polp in transverse colon, resected & retrieved.  2) Diverticulosis in sigmoid & descending colon.  3) Medium-sized, internal hemorrhoids.  . Loop recorder implant  11/15/06; 04/02/2013    MDT Reveal implanted 10/2006 for syncope; removed with MDT LinQ implanted 03/2013 by Dr Lovena Le  . Hemorrhoid surgery  2011    Internal & external hemorrhoidectomy (x) 3 quadrants  . Esophagogastroduodenoscopy  02/15/07  . Dilation and curettage of uterus    . Trigger finger release Left 01/02/2013    Procedure: RELEASE LEFT MIDDLE TRIGGER FINGER/LEFT INDEX AND LEFT RING FINGER;  Surgeon: Wynonia Sours, MD;  Location: [place];  Service: Orthopedics;  Laterality: Left;  ANESTHESIA: GENERAL/IV REGIONAL FAB  . Loop recorder implant N/A 04/02/2013    Procedure: LOOP RECORDER IMPLANT;  Surgeon: Evans Lance, MD;  Location: [facility] CATH LAB;  Service: Cardiovascular;  Laterality: N/A;     Family History  Problem Relation Age of Onset  . Pancreatic cancer Mother   . Heart attack Father   . Diabetes Son   . Arthritis Brother     Severe/Crippling  . Tuberculosis Brother   . Hypertension Brother     x 2  . Heart disease Sister   . Diabetes Brother   . Seizures Brother   . Autoimmune disease Brother      AIDS  . Colon cancer Brother   . Lung cancer Brother     mets     History   Social History  . Marital Status: Single    Spouse Name: N/A    Number of Children: 2  . Years of Education: college   Occupational History  . Retired    Social History Main Topics  . Smoking status: Former Smoker    Types: Cigarettes    Quit date: 01/19/1983  . Smokeless tobacco: Never Used  . Alcohol Use: No  . Drug Use: No  . Sexual Activity: Not on file   Other Topics Concern  . Not on file   Social History Narrative   Patient is single and lives alone.   Patient has two children.   Patient is retired.   Patient has a college education.   Patient is right-handed.   Patient does not drink any caffeine.     BP 120/68 mmHg  Pulse 85  Ht 5' 4.5" (1.638 m)  Wt 221 lb 3.2 oz (100.336 kg)  BMI 37.40 kg/m2  Physical Exam:  obese appearing 68 year old woman, NAD HEENT: Unremarkable Neck:  No JVD, no thyromegally Back:  No CVA tenderness Lungs:  Clear with no wheezes, rales, or rhonchi. HEART:  Regular rate rhythm, no murmurs, no rubs, no clicks Abd:  soft, positive bowel sounds, no organomegally, no rebound, no guarding Ext:  2 plus pulses, no edema, no cyanosis, no clubbing Skin:  No rashes no nodules Neuro:  CN II through XII intact, motor grossly intact  ILR interogation demonstrated no sustained SVT, atrial fib, VT, or bradycardic  episodes.   Assess/Plan:

## 2014-01-01 NOTE — Patient Instructions (Addendum)
Your physician wants you to follow-up in: 6 months with Dr Taylor You will receive a reminder letter in the mail two months in advance. If you don't receive a letter, please call our office to schedule the follow-up appointment.  

## 2014-01-02 ENCOUNTER — Telehealth: Payer: Self-pay | Admitting: Internal Medicine

## 2014-01-02 MED ORDER — LOSARTAN POTASSIUM 100 MG PO TABS: 100.0000 mg | ORAL_TABLET | Freq: Every day | ORAL | Status: DC

## 2014-01-02 NOTE — Telephone Encounter (Signed)
New Msg   Pt calling about a medication that was discussed yesterday.   Please call.

## 2014-01-02 NOTE — Telephone Encounter (Signed)
She is out of Losartan.  I have called in for her

## 2014-01-04 ENCOUNTER — Other Ambulatory Visit: Payer: Self-pay | Admitting: Gastroenterology

## 2014-01-04 ENCOUNTER — Encounter: Payer: Self-pay | Admitting: Internal Medicine

## 2014-01-09 ENCOUNTER — Ambulatory Visit (INDEPENDENT_AMBULATORY_CARE_PROVIDER_SITE_OTHER): Payer: PRIVATE HEALTH INSURANCE | Admitting: *Deleted

## 2014-01-09 DIAGNOSIS — R55 Syncope and collapse: Secondary | ICD-10-CM

## 2014-01-09 LAB — MDC_IDC_ENUM_SESS_TYPE_REMOTE: Date Time Interrogation Session: 20151230050500

## 2014-01-14 ENCOUNTER — Other Ambulatory Visit: Payer: Self-pay | Admitting: Neurology

## 2014-01-15 ENCOUNTER — Encounter: Payer: Self-pay | Admitting: Internal Medicine

## 2014-01-16 NOTE — Progress Notes (Signed)
Loop recorder 

## 2014-01-29 ENCOUNTER — Encounter: Payer: Self-pay | Admitting: Internal Medicine

## 2014-02-01 DIAGNOSIS — J45998 Other asthma: Secondary | ICD-10-CM | POA: Diagnosis not present

## 2014-02-04 ENCOUNTER — Other Ambulatory Visit: Payer: Self-pay | Admitting: Internal Medicine

## 2014-02-05 DIAGNOSIS — H40013 Open angle with borderline findings, low risk, bilateral: Secondary | ICD-10-CM | POA: Diagnosis not present

## 2014-02-08 ENCOUNTER — Ambulatory Visit (INDEPENDENT_AMBULATORY_CARE_PROVIDER_SITE_OTHER): Payer: Medicare Other | Admitting: *Deleted

## 2014-02-08 DIAGNOSIS — R55 Syncope and collapse: Secondary | ICD-10-CM

## 2014-02-08 LAB — MDC_IDC_ENUM_SESS_TYPE_REMOTE: Date Time Interrogation Session: 20160123050500

## 2014-02-14 ENCOUNTER — Encounter: Payer: Self-pay | Admitting: Internal Medicine

## 2014-02-14 NOTE — Progress Notes (Signed)
Loop recorder 

## 2014-02-15 ENCOUNTER — Ambulatory Visit: Payer: PRIVATE HEALTH INSURANCE | Admitting: Internal Medicine

## 2014-02-19 ENCOUNTER — Encounter: Payer: Self-pay | Admitting: Internal Medicine

## 2014-02-26 ENCOUNTER — Ambulatory Visit: Payer: Medicare Other | Admitting: Podiatry

## 2014-03-04 ENCOUNTER — Other Ambulatory Visit: Payer: Self-pay | Admitting: Internal Medicine

## 2014-03-04 DIAGNOSIS — J45998 Other asthma: Secondary | ICD-10-CM | POA: Diagnosis not present

## 2014-03-06 ENCOUNTER — Encounter: Payer: Self-pay | Admitting: Internal Medicine

## 2014-03-11 ENCOUNTER — Ambulatory Visit (INDEPENDENT_AMBULATORY_CARE_PROVIDER_SITE_OTHER): Payer: Medicare Other | Admitting: *Deleted

## 2014-03-11 DIAGNOSIS — R55 Syncope and collapse: Secondary | ICD-10-CM | POA: Diagnosis not present

## 2014-03-12 ENCOUNTER — Encounter: Payer: Self-pay | Admitting: Internal Medicine

## 2014-03-12 NOTE — Progress Notes (Signed)
Loop recorder 

## 2014-03-13 DIAGNOSIS — E119 Type 2 diabetes mellitus without complications: Secondary | ICD-10-CM | POA: Diagnosis not present

## 2014-03-13 DIAGNOSIS — E1165 Type 2 diabetes mellitus with hyperglycemia: Secondary | ICD-10-CM | POA: Diagnosis not present

## 2014-03-13 DIAGNOSIS — Z6836 Body mass index (BMI) 36.0-36.9, adult: Secondary | ICD-10-CM | POA: Diagnosis not present

## 2014-03-13 DIAGNOSIS — R000 Tachycardia, unspecified: Secondary | ICD-10-CM | POA: Diagnosis not present

## 2014-03-25 ENCOUNTER — Encounter: Payer: Self-pay | Admitting: Internal Medicine

## 2014-03-26 LAB — MDC_IDC_ENUM_SESS_TYPE_REMOTE: Date Time Interrogation Session: 20160227050500

## 2014-03-28 ENCOUNTER — Ambulatory Visit (INDEPENDENT_AMBULATORY_CARE_PROVIDER_SITE_OTHER): Payer: Medicare Other | Admitting: Podiatry

## 2014-03-28 ENCOUNTER — Encounter: Payer: Self-pay | Admitting: Podiatry

## 2014-03-28 DIAGNOSIS — M79673 Pain in unspecified foot: Secondary | ICD-10-CM | POA: Diagnosis not present

## 2014-03-28 DIAGNOSIS — B351 Tinea unguium: Secondary | ICD-10-CM | POA: Diagnosis not present

## 2014-03-28 NOTE — Patient Instructions (Signed)
Diabetes and Foot Care Diabetes may cause you to have problems because of poor blood supply (circulation) to your feet and legs. This may cause the skin on your feet to become thinner, break easier, and heal more slowly. Your skin may become dry, and the skin may peel and crack. You may also have nerve damage in your legs and feet causing decreased feeling in them. You may not notice minor injuries to your feet that could lead to infections or more serious problems. Taking care of your feet is one of the most important things you can do for yourself.  HOME CARE INSTRUCTIONS  Wear shoes at all times, even in the house. Do not go barefoot. Bare feet are easily injured.  Check your feet daily for blisters, cuts, and redness. If you cannot see the bottom of your feet, use a mirror or ask someone for help.  Wash your feet with warm water (do not use hot water) and mild soap. Then pat your feet and the areas between your toes until they are completely dry. Do not soak your feet as this can dry your skin.  Apply a moisturizing lotion or petroleum jelly (that does not contain alcohol and is unscented) to the skin on your feet and to dry, brittle toenails. Do not apply lotion between your toes.  Trim your toenails straight across. Do not dig under them or around the cuticle. File the edges of your nails with an emery board or nail file.  Do not cut corns or calluses or try to remove them with medicine.  Wear clean socks or stockings every day. Make sure they are not too tight. Do not wear knee-high stockings since they may decrease blood flow to your legs.  Wear shoes that fit properly and have enough cushioning. To break in new shoes, wear them for just a few hours a day. This prevents you from injuring your feet. Always look in your shoes before you put them on to be sure there are no objects inside.  Do not cross your legs. This may decrease the blood flow to your feet.  If you find a minor scrape,  cut, or break in the skin on your feet, keep it and the skin around it clean and dry. These areas may be cleansed with mild soap and water. Do not cleanse the area with peroxide, alcohol, or iodine.  When you remove an adhesive bandage, be sure not to damage the skin around it.  If you have a wound, look at it several times a day to make sure it is healing.  Do not use heating pads or hot water bottles. They may burn your skin. If you have lost feeling in your feet or legs, you may not know it is happening until it is too late.  Make sure your health care provider performs a complete foot exam at least annually or more often if you have foot problems. Report any cuts, sores, or bruises to your health care provider immediately. SEEK MEDICAL CARE IF:   You have an injury that is not healing.  You have cuts or breaks in the skin.  You have an ingrown nail.  You notice redness on your legs or feet.  You feel burning or tingling in your legs or feet.  You have pain or cramps in your legs and feet.  Your legs or feet are numb.  Your feet always feel cold. SEEK IMMEDIATE MEDICAL CARE IF:   There is increasing redness,   swelling, or pain in or around a wound.  There is a red line that goes up your leg.  Pus is coming from a wound.  You develop a fever or as directed by your health care provider.  You notice a bad smell coming from an ulcer or wound. Document Released: 01/02/2000 Document Revised: 09/06/2012 Document Reviewed: 06/13/2012 Exit[employer] Patient Information 2015 [employer]. This information is not intended to replace advice given to you by your health care provider. Make sure you discuss any questions you have with your health care provider.  

## 2014-03-28 NOTE — Progress Notes (Signed)
Presents today chief complaint of painful elongated toenails and a painful first metatarsophalangeal joint of the right foot. She states that the last time we gave her an injection there and lasted her for 6 months.  Objective: Pulses are palpable bilateral nails are thick, yellow dystrophic onychomycosis and painful palpation. She also has pain on palpation first metatarsophalangeal joint of the right foot were Keller arthroplasty was performed many years ago.  Assessment: Onychomycosis with pain in limb. Capsulitis first metatarsophalangeal joint right foot.  Plan: Treatment of nails in thickness and length as covered service secondary to pain. Injected first metatarsophalangeal joint after sterile Betadine skin prep with dexamethasone and local anesthetic.

## 2014-03-29 ENCOUNTER — Encounter: Payer: Self-pay | Admitting: Internal Medicine

## 2014-04-01 ENCOUNTER — Other Ambulatory Visit: Payer: Self-pay | Admitting: Internal Medicine

## 2014-04-01 DIAGNOSIS — M059 Rheumatoid arthritis with rheumatoid factor, unspecified: Secondary | ICD-10-CM | POA: Diagnosis not present

## 2014-04-02 ENCOUNTER — Other Ambulatory Visit: Payer: Self-pay

## 2014-04-02 DIAGNOSIS — J45998 Other asthma: Secondary | ICD-10-CM | POA: Diagnosis not present

## 2014-04-02 DIAGNOSIS — M059 Rheumatoid arthritis with rheumatoid factor, unspecified: Secondary | ICD-10-CM | POA: Diagnosis not present

## 2014-04-02 MED ORDER — NITROGLYCERIN 0.4 MG/SPRAY TL SOLN: 1.0000 | Status: DC | PRN

## 2014-04-04 DIAGNOSIS — M059 Rheumatoid arthritis with rheumatoid factor, unspecified: Secondary | ICD-10-CM | POA: Diagnosis not present

## 2014-04-05 DIAGNOSIS — M059 Rheumatoid arthritis with rheumatoid factor, unspecified: Secondary | ICD-10-CM | POA: Diagnosis not present

## 2014-04-08 DIAGNOSIS — M059 Rheumatoid arthritis with rheumatoid factor, unspecified: Secondary | ICD-10-CM | POA: Diagnosis not present

## 2014-04-09 ENCOUNTER — Encounter: Payer: Self-pay | Admitting: Internal Medicine

## 2014-04-09 ENCOUNTER — Ambulatory Visit (INDEPENDENT_AMBULATORY_CARE_PROVIDER_SITE_OTHER): Payer: Medicare Other | Admitting: *Deleted

## 2014-04-09 DIAGNOSIS — M059 Rheumatoid arthritis with rheumatoid factor, unspecified: Secondary | ICD-10-CM | POA: Diagnosis not present

## 2014-04-09 DIAGNOSIS — R55 Syncope and collapse: Secondary | ICD-10-CM

## 2014-04-12 NOTE — Progress Notes (Signed)
Loop recorder 

## 2014-04-16 LAB — MDC_IDC_ENUM_SESS_TYPE_REMOTE: Date Time Interrogation Session: 20160319040500

## 2014-04-18 ENCOUNTER — Encounter: Payer: Self-pay | Admitting: Internal Medicine

## 2014-04-19 ENCOUNTER — Encounter: Payer: Self-pay | Admitting: Internal Medicine

## 2014-04-22 DIAGNOSIS — M059 Rheumatoid arthritis with rheumatoid factor, unspecified: Secondary | ICD-10-CM | POA: Diagnosis not present

## 2014-04-23 DIAGNOSIS — M059 Rheumatoid arthritis with rheumatoid factor, unspecified: Secondary | ICD-10-CM | POA: Diagnosis not present

## 2014-04-24 ENCOUNTER — Encounter: Payer: Self-pay | Admitting: Gastroenterology

## 2014-04-25 DIAGNOSIS — M059 Rheumatoid arthritis with rheumatoid factor, unspecified: Secondary | ICD-10-CM | POA: Diagnosis not present

## 2014-04-26 DIAGNOSIS — M059 Rheumatoid arthritis with rheumatoid factor, unspecified: Secondary | ICD-10-CM | POA: Diagnosis not present

## 2014-04-29 ENCOUNTER — Other Ambulatory Visit: Payer: Self-pay | Admitting: Internal Medicine

## 2014-04-29 DIAGNOSIS — L299 Pruritus, unspecified: Secondary | ICD-10-CM | POA: Diagnosis not present

## 2014-04-29 DIAGNOSIS — M059 Rheumatoid arthritis with rheumatoid factor, unspecified: Secondary | ICD-10-CM | POA: Diagnosis not present

## 2014-04-29 DIAGNOSIS — M542 Cervicalgia: Secondary | ICD-10-CM | POA: Diagnosis not present

## 2014-04-30 ENCOUNTER — Other Ambulatory Visit: Payer: Self-pay | Admitting: Otolaryngology

## 2014-04-30 DIAGNOSIS — M542 Cervicalgia: Secondary | ICD-10-CM

## 2014-05-02 ENCOUNTER — Encounter (HOSPITAL_COMMUNITY): Payer: Self-pay | Admitting: Emergency Medicine

## 2014-05-02 ENCOUNTER — Emergency Department (INDEPENDENT_AMBULATORY_CARE_PROVIDER_SITE_OTHER)
Admission: EM | Admit: 2014-05-02 | Discharge: 2014-05-02 | Disposition: A | Discharge status: Home / Self Care | Payer: Medicare Other | Source: Home / Self Care | Attending: Family Medicine | Admitting: Family Medicine

## 2014-05-02 DIAGNOSIS — M059 Rheumatoid arthritis with rheumatoid factor, unspecified: Secondary | ICD-10-CM | POA: Diagnosis not present

## 2014-05-02 DIAGNOSIS — R7303 Prediabetes: Secondary | ICD-10-CM

## 2014-05-02 DIAGNOSIS — J45901 Unspecified asthma with (acute) exacerbation: Secondary | ICD-10-CM

## 2014-05-02 DIAGNOSIS — R0982 Postnasal drip: Secondary | ICD-10-CM

## 2014-05-02 DIAGNOSIS — R7309 Other abnormal glucose: Secondary | ICD-10-CM

## 2014-05-02 MED ORDER — IPRATROPIUM-ALBUTEROL 0.5-2.5 (3) MG/3ML IN SOLN
3.0000 mL | Freq: Once | RESPIRATORY_TRACT | Status: AC
  Administered 2014-05-02: 3 mL via RESPIRATORY_TRACT

## 2014-05-02 MED ORDER — METHYLPREDNISOLONE SODIUM SUCC 125 MG IJ SOLR
INTRAMUSCULAR | Status: AC
  Filled 2014-05-02: qty 2

## 2014-05-02 MED ORDER — SPACER/AERO-HOLDING CHAMBERS DEVI: Status: DC

## 2014-05-02 MED ORDER — METHYLPREDNISOLONE SODIUM SUCC 125 MG IJ SOLR
125.0000 mg | Freq: Once | INTRAMUSCULAR | Status: AC
  Administered 2014-05-02: 125 mg via INTRAMUSCULAR

## 2014-05-02 MED ORDER — IPRATROPIUM BROMIDE 0.06 % NA SOLN: 2.0000 | Freq: Four times a day (QID) | NASAL | Status: DC

## 2014-05-02 MED ORDER — PREDNISONE 50 MG PO TABS: ORAL_TABLET | ORAL | Status: DC

## 2014-05-02 MED ORDER — IPRATROPIUM-ALBUTEROL 0.5-2.5 (3) MG/3ML IN SOLN
RESPIRATORY_TRACT | Status: AC
  Filled 2014-05-02: qty 3

## 2014-05-02 NOTE — Discharge Instructions (Signed)
You are experiencing an asthma flare. This was treated with steroids and a breathing treatment in our clinic. Please take steroids every morning with breakfast. Please use your albuterol inhaler every 4 hours for the next 24 hours and then as needed. Please start the nasal Atrovent spray to help dry up the runny nose. Please consider taking a daily allergy pill. Please monitor your sugar closely over the next 5 days while you're on steroids.

## 2014-05-02 NOTE — ED Notes (Signed)
C/o  Wheezing and sob for over a wk.  Pt has a hx of asthma.  Having no relief with albuterol.   Denies any other symptoms.

## 2014-05-02 NOTE — ED Provider Notes (Signed)
CSN: [id number]     Arrival date & time 05/02/14  1516 History   First MD Initiated Contact with Patient 05/02/14 1643     Chief Complaint  Patient presents with  . Asthma   (Consider location/radiation/quality/duration/timing/severity/associated sxs/prior Treatment) HPI  SOB started a couple of weeks ago. Started after spending the weekend in a smokers home who also has a dog. Both of which pt is alergic to. SOB intermittent at first  But now constant. Getting worse. Now w/ cough. Denies fevers, CP, HA, syncope, n/v/d/c, back pain. Inh w/o improvement. Increased home o2 last night to 3 L. Uses O2 at night.     Past Medical History  Diagnosis Date  . Myocardial infarction 1992 & 1998    NM Myocar Multi W/Spect W/Wall Motion EF on 08/07/10 - IMPRESSION: 1) No evidence of ischemia.  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  3) Normal LV wall motion.  4) Estimated Q G S EF 70%, unchanged.  . Asthma   . Blood transfusion   . GERD (gastroesophageal reflux disease)   . Hiatal hernia   . Other forms of migraine, without mention of intractable migraine without mention of status migrainosus   . Dry eye     Chronic  . Hypercholesteremia   . Spondylolisthesis   . Diverticulosis of colon (without mention of hemorrhage)     Colonoscopy 11/09/09 - FINDINGS: 1) 70mm polp in transverse colon, resected & retrieved.  2) Diverticulosis in sigmoid & descending colon.  3) Medium-sized, internal hemorrhoids.  . Internal hemorrhoids without mention of complication     Colonoscopy 11/09/09 - FINDINGS: 1) 47mm polp in transverse colon, resected & retrieved.  2) Diverticulosis in sigmoid & descending colon.  3) Medium-sized, internal hemorrhoids.  . Benign neoplasm of colon 11/09/09    Colonoscopy 11/09/09 - FINDINGS: 1) 4mm polp in transverse colon, resected & retrieved.  2) Diverticulosis in sigmoid & descending colon.  3) Medium-sized, internal hemorrhoids.  .  Hemorrhage of rectum and anus     Colonoscopy 11/09/09 - FINDINGS: 1) 1mm polp in transverse colon, resected & retrieved.  2) Diverticulosis in sigmoid & descending colon.  3) Medium-sized, internal hemorrhoids.  . Essential hypertension, benign   . Esophagitis   . CMC arthritis     Left middle finger  . Depression     40+ years  . Diabetes mellitus 10/2008    type 2  . Breast cancer 1989    Left breast cancer  . Obese   . Syncope     MDT LinQ implanted for evaluation  . Facet arthropathy, lumbar     L3-4  . Obstructive sleep apnea     uses oxygen at night   Past Surgical History  Procedure Laterality Date  . Abdominal hysterectomy  1978  . Joint replacement      lsft hip x2, rt knee  . Knee arthroscopy Right 2002    By Dr. Lorin Mercy  . Foot surgery Bilateral     Bil feet great toes and second toes spurs. By Dr. Milinda Pointer in 2004.  . Breast biopsy Left 1995    [facility]  . Tonsillectomy    . Back surgery      x2 one for infection  . Cholecystectomy    . Total hip revision  12/14/2010    Procedure: TOTAL HIP REVISION;  Surgeon: Marybelle Killings;  Location: [place];  Service: Orthopedics;  Laterality: Left;  Left Total Hip Arthroplasty Revision, Poly and Ball Exchange, Possible Acetabular Revision vs. Poly Exchange  . Colonoscopy w/ polypectomy  11/09/09    FINDINGS: 1) 70mm polp in transverse colon, resected & retrieved.  2) Diverticulosis in sigmoid & descending colon.  3) Medium-sized, internal hemorrhoids.  . Loop recorder implant  11/15/06; 04/02/2013    MDT Reveal implanted 10/2006 for syncope; removed with MDT LinQ implanted 03/2013 by Dr Lovena Le  . Hemorrhoid surgery  2011    Internal & external hemorrhoidectomy (x) 3 quadrants  . Esophagogastroduodenoscopy  02/15/07  . Dilation and curettage of uterus    . Trigger finger release Left 01/02/2013    Procedure: RELEASE LEFT MIDDLE TRIGGER FINGER/LEFT INDEX AND LEFT RING FINGER;  Surgeon: Wynonia Sours, MD;  Location: [place];  Service: Orthopedics;  Laterality: Left;  ANESTHESIA: GENERAL/IV REGIONAL FAB  . Loop recorder implant N/A 04/02/2013    Procedure: LOOP RECORDER IMPLANT;  Surgeon: Evans Lance, MD;  Location: [facility] C[facility] LAB;  Service: Cardiovascular;  Laterality: N/A;   Family History  Problem Relation Age of Onset  . Pancreatic cancer Mother   . Heart attack Father   . Diabetes Son   . Arthritis Brother     Severe/Crippling  . Tuberculosis Brother   . Hypertension Brother     x 2  . Heart disease Sister   . Diabetes Brother   . Seizures Brother   . Autoimmune disease Brother     AIDS  . Colon cancer Brother   . Lung cancer Brother     mets   History  Substance Use Topics  . Smoking status: Former Smoker    Types: Cigarettes    Quit date: 01/19/1983  . Smokeless tobacco: Never Used  . Alcohol Use: No   OB History    No data available     Review of Systems  Allergies  Contrast media; Gadolinium; Aspirin; and Iohexol  Home Medications   Prior to Admission medications   Medication Sig Start Date End Date Taking? Authorizing Provider  amitriptyline (ELAVIL) 25 MG tablet Take one table by mouth every night at bedtime. 04/29/14  Yes Olga Millers, MD  atorvastatin (LIPITOR) 10 MG tablet Take 10 mg by mouth daily.    Yes Historical Provider, MD  cholecalciferol (VITAMIN D) 1000 UNITS tablet Take 1,000 Units by mouth daily.   Yes Historical Provider, MD  clopidogrel (PLAVIX) 75 MG tablet Take 75 mg by mouth daily.  07/20/12  Yes Historical Provider, MD  DEXILANT 60 MG capsule Take 1 capsule by mouth daily.  01/30/13  Yes Historical Provider, MD  diazepam (VALIUM) 5 MG tablet Take 5 mg by mouth 2 (two) times daily. For muscle spasms   Yes Historical Provider, MD  diltiazem (CARDIZEM CD) 180 MG 24 hr capsule TAKE 1 CAPSULE BY MOUTH ONCE DAILY 03/21/14  Yes Olga Millers, MD  gabapentin (NEURONTIN) 100 MG capsule Take 1 capsule by mouth at  bedtime.  06/18/13  Yes Historical Provider, MD  glimepiride (AMARYL) 2 MG tablet Take 1 tablet (2 mg total) by mouth 2 (two) times daily. 12/15/10  Yes Phillips Hay, PA-C  hyoscyamine (LEVBID) 0.375 MG 12 hr tablet Take 0.375 mg by mouth 2 (two) times daily.   Yes Historical Provider, MD  isosorbide mononitrate (IMDUR) 60 MG 24 hr tablet Take 1 tablet (60 mg total) by mouth daily. 01/01/14  Yes Evans Lance, MD  losartan (COZAAR) 100 MG tablet Take 1 tablet (  100 mg total) by mouth daily. 01/02/14  Yes Evans Lance, MD  metFORMIN (GLUCOPHAGE) 1000 MG tablet Take 1 tablet (1,000 mg total) by mouth 2 (two) times daily with a meal. 12/15/10  Yes Phillips Hay, PA-C  metoCLOPramide (REGLAN) 10 MG tablet Take one tab before each meal and at bedtime Patient taking differently: Take one tablet by mouth before each meal and at bedtime 07/27/13  Yes Inda Castle, MD  metoCLOPramide (REGLAN) 10 MG tablet TAKE 1 TABLET BY MOUTH BEFORE [facility] MEAL AND AT BEDTIME 01/04/14  Yes Inda Castle, MD  metoCLOPramide (REGLAN) 10 MG tablet TAKE 1 TABLET BY MOUTH BEFORE [facility] MEAL AND AT BEDTIME 03/21/14  Yes Olga Millers, MD  topiramate (TOPAMAX) 100 MG tablet TAKE 1 TABLET BY MOUTH EVERY EVENING. 03/21/14  Yes Olga Millers, MD  zolpidem (AMBIEN) 10 MG tablet Take 10 mg by mouth at bedtime.  06/21/12  Yes Historical Provider, MD  acetaminophen (TYLENOL) 325 MG tablet Take 2 tablets (650 mg total) by mouth every 4 (four) hours as needed for headache or mild pain. 12/22/13   Erlene Quan, PA-C  cycloSPORINE (RESTASIS) 0.05 % ophthalmic emulsion Place 1 drop into both eyes 2 (two) times daily.     Historical Provider, MD  DEXILANT 60 MG capsule TAKE 1 CAPSULE BY MOUTH ONCE DAILY 04/01/14   Olga Millers, MD  diclofenac sodium (VOLTAREN) 1 % GEL Apply 1 application topically daily as needed (pain).     Historical Provider, MD  diphenhydrAMINE (BENADRYL) 25 mg capsule Take 25 mg by mouth at bedtime.     Historical Provider, MD  HYDROcodone-acetaminophen (NORCO/VICODIN) 5-325 MG per tablet Take 1 tablet by mouth every 4 (four) hours as needed for moderate pain. 03/22/13   Jola Schmidt, MD  metoCLOPramide (REGLAN) 10 MG tablet TAKE 1 TABLET BY MOUTH BEFORE [facility] MEAL AND AT BEDTIME 04/01/14   Olga Millers, MD  nitroGLYCERIN (NITROLINGUAL) 0.4 MG/SPRAY spray Place 1 spray under the tongue every 5 (five) minutes as needed. Chest pains 04/02/14   Evans Lance, MD  Olopatadine HCl (PATADAY) 0.2 % SOLN Place 1 drop into both eyes daily.     Historical Provider, MD  OVER THE COUNTER MEDICATION Take 1 capsule by mouth daily. Hair skin and Nail    Historical Provider, MD  PROAIR HFA 108 (90 BASE) MCG/ACT inhaler Inhale 2 puffs into the lungs every 4 (four) hours as needed for shortness of breath.  07/20/12   Historical Provider, MD  saxagliptin HCl (ONGLYZA) 5 MG TABS tablet Take 5 mg by mouth daily.     Historical Provider, MD  topiramate (TOPAMAX) 25 MG tablet TAKE 1 TABLET BY MOUTH EVERY MORNING. 03/21/14   Olga Millers, MD  traMADol (ULTRAM) 50 MG tablet Take 50 mg by mouth every 6 (six) hours as needed (pain).  07/25/12   Historical Provider, MD   BP 151/84 mmHg  Pulse 90  Temp(Src) 97.8 F (36.6 C) (Oral)  Resp 20  SpO2 97% Physical Exam Physical Exam  Constitutional: oriented to person, place, and time. appears well-developed and well-nourished. No distress.  HENT:  Head: Normocephalic and atraumatic.  Eyes: EOMI. PERRL.  Neck: Normal range of motion.  Cardiovascular: RRR, no m/r/g, 2+ distal pulses,  Pulmonary/Chest: Minimal tightness on auscultation of the lungs in the posterior lung fields. No appreciable wheezing but patient with large body habitus which makes this difficult. Patient much improved after DuoNeb and Solu-Medrol.  Abdominal: Soft. Bowel  sounds are normal. NonTTP, no distension.  Musculoskeletal: Normal range of motion. Non ttp, no effusion.  Neurological: alert and  oriented to person, place, and time.  Skin: Skin is warm. No rash noted. non diaphoretic.  Psychiatric: normal mood and affect. behavior is normal. Judgment and thought content normal.   ED Course  Procedures (including critical care time) Labs Review Labs Reviewed - No data to display  Imaging Review No results found.   MDM   1. Asthma flare   2. Pre-diabetes   3. Post-nasal drip     Much improved after Solu-Medrol and 25 mg IM and DuoNeb. Start nasal Atrovent and daily allergy medicine. Patient to prediabetic and will monitor glucose closely as she is on steroids. Start steroid Dosepak and albuterol every 4 hours for the next 24 hours. Patient also prescribed a spacer to assist with albuterol administration with inhaler. Gradual onset with likely inciting incident so other differential diagnoses such as pulmonary was more exceptionally unlikely.  Waldemar Dickens, MD 05/02/14 [phone]

## 2014-05-03 ENCOUNTER — Ambulatory Visit
Admission: RE | Admit: 2014-05-03 | Discharge: 2014-05-03 | Disposition: A | Discharge status: Home / Self Care | Payer: Medicare Other | Source: Ambulatory Visit | Attending: Otolaryngology | Admitting: Otolaryngology

## 2014-05-03 DIAGNOSIS — J45998 Other asthma: Secondary | ICD-10-CM | POA: Diagnosis not present

## 2014-05-03 DIAGNOSIS — M542 Cervicalgia: Secondary | ICD-10-CM

## 2014-05-03 DIAGNOSIS — M059 Rheumatoid arthritis with rheumatoid factor, unspecified: Secondary | ICD-10-CM | POA: Diagnosis not present

## 2014-05-06 ENCOUNTER — Encounter: Payer: Self-pay | Admitting: Internal Medicine

## 2014-05-06 DIAGNOSIS — M059 Rheumatoid arthritis with rheumatoid factor, unspecified: Secondary | ICD-10-CM | POA: Diagnosis not present

## 2014-05-07 DIAGNOSIS — M059 Rheumatoid arthritis with rheumatoid factor, unspecified: Secondary | ICD-10-CM | POA: Diagnosis not present

## 2014-05-09 ENCOUNTER — Ambulatory Visit (INDEPENDENT_AMBULATORY_CARE_PROVIDER_SITE_OTHER): Payer: Medicare Other | Admitting: *Deleted

## 2014-05-09 DIAGNOSIS — R55 Syncope and collapse: Secondary | ICD-10-CM | POA: Diagnosis not present

## 2014-05-09 DIAGNOSIS — M059 Rheumatoid arthritis with rheumatoid factor, unspecified: Secondary | ICD-10-CM | POA: Diagnosis not present

## 2014-05-10 ENCOUNTER — Ambulatory Visit: Payer: Medicaid Other | Admitting: Internal Medicine

## 2014-05-10 DIAGNOSIS — M059 Rheumatoid arthritis with rheumatoid factor, unspecified: Secondary | ICD-10-CM | POA: Diagnosis not present

## 2014-05-10 NOTE — Progress Notes (Signed)
Loop recorder 

## 2014-05-13 DIAGNOSIS — M059 Rheumatoid arthritis with rheumatoid factor, unspecified: Secondary | ICD-10-CM | POA: Diagnosis not present

## 2014-05-14 ENCOUNTER — Ambulatory Visit (INDEPENDENT_AMBULATORY_CARE_PROVIDER_SITE_OTHER): Payer: Medicare Other | Admitting: Internal Medicine

## 2014-05-14 ENCOUNTER — Encounter: Payer: Self-pay | Admitting: Internal Medicine

## 2014-05-14 ENCOUNTER — Other Ambulatory Visit (INDEPENDENT_AMBULATORY_CARE_PROVIDER_SITE_OTHER): Payer: Medicare Other

## 2014-05-14 VITALS — BP 134/76 | HR 98 | Temp 98.4°F | Resp 16 | Ht 64.0 in | Wt 220.6 lb

## 2014-05-14 DIAGNOSIS — E669 Obesity, unspecified: Secondary | ICD-10-CM

## 2014-05-14 DIAGNOSIS — J4520 Mild intermittent asthma, uncomplicated: Secondary | ICD-10-CM | POA: Diagnosis not present

## 2014-05-14 DIAGNOSIS — E119 Type 2 diabetes mellitus without complications: Secondary | ICD-10-CM | POA: Diagnosis not present

## 2014-05-14 DIAGNOSIS — E1169 Type 2 diabetes mellitus with other specified complication: Secondary | ICD-10-CM

## 2014-05-14 DIAGNOSIS — E785 Hyperlipidemia, unspecified: Secondary | ICD-10-CM

## 2014-05-14 DIAGNOSIS — I10 Essential (primary) hypertension: Secondary | ICD-10-CM | POA: Diagnosis not present

## 2014-05-14 DIAGNOSIS — M059 Rheumatoid arthritis with rheumatoid factor, unspecified: Secondary | ICD-10-CM | POA: Diagnosis not present

## 2014-05-14 DIAGNOSIS — G43809 Other migraine, not intractable, without status migrainosus: Secondary | ICD-10-CM

## 2014-05-14 LAB — VITAMIN D 25 HYDROXY (VIT D DEFICIENCY, FRACTURES): VITD: 37.81 ng/mL (ref 30.00–100.00)

## 2014-05-14 LAB — COMPREHENSIVE METABOLIC PANEL
ALBUMIN: 4.3 g/dL (ref 3.5–5.2)
ALT: 12 U/L (ref 0–35)
AST: 9 U/L (ref 0–37)
Alkaline Phosphatase: 69 U/L (ref 39–117)
BUN: 9 mg/dL (ref 6–23)
CHLORIDE: 106 meq/L (ref 96–112)
CO2: 27 meq/L (ref 19–32)
Calcium: 9.8 mg/dL (ref 8.4–10.5)
Creatinine, Ser: 0.89 mg/dL (ref 0.40–1.20)
GFR: 81.34 mL/min (ref >60.00)
Glucose, Bld: 84 mg/dL (ref 70–99)
Potassium: 3.7 mEq/L (ref 3.5–5.1)
SODIUM: 141 meq/L (ref 135–145)
Total Bilirubin: 0.3 mg/dL (ref 0.2–1.2)
Total Protein: 7.3 g/dL (ref 6.0–8.3)

## 2014-05-14 LAB — HEMOGLOBIN A1C: HEMOGLOBIN A1C: 6.4 % (ref 4.6–6.5)

## 2014-05-14 LAB — FERRITIN: Ferritin: 23.5 ng/mL (ref 10.0–291.0)

## 2014-05-14 MED ORDER — GABAPENTIN 100 MG PO CAPS: 100.0000 mg | ORAL_CAPSULE | Freq: Every day | ORAL | Status: DC

## 2014-05-14 MED ORDER — CLOPIDOGREL BISULFATE 75 MG PO TABS: 75.0000 mg | ORAL_TABLET | Freq: Every day | ORAL | Status: DC

## 2014-05-14 MED ORDER — DIAZEPAM 5 MG PO TABS: 5.0000 mg | ORAL_TABLET | Freq: Two times a day (BID) | ORAL | Status: DC

## 2014-05-14 MED ORDER — FLUTICASONE-SALMETEROL 115-21 MCG/ACT IN AERO: 2.0000 | INHALATION_SPRAY | Freq: Two times a day (BID) | RESPIRATORY_TRACT | Status: DC

## 2014-05-14 NOTE — Patient Instructions (Signed)
We will check on the labs today and call you back with the results.   We will start a new medicine for the breathing that you take twice a day called advair. Take 2 puffs twice a day for the breathing. Take it whether you are doing well or not. This is not a rescue inhaler and does not make you feel better right at the moment. You can keep the albuterol and if you are having problems with the breathing. Use it only when you are having problems.   Come back in about 3 months so we can check on the diabetes and make any changes with the breathing. If your breathing is not doing better please call us sooner about the breathing if it is not better in a week.   Asthma Asthma is a condition of the lungs in which the airways tighten and narrow. Asthma can make it hard to breathe. Asthma cannot be cured, but medicine and lifestyle changes can help control it. Asthma may be started (triggered) by:  Animal skin flakes (dander).  Dust.  Cockroaches.  Pollen.  Mold.  Smoke.  Cleaning products.  Hair sprays or aerosol sprays.  Paint fumes or strong smells.  Cold air, weather changes, and winds.  Crying or laughing hard.  Stress.  Certain medicines or drugs.  Foods, such as dried fruit, potato chips, and sparkling grape juice.  Infections or conditions (colds, flu).  Exercise.  Certain medical conditions or diseases.  Exercise or tiring activities. HOME CARE   Take medicine as told by your doctor.  Use a peak flow meter as told by your doctor. A peak flow meter is a tool that measures how well the lungs are working.  Record and keep track of the peak flow meter's readings.  Understand and use the asthma action plan. An asthma action plan is a written plan for taking care of your asthma and treating your attacks.  To help prevent asthma attacks:  Do not smoke. Stay away from secondhand smoke.  Change your heating and air conditioning filter often.  Limit your use of  fireplaces and wood stoves.  Get rid of pests (such as roaches and mice) and their droppings.  Throw away plants if you see mold on them.  Clean your floors. Dust regularly. Use cleaning products that do not smell.  Have someone vacuum when you are not home. Use a vacuum cleaner with a HEPA filter if possible.  Replace carpet with wood, tile, or vinyl flooring. Carpet can trap animal skin flakes and dust.  Use allergy-proof pillows, mattress covers, and box spring covers.  Wash bed sheets and blankets every week in hot water and dry them in a dryer.  Use blankets that are made of polyester or cotton.  Clean bathrooms and kitchens with bleach. If possible, have someone repaint the walls in these rooms with mold-resistant paint. Keep out of the rooms that are being cleaned and painted.  Wash hands often. GET HELP IF:  You have make a whistling sound when breaking (wheeze), have shortness of breath, or have a cough even if taking medicine to prevent attacks.  The colored mucus you cough up (sputum) is thicker than usual.  The colored mucus you cough up changes from clear or white to yellow, green, gray, or bloody.  You have problems from the medicine you are taking such as:  A rash.  Itching.  Swelling.  Trouble breathing.  You need reliever medicines more than 2-3 times a week.  Your peak flow measurement is still at 50-79% of your personal best after following the action plan for 1 hour.  You have a fever. GET HELP RIGHT AWAY IF:   You seem to be worse and are not responding to medicine during an asthma attack.  You are short of breath even at rest.  You get short of breath when doing very little activity.  You have trouble eating, drinking, or talking.  You have chest pain.  You have a fast heartbeat.  Your lips or fingernails start to turn blue.  You are light-headed, dizzy, or faint.  Your peak flow is less than 50% of your personal best. MAKE SURE  YOU:   Understand these instructions.  Will watch your condition.  Will get help right away if you are not doing well or get worse. Document Released: 06/23/2007 Document Revised: 05/21/2013 Document Reviewed: 08/03/2012 [facility] Patient Information 2015 [place], [place]. This information is not intended to replace advice given to you by your health care provider. Make sure you discuss any questions you have with your health care provider.

## 2014-05-14 NOTE — Progress Notes (Signed)
Pre visit review using our clinic review tool, if applicable. No additional management support is needed unless otherwise documented below in the visit note. 

## 2014-05-16 ENCOUNTER — Encounter: Payer: Self-pay | Admitting: Internal Medicine

## 2014-05-16 NOTE — Progress Notes (Signed)
   Subjective:    Patient ID: [name], female    DOB: 1945-01-22, 69 y.o.   MRN: [id number]  HPI The patient is a new 69 YO female who is coming in for worsening asthma. She spent a weekend with a smoker and a dog both of which set off her asthma. She does not have a regular doctor right now and this is keeping her from caring for her breathing well. She denies SOB with anything but strenuous exertion. Has to stop after 3 flights of stairs but can make it up. Denies night time symptoms but is using her inhaler more often lately. Denies significant cough or sputum production. Denies fevers or chills. See A/P for current status and plan for chronic medical conditions.   PMH, [facility], social history reviewed and updated with patient.   Review of Systems  Constitutional: Positive for activity change. Negative for fever, appetite change and fatigue.  HENT: Positive for congestion and postnasal drip. Negative for ear discharge, ear pain and sinus pressure.   Eyes: Negative.   Respiratory: Positive for shortness of breath and wheezing. Negative for cough and chest tightness.   Cardiovascular: Negative for chest pain, palpitations and leg swelling.  Gastrointestinal: Negative.   Musculoskeletal: Negative.   Skin: Negative.   Neurological: Negative.   Psychiatric/Behavioral: Positive for sleep disturbance. Negative for decreased concentration and agitation. The patient is not nervous/anxious.       Objective:   Physical Exam  Constitutional: She is oriented to person, place, and time. She appears well-developed and well-nourished.  HENT:  Head: Normocephalic and atraumatic.  Eyes: EOM are normal.  Neck: Normal range of motion.  Cardiovascular: Normal rate and regular rhythm.   Pulmonary/Chest: Effort normal. No respiratory distress. She has no wheezes. She has no rales.  No wheezing on exam and moving good volumes of air.   Abdominal: Soft. She exhibits no distension. There is no tenderness.    Musculoskeletal: She exhibits no edema.  Neurological: She is alert and oriented to person, place, and time. Coordination normal.  Skin: Skin is warm and dry.  Psychiatric: She has a normal mood and affect.   Filed Vitals:   05/14/14 1026  BP: 134/76  Pulse: 98  Temp: 98.4 F (36.9 C)  TempSrc: Oral  Resp: 16  Height: 5\' 4"  (1.626 m)  Weight: 220 lb 9.6 oz (100.064 kg)  SpO2: 97%      Assessment & Plan:

## 2014-05-17 ENCOUNTER — Encounter: Payer: Self-pay | Admitting: Internal Medicine

## 2014-05-17 DIAGNOSIS — M25562 Pain in left knee: Secondary | ICD-10-CM | POA: Diagnosis not present

## 2014-05-17 DIAGNOSIS — R0781 Pleurodynia: Secondary | ICD-10-CM | POA: Diagnosis not present

## 2014-05-17 DIAGNOSIS — J45909 Unspecified asthma, uncomplicated: Secondary | ICD-10-CM | POA: Insufficient documentation

## 2014-05-17 DIAGNOSIS — M25511 Pain in right shoulder: Secondary | ICD-10-CM | POA: Diagnosis not present

## 2014-05-17 DIAGNOSIS — K210 Gastro-esophageal reflux disease with esophagitis: Secondary | ICD-10-CM | POA: Diagnosis not present

## 2014-05-17 DIAGNOSIS — M25512 Pain in left shoulder: Secondary | ICD-10-CM | POA: Diagnosis not present

## 2014-05-17 NOTE — Assessment & Plan Note (Addendum)
She is currently taking glimiperide and saxagliptin/metformin combo pill. Will continue and check HgA1c. No time to do foot exam today but will do at next visit. She is on ARB therapy. Complicated by gastroparesis and taking reglan for that.

## 2014-05-17 NOTE — Assessment & Plan Note (Signed)
Exacerbated by allergies. She is worsened now and likely needs preventative advair during her allergy season to keep her from needing inhaler rescue all the time. Sent in rx for albuterol and advair today. No overt wheezing on exam and moving good air so no steroid indication or antibiotics.

## 2014-05-17 NOTE — Assessment & Plan Note (Signed)
She is taking topiramate for prevention of her migraines which seems to be doing an okay job for now. She has previously seen neurology in the past although she has not seen them in more than 1 year. She will return if the migraines worsen or are not well controlled.

## 2014-05-17 NOTE — Assessment & Plan Note (Signed)
Taking diltiazem and imdur and losartan. BP well controlled and checking labs.

## 2014-05-17 NOTE — Assessment & Plan Note (Signed)
Checking lipid panel, currently taking lipitor daily without adverse event.

## 2014-05-18 DIAGNOSIS — K210 Gastro-esophageal reflux disease with esophagitis: Secondary | ICD-10-CM | POA: Diagnosis not present

## 2014-05-20 DIAGNOSIS — K210 Gastro-esophageal reflux disease with esophagitis: Secondary | ICD-10-CM | POA: Diagnosis not present

## 2014-05-21 ENCOUNTER — Other Ambulatory Visit: Payer: Self-pay | Admitting: Internal Medicine

## 2014-05-21 DIAGNOSIS — K210 Gastro-esophageal reflux disease with esophagitis: Secondary | ICD-10-CM | POA: Diagnosis not present

## 2014-05-21 MED ORDER — ESZOPICLONE 1 MG PO TABS: 1.0000 mg | ORAL_TABLET | Freq: Every evening | ORAL | Status: DC | PRN

## 2014-05-22 ENCOUNTER — Encounter: Payer: Self-pay | Admitting: Internal Medicine

## 2014-05-22 ENCOUNTER — Telehealth: Payer: Self-pay | Admitting: Internal Medicine

## 2014-05-22 ENCOUNTER — Telehealth: Payer: Self-pay | Admitting: *Deleted

## 2014-05-22 DIAGNOSIS — N898 Other specified noninflammatory disorders of vagina: Secondary | ICD-10-CM

## 2014-05-22 NOTE — Telephone Encounter (Signed)
Pt states she missed our call, informing her the doctor would be out 07/11/2014 and could she be scheduled for 07/18/2014 at the same appt time.  Pt states that would be fine, please call and confirm.

## 2014-05-22 NOTE — Telephone Encounter (Signed)
Josie from Dr. Marcheta Grammes office called stating the patient has an appt, but b/c she has Medicare they need a referral from her PCP. She is going to see him for a clear vaginal discharge. Please enter referral, if appropriate. Thank you!

## 2014-05-23 DIAGNOSIS — K210 Gastro-esophageal reflux disease with esophagitis: Secondary | ICD-10-CM | POA: Diagnosis not present

## 2014-05-23 NOTE — Telephone Encounter (Signed)
Placed.

## 2014-05-23 NOTE — Telephone Encounter (Signed)
Patient has been rescheduled to 06.30.2016.

## 2014-05-24 DIAGNOSIS — K210 Gastro-esophageal reflux disease with esophagitis: Secondary | ICD-10-CM | POA: Diagnosis not present

## 2014-05-24 LAB — CUP PACEART REMOTE DEVICE CHECK: MDC IDC SESS DTM: 20160408040500

## 2014-05-27 DIAGNOSIS — K210 Gastro-esophageal reflux disease with esophagitis: Secondary | ICD-10-CM | POA: Diagnosis not present

## 2014-05-28 DIAGNOSIS — K210 Gastro-esophageal reflux disease with esophagitis: Secondary | ICD-10-CM | POA: Diagnosis not present

## 2014-05-28 NOTE — Telephone Encounter (Signed)
Referral faxed to Dr. Marcheta Grammes office.

## 2014-05-30 DIAGNOSIS — K210 Gastro-esophageal reflux disease with esophagitis: Secondary | ICD-10-CM | POA: Diagnosis not present

## 2014-05-31 DIAGNOSIS — K210 Gastro-esophageal reflux disease with esophagitis: Secondary | ICD-10-CM | POA: Diagnosis not present

## 2014-06-02 DIAGNOSIS — J45998 Other asthma: Secondary | ICD-10-CM | POA: Diagnosis not present

## 2014-06-03 DIAGNOSIS — K210 Gastro-esophageal reflux disease with esophagitis: Secondary | ICD-10-CM | POA: Diagnosis not present

## 2014-06-04 DIAGNOSIS — K210 Gastro-esophageal reflux disease with esophagitis: Secondary | ICD-10-CM | POA: Diagnosis not present

## 2014-06-06 DIAGNOSIS — K210 Gastro-esophageal reflux disease with esophagitis: Secondary | ICD-10-CM | POA: Diagnosis not present

## 2014-06-07 ENCOUNTER — Encounter: Payer: Self-pay | Admitting: Internal Medicine

## 2014-06-07 ENCOUNTER — Ambulatory Visit (INDEPENDENT_AMBULATORY_CARE_PROVIDER_SITE_OTHER): Payer: Medicare Other | Admitting: *Deleted

## 2014-06-07 DIAGNOSIS — M7542 Impingement syndrome of left shoulder: Secondary | ICD-10-CM | POA: Diagnosis not present

## 2014-06-07 DIAGNOSIS — R55 Syncope and collapse: Secondary | ICD-10-CM

## 2014-06-07 DIAGNOSIS — K210 Gastro-esophageal reflux disease with esophagitis: Secondary | ICD-10-CM | POA: Diagnosis not present

## 2014-06-07 DIAGNOSIS — M7541 Impingement syndrome of right shoulder: Secondary | ICD-10-CM | POA: Diagnosis not present

## 2014-06-07 DIAGNOSIS — M1712 Unilateral primary osteoarthritis, left knee: Secondary | ICD-10-CM | POA: Diagnosis not present

## 2014-06-09 DIAGNOSIS — K210 Gastro-esophageal reflux disease with esophagitis: Secondary | ICD-10-CM | POA: Diagnosis not present

## 2014-06-10 DIAGNOSIS — K210 Gastro-esophageal reflux disease with esophagitis: Secondary | ICD-10-CM | POA: Diagnosis not present

## 2014-06-10 DIAGNOSIS — E1165 Type 2 diabetes mellitus with hyperglycemia: Secondary | ICD-10-CM | POA: Diagnosis not present

## 2014-06-11 DIAGNOSIS — K210 Gastro-esophageal reflux disease with esophagitis: Secondary | ICD-10-CM | POA: Diagnosis not present

## 2014-06-11 NOTE — Progress Notes (Signed)
Loop recorder 

## 2014-06-12 DIAGNOSIS — K210 Gastro-esophageal reflux disease with esophagitis: Secondary | ICD-10-CM | POA: Diagnosis not present

## 2014-06-13 ENCOUNTER — Encounter: Payer: Self-pay | Admitting: Internal Medicine

## 2014-06-13 DIAGNOSIS — Z6836 Body mass index (BMI) 36.0-36.9, adult: Secondary | ICD-10-CM | POA: Diagnosis not present

## 2014-06-13 DIAGNOSIS — E119 Type 2 diabetes mellitus without complications: Secondary | ICD-10-CM | POA: Diagnosis not present

## 2014-06-13 DIAGNOSIS — K210 Gastro-esophageal reflux disease with esophagitis: Secondary | ICD-10-CM | POA: Diagnosis not present

## 2014-06-14 DIAGNOSIS — M7541 Impingement syndrome of right shoulder: Secondary | ICD-10-CM | POA: Diagnosis not present

## 2014-06-14 DIAGNOSIS — M25511 Pain in right shoulder: Secondary | ICD-10-CM | POA: Diagnosis not present

## 2014-06-20 DIAGNOSIS — K210 Gastro-esophageal reflux disease with esophagitis: Secondary | ICD-10-CM | POA: Diagnosis not present

## 2014-06-20 LAB — CUP PACEART REMOTE DEVICE CHECK: Date Time Interrogation Session: 20160520040500

## 2014-06-21 DIAGNOSIS — K210 Gastro-esophageal reflux disease with esophagitis: Secondary | ICD-10-CM | POA: Diagnosis not present

## 2014-06-24 DIAGNOSIS — K210 Gastro-esophageal reflux disease with esophagitis: Secondary | ICD-10-CM | POA: Diagnosis not present

## 2014-06-25 DIAGNOSIS — K210 Gastro-esophageal reflux disease with esophagitis: Secondary | ICD-10-CM | POA: Diagnosis not present

## 2014-06-26 ENCOUNTER — Other Ambulatory Visit: Payer: Self-pay | Admitting: Internal Medicine

## 2014-06-27 ENCOUNTER — Telehealth: Payer: Self-pay | Admitting: Internal Medicine

## 2014-06-27 DIAGNOSIS — K210 Gastro-esophageal reflux disease with esophagitis: Secondary | ICD-10-CM | POA: Diagnosis not present

## 2014-06-27 NOTE — Telephone Encounter (Signed)
Patient was prescribed Lunesta and insurance will not cover that. Dora should have faxed requests over for a couple different alternatives. Patient did not know the names of them.

## 2014-06-27 NOTE — Telephone Encounter (Signed)
Have not received any paperwork with any other drug alternatives.

## 2014-06-28 ENCOUNTER — Encounter: Payer: Self-pay | Admitting: Gastroenterology

## 2014-06-28 ENCOUNTER — Encounter (INDEPENDENT_AMBULATORY_CARE_PROVIDER_SITE_OTHER): Payer: Self-pay

## 2014-06-28 ENCOUNTER — Ambulatory Visit (INDEPENDENT_AMBULATORY_CARE_PROVIDER_SITE_OTHER): Payer: Medicare Other | Admitting: Gastroenterology

## 2014-06-28 VITALS — BP 134/70 | HR 88 | Ht 64.0 in | Wt 220.2 lb

## 2014-06-28 DIAGNOSIS — R1033 Periumbilical pain: Secondary | ICD-10-CM | POA: Diagnosis not present

## 2014-06-28 DIAGNOSIS — Z86010 Personal history of colonic polyps: Secondary | ICD-10-CM

## 2014-06-28 DIAGNOSIS — K210 Gastro-esophageal reflux disease with esophagitis: Secondary | ICD-10-CM | POA: Diagnosis not present

## 2014-06-28 MED ORDER — HYOSCYAMINE SULFATE 0.125 MG SL SUBL: 0.2500 mg | SUBLINGUAL_TABLET | SUBLINGUAL | Status: DC | PRN

## 2014-06-28 MED ORDER — NA SULFATE-K SULFATE-MG SULF 17.5-3.13-1.6 GM/177ML PO SOLN: 1.0000 | Freq: Once | ORAL | Status: DC

## 2014-06-28 NOTE — Patient Instructions (Addendum)
You have been scheduled for a colonoscopy. Please follow written instructions given to you at your visit today.  Please pick up your prep supplies at the pharmacy within the next 1-3 days. If you use inhalers (even only as needed), please bring them with you on the day of your procedure. Your physician has requested that you go to www.startemmi.com and enter the access code given to you at your visit today. This web site gives a general overview about your procedure. However, you should still follow specific instructions given to you by our office regarding your preparation for the procedure.  You will be contacted by our office prior to your procedure for directions on holding your Plavix.  If you do not hear from our office 1 week prior to your scheduled procedure, please call [phone] to discuss.   We have sent Hyomax to your pharmacy   [facility] will be contacting you in a few days  Diabetic instructions given for procedure

## 2014-06-28 NOTE — Assessment & Plan Note (Signed)
Patient will be scheduled for colonoscopy.  Plavix will be held if approved by her PCP.  The risk of holding anticoagulation therapy or antiplatelet medications was discussed including the increased risk for thromboembolic disease that may include DVT, pulmonary emboli and stroke. The patient understands this risk and is willing to proceed with temporally holding the medication provided that this is approved by her PCP or cardiologist.

## 2014-06-28 NOTE — Assessment & Plan Note (Signed)
She is very nonspecific abdominal discomfort that may be related to gastroparesis.  Doubt underlying significant GI pathology.  Recommendations #1 continue Reglan #2 hyomax sublingual when necessary

## 2014-06-28 NOTE — Progress Notes (Signed)
      History of Present Illness:  [name] is complaining of postprandial pressure in her upper abdomen followed by a "hungry feeling" in her lower abdomen.  Is mild distention.  She has well-established gastroparesis and is taking Reglan.  There's been no change of bowel habits.  She denies nausea or pyrosis.  Adenomatous polyps were removed in 2011.    Review of Systems: Pertinent positive and negative review of systems were noted in the above HPI section. All other review of systems were otherwise negative.    Current Medications, Allergies, Past Medical History, Past Surgical History, Family History and Social History were reviewed in [facility] record  Vital signs were reviewed in today's medical record. Physical Exam: General: Obese female in no acute distress Skin: anicteric Head: Normocephalic and atraumatic Eyes:  sclerae anicteric, EOMI Ears: Normal auditory acuity Mouth: No deformity or lesions Lymph Nodes: no lymphadenopathy Lungs: Clear throughout to auscultation Heart: Regular rate and rhythm; no murmurs, rubs or brui: Gastroinestinal:  Soft, non tender and non distended. No masses, hepatosplenomegaly or hernias noted. Normal Bowel sounds.  There is no succussion splash Rectal:deferred Musculoskeletal: Symmetrical with no gross deformities  Pulses:  Normal pulses noted Extremities: No clubbing, cyanosis, edema or deformities noted Neurological: Alert oriented x 4, grossly nonfocal Psychological:  Alert and cooperative. Normal mood and affect  See Assessment and Plan under Problem List

## 2014-07-01 DIAGNOSIS — K210 Gastro-esophageal reflux disease with esophagitis: Secondary | ICD-10-CM | POA: Diagnosis not present

## 2014-07-02 ENCOUNTER — Other Ambulatory Visit: Payer: Self-pay | Admitting: Gastroenterology

## 2014-07-02 DIAGNOSIS — K210 Gastro-esophageal reflux disease with esophagitis: Secondary | ICD-10-CM | POA: Diagnosis not present

## 2014-07-03 DIAGNOSIS — J45998 Other asthma: Secondary | ICD-10-CM | POA: Diagnosis not present

## 2014-07-04 DIAGNOSIS — K210 Gastro-esophageal reflux disease with esophagitis: Secondary | ICD-10-CM | POA: Diagnosis not present

## 2014-07-05 ENCOUNTER — Encounter: Payer: Self-pay | Admitting: Internal Medicine

## 2014-07-05 DIAGNOSIS — K210 Gastro-esophageal reflux disease with esophagitis: Secondary | ICD-10-CM | POA: Diagnosis not present

## 2014-07-08 ENCOUNTER — Encounter: Payer: Self-pay | Admitting: Internal Medicine

## 2014-07-08 ENCOUNTER — Ambulatory Visit (INDEPENDENT_AMBULATORY_CARE_PROVIDER_SITE_OTHER): Payer: Medicare Other | Admitting: *Deleted

## 2014-07-08 DIAGNOSIS — R55 Syncope and collapse: Secondary | ICD-10-CM

## 2014-07-08 MED ORDER — TRAZODONE HCL 50 MG PO TABS: 50.0000 mg | ORAL_TABLET | Freq: Every evening | ORAL | Status: DC | PRN

## 2014-07-08 NOTE — Telephone Encounter (Signed)
Spoke to pt to sched cpe and awv.  She mentioned the alternative and was able to contact the pharmacy.   The alternative to lunesta that pt ins prefers are: trazidone rozerem  belsomra  Please advise and we will get this to the pharmacy for the pt.

## 2014-07-08 NOTE — Telephone Encounter (Signed)
Trazodone sent in. Take 30 minutes prior to bedtime and can take 2 weeks to start working well and she should try for at least that long.

## 2014-07-09 ENCOUNTER — Encounter: Payer: Self-pay | Admitting: Internal Medicine

## 2014-07-09 DIAGNOSIS — M5416 Radiculopathy, lumbar region: Secondary | ICD-10-CM | POA: Diagnosis not present

## 2014-07-09 DIAGNOSIS — Z6836 Body mass index (BMI) 36.0-36.9, adult: Secondary | ICD-10-CM | POA: Diagnosis not present

## 2014-07-09 DIAGNOSIS — M545 Low back pain: Secondary | ICD-10-CM | POA: Diagnosis not present

## 2014-07-09 NOTE — Progress Notes (Signed)
Loop recorder 

## 2014-07-09 NOTE — Telephone Encounter (Signed)
Pt informed

## 2014-07-10 ENCOUNTER — Encounter: Payer: Self-pay | Admitting: Internal Medicine

## 2014-07-10 ENCOUNTER — Telehealth: Payer: Self-pay | Admitting: Internal Medicine

## 2014-07-10 NOTE — Telephone Encounter (Signed)
States patient no showed for appointment today with Dr. Ruthann Cancer.  Also stated that patient has medicare and Dr. Ruthann Cancer is not accepting medicare patients at this time.

## 2014-07-11 ENCOUNTER — Ambulatory Visit: Payer: Medicare Other

## 2014-07-12 ENCOUNTER — Encounter: Payer: Self-pay | Admitting: Internal Medicine

## 2014-07-16 LAB — CUP PACEART REMOTE DEVICE CHECK: MDC IDC SESS DTM: 20160623040500

## 2014-07-18 ENCOUNTER — Ambulatory Visit (INDEPENDENT_AMBULATORY_CARE_PROVIDER_SITE_OTHER): Payer: Medicare Other | Admitting: Podiatry

## 2014-07-18 DIAGNOSIS — M79676 Pain in unspecified toe(s): Secondary | ICD-10-CM | POA: Diagnosis not present

## 2014-07-18 DIAGNOSIS — E119 Type 2 diabetes mellitus without complications: Secondary | ICD-10-CM | POA: Diagnosis not present

## 2014-07-18 DIAGNOSIS — B351 Tinea unguium: Secondary | ICD-10-CM

## 2014-07-18 NOTE — Progress Notes (Signed)
Patient ID: [name], female   DOB: 10-04-1945, 69 y.o.   MRN: [id number] Complaint:  Visit Type: Patient returns to my office for continued preventative foot care services. Complaint: Patient states" my nails have grown long and thick and become painful to walk and wear shoes" Patient has been diagnosed with DM with no complications. He presents for preventative foot care services. No changes to ROS  Podiatric Exam: Vascular: dorsalis pedis and posterior tibial pulses are palpable bilateral. Capillary return is immediate. Temperature gradient is WNL. Skin turgor WNL  Sensorium: Normal Semmes Weinstein monofilament test. Normal tactile sensation bilaterally. Nail Exam: Pt has thick disfigured discolored nails with subungual debris noted bilateral entire nail hallux through fifth toenails Ulcer Exam: There is no evidence of ulcer or pre-ulcerative changes or infection. Orthopedic Exam: Muscle tone and strength are WNL. No limitations in general ROM. No crepitus or effusions noted. Foot type and digits show no abnormalities. Bony prominences are unremarkable. Skin: No Porokeratosis. No infection or ulcers  Diagnosis:  Tinea unguium, Pain in right toe, pain in left toes  Treatment & Plan Procedures and Treatment: Consent by patient was obtained for treatment procedures. The patient understood the discussion of treatment and procedures well. All questions were answered thoroughly reviewed. Debridement of mycotic and hypertrophic toenails, 1 through 5 bilateral and clearing of subungual debris. No ulceration, no infection noted.  Return Visit-Office Procedure: Patient instructed to return to the office for a follow up visit 3 months for continued evaluation and treatment..  She has previous foot surgery.

## 2014-07-19 DIAGNOSIS — M5416 Radiculopathy, lumbar region: Secondary | ICD-10-CM | POA: Diagnosis not present

## 2014-07-19 DIAGNOSIS — M5126 Other intervertebral disc displacement, lumbar region: Secondary | ICD-10-CM | POA: Diagnosis not present

## 2014-07-30 ENCOUNTER — Other Ambulatory Visit: Payer: Self-pay | Admitting: Internal Medicine

## 2014-07-30 ENCOUNTER — Encounter: Payer: Self-pay | Admitting: Internal Medicine

## 2014-07-31 ENCOUNTER — Encounter: Payer: Self-pay | Admitting: Internal Medicine

## 2014-08-02 DIAGNOSIS — J45998 Other asthma: Secondary | ICD-10-CM | POA: Diagnosis not present

## 2014-08-06 DIAGNOSIS — H16013 Central corneal ulcer, bilateral: Secondary | ICD-10-CM | POA: Diagnosis not present

## 2014-08-06 DIAGNOSIS — H2513 Age-related nuclear cataract, bilateral: Secondary | ICD-10-CM | POA: Diagnosis not present

## 2014-08-06 DIAGNOSIS — H40013 Open angle with borderline findings, low risk, bilateral: Secondary | ICD-10-CM | POA: Diagnosis not present

## 2014-08-07 ENCOUNTER — Ambulatory Visit (INDEPENDENT_AMBULATORY_CARE_PROVIDER_SITE_OTHER): Payer: Medicare Other | Admitting: *Deleted

## 2014-08-07 DIAGNOSIS — R55 Syncope and collapse: Secondary | ICD-10-CM | POA: Diagnosis not present

## 2014-08-08 ENCOUNTER — Telehealth: Payer: Self-pay

## 2014-08-08 NOTE — Telephone Encounter (Signed)
Call to [name] to intro AWV / Noted  CPE note stated the patient was scheduled for AWV post visit with Dr. Doug Sou. Call to the patient and she was not sure as someone would be bringing her to the apt. Would prefer to stay after 11am apt and will call if this changes;  Also, stated she would like to take shingles and TD but would not take flu or pneumonia. Stated to call insurance company to confirm oop cost as applicable because there can be oop expenses The patient states she has medicaid as secondary. Will check on coverage

## 2014-08-08 NOTE — Progress Notes (Signed)
Loop recorder 

## 2014-08-13 ENCOUNTER — Encounter: Payer: Self-pay | Admitting: Internal Medicine

## 2014-08-13 ENCOUNTER — Ambulatory Visit (INDEPENDENT_AMBULATORY_CARE_PROVIDER_SITE_OTHER): Payer: Medicare Other | Admitting: Internal Medicine

## 2014-08-13 VITALS — BP 118/72 | HR 96 | Temp 99.0°F | Resp 14 | Ht 64.0 in | Wt 223.0 lb

## 2014-08-13 DIAGNOSIS — Z0000 Encounter for general adult medical examination without abnormal findings: Secondary | ICD-10-CM | POA: Insufficient documentation

## 2014-08-13 DIAGNOSIS — E1149 Type 2 diabetes mellitus with other diabetic neurological complication: Secondary | ICD-10-CM | POA: Diagnosis not present

## 2014-08-13 DIAGNOSIS — I10 Essential (primary) hypertension: Secondary | ICD-10-CM | POA: Diagnosis not present

## 2014-08-13 DIAGNOSIS — G4733 Obstructive sleep apnea (adult) (pediatric): Secondary | ICD-10-CM

## 2014-08-13 MED ORDER — DIAZEPAM 5 MG PO TABS: 5.0000 mg | ORAL_TABLET | Freq: Two times a day (BID) | ORAL | Status: DC

## 2014-08-13 MED ORDER — GABAPENTIN 300 MG PO CAPS: 300.0000 mg | ORAL_CAPSULE | Freq: Two times a day (BID) | ORAL | Status: DC | PRN

## 2014-08-13 MED ORDER — SUVOREXANT 10 MG PO TABS: 10.0000 mg | ORAL_TABLET | Freq: Every evening | ORAL | Status: DC | PRN

## 2014-08-13 NOTE — Assessment & Plan Note (Signed)
Will try belsomra for sleep as this is an approved medicine from her insurance. 10 mg rx given today and she will try for at least 2 weeks. Did not do well on trazodone and has not done well with ambien in the past. Talked to her about not napping in the evening to avoid early awakening at night time.

## 2014-08-13 NOTE — Assessment & Plan Note (Signed)
BP still well controlled on losartan, diltiazem, imdur. Recent BMP reviewed and no indication for change or CKD. Continue current regimen.

## 2014-08-13 NOTE — Assessment & Plan Note (Signed)
Still awaiting previous records for immunization history. If not received by next visit will restart the pneumonia series. Colonoscopy due this year and has already seen GI to get scheduled. 10 year screening recommendations given to her at visit. Talked to her about home safety. Medication reconciliation done today and several medications adjusted.

## 2014-08-13 NOTE — Patient Instructions (Addendum)
We will have you stop taking the glimepiride because your sugars are doing so well.  We will also send in a higher dose of the gabapentin that you can take twice a day for the pain in the feet.   We have sent in the valium and also a new sleep medicine called belsomra. It is a medicine that helps with sleep and can take 2 weeks before it really gets in your system so give it a chance.   Health Maintenance Adopting a healthy lifestyle and getting preventive care can go a long way to promote health and wellness. Talk with your health care provider about what schedule of regular examinations is right for you. This is a good chance for you to check in with your provider about disease prevention and staying healthy. In between checkups, there are plenty of things you can do on your own. Experts have done a lot of research about which lifestyle changes and preventive measures are most likely to keep you healthy. Ask your health care provider for more information. WEIGHT AND DIET  Eat a healthy diet  Be sure to include plenty of vegetables, fruits, low-fat dairy products, and lean protein.  Do not eat a lot of foods high in solid fats, added sugars, or salt.  Get regular exercise. This is one of the most important things you can do for your health.  Most adults should exercise for at least 150 minutes each week. The exercise should increase your heart rate and make you sweat (moderate-intensity exercise).  Most adults should also do strengthening exercises at least twice a week. This is in addition to the moderate-intensity exercise.  Maintain a healthy weight  Body mass index (BMI) is a measurement that can be used to identify possible weight problems. It estimates body fat based on height and weight. Your health care provider can help determine your BMI and help you achieve or maintain a healthy weight.  For females 22 years of age and older:   A BMI below 18.5 is considered underweight.  A  BMI of 18.5 to 24.9 is normal.  A BMI of 25 to 29.9 is considered overweight.  A BMI of 30 and above is considered obese.  Watch levels of cholesterol and blood lipids  You should start having your blood tested for lipids and cholesterol at 67 years of age, then have this test every 5 years.  You may need to have your cholesterol levels checked more often if:  Your lipid or cholesterol levels are high.  You are older than 67 years of age.  You are at high risk for heart disease.  CANCER SCREENING   Lung Cancer  Lung cancer screening is recommended for adults 49-55 years old who are at high risk for lung cancer because of a history of smoking.  A yearly low-dose CT scan of the lungs is recommended for people who:  Currently smoke.  Have quit within the past 15 years.  Have at least a 30-pack-year history of smoking. A pack year is smoking an average of one pack of cigarettes a day for 1 year.  Yearly screening should continue until it has been 15 years since you quit.  Yearly screening should stop if you develop a health problem that would prevent you from having lung cancer treatment.  Breast Cancer  Practice breast self-awareness. This means understanding how your breasts normally appear and feel.  It also means doing regular breast self-exams. Let your health care provider know  about any changes, no matter how small.  If you are in your 20s or 30s, you should have a clinical breast exam (CBE) by a health care provider every 1-3 years as part of a regular health exam.  If you are 22 or older, have a CBE every year. Also consider having a breast X-ray (mammogram) every year.  If you have a family history of breast cancer, talk to your health care provider about genetic screening.  If you are at high risk for breast cancer, talk to your health care provider about having an MRI and a mammogram every year.  Breast cancer gene (BRCA) assessment is recommended for women  who have family members with BRCA-related cancers. BRCA-related cancers include:  Breast.  Ovarian.  Tubal.  Peritoneal cancers.  Results of the assessment will determine the need for genetic counseling and BRCA1 and BRCA2 testing. Cervical Cancer Routine pelvic examinations to screen for cervical cancer are no longer recommended for nonpregnant women who are considered low risk for cancer of the pelvic organs (ovaries, uterus, and vagina) and who do not have symptoms. A pelvic examination may be necessary if you have symptoms including those associated with pelvic infections. Ask your health care provider if a screening pelvic exam is right for you.   The Pap test is the screening test for cervical cancer for women who are considered at risk.  If you had a hysterectomy for a problem that was not cancer or a condition that could lead to cancer, then you no longer need Pap tests.  If you are older than 65 years, and you have had normal Pap tests for the past 10 years, you no longer need to have Pap tests.  If you have had past treatment for cervical cancer or a condition that could lead to cancer, you need Pap tests and screening for cancer for at least 20 years after your treatment.  If you no longer get a Pap test, assess your risk factors if they change (such as having a new sexual partner). This can affect whether you should start being screened again.  Some women have medical problems that increase their chance of getting cervical cancer. If this is the case for you, your health care provider may recommend more frequent screening and Pap tests.  The human papillomavirus (HPV) test is another test that may be used for cervical cancer screening. The HPV test looks for the virus that can cause cell changes in the cervix. The cells collected during the Pap test can be tested for HPV.  The HPV test can be used to screen women 41 years of age and older. Getting tested for HPV can extend the  interval between normal Pap tests from three to five years.  An HPV test also should be used to screen women of any age who have unclear Pap test results.  After 67 years of age, women should have HPV testing as often as Pap tests.  Colorectal Cancer  This type of cancer can be detected and often prevented.  Routine colorectal cancer screening usually begins at 67 years of age and continues through 67 years of age.  Your health care provider may recommend screening at an earlier age if you have risk factors for colon cancer.  Your health care provider may also recommend using home test kits to check for hidden blood in the stool.  A small camera at the end of a tube can be used to examine your colon directly (sigmoidoscopy  or colonoscopy). This is done to check for the earliest forms of colorectal cancer.  Routine screening usually begins at age 25.  Direct examination of the colon should be repeated every 5-10 years through 67 years of age. However, you may need to be screened more often if early forms of precancerous polyps or small growths are found. Skin Cancer  Check your skin from head to toe regularly.  Tell your health care provider about any new moles or changes in moles, especially if there is a change in a mole's shape or color.  Also tell your health care provider if you have a mole that is larger than the size of a pencil eraser.  Always use sunscreen. Apply sunscreen liberally and repeatedly throughout the day.  Protect yourself by wearing long sleeves, pants, a wide-brimmed hat, and sunglasses whenever you are outside. HEART DISEASE, DIABETES, AND HIGH BLOOD PRESSURE   Have your blood pressure checked at least every 1-2 years. High blood pressure causes heart disease and increases the risk of stroke.  If you are between 40 years and 49 years old, ask your health care provider if you should take aspirin to prevent strokes.  Have regular diabetes screenings. This  involves taking a blood sample to check your fasting blood sugar level.  If you are at a normal weight and have a low risk for diabetes, have this test once every three years after 67 years of age.  If you are overweight and have a high risk for diabetes, consider being tested at a younger age or more often. PREVENTING INFECTION  Hepatitis B  If you have a higher risk for hepatitis B, you should be screened for this virus. You are considered at high risk for hepatitis B if:  You were born in a country where hepatitis B is common. Ask your health care provider which countries are considered high risk.  Your parents were born in a high-risk country, and you have not been immunized against hepatitis B (hepatitis B vaccine).  You have HIV or AIDS.  You use needles to inject street drugs.  You live with someone who has hepatitis B.  You have had sex with someone who has hepatitis B.  You get hemodialysis treatment.  You take certain medicines for conditions, including cancer, organ transplantation, and autoimmune conditions. Hepatitis C  Blood testing is recommended for:  Everyone born from [birth year] through 1963.  Anyone with known risk factors for hepatitis C. Sexually transmitted infections (STIs)  You should be screened for sexually transmitted infections (STIs) including gonorrhea and chlamydia if:  You are sexually active and are younger than 67 years of age.  You are older than 67 years of age and your health care provider tells you that you are at risk for this type of infection.  Your sexual activity has changed since you were last screened and you are at an increased risk for chlamydia or gonorrhea. Ask your health care provider if you are at risk.  If you do not have HIV, but are at risk, it may be recommended that you take a prescription medicine daily to prevent HIV infection. This is called pre-exposure prophylaxis (PrEP). You are considered at risk if:  You are  sexually active and do not regularly use condoms or know the HIV status of your partner(s).  You take drugs by injection.  You are sexually active with a partner who has HIV. Talk with your health care provider about whether you are at high  risk of being infected with HIV. If you choose to begin PrEP, you should first be tested for HIV. You should then be tested every 3 months for as long as you are taking PrEP.  PREGNANCY   If you are premenopausal and you may become pregnant, ask your health care provider about preconception counseling.  If you may become pregnant, take 400 to 800 micrograms (mcg) of folic acid every day.  If you want to prevent pregnancy, talk to your health care provider about birth control (contraception). OSTEOPOROSIS AND MENOPAUSE   Osteoporosis is a disease in which the bones lose minerals and strength with aging. This can result in serious bone fractures. Your risk for osteoporosis can be identified using a bone density scan.  If you are 76 years of age or older, or if you are at risk for osteoporosis and fractures, ask your health care provider if you should be screened.  Ask your health care provider whether you should take a calcium or vitamin D supplement to lower your risk for osteoporosis.  Menopause may have certain physical symptoms and risks.  Hormone replacement therapy may reduce some of these symptoms and risks. Talk to your health care provider about whether hormone replacement therapy is right for you.  HOME CARE INSTRUCTIONS   Schedule regular health, dental, and eye exams.  Stay current with your immunizations.   Do not use any tobacco products including cigarettes, chewing tobacco, or electronic cigarettes.  If you are pregnant, do not drink alcohol.  If you are breastfeeding, limit how much and how often you drink alcohol.  Limit alcohol intake to no more than 1 drink per day for nonpregnant women. One drink equals 12 ounces of beer, 5  ounces of wine, or 1 ounces of hard liquor.  Do not use street drugs.  Do not share needles.  Ask your health care provider for help if you need support or information about quitting drugs.  Tell your health care provider if you often feel depressed.  Tell your health care provider if you have ever been abused or do not feel safe at home. Document Released: 07/20/2010 Document Revised: 05/21/2013 Document Reviewed: 12/06/2012 [facility] Patient Information 2015 [place], [place]. This information is not intended to replace advice given to you by your health care provider. Make sure you discuss any questions you have with your health care provider.

## 2014-08-13 NOTE — Progress Notes (Signed)
   Subjective:    Patient ID: [name], female    DOB: 24-Aug-1945, 69 y.o.   MRN: [id number]  HPI Here for medicare wellness, no new complaints. Please see A/P for status and treatment of chronic medical problems.   Diet: DM if diabetic Physical activity: sedentary Depression/mood screen: negative Hearing: some loss, right greater than left Visual acuity: grossly normal with glasses, performs annual eye exam  ADLs: capable Fall risk: low Home safety: good Cognitive evaluation: intact to orientation, naming, recall and repetition EOL planning: adv directives discussed  I have personally reviewed and have noted 1. The patient's medical and social history - reviewed today no changes 2. Their use of alcohol, tobacco or illicit drugs 3. Their current medications and supplements 4. The patient's functional ability including ADL's, fall risks, home safety risks and hearing or visual impairment. 5. Diet and physical activities 6. Evidence for depression or mood disorders 7. Care team reviewed and updated (available in snapshot)  Review of Systems  Constitutional: Positive for activity change. Negative for fever, appetite change and fatigue.  Eyes: Negative.   Respiratory: Negative for cough, chest tightness, shortness of breath and wheezing.   Cardiovascular: Negative for chest pain, palpitations and leg swelling.  Gastrointestinal: Negative.   Endocrine: Negative.   Musculoskeletal: Positive for back pain and arthralgias.  Skin: Negative.   Neurological: Positive for numbness. Negative for dizziness, weakness, light-headedness and headaches.  Psychiatric/Behavioral: Positive for sleep disturbance. Negative for decreased concentration and agitation. The patient is not nervous/anxious.       Objective:   Physical Exam  Constitutional: She is oriented to person, place, and time. She appears well-developed and well-nourished.  HENT:  Head: Normocephalic and atraumatic.  Eyes:  EOM are normal.  Neck: Normal range of motion.  Cardiovascular: Normal rate and regular rhythm.   Pulmonary/Chest: Effort normal. No respiratory distress. She has no wheezes. She has no rales.  Abdominal: Soft. She exhibits no distension. There is no tenderness.  Musculoskeletal: She exhibits no edema.  Neurological: She is alert and oriented to person, place, and time.  Slow to stand, steady  Skin: Skin is warm and dry.  Psychiatric: She has a normal mood and affect.   Filed Vitals:   08/13/14 1110  BP: 118/72  Pulse: 96  Temp: 99 F (37.2 C)  TempSrc: Oral  Resp: 14  Height: 5\' 4"  (1.626 m)  Weight: 223 lb (101.152 kg)  SpO2: 97%      Assessment & Plan:

## 2014-08-13 NOTE — Assessment & Plan Note (Addendum)
Last HgA1c at home was 6.3 and last one here was 6.5 and will stop glimepiride. Continue with saxagliptin/metformin. Complicated with neurological changes including peripheral neuropathy and gastroparesis which are stable. Foot exam done today and eye exam done last week without retinopathy. On ARB and statin as well. Neuropathy not well controlled on current regimen and now will change to 300 mg BID prn.

## 2014-08-13 NOTE — Progress Notes (Signed)
Pre visit review using our clinic review tool, if applicable. No additional management support is needed unless otherwise documented below in the visit note. 

## 2014-08-14 ENCOUNTER — Telehealth: Payer: Self-pay | Admitting: Internal Medicine

## 2014-08-14 NOTE — Telephone Encounter (Signed)
Patient states that pharmacy never received belsomra or valium.  [facility].

## 2014-08-15 MED ORDER — SUVOREXANT 10 MG PO TABS: 10.0000 mg | ORAL_TABLET | Freq: Every evening | ORAL | Status: DC | PRN

## 2014-08-15 MED ORDER — DIAZEPAM 5 MG PO TABS: 5.0000 mg | ORAL_TABLET | Freq: Two times a day (BID) | ORAL | Status: DC

## 2014-08-15 NOTE — Telephone Encounter (Signed)
Patient went through her avs paperwork and stated she did not see it in her paperwork from office visit on 7/26---can you check to make sure rx has not been filled and i will call it in to pharm and also let pharm know not to fill rx dated 7/26 should it appear later---please advise, thanks

## 2014-08-15 NOTE — Telephone Encounter (Signed)
Faxed to [facility]

## 2014-08-15 NOTE — Telephone Encounter (Signed)
Printed and signed, please fax.  

## 2014-08-21 ENCOUNTER — Encounter: Payer: Self-pay | Admitting: Internal Medicine

## 2014-08-22 ENCOUNTER — Encounter: Payer: Self-pay | Admitting: Internal Medicine

## 2014-08-29 ENCOUNTER — Encounter: Payer: Self-pay | Admitting: Internal Medicine

## 2014-08-30 LAB — CUP PACEART REMOTE DEVICE CHECK: MDC IDC SESS DTM: 20160725040500

## 2014-09-02 DIAGNOSIS — J45998 Other asthma: Secondary | ICD-10-CM | POA: Diagnosis not present

## 2014-09-06 ENCOUNTER — Ambulatory Visit (INDEPENDENT_AMBULATORY_CARE_PROVIDER_SITE_OTHER): Payer: Medicare Other | Admitting: *Deleted

## 2014-09-06 DIAGNOSIS — R55 Syncope and collapse: Secondary | ICD-10-CM

## 2014-09-06 DIAGNOSIS — E1165 Type 2 diabetes mellitus with hyperglycemia: Secondary | ICD-10-CM | POA: Diagnosis not present

## 2014-09-10 ENCOUNTER — Other Ambulatory Visit: Payer: Self-pay | Admitting: Internal Medicine

## 2014-09-10 ENCOUNTER — Ambulatory Visit (AMBULATORY_SURGERY_CENTER): Payer: Medicare Other | Admitting: Gastroenterology

## 2014-09-10 ENCOUNTER — Encounter: Payer: Self-pay | Admitting: Gastroenterology

## 2014-09-10 VITALS — BP 139/78 | HR 87 | Temp 97.2°F | Resp 0 | Ht 64.0 in | Wt 220.0 lb

## 2014-09-10 DIAGNOSIS — J45909 Unspecified asthma, uncomplicated: Secondary | ICD-10-CM | POA: Diagnosis not present

## 2014-09-10 DIAGNOSIS — K5730 Diverticulosis of large intestine without perforation or abscess without bleeding: Secondary | ICD-10-CM

## 2014-09-10 DIAGNOSIS — Z86010 Personal history of colonic polyps: Secondary | ICD-10-CM | POA: Diagnosis present

## 2014-09-10 DIAGNOSIS — E119 Type 2 diabetes mellitus without complications: Secondary | ICD-10-CM | POA: Diagnosis not present

## 2014-09-10 DIAGNOSIS — K219 Gastro-esophageal reflux disease without esophagitis: Secondary | ICD-10-CM | POA: Diagnosis not present

## 2014-09-10 DIAGNOSIS — G4733 Obstructive sleep apnea (adult) (pediatric): Secondary | ICD-10-CM | POA: Diagnosis not present

## 2014-09-10 LAB — GLUCOSE, CAPILLARY
Glucose-Capillary: 114 mg/dL — ABNORMAL HIGH (ref 65–99)
Glucose-Capillary: 136 mg/dL — ABNORMAL HIGH (ref 65–99)

## 2014-09-10 MED ORDER — SODIUM CHLORIDE 0.9 % IV SOLN: 500.0000 mL | INTRAVENOUS | Status: DC

## 2014-09-10 NOTE — Progress Notes (Signed)
Report to PACU, RN, vss, BBS= Clear.  

## 2014-09-10 NOTE — Op Note (Signed)
[facility]  [address] [place], [id number]   COLONOSCOPY PROCEDURE REPORT  PATIENT: [name], [name]  MR#: [id number] BIRTHDATE: March 26, 1945 , 77  yrs. old GENDER: female ENDOSCOPIST: Inda Castle, MD REFERRED LT:[id number] Doug Sou, M.D. PROCEDURE DATE:  09/10/2014 PROCEDURE:   Colonoscopy, surveillance First Screening Colonoscopy - Avg.  risk and is 50 yrs.  old or older - No.  Prior Negative Screening - Now for repeat screening. N/A  History of Adenoma - Now for follow-up colonoscopy & has been > or = to 3 yrs.  Yes hx of adenoma.  Has been 3 or more years since last colonoscopy.  Polyps removed today? No Recommend repeat exam, <10 yrs? No ASA CLASS:   Class II INDICATIONS:PH Colon Adenoma. 2011 MEDICATIONS: Monitored anesthesia care and Propofol 200 mg IV  DESCRIPTION OF PROCEDURE:   After the risks benefits and alternatives of the procedure were thoroughly explained, informed consent was obtained.  The digital rectal exam revealed no abnormalities of the rectum.   The LB [id number]  endoscope was introduced through the anus and advanced to the cecum, which was identified by both the appendix and ileocecal valve. No adverse events experienced.   The quality of the prep was (Suprep was used) excellent.  The instrument was then slowly withdrawn as the colon was fully examined. Estimated blood loss is zero unless otherwise noted in this procedure report.      COLON FINDINGS: There was mild diverticulosis noted in the ascending colon and transverse colon.   There was moderate diverticulosis noted in the sigmoid colon.   The examination was otherwise normal. Retroflexed views revealed no abnormalities. The time to cecum = 2.8 Withdrawal time = 6.7   The scope was withdrawn and the procedure completed. COMPLICATIONS: There were no immediate complications.  ENDOSCOPIC IMPRESSION: 1.   There was mild diverticulosis noted in the ascending colon  and transverse colon 2.   Moderate diverticulosis was noted in the sigmoid colon 3.   The examination was otherwise normal  RECOMMENDATIONS: Repeat Colonscopy in 10 years.  eSigned:  Inda Castle, MD 09/10/2014 11:33 AM   cc:

## 2014-09-10 NOTE — Patient Instructions (Signed)
YOU HAD AN ENDOSCOPIC PROCEDURE TODAY AT [facility]:   Refer to the procedure report that was given to you for any specific questions about what was found during the examination.  If the procedure report does not answer your questions, please call your gastroenterologist to clarify.  If you requested that your care partner not be given the details of your procedure findings, then the procedure report has been included in a sealed envelope for you to review at your convenience later.  YOU SHOULD EXPECT: Some feelings of bloating in the abdomen. Passage of more gas than usual.  Walking can help get rid of the air that was put into your GI tract during the procedure and reduce the bloating. If you had a lower endoscopy (such as a colonoscopy or flexible sigmoidoscopy) you may notice spotting of blood in your stool or on the toilet paper. If you underwent a bowel prep for your procedure, you may not have a normal bowel movement for a few days.  Please Note:  You might notice some irritation and congestion in your nose or some drainage.  This is from the oxygen used during your procedure.  There is no need for concern and it should clear up in a day or so.  SYMPTOMS TO REPORT IMMEDIATELY:   Following lower endoscopy (colonoscopy or flexible sigmoidoscopy):  Excessive amounts of blood in the stool  Significant tenderness or worsening of abdominal pains  Swelling of the abdomen that is new, acute  Fever of 100F or higher  For urgent or emergent issues, a gastroenterologist can be reached at any hour by calling [phone].   DIET: Your first meal following the procedure should be a small meal and then it is ok to progress to your normal diet. Heavy or fried foods are harder to digest and may make you feel nauseous or bloated.  Likewise, meals heavy in dairy and vegetables can increase bloating.  Drink plenty of fluids but you should avoid alcoholic beverages for 24  hours.  ACTIVITY:  You should plan to take it easy for the rest of today and you should NOT DRIVE or use heavy machinery until tomorrow (because of the sedation medicines used during the test).    FOLLOW UP: Our staff will call the number listed on your records the next business day following your procedure to check on you and address any questions or concerns that you may have regarding the information given to you following your procedure. If we do not reach you, we will leave a message.  However, if you are feeling well and you are not experiencing any problems, there is no need to return our call.  We will assume that you have returned to your regular daily activities without incident.  If any biopsies were taken you will be contacted by phone or by letter within the next 1-3 weeks.  Please call us at [phone] if you have not heard about the biopsies in 3 weeks.    SIGNATURES/CONFIDENTIALITY: You and/or your care partner have signed paperwork which will be entered into your electronic medical record.  These signatures attest to the fact that that the information above on your After Visit Summary has been reviewed and is understood.  Full responsibility of the confidentiality of this discharge information lies with you and/or your care-partner.  Diverticulosis handout given Repeat Colonoscopy in 10 years

## 2014-09-11 ENCOUNTER — Telehealth: Payer: Self-pay | Admitting: *Deleted

## 2014-09-11 NOTE — Telephone Encounter (Signed)
  Follow up Call-  Call back number 09/10/2014 11/23/2012  Post procedure Call Back phone  # [phone]  Permission to leave phone message Yes Yes     Patient questions:  Do you have a fever, pain , or abdominal swelling? No. Pain Score  0 *  Have you tolerated food without any problems? Yes.    Have you been able to return to your normal activities? Yes.    Do you have any questions about your discharge instructions: Diet   No. Medications  No. Follow up visit  No.  Do you have questions or concerns about your Care? No.  Actions: * If pain score is 4 or above: No action needed, pain <4.

## 2014-09-11 NOTE — Progress Notes (Signed)
Loop recorder 

## 2014-09-18 ENCOUNTER — Encounter: Payer: Self-pay | Admitting: Internal Medicine

## 2014-09-18 ENCOUNTER — Telehealth: Payer: Self-pay | Admitting: *Deleted

## 2014-09-18 NOTE — Telephone Encounter (Signed)
Called patient to make [facility] appointment to optimize [facility] programming.  Reviewed available appointments with patient and she chose 09/26/2014 at 4:30pm.  Patient voices understanding and denies additional questions or concerns at this time.

## 2014-09-19 ENCOUNTER — Encounter: Payer: Self-pay | Admitting: Internal Medicine

## 2014-09-26 ENCOUNTER — Ambulatory Visit (INDEPENDENT_AMBULATORY_CARE_PROVIDER_SITE_OTHER): Payer: Medicare Other | Admitting: *Deleted

## 2014-09-26 DIAGNOSIS — R55 Syncope and collapse: Secondary | ICD-10-CM

## 2014-09-26 LAB — CUP PACEART INCLINIC DEVICE CHECK
Date Time Interrogation Session: 20160908171421
MDC IDC SET ZONE DETECTION INTERVAL: 4500 ms
Zone Setting Detection Interval: 2000 ms
Zone Setting Detection Interval: 360 ms

## 2014-09-26 NOTE — Progress Notes (Signed)
Loop check in clinic.  Pt with 0 tachy episodes; (13) brady episodes---undersensing; (73) asystole episodes---undersensing.  Episodes were undersensing---pause duration increased from 3 sec to 4.5 sec, brady duration increased from 4 bts to 12 bts, sensitivity is currently at max setting 0.041mV. Plan to follow up via [facility] and with [facility] in 12-2014.

## 2014-09-27 DIAGNOSIS — M1712 Unilateral primary osteoarthritis, left knee: Secondary | ICD-10-CM | POA: Diagnosis not present

## 2014-10-01 ENCOUNTER — Encounter: Payer: Self-pay | Admitting: Internal Medicine

## 2014-10-02 ENCOUNTER — Ambulatory Visit: Payer: Self-pay | Admitting: Neurology

## 2014-10-03 ENCOUNTER — Other Ambulatory Visit: Payer: Self-pay | Admitting: Internal Medicine

## 2014-10-03 DIAGNOSIS — J45998 Other asthma: Secondary | ICD-10-CM | POA: Diagnosis not present

## 2014-10-04 NOTE — Telephone Encounter (Signed)
Script faxed back to [facility]

## 2014-10-04 NOTE — Telephone Encounter (Signed)
[facility] physician...Johny Chess

## 2014-10-07 ENCOUNTER — Encounter: Payer: Self-pay | Admitting: Internal Medicine

## 2014-10-07 ENCOUNTER — Ambulatory Visit (INDEPENDENT_AMBULATORY_CARE_PROVIDER_SITE_OTHER): Payer: Medicare Other | Admitting: *Deleted

## 2014-10-07 DIAGNOSIS — R55 Syncope and collapse: Secondary | ICD-10-CM | POA: Diagnosis not present

## 2014-10-07 LAB — CUP PACEART REMOTE DEVICE CHECK: Date Time Interrogation Session: 20160918213733

## 2014-10-09 ENCOUNTER — Other Ambulatory Visit: Payer: Self-pay | Admitting: Internal Medicine

## 2014-10-09 NOTE — Progress Notes (Signed)
Loop recorder 

## 2014-10-10 ENCOUNTER — Ambulatory Visit: Payer: Medicare Other | Admitting: Podiatry

## 2014-10-11 DIAGNOSIS — Z6836 Body mass index (BMI) 36.0-36.9, adult: Secondary | ICD-10-CM | POA: Diagnosis not present

## 2014-10-11 DIAGNOSIS — M545 Low back pain: Secondary | ICD-10-CM | POA: Diagnosis not present

## 2014-10-14 NOTE — Progress Notes (Addendum)
[facility] summary report received. Battery status OK. Normal device function. No new symptom or tachy episodes. 13 pause episodes and 1 brady episode--all undersensing. No new AF episodes. Will address undersensing at next office visit. Monthly summary reports and ROV with [facility] in 12/2014.

## 2014-10-15 ENCOUNTER — Ambulatory Visit (INDEPENDENT_AMBULATORY_CARE_PROVIDER_SITE_OTHER): Payer: Medicare Other | Admitting: Neurology

## 2014-10-15 ENCOUNTER — Encounter: Payer: Self-pay | Admitting: Neurology

## 2014-10-15 VITALS — BP 142/80 | HR 88 | Resp 20 | Ht 64.5 in | Wt 219.0 lb

## 2014-10-15 DIAGNOSIS — G43511 Persistent migraine aura without cerebral infarction, intractable, with status migrainosus: Secondary | ICD-10-CM | POA: Diagnosis not present

## 2014-10-15 DIAGNOSIS — R55 Syncope and collapse: Secondary | ICD-10-CM

## 2014-10-15 DIAGNOSIS — G4701 Insomnia due to medical condition: Secondary | ICD-10-CM

## 2014-10-15 DIAGNOSIS — M19012 Primary osteoarthritis, left shoulder: Secondary | ICD-10-CM | POA: Insufficient documentation

## 2014-10-15 DIAGNOSIS — G3184 Mild cognitive impairment, so stated: Secondary | ICD-10-CM | POA: Diagnosis not present

## 2014-10-15 DIAGNOSIS — M170 Bilateral primary osteoarthritis of knee: Secondary | ICD-10-CM | POA: Diagnosis not present

## 2014-10-15 DIAGNOSIS — M5136 Other intervertebral disc degeneration, lumbar region: Secondary | ICD-10-CM

## 2014-10-15 MED ORDER — GABAPENTIN 300 MG PO CAPS: ORAL_CAPSULE | ORAL | Status: DC

## 2014-10-15 MED ORDER — AMITRIPTYLINE HCL 50 MG PO TABS: ORAL_TABLET | ORAL | Status: DC

## 2014-10-15 NOTE — Patient Instructions (Signed)

## 2014-10-15 NOTE — Progress Notes (Signed)
Subjective:    Patient ID: [name] is a 69 y.o. female.  HPI  Interim history:   Towards that 3 months ago while in church she suffered a syncope spell. She describes her spells as follows she was standing at the portable for church, she started feeling lightheaded and her heart was beating fast , the warm begun to spin and she was able to tell her [name] that she probably will faint or pass out. When he guided her to the top of the 3 stair podium, she collapsed. She fainted , soon after she regained consciousness, finding herself at the bottom of the steps. She seems to have spells with LOC and a duration of  10-15 seconds, brief.  Her skin is clammy and she will be diaphoretic , nauseated and her chest tight. She never suffered a tongue bite , any kind of tonic-clonic activity or loss contour of bowel or bladder.  She further stated that her last spell in church was followed by a hyperventilation and  anxiety attack. [name] seemed to have felt that the patient had seizures, which the  patient strongly denies. The patient also had back surgery in 2013, has a long-standing history of migrainous headaches that have become more frequent, in spite of being on Topiramate, in the past her HA responded to amitryptiline.  She has a history of coronary artery disease and myocardial infarction in 98, asthma, gastroparesis, diabetes mellitus, BMI over 40, obstructive sleep apnea, she recently had an MRI of her spine, lumbar, with [name] but has not been told  the results yet.   [name] reports that  Amitryptiline was not longer covered by her [employer] and that this has caused her great distress- as her headaches were better controlled than she was able to use " elavil". Her fainting spells could be related to diabetic dysautonomia.       Last 2 visits with [name] and Dr Erling Cruz:   [name] is a 69 year old right-handed woman who presents for followup consultation of her  fainting episodes. She used to follow with Dr. Jeneen Rinks love and started having suspected fainting episodes in August 2000 age. She has an underlying medical history of obesity, asthma, hypertension, angina, diabetes, breast cancer, heart disease, hyperlipidemia, arthritis and obstructive sleep apnea. She was last seen by Dr. love in May 2013, at which time he felt that she was having dizzy episodes that were not consistent with seizures. He suggested a one-year followup. She is on Valium, Plavix, losartan, Restasis, hydrocodone as needed, Plavix, Lipitor, metformin, choric, amitriptyline, Topamax, Ambien.Workup in the past has included MRI of brain in September 2008 which was normal, MR a head in September 2008 which was normal, EEG in September 2008 which showed left frontotemporal sharp waves. She was started on Topamax in March 2009 and was admitted in January 2009 for syncope and atypical chest pain. Dr Erling Cruz  felt she had a Hx of Sz.   She  reports that she had 2 passing out spells in the past year, one episode was 3 months ago at church, when she went to the bathroom and then she had another episode at home. She lives alone. She has sleep apnea, but does not use a CPAP machine, she uses oxygen at night. She has an aid 4 days a week for 2:15 hours each time.  She has several church friends that have keys to her apartment. Her son lives in [place] and her  daughter lives in [place],  [place]. She denies any recent seizure like episodes. She has LOC usually for 2-5 minutes. No Hx of tongue bite or bladder incontinence. She had back surgery last year. She has a longstanding Hx of migraines and has had more HAs. [name] felt a strong correlation between resurgence of her migraine headaches and the discontinuation of amitriptyline.  I would like to reinstate this medication 4. Her amitriptyline 50 mg by mouth at bedtime was discontinued in October she states I would like to reintroduce the medication today.  Since the patient is a history of morbid obesity, diabetes mellitus, and obstructive sleep apnea it is critical that she is not taking respiratory suppressants, sedative medication. Valium was prescribed by [name] she states for muscle spasms she has to be very careful with this medication not to enter was respiratory drive reduction. The same for the Hydrocort on oxycodone. To a lesser degree the same for Ambien. She has een Dr Rexene Alberts in 2014 and asked to follow up with me , which i will permit.   I have not changed, refilled or altered  any of the medications - again the patient is on Topiramate at this time but feels it has not given her the relief she needs.  She should be able to wean gradually off.   10-15-14  The patient's former primary care and cardiologist provider Dr. Montez Morita had seen the patient when amitriptyline and fell off her insurance approved formulary. He had tried another medication but to no avail. The patient has returned to take amitriptyline and now and her migraines have improved for almost 3 years now.  she has had  more breakthrough migraines over the last 7 months or so and she would like to dose increased.  They started mildly 7 months ago but that has been have progressed since then she was almost 3 years headache free. She had 2 "strange "headaches', one of the headaches lasted a few hours and involved the area at the occipital groove at the nape of the neck. She felt that this was a headache almost horizontal from ear to ear. A second headache arose to the top of the corona and only stayed for less than an hour.  Both spells were associated with phonophobia but not photo phobia and she was nauseated with either one of them.   Se endorsed a geriatric depression score at 11 points out of 15. That is significant. An antidepressant is used to treat her condition already.      Her Past Medical History Is Significant For: Past Medical History  Diagnosis Date  .  Myocardial infarction 1992 & 1998    NM Myocar Multi W/Spect W/Wall Motion EF on 08/07/10 - IMPRESSION: 1) No evidence of ischemia.  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  3) Normal LV wall motion.  4) Estimated Q G S EF 70%, unchanged.  . Asthma   . Blood transfusion   . GERD (gastroesophageal reflux disease)   . Hiatal hernia   . Other forms of migraine, without mention of intractable migraine without mention of status migrainosus   . Dry eye     Chronic  . Hypercholesteremia   . Spondylolisthesis   . Diverticulosis of colon (without mention of hemorrhage)     Colonoscopy 11/09/09 - FINDINGS: 1) 88mm polp in transverse colon, resected & retrieved.  2) Diverticulosis in sigmoid & descending colon.  3) Medium-sized, internal hemorrhoids.  Marland Kitchen  Internal hemorrhoids without mention of complication     Colonoscopy 11/09/09 - FINDINGS: 1) 54mm polp in transverse colon, resected & retrieved.  2) Diverticulosis in sigmoid & descending colon.  3) Medium-sized, internal hemorrhoids.  . Benign neoplasm of colon 11/09/09    Colonoscopy 11/09/09 - FINDINGS: 1) 3mm polp in transverse colon, resected & retrieved.  2) Diverticulosis in sigmoid & descending colon.  3) Medium-sized, internal hemorrhoids.  . Hemorrhage of rectum and anus     Colonoscopy 11/09/09 - FINDINGS: 1) 14mm polp in transverse colon, resected & retrieved.  2) Diverticulosis in sigmoid & descending colon.  3) Medium-sized, internal hemorrhoids.  . Essential hypertension, benign   . Esophagitis   . CMC arthritis     Left middle finger  . Depression     40+ years  . Diabetes mellitus 10/2008    type 2  . Breast cancer 1989    Left breast cancer  . Obese   . Syncope     MDT LinQ implanted for evaluation  . Facet arthropathy, lumbar     L3-4  . Obstructive sleep apnea     uses oxygen at night  . Allergy   . Anemia   . Arthritis   . Blood transfusion without reported diagnosis   .  Cataract   . Glaucoma   . Oxygen deficiency     .2   Her Past Surgical History Is Significant For: Past Surgical History  Procedure Laterality Date  . Abdominal hysterectomy  1978  . Joint replacement      lsft hip x2, rt knee  . Knee arthroscopy Right 2002    By Dr. Lorin Mercy  . Foot surgery Bilateral     Bil feet great toes and second toes spurs. By Dr. Milinda Pointer in 2004.  . Breast biopsy Left 1995    Biopsy at [facility] ([facility])  . Tonsillectomy    . Back surgery      x2 one for infection  . Cholecystectomy    . Total hip revision  12/14/2010    Procedure: TOTAL HIP REVISION;  Surgeon: Marybelle Killings;  Location: [place];  Service: Orthopedics;  Laterality: Left;  Left Total Hip Arthroplasty Revision, Poly and Ball Exchange, Possible Acetabular Revision vs. Poly Exchange  . Colonoscopy w/ polypectomy  11/09/09    FINDINGS: 1) 64mm polp in transverse colon, resected & retrieved.  2) Diverticulosis in sigmoid & descending colon.  3) Medium-sized, internal hemorrhoids.  . Loop recorder implant  11/15/06; 04/02/2013    MDT Reveal implanted 10/2006 for syncope; removed with MDT LinQ implanted 03/2013 by Dr Lovena Le  . Hemorrhoid surgery  2011    Internal & external hemorrhoidectomy (x) 3 quadrants  . Esophagogastroduodenoscopy  02/15/07  . Dilation and curettage of uterus    . Trigger finger release Left 01/02/2013    Procedure: RELEASE LEFT MIDDLE TRIGGER FINGER/LEFT INDEX AND LEFT RING FINGER;  Surgeon: Wynonia Sours, MD;  Location: [place];  Service: Orthopedics;  Laterality: Left;  ANESTHESIA: GENERAL/IV REGIONAL FAB  . Loop recorder implant N/A 04/02/2013    Procedure: LOOP RECORDER IMPLANT;  Surgeon: Evans Lance, MD;  Location: [facility] CATH LAB;  Service: Cardiovascular;  Laterality: N/A;  . Toe surgery     Her Family History Is Significant For: Family History  Problem Relation Age of Onset  . Pancreatic cancer Mother   . Heart attack Father   . Diabetes Son    . Arthritis Brother  Severe/Crippling  . Tuberculosis Brother   . Hypertension Brother     x 2  . Heart disease Sister   . Diabetes Brother   . Seizures Brother   . Autoimmune disease Brother     AIDS  . Colon cancer Brother   . Lung cancer Brother     mets    Her Social History Is Significant For: Social History   Social History  . Marital Status: Single    Spouse Name: N/A  . Number of Children: 2  . Years of Education: college   Occupational History  . Retired    Social History Main Topics  . Smoking status: Former Smoker    Types: Cigarettes    Quit date: 01/19/1983  . Smokeless tobacco: Never Used  . Alcohol Use: No  . Drug Use: No  . Sexual Activity: Not Asked   Other Topics Concern  . None   Social History Narrative   Patient is single and lives alone.   Patient has two children.   Patient is retired.   Patient has a college education.   Patient is right-handed.   Patient does not drink any caffeine.    Her Allergies Are:  Allergies  Allergen Reactions  . Contrast Media [Iodinated Diagnostic Agents] Anaphylaxis  . Gadolinium Anaphylaxis       . Aspirin Nausea And Vomiting    Fast heart rate  . Iohexol Other (See Comments)    unknown  . Trazodone And Nefazodone     Dizziness,lightheadness, heart felt funny  :  Her Current Medications Are:  Outpatient Encounter Prescriptions as of 10/15/2014  Medication Sig  . acetaminophen (TYLENOL) 325 MG tablet Take 2 tablets (650 mg total) by mouth every 4 (four) hours as needed for headache or mild pain.  Marland Kitchen albuterol (PROAIR HFA) 108 (90 BASE) MCG/ACT inhaler Inhale 2 puffs into the lungs every 6 (six) hours as needed for wheezing or shortness of breath.  Marland Kitchen amitriptyline (ELAVIL) 25 MG tablet Take one table by mouth every night at bedtime.  Marland Kitchen atorvastatin (LIPITOR) 40 MG tablet Take 40 mg by mouth daily.   . BELSOMRA 10 MG TABS TAKE 1 TABLET BY MOUTH EVERY NIGHT AT BEDTIME AS NEEDED FOR SLEEP  .  cholecalciferol (VITAMIN D) 1000 UNITS tablet Take 1,000 Units by mouth daily.  . clopidogrel (PLAVIX) 75 MG tablet Take 1 tablet (75 mg total) by mouth daily.  . cycloSPORINE (RESTASIS) 0.05 % ophthalmic emulsion Place 1 drop into both eyes 2 (two) times daily.   Marland Kitchen DEXILANT 60 MG capsule Take 1 capsule by mouth daily.   Marland Kitchen DEXILANT 60 MG capsule TAKE 1 CAPSULE BY MOUTH ONCE DAILY  . diazepam (VALIUM) 5 MG tablet TAKE 1 TABLET BY MOUTH TWICE DAILY FOR MUSCLE SPASMS  . diclofenac sodium (VOLTAREN) 1 % GEL Apply 1 application topically daily as needed (pain).   Marland Kitchen diltiazem (CARDIZEM CD) 180 MG 24 hr capsule TAKE 1 CAPSULE BY MOUTH ONCE DAILY  . Fluocinolone Acetonide 0.01 % OIL Place 2 drops into both ears daily. Does every two weeks  . fluticasone-salmeterol (ADVAIR HFA) 115-21 MCG/ACT inhaler Inhale 2 puffs into the lungs 2 (two) times daily.  Marland Kitchen gabapentin (NEURONTIN) 300 MG capsule TAKE ONE CAPSULE BY MOUTH TWICE DAILY AS NEEDED  . HYDROcodone-acetaminophen (NORCO) 10-325 MG per tablet 1 tablet every 6 (six) hours as needed.   . hyoscyamine (LEVSIN SL) 0.125 MG SL tablet PLACE 2 TABLETS UNDER THE TONGUE EVERY 4 HOURS AS NEEDED.  Marland Kitchen  ipratropium (ATROVENT) 0.06 % nasal spray Place 2 sprays into both nostrils 4 (four) times daily.  . isosorbide mononitrate (IMDUR) 60 MG 24 hr tablet Take 1 tablet (60 mg total) by mouth daily.  Marland Kitchen losartan (COZAAR) 100 MG tablet Take 1 tablet (100 mg total) by mouth daily.  . metoCLOPramide (REGLAN) 10 MG tablet Take one tab before each meal and at bedtime (Patient taking differently: Take one tablet by mouth before each meal and at bedtime)  . nitroGLYCERIN (NITROLINGUAL) 0.4 MG/SPRAY spray Place 1 spray under the tongue every 5 (five) minutes as needed. Chest pains  . olopatadine (PATANOL) 0.1 % ophthalmic solution INT 1 GTT  AEY BID 6-8 HRS APART  . OVER THE COUNTER MEDICATION Take 1 capsule by mouth daily. Hair skin and Nail  . Saxagliptin-Metformin 05-998 MG TB24  Take by mouth. Take one tablet every evening with a meal.  . Spacer/Aero-Holding [facility] Use as directed  . topiramate (TOPAMAX) 100 MG tablet TAKE 1 TABLET BY MOUTH EVERY EVENING.  Marland Kitchen topiramate (TOPAMAX) 25 MG tablet TAKE 1 TABLET BY MOUTH EVERY MORNING.   No facility-administered encounter medications on file as of 10/15/2014.  :  Review of Systems   The patient endorsed a geriatric depression scale at 9 points there was no sleepiness assessment given tremor or fatigue assessment there is no headache diary provided. Review of systems is widely endures before fatigue, neck stiffness, hearing loss, ear pain, shortness of breath and chest tightness, I pain, light sensitivity and itching of the eye, chest pain palpitations, abdominal pain, rectal bleeding and black stools, constipation and nausea, insomnia, daytime sleepiness, anemia, dizziness, headache, passing out spells and depression.    General: The patient is awake, alert and appears not in acute distress. The patient is well groomed. Head: Normocephalic, atraumatic.  Neck is supple. Mallampati 3, I was unable to see a uvula. , no TMJ and no retrognathia.  neck circumference: 16 inches.  Cardiovascular:  Regular rate and rhythm, without  murmurs or carotid bruit, and without distended neck veins. Respiratory: Lungs are clear to auscultation. Skin:  Without evidence of edema, or rash Trunk: BMI is morbidly elevated and patient  has normal posture.   Neurologic exam : The patient is awake and alert, oriented to place and time.  Memory subjective  described as impaired for word finding .   There is a normal attention span & concentration ability.  Speech is fluent without dysarthria, dysphonia or aphasia.  Mood and affect are depressed, pleating for pain medications. GDS 11 points on 10-15-14.  Cranial nerves:  No change of smell and taste . Pupils are equal reactive to light. - glaucoma suspected in both eyes. .  Extraocular  movements  in vertical and horizontal planes intact and without nystagmus. Visual fields by finger perimetry are intact. Hearing to finger rub reduced right over left.  Facial sensation intact to fine touch. Facial motor strength is symmetric and tongue and uvula move midline.  Motor exam:   Pain and crepitation over multiple joints. The patient reports increasing stiffness in both legs .  Sensory:  Fine touch, pinprick and vibration were tested in all extremities. Proprioception is tested in the upper extremities only. This was normal.  Coordination: Rapid alternating movements in the fingers/hands is tested and normal. Finger-to-nose maneuver tested  without evidence of ataxia, or tremor.  Dysmetria on the left, but patient wears a brace.  The patient cannot perform a heel-to-shin maneuver there is pain with flexion as  well as with the rotation necessary to perform this. She reports that the stiffness in her legs has been present for a good while. She had some improvement after being given steroid injections into her back she reports.  Gait and station: Patient walks without assistive device.Stance is wider based than  normal. Tandem gait deferred - the patient placed the lateral side of her foot  first  . Astasia/ abasia.  Please note that her  Romberg testing is normal.  Deep tendon reflexes: in the  upper and lower extremities are symmetric and intact. Babinski maneuver response is equivocal..   Assessment:  After physical and neurologic examination, review of laboratory studies, imaging, neurophysiology testing and pre-existing records;  [name] chief complaint listed as seizures is not what she came to see me for. . 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 She has seen a cardiologist Dr. Lovena Le.  The spell she has described  Included symptoms that the pattern of a brief vertigo , followed by collapse,  not  a sense of doom. sjhe deno ies ever having felt a wave  rising from her stomach , or deja vu. no tonic extension, no staring attacks. There has never been a  post ictal component. During her hospitalization in 2008 an EEG was obtained,  which showed left frontal temporal sharp waves.  I would like to repeat this EEG.   In addition her main concern today is PAIN,  Since neurology does not treat joint pain, or back pain,  orthopedic pain or head pain, I will leave this to her primary physician's orthopedic and neurosurgeon.   2)She does have at least 4 or severe migraines a month she describes amongst other type of headaches that seem not to be quite as severe. Interestingly she felt that amitriptyline was able to prevent some of these spells and that she has had an increasing frequency of migraine is the bands since this medication was longer available to her. She states also that she fell topiramate had not made a difference for her headaches, nor for spells.  Plan:  Treatment plan and additional workup : [name] presented today with a resurgence of headaches that have been absent for almost 3 years. Over the last 7 months she had a slow increase in return of migraines and she is interested in using the medication she was already familiar with at higher doses to treat the headaches. She described a new onset type of headache which I outlined in my progress note and I identified another type of migrainous headache. She awaits a total knee replacement on the left side in the near future. Today's visit was 25 minutes with over 50% of our face-to-face time dedicated to the differentiation of her headache disorder, her comorbidities and development since our last visit and refilling and increasing the dose of the medication.  I suggested no follow up in neurology , except for migraine treatment. I will give her a higher dose of Neurontin at night hoping that this will help her insomnia and headache complaints. It may also help her with her overall pain of  arthritis and joints. I will also double her amitriptyline dose to Elavil 50 mg at night. She may develop a dry mouth in some cases try cyclic anti-depressions have led to EKG changes. I will asked Dr. Doug Sou, MD  to keep an eye on this. Dr. Osie Cheeks is her electrophysiologist-cardiologist. The patient was referred for pain management. I answered  all her questions today and the patient was in agreement with the above outlined plan.

## 2014-10-16 ENCOUNTER — Encounter: Payer: Self-pay | Admitting: Podiatry

## 2014-10-16 ENCOUNTER — Ambulatory Visit (INDEPENDENT_AMBULATORY_CARE_PROVIDER_SITE_OTHER): Payer: Medicare Other | Admitting: Podiatry

## 2014-10-16 VITALS — BP 135/71 | HR 99 | Resp 14

## 2014-10-16 DIAGNOSIS — M79676 Pain in unspecified toe(s): Secondary | ICD-10-CM | POA: Diagnosis not present

## 2014-10-16 DIAGNOSIS — B351 Tinea unguium: Secondary | ICD-10-CM

## 2014-10-16 NOTE — Patient Instructions (Signed)
Diabetes and Foot Care Diabetes may cause you to have problems because of poor blood supply (circulation) to your feet and legs. This may cause the skin on your feet to become thinner, break easier, and heal more slowly. Your skin may become dry, and the skin may peel and crack. You may also have nerve damage in your legs and feet causing decreased feeling in them. You may not notice minor injuries to your feet that could lead to infections or more serious problems. Taking care of your feet is one of the most important things you can do for yourself.  HOME CARE INSTRUCTIONS  Wear shoes at all times, even in the house. Do not go barefoot. Bare feet are easily injured.  Check your feet daily for blisters, cuts, and redness. If you cannot see the bottom of your feet, use a mirror or ask someone for help.  Wash your feet with warm water (do not use hot water) and mild soap. Then pat your feet and the areas between your toes until they are completely dry. Do not soak your feet as this can dry your skin.  Apply a moisturizing lotion or petroleum jelly (that does not contain alcohol and is unscented) to the skin on your feet and to dry, brittle toenails. Do not apply lotion between your toes.  Trim your toenails straight across. Do not dig under them or around the cuticle. File the edges of your nails with an emery board or nail file.  Do not cut corns or calluses or try to remove them with medicine.  Wear clean socks or stockings every day. Make sure they are not too tight. Do not wear knee-high stockings since they may decrease blood flow to your legs.  Wear shoes that fit properly and have enough cushioning. To break in new shoes, wear them for just a few hours a day. This prevents you from injuring your feet. Always look in your shoes before you put them on to be sure there are no objects inside.  Do not cross your legs. This may decrease the blood flow to your feet.  If you find a minor scrape,  cut, or break in the skin on your feet, keep it and the skin around it clean and dry. These areas may be cleansed with mild soap and water. Do not cleanse the area with peroxide, alcohol, or iodine.  When you remove an adhesive bandage, be sure not to damage the skin around it.  If you have a wound, look at it several times a day to make sure it is healing.  Do not use heating pads or hot water bottles. They may burn your skin. If you have lost feeling in your feet or legs, you may not know it is happening until it is too late.  Make sure your health care provider performs a complete foot exam at least annually or more often if you have foot problems. Report any cuts, sores, or bruises to your health care provider immediately. SEEK MEDICAL CARE IF:   You have an injury that is not healing.  You have cuts or breaks in the skin.  You have an ingrown nail.  You notice redness on your legs or feet.  You feel burning or tingling in your legs or feet.  You have pain or cramps in your legs and feet.  Your legs or feet are numb.  Your feet always feel cold. SEEK IMMEDIATE MEDICAL CARE IF:   There is increasing redness,   swelling, or pain in or around a wound.  There is a red line that goes up your leg.  Pus is coming from a wound.  You develop a fever or as directed by your health care provider.  You notice a bad smell coming from an ulcer or wound. Document Released: 01/02/2000 Document Revised: 09/06/2012 Document Reviewed: 06/13/2012 Exit[employer] Patient Information 2015 [employer]. This information is not intended to replace advice given to you by your health care provider. Make sure you discuss any questions you have with your health care provider.  

## 2014-10-16 NOTE — Progress Notes (Signed)
   Subjective:    Patient ID: [name], female    DOB: [birth year], 69 y.o.   MRN: [id number]  HPI  This patient presents today for schedule visit complaining of painful toenails and requesting nail debridement  Review of Systems  All other systems reviewed and are negative.      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

## 2014-10-17 ENCOUNTER — Telehealth: Payer: Self-pay | Admitting: Internal Medicine

## 2014-10-17 ENCOUNTER — Other Ambulatory Visit: Payer: Self-pay

## 2014-10-17 DIAGNOSIS — N644 Mastodynia: Secondary | ICD-10-CM

## 2014-10-17 DIAGNOSIS — Z1231 Encounter for screening mammogram for malignant neoplasm of breast: Secondary | ICD-10-CM

## 2014-10-17 NOTE — Telephone Encounter (Signed)
Placed.

## 2014-10-17 NOTE — Telephone Encounter (Signed)
Patient is requesting a referral for a diagnostic mammogram. She is having some discomfort in left breast. She uses [facility]. No appt set yet.

## 2014-10-21 NOTE — Telephone Encounter (Signed)
Thank you :)

## 2014-10-23 ENCOUNTER — Encounter: Payer: Self-pay | Admitting: Internal Medicine

## 2014-10-25 ENCOUNTER — Telehealth: Payer: Self-pay | Admitting: Internal Medicine

## 2014-10-25 NOTE — Telephone Encounter (Signed)
New Message  Pt following up on surgical clearance that was sent a month ago by Dr Lorin Mercy at [facility]- per pt. Please call back and discuss.

## 2014-10-25 NOTE — Telephone Encounter (Signed)
New message     Please fax clearance letter to Dr Rodell Perna at [phone].  Pt is needing knee replacement surgery.

## 2014-10-25 NOTE — Telephone Encounter (Signed)
Clearance was faxed 10/02/14.  Spoke with patient and let her know we will send again

## 2014-10-25 NOTE — Telephone Encounter (Signed)
Patient states that Dr. Duaine Dredge office told her that you can type a letter clearing her. She was in last month and saw you. If this is not correct, I will call the patient back and have her call that office to fax [place] the form.

## 2014-10-25 NOTE — Telephone Encounter (Signed)
Have we gotten such a form?

## 2014-10-25 NOTE — Telephone Encounter (Signed)
Pt called in and wanted to know the status of her Surgical Clearances form that was suppose to be faxed over to Dr yate office, her Eulogio Bear ped

## 2014-10-25 NOTE — Telephone Encounter (Signed)
Kim in medical records faxed to Dr Lorin Mercy

## 2014-10-28 DIAGNOSIS — M545 Low back pain: Secondary | ICD-10-CM | POA: Diagnosis not present

## 2014-10-28 DIAGNOSIS — M961 Postlaminectomy syndrome, not elsewhere classified: Secondary | ICD-10-CM | POA: Diagnosis not present

## 2014-10-28 DIAGNOSIS — M47816 Spondylosis without myelopathy or radiculopathy, lumbar region: Secondary | ICD-10-CM | POA: Diagnosis not present

## 2014-10-28 DIAGNOSIS — F411 Generalized anxiety disorder: Secondary | ICD-10-CM | POA: Diagnosis not present

## 2014-10-28 DIAGNOSIS — M5416 Radiculopathy, lumbar region: Secondary | ICD-10-CM | POA: Diagnosis not present

## 2014-10-28 NOTE — Telephone Encounter (Signed)
Printed and signed, please fax to Dr. Lorin Mercy office.

## 2014-10-28 NOTE — Telephone Encounter (Signed)
faxed

## 2014-10-30 ENCOUNTER — Telehealth: Payer: Self-pay | Admitting: *Deleted

## 2014-10-30 ENCOUNTER — Other Ambulatory Visit: Payer: Self-pay | Admitting: Gastroenterology

## 2014-10-30 ENCOUNTER — Other Ambulatory Visit: Payer: Self-pay | Admitting: Internal Medicine

## 2014-10-30 ENCOUNTER — Telehealth: Payer: Self-pay

## 2014-10-30 NOTE — Telephone Encounter (Signed)
[facility] requesting further information on mammogram order. Will call pt to obtain information

## 2014-10-30 NOTE — Telephone Encounter (Signed)
Left msg on triage stating fax over medcial clearance form month ago. Faxing another one wanting to know is patient cleared to have total (L) Knee replacement...Johny Chess

## 2014-10-30 NOTE — Telephone Encounter (Signed)
Pt called in said that Dr Lorin Mercy office is saying they they have not rec'd this.  Fax number is [phone]

## 2014-10-30 NOTE — Telephone Encounter (Signed)
Refill request was sent for a 90 day for IMDUR 60 mg and Cozaar 100 mg. Pt has an appointment in Dec. And right now she has enough refills to hold until then.

## 2014-10-31 ENCOUNTER — Other Ambulatory Visit: Payer: Self-pay

## 2014-10-31 NOTE — Telephone Encounter (Signed)
Rx faxed back to physician pharmacy...Johny Chess

## 2014-10-31 NOTE — Telephone Encounter (Signed)
Notified Cheryl with md response she stated they did not receive. Reprinted letter verified their fax # faxing to [phone],,,/[facility]

## 2014-10-31 NOTE — Telephone Encounter (Signed)
This letter for clearance is available to view under letters and was printed, signed, and faxed to Dr. Lorin Mercy office as soon as we got the information from the patient about what was needed on 10/28/14.

## 2014-11-01 ENCOUNTER — Telehealth: Payer: Self-pay | Admitting: *Deleted

## 2014-11-01 ENCOUNTER — Other Ambulatory Visit: Payer: Self-pay

## 2014-11-01 MED ORDER — NITROGLYCERIN 0.4 MG/SPRAY TL SOLN: 1.0000 | Status: DC | PRN

## 2014-11-01 NOTE — Telephone Encounter (Signed)
Called pt on [name] behalf to get more specifics on breast pain. Overall pain or one specific area?

## 2014-11-01 NOTE — Telephone Encounter (Signed)
Evans Lance, MD at 01/01/2014 4:49 PM  nitroGLYCERIN (NITROLINGUAL) 0.4 MG/SPRAY sprayPlace 1 spray under the tongue every 5 (five) minutes as needed. Chest pains Patient Instructions     Your physician wants you to follow-up in: 6 months with Dr. Knox Saliva will receive a reminder letter in the mail two months in advance. If you don't receive a letter, please call our office to schedule the follow-up appointment.

## 2014-11-01 NOTE — Telephone Encounter (Signed)
Called patient to ask her to send manual LINQ transmission for Dr. Lovena Le to review.  Patient voices understanding and states she will send transmission tonight after she eats dinner.  Patient denies any additional questions or concerns at this time.

## 2014-11-01 NOTE — Telephone Encounter (Signed)
Manual LINQ transmission received.  Placed in Dr. Macon Large folder for review.  If any further recommendations, will call patient.

## 2014-11-02 DIAGNOSIS — J45998 Other asthma: Secondary | ICD-10-CM | POA: Diagnosis not present

## 2014-11-03 NOTE — Progress Notes (Signed)
  Carelink summary report received. Battery status OK. Normal device function. No new symptom or tachy episodes. 8 pause episodes and 2 brady episodes--all undersensing. No new AF episodes. Monthly summary reports and ROV with GT in 12/2014.

## 2014-11-04 ENCOUNTER — Other Ambulatory Visit: Payer: Medicare Other

## 2014-11-04 LAB — CUP PACEART REMOTE DEVICE CHECK: Date Time Interrogation Session: 20160819213655

## 2014-11-05 ENCOUNTER — Encounter: Payer: Self-pay | Admitting: Internal Medicine

## 2014-11-05 ENCOUNTER — Ambulatory Visit (INDEPENDENT_AMBULATORY_CARE_PROVIDER_SITE_OTHER): Payer: Medicare Other | Admitting: *Deleted

## 2014-11-05 DIAGNOSIS — M47816 Spondylosis without myelopathy or radiculopathy, lumbar region: Secondary | ICD-10-CM | POA: Diagnosis not present

## 2014-11-05 DIAGNOSIS — I10 Essential (primary) hypertension: Secondary | ICD-10-CM | POA: Diagnosis not present

## 2014-11-05 DIAGNOSIS — R55 Syncope and collapse: Secondary | ICD-10-CM

## 2014-11-06 ENCOUNTER — Ambulatory Visit (INDEPENDENT_AMBULATORY_CARE_PROVIDER_SITE_OTHER): Payer: Medicare Other | Admitting: Neurology

## 2014-11-06 ENCOUNTER — Other Ambulatory Visit (HOSPITAL_COMMUNITY): Payer: Self-pay | Admitting: Orthopaedic Surgery

## 2014-11-06 DIAGNOSIS — G3184 Mild cognitive impairment, so stated: Secondary | ICD-10-CM

## 2014-11-06 DIAGNOSIS — R55 Syncope and collapse: Secondary | ICD-10-CM

## 2014-11-06 DIAGNOSIS — G4701 Insomnia due to medical condition: Secondary | ICD-10-CM

## 2014-11-06 DIAGNOSIS — M170 Bilateral primary osteoarthritis of knee: Secondary | ICD-10-CM

## 2014-11-06 DIAGNOSIS — G43511 Persistent migraine aura without cerebral infarction, intractable, with status migrainosus: Secondary | ICD-10-CM

## 2014-11-06 DIAGNOSIS — M5136 Other intervertebral disc degeneration, lumbar region: Secondary | ICD-10-CM

## 2014-11-07 NOTE — Progress Notes (Signed)
Loop recorder 

## 2014-11-08 NOTE — Procedures (Signed)
[facility]  EEG (ELECTROENCEPHALOGRAM) REPORT   STUDY DATE:  11-06-2014    PATIENT NAME: [name]  MRN: [id number]   ORDERING CLINICIAN: Larey Seat, MD   TECHNOLOGIST: Laretta Alstrom TECHNIQUE: Electroencephalogram was recorded utilizing standard 10-20 system of lead placement and reformatted into average and bipolar montages.    RECORDING TIME: 34.3 minutes  ACTIVATION:  strobe lights but no hyperventilation    CLINICAL INFORMATION:  Mrs. Ronnald Ramp has suffered sudden falls. She feels slightly lightheaded and sensed palpitations before she fell, became unaware of her surroundings for a duration of about 10-15 seconds, very briefly. During the spell her skin was clammy and sweaty she felt nauseated afterwards and her chest was tight. She did not suffer any tongue bite there was no witnessed tonic-clonic activity and no incontinence of bowel or bladder. This patient has a history of myocardial infarction, asthma, hiatal hernia, essential hypertension, depression, headaches. The previous EEG almost 10 years ago was obtained during a hospitalization and interpreted as showing left frontal temporal sharp waves. This could not be seen in this study.   FINDINGS: A posterior dominant rhythm of 10 Hz is noted, photic entrainment is minimal and symmetrically noted. With eye closure there is attenuation of the posterior dominant rhythm. The EEG has a normal amplitude no epileptiform discharges or dysrhythmias are noted at the patient did not enter sleep. Her EKG showed normal sinus rhythm between 78 and 84 bpm.  The patient also did not suffer any spells while being recorded.   IMPRESSION:  This EEG is normal for the patient's age and gender,       Larey Seat , MD

## 2014-11-11 ENCOUNTER — Telehealth: Payer: Self-pay

## 2014-11-11 NOTE — Telephone Encounter (Signed)
Advised pt that her EEG was normal. Pt verbalized understanding.

## 2014-11-11 NOTE — Telephone Encounter (Signed)
-----   Message from Larey Seat, MD sent at 11/08/2014 12:27 PM EDT ----- Normal EEG

## 2014-11-14 DIAGNOSIS — M1611 Unilateral primary osteoarthritis, right hip: Secondary | ICD-10-CM | POA: Diagnosis not present

## 2014-11-14 DIAGNOSIS — M25551 Pain in right hip: Secondary | ICD-10-CM | POA: Diagnosis not present

## 2014-11-15 ENCOUNTER — Encounter (HOSPITAL_COMMUNITY)
Admission: RE | Admit: 2014-11-15 | Discharge: 2014-11-15 | Disposition: A | Discharge status: Home / Self Care | Payer: Medicare Other | Source: Ambulatory Visit | Attending: Orthopaedic Surgery | Admitting: Orthopaedic Surgery

## 2014-11-15 ENCOUNTER — Ambulatory Visit (HOSPITAL_COMMUNITY)
Admission: RE | Admit: 2014-11-15 | Discharge: 2014-11-15 | Disposition: A | Discharge status: Home / Self Care | Payer: Medicare Other | Source: Ambulatory Visit | Attending: Orthopaedic Surgery | Admitting: Orthopaedic Surgery

## 2014-11-15 ENCOUNTER — Encounter (HOSPITAL_COMMUNITY): Payer: Self-pay

## 2014-11-15 DIAGNOSIS — M179 Osteoarthritis of knee, unspecified: Secondary | ICD-10-CM

## 2014-11-15 DIAGNOSIS — M1710 Unilateral primary osteoarthritis, unspecified knee: Secondary | ICD-10-CM

## 2014-11-15 DIAGNOSIS — I10 Essential (primary) hypertension: Secondary | ICD-10-CM | POA: Diagnosis not present

## 2014-11-15 HISTORY — DX: Chronic kidney disease, unspecified: N18.9

## 2014-11-15 LAB — URINALYSIS, ROUTINE W REFLEX MICROSCOPIC
Bilirubin Urine: NEGATIVE
Glucose, UA: 250 mg/dL — AB
Hgb urine dipstick: NEGATIVE
Ketones, ur: NEGATIVE mg/dL
LEUKOCYTES UA: NEGATIVE
NITRITE: NEGATIVE
PH: 7 (ref 5.0–8.0)
Protein, ur: NEGATIVE mg/dL
SPECIFIC GRAVITY, URINE: 1.02 (ref 1.005–1.030)
UROBILINOGEN UA: 1 mg/dL (ref 0.0–1.0)

## 2014-11-15 LAB — COMPREHENSIVE METABOLIC PANEL
ALBUMIN: 3.8 g/dL (ref 3.5–5.0)
ALT: 15 U/L (ref 14–54)
AST: 20 U/L (ref 15–41)
Alkaline Phosphatase: 68 U/L (ref 38–126)
Anion gap: 11 (ref 5–15)
BUN: 12 mg/dL (ref 6–20)
CHLORIDE: 105 mmol/L (ref 101–111)
CO2: 19 mmol/L — AB (ref 22–32)
CREATININE: 0.84 mg/dL (ref 0.44–1.00)
Calcium: 9.1 mg/dL (ref 8.9–10.3)
GFR calc Af Amer: >60 mL/min (ref >60)
GLUCOSE: 246 mg/dL — AB (ref 65–99)
POTASSIUM: 3.8 mmol/L (ref 3.5–5.1)
SODIUM: 135 mmol/L (ref 135–145)
Total Bilirubin: 0.3 mg/dL (ref 0.3–1.2)
Total Protein: 7.5 g/dL (ref 6.5–8.1)

## 2014-11-15 LAB — CBC
HCT: 34.6 % — ABNORMAL LOW (ref 36.0–46.0)
Hemoglobin: 10.7 g/dL — ABNORMAL LOW (ref 12.0–15.0)
MCH: 26.1 pg (ref 26.0–34.0)
MCHC: 30.9 g/dL (ref 30.0–36.0)
MCV: 84.4 fL (ref 78.0–100.0)
PLATELETS: 387 10*3/uL (ref 150–400)
RBC: 4.1 MIL/uL (ref 3.87–5.11)
RDW: 16 % — ABNORMAL HIGH (ref 11.5–15.5)
WBC: 16.7 10*3/uL — AB (ref 4.0–10.5)

## 2014-11-15 LAB — PROTIME-INR
INR: 1.05 (ref 0.00–1.49)
Prothrombin Time: 13.9 seconds (ref 11.6–15.2)

## 2014-11-15 LAB — GLUCOSE, CAPILLARY: GLUCOSE-CAPILLARY: 229 mg/dL — AB (ref 65–99)

## 2014-11-15 LAB — SURGICAL PCR SCREEN
MRSA, PCR: NEGATIVE
Staphylococcus aureus: NEGATIVE

## 2014-11-15 NOTE — Pre-Procedure Instructions (Signed)
[name]  11/15/2014      [facility], [place], [place] [address] [place] [address] Phone: [phone] Fax: [place] [address] - [place], [place] [address] [place] [address] Phone: [phone] Fax: [phone]    Your procedure is scheduled on Nov 9th.  Report to [facility]mitting at [phone].  Call this number if you have problems the morning of surgery:  [phone]   Remember:  Do not eat food or drink liquids after midnight.  Take these medicines the morning of surgery with A SIP OF WATER Tylenol if needed, Dexilant, diazepam (Valium), diltiazem (Cardizem), Advair and Proair inhaler-bring with you on the day of surgery, hydrocodone (Norco) if needed for pain, Atrovent nasal spray if needed,  Isosorbide mononitrate (Imdur), Nitroglycerin if needed, Topiramate (Topamax)  Stop taking aspirin, Ibuprofen, BC's, Goody's, Herbal medications, Fish Oil, Aleve Stop taking plavix as directed by your Dr. Francene Finders to Manage Your Diabetes Before Surgery   Why is it important to control my blood sugar before and after surgery?   Improving blood sugar levels before and after surgery helps healing and can limit problems.  A way of improving blood sugar control is eating a healthy diet by:  - Eating less sugar and carbohydrates  - Increasing activity/exercise  - Talk with your doctor about reaching your blood sugar goals  High blood sugars (greater than 180 mg/dL) can raise your risk of infections and slow down your recovery so you will need to focus on controlling your diabetes during the weeks before surgery.  Make sure that the doctor who takes care of your diabetes knows about your planned surgery including the date and location.  How do I manage my blood sugars before surgery?   Check your blood sugar  at least 4 times a day, 2 days before surgery to make sure that they are not too high or low.   Check your blood sugar the morning of your surgery when you wake up and every 2               hours until you get to the Short-Stay unit.  If your blood sugar is less than 70 mg/dL, you will need to treat for low blood sugar by:  Treat a low blood sugar (less than 70 mg/dL) with 1/2 cup of clear juice (cranberry or apple), 4 glucose tablets, OR glucose gel.  Recheck blood sugar in 15 minutes after treatment (to make sure it is greater than 70 mg/dL).  If blood sugar is not greater than 70 mg/dL on re-check, call [phone] for further instructions.   Report your blood sugar to the Short-Stay nurse when you get to Short-Stay.  References:  [facility], 2007 "How to Manage your Diabetes Before and After Surgery".  What do I do about my diabetes medications?   Do not take oral diabetes medicines (pills) the morning of surgery. Saxagiliptin-Metformin   Do not wear jewelry, make-up or nail polish.  Do not wear lotions, powders, or perfumes.  You may wear deodorant.  Do not shave 48 hours prior to surgery.  Men may shave face and neck.  Do not bring valuables to the hospital.  [facility] is not responsible for any belongings or valuables.  Contacts, dentures or bridgework may not be  worn into surgery.  Leave your suitcase in the car.  After surgery it may be brought to your room.  For patients admitted to the hospital, discharge time will be determined by your treatment team.  Patients discharged the day of surgery will not be allowed to drive home.    Special instructions:  [place] - Preparing for Surgery  Before surgery, you can play an important role.  Because skin is not sterile, your skin needs to be as free of germs as possible.  You can reduce the number of germs on you skin by washing with CHG (chlorahexidine gluconate) soap before surgery.  CHG is an  antiseptic cleaner which kills germs and bonds with the skin to continue killing germs even after washing.  Please DO NOT use if you have an allergy to CHG or antibacterial soaps.  If your skin becomes reddened/irritated stop using the CHG and inform your nurse when you arrive at Short Stay.  Do not shave (including legs and underarms) for at least 48 hours prior to the first CHG shower.  You may shave your face.  Please follow these instructions carefully:   1.  Shower with CHG Soap the night before surgery and the   morning of Surgery.  2.  If you choose to wash your hair, wash your hair first as usual with your   normal shampoo.  3.  After you shampoo, rinse your hair and body thoroughly to remove the  Shampoo.  4.  Use CHG as you would any other liquid soap.  You can apply chg directly  to the skin and wash gently with scrungie or a clean washcloth.  5.  Apply the CHG Soap to your body ONLY FROM THE NECK DOWN.   Do not use on open wounds or open sores.  Avoid contact with your eyes,   ears, mouth and genitals (private parts).  Wash genitals (private parts)  with your normal soap.  6.  Wash thoroughly, paying special attention to the area where your surgery  will be performed.  7.  Thoroughly rinse your body with warm water from the neck down.  8.  DO NOT shower/wash with your normal soap after using and rinsing off  the CHG Soap.  9.  Pat yourself dry with a clean towel.            10.  Wear clean pajamas.            11.  Place clean sheets on your bed the night of your first shower and do not  sleep with pets.  Day of Surgery  Do not apply any lotions/deoderants the morning of surgery.  Please wear clean clothes to the hospital/surgery center.   Please read over the following fact sheets that you were given. Pain Booklet, Coughing and Deep Breathing, Total Joint Packet and MRSA Information

## 2014-11-15 NOTE — Progress Notes (Signed)
PCP is Dr Sharlet Salina- clearance note in chart under letters Cardiologist is Dr Lovena Le- Pt has a loop recorder Pt reports that she wears o2 when she sleeps Reports her blood sugars normally run 110-120's fasting. She states she is to stop plavix 5 days before her surgery.

## 2014-11-18 NOTE — Progress Notes (Signed)
Anesthesia Chart Review:  Pt is 69 year old female scheduled for computer assisted L total knee arthroplasty on 11/27/2014 with Dr. Lorin Mercy.   PCP is Dr. Pricilla Holm who medically cleared pt for surgery (can be found in letters tab dated 10/28/14). Cardiologist is Dr. [name] who cleared pt from cardiac standpoint (can be found in correspondence tab dated 10/02/14).   PMH includes: MI (1992, 1998; by notes normal coronaries in 1998), coronary spasm (on imdur), HTN, DM, OSA (uses O2 at night), dysrhythmia, glaucoma, anemia, asthma, GERD, breast cancer, syncope (pt has loop recorder). Former smoker. BMI 38. S/p trigger finger release 01/02/13. 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   Medications include: lipitor, plavix, diltiazem, advair, atrovent, imdur, losartan, saxagliptin-metformin.   Preoperative labs reviewed.  WBC 16.7.  Called and spoke with pt by phone. She denies feeling sick or recent illnesses. Denies any changes in respiratory status or skin, no sores in mouth (pt uses dentures, no teeth), no urinary symptoms, no fever or chills. Will repeat CBC DOS. Left voicemail for [facility] in Dr. Lorin Mercy' office about WBC.   Chest x-ray 11/15/14 reviewed. No active disease or change from 2015.   EKG 12/23/13: NSR. LAD. Low voltage QRS. Nonspecific T wave abnormality  Nuclear stress test 12/22/13:  1. No reversible ischemia or infarction. 2. Normal left ventricular wall motion. 3. Left ventricular ejection fraction 42% 4. Low-risk stress test findings  If labs acceptable DOS, I anticipate pt can proceed as scheduled.   Willeen Cass, FNP-BC [facility]/Anesthesiology Phone: [phone] 11/18/2014 4:56 PM

## 2014-11-20 ENCOUNTER — Encounter: Payer: Self-pay | Admitting: Internal Medicine

## 2014-11-26 LAB — CUP PACEART REMOTE DEVICE CHECK: Date Time Interrogation Session: 20161018220820

## 2014-11-26 NOTE — Progress Notes (Signed)
Carelink summary report received. Battery status OK. Normal device function. No new symptom, tachy episodes, or brady episodes. 1 pause episode--undersensing due to variable R-wave amplitude. 1 AF episode, ?indeterminate due to artifact, duration 43min, peak V 88bpm, +Plavix. Monthly summary reports and ROV with GT on 01/03/15 at 12:00pm.

## 2014-11-27 ENCOUNTER — Inpatient Hospital Stay (HOSPITAL_COMMUNITY): Payer: Medicare Other | Admitting: Emergency Medicine

## 2014-11-27 ENCOUNTER — Inpatient Hospital Stay (HOSPITAL_COMMUNITY): Payer: Medicare Other

## 2014-11-27 ENCOUNTER — Telehealth: Payer: Self-pay | Admitting: *Deleted

## 2014-11-27 ENCOUNTER — Inpatient Hospital Stay (HOSPITAL_COMMUNITY)
Admission: RE | Admit: 2014-11-27 | Discharge: 2014-11-29 | DRG: 470 | Disposition: A | Discharge status: Skilled Nursing Facility | Payer: Medicare Other | Source: Ambulatory Visit | Attending: Orthopaedic Surgery | Admitting: Orthopaedic Surgery

## 2014-11-27 ENCOUNTER — Inpatient Hospital Stay (HOSPITAL_COMMUNITY): Payer: Medicare Other | Admitting: Certified Registered Nurse Anesthetist

## 2014-11-27 ENCOUNTER — Encounter (HOSPITAL_COMMUNITY): Payer: Self-pay | Admitting: Certified Registered"

## 2014-11-27 ENCOUNTER — Encounter (HOSPITAL_COMMUNITY)
Admission: RE | Disposition: A | Discharge status: Skilled Nursing Facility | Payer: Self-pay | Source: Ambulatory Visit | Attending: Orthopaedic Surgery

## 2014-11-27 DIAGNOSIS — E669 Obesity, unspecified: Secondary | ICD-10-CM | POA: Diagnosis not present

## 2014-11-27 DIAGNOSIS — Z6838 Body mass index (BMI) 38.0-38.9, adult: Secondary | ICD-10-CM | POA: Diagnosis not present

## 2014-11-27 DIAGNOSIS — G4733 Obstructive sleep apnea (adult) (pediatric): Secondary | ICD-10-CM | POA: Diagnosis present

## 2014-11-27 DIAGNOSIS — I10 Essential (primary) hypertension: Secondary | ICD-10-CM | POA: Diagnosis present

## 2014-11-27 DIAGNOSIS — I129 Hypertensive chronic kidney disease with stage 1 through stage 4 chronic kidney disease, or unspecified chronic kidney disease: Secondary | ICD-10-CM | POA: Diagnosis not present

## 2014-11-27 DIAGNOSIS — Z9889 Other specified postprocedural states: Secondary | ICD-10-CM | POA: Diagnosis not present

## 2014-11-27 DIAGNOSIS — I4891 Unspecified atrial fibrillation: Secondary | ICD-10-CM | POA: Diagnosis present

## 2014-11-27 DIAGNOSIS — Z09 Encounter for follow-up examination after completed treatment for conditions other than malignant neoplasm: Secondary | ICD-10-CM

## 2014-11-27 DIAGNOSIS — K209 Esophagitis, unspecified: Secondary | ICD-10-CM | POA: Diagnosis not present

## 2014-11-27 DIAGNOSIS — E119 Type 2 diabetes mellitus without complications: Secondary | ICD-10-CM | POA: Diagnosis not present

## 2014-11-27 DIAGNOSIS — G4701 Insomnia due to medical condition: Secondary | ICD-10-CM | POA: Diagnosis present

## 2014-11-27 DIAGNOSIS — M25562 Pain in left knee: Secondary | ICD-10-CM | POA: Diagnosis not present

## 2014-11-27 DIAGNOSIS — Z7984 Long term (current) use of oral hypoglycemic drugs: Secondary | ICD-10-CM

## 2014-11-27 DIAGNOSIS — K219 Gastro-esophageal reflux disease without esophagitis: Secondary | ICD-10-CM | POA: Diagnosis not present

## 2014-11-27 DIAGNOSIS — F329 Major depressive disorder, single episode, unspecified: Secondary | ICD-10-CM | POA: Diagnosis not present

## 2014-11-27 DIAGNOSIS — Z471 Aftercare following joint replacement surgery: Secondary | ICD-10-CM | POA: Diagnosis not present

## 2014-11-27 DIAGNOSIS — Z96642 Presence of left artificial hip joint: Secondary | ICD-10-CM | POA: Diagnosis present

## 2014-11-27 DIAGNOSIS — Z888 Allergy status to other drugs, medicaments and biological substances status: Secondary | ICD-10-CM

## 2014-11-27 DIAGNOSIS — Z7951 Long term (current) use of inhaled steroids: Secondary | ICD-10-CM

## 2014-11-27 DIAGNOSIS — M4310 Spondylolisthesis, site unspecified: Secondary | ICD-10-CM | POA: Diagnosis not present

## 2014-11-27 DIAGNOSIS — Z87891 Personal history of nicotine dependence: Secondary | ICD-10-CM

## 2014-11-27 DIAGNOSIS — Z96652 Presence of left artificial knee joint: Secondary | ICD-10-CM | POA: Diagnosis not present

## 2014-11-27 DIAGNOSIS — J45909 Unspecified asthma, uncomplicated: Secondary | ICD-10-CM | POA: Diagnosis present

## 2014-11-27 DIAGNOSIS — E7800 Pure hypercholesterolemia, unspecified: Secondary | ICD-10-CM | POA: Diagnosis not present

## 2014-11-27 DIAGNOSIS — E1149 Type 2 diabetes mellitus with other diabetic neurological complication: Secondary | ICD-10-CM | POA: Diagnosis not present

## 2014-11-27 DIAGNOSIS — Z7902 Long term (current) use of antithrombotics/antiplatelets: Secondary | ICD-10-CM | POA: Diagnosis not present

## 2014-11-27 DIAGNOSIS — I252 Old myocardial infarction: Secondary | ICD-10-CM

## 2014-11-27 DIAGNOSIS — Z886 Allergy status to analgesic agent status: Secondary | ICD-10-CM | POA: Diagnosis not present

## 2014-11-27 DIAGNOSIS — Z95818 Presence of other cardiac implants and grafts: Secondary | ICD-10-CM

## 2014-11-27 DIAGNOSIS — Z96651 Presence of right artificial knee joint: Secondary | ICD-10-CM | POA: Diagnosis not present

## 2014-11-27 DIAGNOSIS — G8918 Other acute postprocedural pain: Secondary | ICD-10-CM | POA: Diagnosis not present

## 2014-11-27 DIAGNOSIS — I480 Paroxysmal atrial fibrillation: Secondary | ICD-10-CM | POA: Diagnosis not present

## 2014-11-27 DIAGNOSIS — M179 Osteoarthritis of knee, unspecified: Secondary | ICD-10-CM | POA: Diagnosis not present

## 2014-11-27 DIAGNOSIS — K5790 Diverticulosis of intestine, part unspecified, without perforation or abscess without bleeding: Secondary | ICD-10-CM | POA: Diagnosis not present

## 2014-11-27 DIAGNOSIS — Z91041 Radiographic dye allergy status: Secondary | ICD-10-CM

## 2014-11-27 DIAGNOSIS — M1712 Unilateral primary osteoarthritis, left knee: Principal | ICD-10-CM | POA: Diagnosis present

## 2014-11-27 DIAGNOSIS — G43109 Migraine with aura, not intractable, without status migrainosus: Secondary | ICD-10-CM | POA: Diagnosis present

## 2014-11-27 DIAGNOSIS — G4730 Sleep apnea, unspecified: Secondary | ICD-10-CM | POA: Diagnosis present

## 2014-11-27 DIAGNOSIS — Z853 Personal history of malignant neoplasm of breast: Secondary | ICD-10-CM | POA: Diagnosis not present

## 2014-11-27 DIAGNOSIS — K648 Other hemorrhoids: Secondary | ICD-10-CM | POA: Diagnosis not present

## 2014-11-27 DIAGNOSIS — M1990 Unspecified osteoarthritis, unspecified site: Secondary | ICD-10-CM | POA: Diagnosis not present

## 2014-11-27 DIAGNOSIS — G43909 Migraine, unspecified, not intractable, without status migrainosus: Secondary | ICD-10-CM | POA: Diagnosis not present

## 2014-11-27 DIAGNOSIS — K449 Diaphragmatic hernia without obstruction or gangrene: Secondary | ICD-10-CM | POA: Diagnosis not present

## 2014-11-27 HISTORY — PX: TOTAL KNEE ARTHROPLASTY: SHX125

## 2014-11-27 HISTORY — PX: KNEE ARTHROPLASTY: SHX992

## 2014-11-27 LAB — CBC
HCT: 34.6 % — ABNORMAL LOW (ref 36.0–46.0)
HEMOGLOBIN: 10.7 g/dL — AB (ref 12.0–15.0)
MCH: 26.5 pg (ref 26.0–34.0)
MCHC: 30.9 g/dL (ref 30.0–36.0)
MCV: 85.6 fL (ref 78.0–100.0)
Platelets: 311 10*3/uL (ref 150–400)
RBC: 4.04 MIL/uL (ref 3.87–5.11)
RDW: 16.2 % — ABNORMAL HIGH (ref 11.5–15.5)
WBC: 9.8 10*3/uL (ref 4.0–10.5)

## 2014-11-27 LAB — GLUCOSE, CAPILLARY
GLUCOSE-CAPILLARY: 100 mg/dL — AB (ref 65–99)
GLUCOSE-CAPILLARY: 89 mg/dL (ref 65–99)
GLUCOSE-CAPILLARY: 210 mg/dL — AB (ref 65–99)
Glucose-Capillary: 140 mg/dL — ABNORMAL HIGH (ref 65–99)

## 2014-11-27 LAB — DIFFERENTIAL
Basophils Absolute: 0 10*3/uL (ref 0.0–0.1)
Basophils Relative: 0 %
EOS ABS: 0.1 10*3/uL (ref 0.0–0.7)
EOS PCT: 1 %
Lymphocytes Relative: 40 %
Lymphs Abs: 3.9 10*3/uL (ref 0.7–4.0)
MONOS PCT: 5 %
Monocytes Absolute: 0.5 10*3/uL (ref 0.1–1.0)
NEUTROS PCT: 54 %
Neutro Abs: 5.2 10*3/uL (ref 1.7–7.7)

## 2014-11-27 SURGERY — ARTHROPLASTY, KNEE, TOTAL, USING IMAGELESS COMPUTER-ASSISTED NAVIGATION
Anesthesia: Monitor Anesthesia Care | Site: Knee | Laterality: Left

## 2014-11-27 MED ORDER — ACETAMINOPHEN 325 MG PO TABS: 650.0000 mg | ORAL_TABLET | Freq: Four times a day (QID) | ORAL | Status: DC | PRN

## 2014-11-27 MED ORDER — MIDAZOLAM HCL 2 MG/2ML IJ SOLN
INTRAMUSCULAR | Status: AC
  Administered 2014-11-27: 2 mg via INTRAVENOUS
  Filled 2014-11-27: qty 2

## 2014-11-27 MED ORDER — CLOPIDOGREL BISULFATE 75 MG PO TABS
75.0000 mg | ORAL_TABLET | Freq: Every day | ORAL | Status: DC
  Administered 2014-11-28 – 2014-11-29 (×2): 75 mg via ORAL
  Filled 2014-11-27 (×2): qty 1

## 2014-11-27 MED ORDER — PROPOFOL 500 MG/50ML IV EMUL
INTRAVENOUS | Status: DC | PRN
  Administered 2014-11-27: 50 ug/kg/min via INTRAVENOUS

## 2014-11-27 MED ORDER — METOCLOPRAMIDE HCL 5 MG PO TABS
5.0000 mg | ORAL_TABLET | Freq: Three times a day (TID) | ORAL | Status: DC | PRN
  Filled 2014-11-27 (×3): qty 1

## 2014-11-27 MED ORDER — LACTATED RINGERS IV SOLN
INTRAVENOUS | Status: DC
  Administered 2014-11-27 (×3): via INTRAVENOUS

## 2014-11-27 MED ORDER — PANTOPRAZOLE SODIUM 40 MG PO TBEC
40.0000 mg | DELAYED_RELEASE_TABLET | Freq: Every day | ORAL | Status: DC
  Administered 2014-11-28 – 2014-11-29 (×2): 40 mg via ORAL
  Filled 2014-11-27 (×2): qty 1

## 2014-11-27 MED ORDER — METOCLOPRAMIDE HCL 10 MG PO TABS
10.0000 mg | ORAL_TABLET | Freq: Three times a day (TID) | ORAL | Status: DC
  Administered 2014-11-27 – 2014-11-29 (×7): 10 mg via ORAL
  Filled 2014-11-27 (×3): qty 1

## 2014-11-27 MED ORDER — HYDROMORPHONE HCL 1 MG/ML IJ SOLN
INTRAMUSCULAR | Status: AC
  Administered 2014-11-27: 0.5 mg via INTRAVENOUS
  Filled 2014-11-27: qty 1

## 2014-11-27 MED ORDER — LOSARTAN POTASSIUM 50 MG PO TABS
100.0000 mg | ORAL_TABLET | Freq: Every day | ORAL | Status: DC
  Administered 2014-11-28 – 2014-11-29 (×2): 100 mg via ORAL
  Filled 2014-11-27 (×2): qty 2

## 2014-11-27 MED ORDER — MENTHOL 3 MG MT LOZG: 1.0000 | LOZENGE | OROMUCOSAL | Status: DC | PRN

## 2014-11-27 MED ORDER — DILTIAZEM HCL ER COATED BEADS 180 MG PO CP24
180.0000 mg | ORAL_CAPSULE | Freq: Every day | ORAL | Status: DC
  Administered 2014-11-29: 180 mg via ORAL
  Filled 2014-11-27 (×2): qty 1

## 2014-11-27 MED ORDER — POTASSIUM CHLORIDE IN NACL 20-0.45 MEQ/L-% IV SOLN
INTRAVENOUS | Status: DC
  Administered 2014-11-27: 23:00:00 via INTRAVENOUS
  Filled 2014-11-27 (×6): qty 1000

## 2014-11-27 MED ORDER — SODIUM CHLORIDE 0.9 % IR SOLN
Status: DC | PRN
  Administered 2014-11-27: 3000 mL

## 2014-11-27 MED ORDER — AMITRIPTYLINE HCL 50 MG PO TABS: 50.0000 mg | ORAL_TABLET | Freq: Every evening | ORAL | Status: DC | PRN

## 2014-11-27 MED ORDER — NOREPINEPHRINE BITARTRATE 1 MG/ML IV SOLN: 4000.0000 ug | INTRAVENOUS | Status: DC | PRN

## 2014-11-27 MED ORDER — FENTANYL CITRATE (PF) 100 MCG/2ML IJ SOLN
INTRAMUSCULAR | Status: AC
  Administered 2014-11-27: 100 ug via INTRAVENOUS
  Filled 2014-11-27: qty 2

## 2014-11-27 MED ORDER — ALBUTEROL SULFATE (2.5 MG/3ML) 0.083% IN NEBU: 3.0000 mL | INHALATION_SOLUTION | Freq: Four times a day (QID) | RESPIRATORY_TRACT | Status: DC | PRN

## 2014-11-27 MED ORDER — MOMETASONE FURO-FORMOTEROL FUM 100-5 MCG/ACT IN AERO
2.0000 | INHALATION_SPRAY | Freq: Two times a day (BID) | RESPIRATORY_TRACT | Status: DC
  Administered 2014-11-27 – 2014-11-29 (×5): 2 via RESPIRATORY_TRACT
  Filled 2014-11-27: qty 8.8

## 2014-11-27 MED ORDER — METFORMIN HCL ER 500 MG PO TB24
1000.0000 mg | ORAL_TABLET | Freq: Every day | ORAL | Status: DC
  Administered 2014-11-27 – 2014-11-29 (×3): 1000 mg via ORAL
  Filled 2014-11-27 (×3): qty 2

## 2014-11-27 MED ORDER — SAXAGLIPTIN-METFORMIN ER 5-1000 MG PO TB24: ORAL_TABLET | Freq: Every day | ORAL | Status: DC

## 2014-11-27 MED ORDER — TOPIRAMATE 100 MG PO TABS
100.0000 mg | ORAL_TABLET | Freq: Every evening | ORAL | Status: DC
  Administered 2014-11-27 – 2014-11-29 (×3): 100 mg via ORAL
  Filled 2014-11-27 (×3): qty 1

## 2014-11-27 MED ORDER — ATORVASTATIN CALCIUM 40 MG PO TABS
40.0000 mg | ORAL_TABLET | Freq: Every day | ORAL | Status: DC
  Administered 2014-11-27 – 2014-11-29 (×3): 40 mg via ORAL
  Filled 2014-11-27 (×3): qty 1

## 2014-11-27 MED ORDER — ONDANSETRON HCL 4 MG/2ML IJ SOLN
INTRAMUSCULAR | Status: AC
  Filled 2014-11-27: qty 2

## 2014-11-27 MED ORDER — CHLORHEXIDINE GLUCONATE 4 % EX LIQD: 60.0000 mL | Freq: Once | CUTANEOUS | Status: DC

## 2014-11-27 MED ORDER — CEFAZOLIN SODIUM-DEXTROSE 2-3 GM-% IV SOLR
2.0000 g | INTRAVENOUS | Status: AC
  Administered 2014-11-27: 2 g via INTRAVENOUS
  Filled 2014-11-27: qty 50

## 2014-11-27 MED ORDER — HYDROMORPHONE HCL 1 MG/ML IJ SOLN
0.5000 mg | INTRAMUSCULAR | Status: DC | PRN
  Administered 2014-11-28: 0.5 mg via INTRAVENOUS
  Filled 2014-11-27: qty 1

## 2014-11-27 MED ORDER — ASPIRIN EC 325 MG PO TBEC
325.0000 mg | DELAYED_RELEASE_TABLET | Freq: Every day | ORAL | Status: DC
  Filled 2014-11-27: qty 1

## 2014-11-27 MED ORDER — METOCLOPRAMIDE HCL 5 MG/ML IJ SOLN: 5.0000 mg | Freq: Three times a day (TID) | INTRAMUSCULAR | Status: DC | PRN

## 2014-11-27 MED ORDER — LIDOCAINE HCL (CARDIAC) 20 MG/ML IV SOLN
INTRAVENOUS | Status: AC
  Filled 2014-11-27: qty 5

## 2014-11-27 MED ORDER — METHOCARBAMOL 500 MG PO TABS
500.0000 mg | ORAL_TABLET | Freq: Four times a day (QID) | ORAL | Status: DC | PRN
  Administered 2014-11-27 – 2014-11-29 (×5): 500 mg via ORAL
  Filled 2014-11-27 (×5): qty 1

## 2014-11-27 MED ORDER — CEFAZOLIN SODIUM 1-5 GM-% IV SOLN
1.0000 g | Freq: Three times a day (TID) | INTRAVENOUS | Status: AC
  Administered 2014-11-27 – 2014-11-28 (×2): 1 g via INTRAVENOUS
  Filled 2014-11-27 (×3): qty 50

## 2014-11-27 MED ORDER — FENTANYL CITRATE (PF) 100 MCG/2ML IJ SOLN: INTRAMUSCULAR | Status: DC | PRN

## 2014-11-27 MED ORDER — ONDANSETRON HCL 4 MG/2ML IJ SOLN: 4.0000 mg | Freq: Four times a day (QID) | INTRAMUSCULAR | Status: DC | PRN

## 2014-11-27 MED ORDER — METHOCARBAMOL 1000 MG/10ML IJ SOLN
500.0000 mg | Freq: Four times a day (QID) | INTRAVENOUS | Status: DC | PRN
  Filled 2014-11-27: qty 5

## 2014-11-27 MED ORDER — GABAPENTIN 300 MG PO CAPS
300.0000 mg | ORAL_CAPSULE | Freq: Every day | ORAL | Status: DC
  Administered 2014-11-27 – 2014-11-28 (×2): 300 mg via ORAL
  Filled 2014-11-27 (×2): qty 1

## 2014-11-27 MED ORDER — ACETAMINOPHEN 650 MG RE SUPP: 650.0000 mg | Freq: Four times a day (QID) | RECTAL | Status: DC | PRN

## 2014-11-27 MED ORDER — ONDANSETRON HCL 4 MG PO TABS: 4.0000 mg | ORAL_TABLET | Freq: Four times a day (QID) | ORAL | Status: DC | PRN

## 2014-11-27 MED ORDER — PROPOFOL 10 MG/ML IV BOLUS
INTRAVENOUS | Status: AC
  Filled 2014-11-27: qty 20

## 2014-11-27 MED ORDER — PHENYLEPHRINE 40 MCG/ML (10ML) SYRINGE FOR IV PUSH (FOR BLOOD PRESSURE SUPPORT)
PREFILLED_SYRINGE | INTRAVENOUS | Status: AC
  Filled 2014-11-27: qty 10

## 2014-11-27 MED ORDER — HYOSCYAMINE SULFATE 0.125 MG SL SUBL
0.1250 mg | SUBLINGUAL_TABLET | SUBLINGUAL | Status: DC | PRN
  Filled 2014-11-27: qty 1

## 2014-11-27 MED ORDER — TOPIRAMATE 25 MG PO TABS
25.0000 mg | ORAL_TABLET | Freq: Every morning | ORAL | Status: DC
  Administered 2014-11-28 – 2014-11-29 (×2): 25 mg via ORAL
  Filled 2014-11-27 (×2): qty 1

## 2014-11-27 MED ORDER — HYDROMORPHONE HCL 1 MG/ML IJ SOLN
0.2500 mg | INTRAMUSCULAR | Status: DC | PRN
  Administered 2014-11-27 (×3): 0.5 mg via INTRAVENOUS

## 2014-11-27 MED ORDER — FENTANYL CITRATE (PF) 250 MCG/5ML IJ SOLN
INTRAMUSCULAR | Status: AC
  Filled 2014-11-27: qty 5

## 2014-11-27 MED ORDER — CYCLOSPORINE 0.05 % OP EMUL
1.0000 [drp] | Freq: Two times a day (BID) | OPHTHALMIC | Status: DC
  Administered 2014-11-27 – 2014-11-29 (×4): 1 [drp] via OPHTHALMIC
  Filled 2014-11-27 (×5): qty 1

## 2014-11-27 MED ORDER — DOCUSATE SODIUM 100 MG PO CAPS
100.0000 mg | ORAL_CAPSULE | Freq: Two times a day (BID) | ORAL | Status: DC
  Administered 2014-11-27 – 2014-11-29 (×4): 100 mg via ORAL
  Filled 2014-11-27 (×4): qty 1

## 2014-11-27 MED ORDER — ISOSORBIDE MONONITRATE ER 60 MG PO TB24
60.0000 mg | ORAL_TABLET | Freq: Every day | ORAL | Status: DC
  Administered 2014-11-28 – 2014-11-29 (×2): 60 mg via ORAL
  Filled 2014-11-27 (×2): qty 1

## 2014-11-27 MED ORDER — OLOPATADINE HCL 0.1 % OP SOLN
1.0000 [drp] | Freq: Two times a day (BID) | OPHTHALMIC | Status: DC
  Administered 2014-11-27 – 2014-11-29 (×4): 1 [drp] via OPHTHALMIC
  Filled 2014-11-27: qty 5

## 2014-11-27 MED ORDER — NITROGLYCERIN 0.4 MG/SPRAY TL SOLN
1.0000 | Status: DC | PRN
  Filled 2014-11-27: qty 4.9

## 2014-11-27 MED ORDER — IPRATROPIUM BROMIDE 0.06 % NA SOLN
2.0000 | Freq: Four times a day (QID) | NASAL | Status: DC
  Administered 2014-11-27 – 2014-11-29 (×8): 2 via NASAL
  Filled 2014-11-27: qty 15

## 2014-11-27 MED ORDER — MIDAZOLAM HCL 5 MG/5ML IJ SOLN: INTRAMUSCULAR | Status: DC | PRN

## 2014-11-27 MED ORDER — PROMETHAZINE HCL 25 MG/ML IJ SOLN: 6.2500 mg | INTRAMUSCULAR | Status: DC | PRN

## 2014-11-27 MED ORDER — ROCURONIUM BROMIDE 50 MG/5ML IV SOLN
INTRAVENOUS | Status: AC
  Filled 2014-11-27: qty 1

## 2014-11-27 MED ORDER — 0.9 % SODIUM CHLORIDE (POUR BTL) OPTIME
TOPICAL | Status: DC | PRN
  Administered 2014-11-27: 1000 mL

## 2014-11-27 MED ORDER — OXYCODONE HCL 5 MG PO TABS
5.0000 mg | ORAL_TABLET | ORAL | Status: DC | PRN
  Administered 2014-11-27 – 2014-11-28 (×3): 10 mg via ORAL
  Administered 2014-11-28: 5 mg via ORAL
  Administered 2014-11-28 – 2014-11-29 (×5): 10 mg via ORAL
  Filled 2014-11-27 (×9): qty 2

## 2014-11-27 MED ORDER — ROPIVACAINE HCL 5 MG/ML IJ SOLN
INTRAMUSCULAR | Status: DC | PRN
  Administered 2014-11-27: 20 mL via PERINEURAL

## 2014-11-27 MED ORDER — BUPIVACAINE LIPOSOME 1.3 % IJ SUSP
INTRAMUSCULAR | Status: DC | PRN
  Administered 2014-11-27: 15 mL

## 2014-11-27 MED ORDER — ONDANSETRON HCL 4 MG/2ML IJ SOLN
INTRAMUSCULAR | Status: DC | PRN
  Administered 2014-11-27: 4 mg via INTRAVENOUS

## 2014-11-27 MED ORDER — LINAGLIPTIN 5 MG PO TABS
5.0000 mg | ORAL_TABLET | Freq: Every day | ORAL | Status: DC
  Administered 2014-11-27 – 2014-11-29 (×3): 5 mg via ORAL
  Filled 2014-11-27 (×3): qty 1

## 2014-11-27 MED ORDER — PHENOL 1.4 % MT LIQD
1.0000 | OROMUCOSAL | Status: DC | PRN
Start: 2014-11-27 — End: 2014-11-29

## 2014-11-27 MED ORDER — BUPIVACAINE LIPOSOME 1.3 % IJ SUSP
20.0000 mL | INTRAMUSCULAR | Status: DC
  Filled 2014-11-27: qty 20

## 2014-11-27 MED ORDER — POLYETHYLENE GLYCOL 3350 17 G PO PACK: 17.0000 g | PACK | Freq: Every day | ORAL | Status: DC | PRN

## 2014-11-27 MED ORDER — SUCCINYLCHOLINE CHLORIDE 20 MG/ML IJ SOLN
INTRAMUSCULAR | Status: AC
  Filled 2014-11-27: qty 1

## 2014-11-27 MED ORDER — DEXTROSE 5 % IV SOLN
10.0000 mg | INTRAVENOUS | Status: DC | PRN
  Administered 2014-11-27: 20 ug/min via INTRAVENOUS

## 2014-11-27 SURGICAL SUPPLY — 65 items
BANDAGE ELASTIC 4 VELCRO ST LF (GAUZE/BANDAGES/DRESSINGS) ×3 IMPLANT
BENZOIN TINCTURE PRP APPL 2/3 (GAUZE/BANDAGES/DRESSINGS) ×3 IMPLANT
BLADE SAGITTAL 25.0X1.19X90 (BLADE) ×2 IMPLANT
BLADE SAGITTAL 25.0X1.19X90MM (BLADE) ×1
BLADE SAW SGTL 13X75X1.27 (BLADE) ×3 IMPLANT
BNDG ELASTIC 6X10 VLCR STRL LF (GAUZE/BANDAGES/DRESSINGS) ×3 IMPLANT
BOWL SMART MIX CTS (DISPOSABLE) ×3 IMPLANT
CAP KNEE TOTAL 3 SIGMA ×3 IMPLANT
CEMENT HV SMART SET (Cement) ×6 IMPLANT
CLOSURE STERI-STRIP 1/2X4 (GAUZE/BANDAGES/DRESSINGS) ×2
CLSR STERI-STRIP ANTIMIC 1/2X4 (GAUZE/BANDAGES/DRESSINGS) ×4 IMPLANT
COVER SURGICAL LIGHT HANDLE (MISCELLANEOUS) ×3 IMPLANT
CUFF TOURNIQUET SINGLE 34IN LL (TOURNIQUET CUFF) ×3 IMPLANT
DRAPE ORTHO SPLIT 77X108 STRL (DRAPES) ×4
DRAPE SURG ORHT 6 SPLT 77X108 (DRAPES) ×2 IMPLANT
DRAPE U-SHAPE 47X51 STRL (DRAPES) ×3 IMPLANT
DRSG PAD ABDOMINAL 8X10 ST (GAUZE/BANDAGES/DRESSINGS) ×6 IMPLANT
DURAPREP 26ML APPLICATOR (WOUND CARE) ×3 IMPLANT
ELECT REM PT RETURN 9FT ADLT (ELECTROSURGICAL) ×3
ELECTRODE REM PT RTRN 9FT ADLT (ELECTROSURGICAL) ×1 IMPLANT
FACESHIELD WRAPAROUND (MASK) ×6 IMPLANT
GAUZE SPONGE 4X4 12PLY STRL (GAUZE/BANDAGES/DRESSINGS) ×6 IMPLANT
GAUZE XEROFORM 1X8 LF (GAUZE/BANDAGES/DRESSINGS) ×3 IMPLANT
GAUZE XEROFORM 5X9 LF (GAUZE/BANDAGES/DRESSINGS) IMPLANT
GLOVE BIOGEL PI IND STRL 8 (GLOVE) ×2 IMPLANT
GLOVE BIOGEL PI INDICATOR 8 (GLOVE) ×4
GLOVE ORTHO TXT STRL SZ7.5 (GLOVE) ×9 IMPLANT
GOWN STRL REUS W/ TWL LRG LVL3 (GOWN DISPOSABLE) ×1 IMPLANT
GOWN STRL REUS W/ TWL XL LVL3 (GOWN DISPOSABLE) ×1 IMPLANT
GOWN STRL REUS W/TWL 2XL LVL3 (GOWN DISPOSABLE) ×3 IMPLANT
GOWN STRL REUS W/TWL LRG LVL3 (GOWN DISPOSABLE) ×2
GOWN STRL REUS W/TWL XL LVL3 (GOWN DISPOSABLE) ×2
HANDPIECE INTERPULSE COAX TIP (DISPOSABLE) ×2
IMMOBILIZER KNEE 22 UNIV (SOFTGOODS) IMPLANT
KIT BASIN OR (CUSTOM PROCEDURE TRAY) ×3 IMPLANT
KIT ROOM TURNOVER OR (KITS) ×3 IMPLANT
MANIFOLD NEPTUNE II (INSTRUMENTS) ×3 IMPLANT
MARKER SPHERE PSV REFLC THRD 5 (MARKER) ×3 IMPLANT
NEEDLE 22X1 1/2 (OR ONLY) (NEEDLE) ×3 IMPLANT
NEEDLE HYPO 25GX1X1/2 BEV (NEEDLE) ×3 IMPLANT
NS IRRIG 1000ML POUR BTL (IV SOLUTION) ×3 IMPLANT
PACK TOTAL JOINT (CUSTOM PROCEDURE TRAY) ×3 IMPLANT
PACK UNIVERSAL I (CUSTOM PROCEDURE TRAY) ×3 IMPLANT
PAD ARMBOARD 7.5X6 YLW CONV (MISCELLANEOUS) ×3 IMPLANT
PAD CAST 4YDX4 CTTN HI CHSV (CAST SUPPLIES) ×1 IMPLANT
PADDING CAST COTTON 4X4 STRL (CAST SUPPLIES) ×2
PADDING CAST COTTON 6X4 STRL (CAST SUPPLIES) ×3 IMPLANT
PIN SCHANZ 4MM 130MM (PIN) IMPLANT
SET HNDPC FAN SPRY TIP SCT (DISPOSABLE) ×1 IMPLANT
STAPLER VISISTAT 35W (STAPLE) IMPLANT
SUCTION FRAZIER TIP 10 FR DISP (SUCTIONS) ×3 IMPLANT
SUT ETHILON 3 0 PS 1 (SUTURE) ×3 IMPLANT
SUT VIC AB 0 CT1 27 (SUTURE) ×4
SUT VIC AB 0 CT1 27XBRD ANBCTR (SUTURE) ×2 IMPLANT
SUT VIC AB 1 CTX 36 (SUTURE) ×2
SUT VIC AB 1 CTX36XBRD ANBCTR (SUTURE) ×1 IMPLANT
SUT VIC AB 2-0 CT1 27 (SUTURE) ×2
SUT VIC AB 2-0 CT1 TAPERPNT 27 (SUTURE) ×1 IMPLANT
SUT VIC AB 3-0 X1 27 (SUTURE) ×3 IMPLANT
SYR 20CC LL (SYRINGE) ×3 IMPLANT
SYR CONTROL 10ML LL (SYRINGE) ×3 IMPLANT
TOWEL OR 17X24 6PK STRL BLUE (TOWEL DISPOSABLE) ×3 IMPLANT
TOWEL OR 17X26 10 PK STRL BLUE (TOWEL DISPOSABLE) ×3 IMPLANT
WATER STERILE IRR 1000ML POUR (IV SOLUTION) ×3 IMPLANT
YANKAUER SUCT BULB TIP NO VENT (SUCTIONS) ×3 IMPLANT

## 2014-11-27 NOTE — Anesthesia Preprocedure Evaluation (Addendum)
Anesthesia Evaluation  Patient identified by MRN, date of birth, ID band Patient awake    Reviewed: Allergy & Precautions, NPO status , Patient's Chart, lab work & pertinent test results, reviewed documented beta blocker date and time   Airway Mallampati: II  TM Distance: >3 FB Neck ROM: Full    Dental  (+) Dental Advisory Given, Edentulous Upper, Edentulous Lower   Pulmonary shortness of breath, asthma , sleep apnea and Oxygen sleep apnea , former smoker,    Pulmonary exam normal        Cardiovascular hypertension, + Past MI  Normal cardiovascular exam+ dysrhythmias   Nuclear stress test 12/22/13:  1. No reversible ischemia or infarction. 2. Normal left ventricular wall motion. 3. Left ventricular ejection fraction 42% 4. Low-risk stress test findings    Neuro/Psych  Headaches, PSYCHIATRIC DISORDERS Depression  Neuromuscular disease    GI/Hepatic Neg liver ROS, hiatal hernia, GERD  Medicated and Controlled,  Endo/Other  diabetes, Well Controlled, Type 2, Oral Hypoglycemic AgentsMorbid obesity  Renal/GU negative Renal ROS     Musculoskeletal  (+) Arthritis ,   Abdominal   Peds  Hematology  (+) anemia ,   Anesthesia Other Findings   Reproductive/Obstetrics                          Anesthesia Physical Anesthesia Plan  ASA: III  Anesthesia Plan: MAC and Spinal   Post-op Pain Management:    Induction: Intravenous  Airway Management Planned: Simple Face Mask  Additional Equipment:   Intra-op Plan:   Post-operative Plan:   Informed Consent: I have reviewed the patients History and Physical, chart, labs and discussed the procedure including the risks, benefits and alternatives for the proposed anesthesia with the patient or authorized representative who has indicated his/her understanding and acceptance.   Dental advisory given  Plan Discussed with: CRNA, Anesthesiologist and  Surgeon  Anesthesia Plan Comments:         Anesthesia Quick Evaluation

## 2014-11-27 NOTE — Progress Notes (Signed)
Orthopedic Tech Progress Note Patient Details:  [name] 09-22-1945 [id number] Replacement knee immobilizer, first one was soild. Ortho Devices Type of Ortho Device: Knee Immobilizer Ortho Device/Splint Location: LLE Ortho Device/Splint Interventions: Ordered, Application   Braulio Bosch 11/27/2014, 7:24 PM

## 2014-11-27 NOTE — H&P (Signed)
TOTAL KNEE ADMISSION H&P  Patient is being admitted for left total knee arthroplasty.  Subjective:  Chief Complaint:left knee pain.  HPI: [name], 69 y.o. female, has a history of pain and functional disability in the left knee due to arthritis and has failed non-surgical conservative treatments for greater than 12 weeks to includeNSAID's and/or analgesics, corticosteriod injections, use of assistive devices and activity modification.  Onset of symptoms was gradual, starting 10 years ago with gradually worsening course since that time.   Patient currently rates pain in the left knee(s) at 10 out of 10 with activity. Patient has night pain, pain that interferes with activities of daily living, pain with passive range of motion, crepitus and joint swelling.  Patient has evidence of subchondral cysts, subchondral sclerosis, periarticular osteophytes and joint space narrowing by imaging studies. T There is no active infection.  Patient Active Problem List   Diagnosis Date Noted  . Insomnia due to medical condition 10/15/2014  . DDD (degenerative disc disease), lumbar 10/15/2014  . Intractable persistent migraine aura without cerebral infarction and with status migrainosus 10/15/2014  . Arthritis, senescent 10/15/2014  . Preventative health care 08/13/2014  . Periumbilical abdominal pain 06/28/2014  . History of colonic polyps 06/28/2014  . Asthma 05/17/2014  . Obstructive sleep apnea   . Other type of migraine   . DM (diabetes mellitus) type II controlled, neurological manifestation ([place])   . Dyslipidemia 11/12/2008  . Essential hypertension, benign 11/12/2008  . SYNCOPE 11/12/2008   Past Medical History  Diagnosis Date  . Myocardial infarction [facility]) [place] Multi W/Spect W/Wall Motion EF on 08/07/10 - IMPRESSION: 1) No evidence of ischemia.  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  3) Normal LV wall motion.  4) Estimated Q  G S EF 70%, unchanged.  . Asthma   . Blood transfusion   . GERD (gastroesophageal reflux disease)   . Hiatal hernia   . Other forms of migraine, without mention of intractable migraine without mention of status migrainosus   . Dry eye     Chronic  . Hypercholesteremia   . Spondylolisthesis   . Diverticulosis of colon (without mention of hemorrhage)     Colonoscopy 11/09/09 - FINDINGS: 1) 58mm polp in transverse colon, resected & retrieved.  2) Diverticulosis in sigmoid & descending colon.  3) Medium-sized, internal hemorrhoids.  . Internal hemorrhoids without mention of complication     Colonoscopy 11/09/09 - FINDINGS: 1) 56mm polp in transverse colon, resected & retrieved.  2) Diverticulosis in sigmoid & descending colon.  3) Medium-sized, internal hemorrhoids.  . Benign neoplasm of colon 11/09/09    Colonoscopy 11/09/09 - FINDINGS: 1) 19mm polp in transverse colon, resected & retrieved.  2) Diverticulosis in sigmoid & descending colon.  3) Medium-sized, internal hemorrhoids.  . Hemorrhage of rectum and anus     Colonoscopy 11/09/09 - FINDINGS: 1) 46mm polp in transverse colon, resected & retrieved.  2) Diverticulosis in sigmoid & descending colon.  3) Medium-sized, internal hemorrhoids.  . Essential hypertension, benign   . Esophagitis   . CMC arthritis     Left middle finger  . Depression     40+ years  . Diabetes mellitus 10/2008    type 2  . Breast cancer ([place]) 1989    Left breast cancer  . Obese   . Syncope     MDT LinQ implanted for evaluation  . Facet arthropathy, lumbar  L3-4  . Obstructive sleep apnea     uses oxygen at night  . Allergy   . Anemia   . Arthritis   . Blood transfusion without reported diagnosis   . Cataract   . Glaucoma   . Oxygen deficiency     .2  . Dysrhythmia   . Anginal pain ([place])   . Shortness of breath dyspnea   . Chronic kidney disease     states she has a cyst on her kidney    Past Surgical History  Procedure Laterality Date  .  Abdominal hysterectomy  1978  . Joint replacement      lsft hip x2, rt knee  . Knee arthroscopy Right 2002    By Dr. Lorin Mercy  . Foot surgery Bilateral     Bil feet great toes and second toes spurs. By Dr. Milinda Pointer in 2004.  . Breast biopsy Left 1995    Biopsy at [facility] ([facility])  . Tonsillectomy    . Back surgery      x2 one for infection  . Cholecystectomy    . Total hip revision  12/14/2010    Procedure: TOTAL HIP REVISION;  Surgeon: Marybelle Killings;  Location: [place];  Service: Orthopedics;  Laterality: Left;  Left Total Hip Arthroplasty Revision, Poly and Ball Exchange, Possible Acetabular Revision vs. Poly Exchange  . Colonoscopy w/ polypectomy  11/09/09    FINDINGS: 1) 57mm polp in transverse colon, resected & retrieved.  2) Diverticulosis in sigmoid & descending colon.  3) Medium-sized, internal hemorrhoids.  . Loop recorder implant  11/15/06; 04/02/2013    MDT Reveal implanted 10/2006 for syncope; removed with MDT LinQ implanted 03/2013 by Dr Lovena Le  . Hemorrhoid surgery  2011    Internal & external hemorrhoidectomy (x) 3 quadrants  . Esophagogastroduodenoscopy  02/15/07  . Dilation and curettage of uterus    . Trigger finger release Left 01/02/2013    Procedure: RELEASE LEFT MIDDLE TRIGGER FINGER/LEFT INDEX AND LEFT RING FINGER;  Surgeon: Wynonia Sours, MD;  Location: [place];  Service: Orthopedics;  Laterality: Left;  ANESTHESIA: GENERAL/IV REGIONAL FAB  . Loop recorder implant N/A 04/02/2013    Procedure: LOOP RECORDER IMPLANT;  Surgeon: Evans Lance, MD;  Location: [facility] CATH LAB;  Service: Cardiovascular;  Laterality: N/A;  . Toe surgery    . Colonoscopy      Prescriptions prior to admission  Medication Sig Dispense Refill Last Dose  . acetaminophen (TYLENOL) 325 MG tablet Take 2 tablets (650 mg total) by mouth every 4 (four) hours as needed for headache or mild pain.   Taking  . amitriptyline (ELAVIL) 50 MG tablet Take one table by mouth every  night at bedtime. 90 tablet 3 Taking  . atorvastatin (LIPITOR) 40 MG tablet Take 40 mg by mouth daily.    Taking  . cholecalciferol (VITAMIN D) 1000 UNITS tablet Take 1,000 Units by mouth daily.   Taking  . clopidogrel (PLAVIX) 75 MG tablet Take 1 tablet (75 mg total) by mouth daily. 90 tablet 3 Taking  . cycloSPORINE (RESTASIS) 0.05 % ophthalmic emulsion Place 1 drop into both eyes 2 (two) times daily.    Taking  . DEXILANT 60 MG capsule TAKE 1 CAPSULE BY MOUTH ONCE DAILY 90 capsule 3 Taking  . diazepam (VALIUM) 5 MG tablet TAKE 1 TABLET BY MOUTH TWICE DAILY FOR MUSCLE SPASMS 60 tablet 1 Taking  . diclofenac sodium (VOLTAREN) 1 % GEL Apply 1 application topically daily as  needed (pain).    Taking  . diltiazem (CARDIZEM CD) 180 MG 24 hr capsule TAKE 1 CAPSULE BY MOUTH ONCE DAILY 90 capsule 3 Taking  . diphenhydrAMINE (BENADRYL) 25 mg capsule Take 25 mg by mouth at bedtime.     Marland Kitchen FIBER SELECT GUMMIES PO Take 2 capsules by mouth daily.     . Fluocinolone Acetonide 0.01 % OIL Place 2 drops into both ears daily as needed. Does every two weeks   Taking  . fluticasone-salmeterol (ADVAIR HFA) 115-21 MCG/ACT inhaler Inhale 2 puffs into the lungs 2 (two) times daily. 1 Inhaler 12 Taking  . gabapentin (NEURONTIN) 300 MG capsule Take 2 capsules of 300 mg each at night by mouth between one and 2 hours prior to bedtime. You can take another 300 mg in daytime for pain. 270 capsule 3 Taking  . HYDROcodone-acetaminophen (NORCO) 10-325 MG per tablet Take 1 tablet by mouth every 6 (six) hours as needed for moderate pain or severe pain.    Taking  . hyoscyamine (LEVSIN SL) 0.125 MG SL tablet PLACE 2 TABLETS UNDER THE TONGUE EVERY 4 HOURS AS NEEDED. 30 tablet 1 Taking  . ipratropium (ATROVENT) 0.06 % nasal spray Place 2 sprays into both nostrils 4 (four) times daily. (Patient taking differently: Place 2 sprays into both nostrils 3 (three) times daily as needed. ) 15 mL 12 Taking  . isosorbide mononitrate (IMDUR) 60 MG  24 hr tablet Take 1 tablet (60 mg total) by mouth daily. 90 tablet 3 Taking  . losartan (COZAAR) 100 MG tablet Take 1 tablet (100 mg total) by mouth daily. 90 tablet 3 Taking  . metoCLOPramide (REGLAN) 10 MG tablet Take one tab before each meal and at bedtime (Patient taking differently: Take one tablet by mouth before each meal and at bedtime) 120 tablet 2 Taking  . nitroGLYCERIN (NITROLINGUAL) 0.4 MG/SPRAY spray Place 1 spray under the tongue every 5 (five) minutes as needed. Chest pains 12 g 0   . olopatadine (PATANOL) 0.1 % ophthalmic solution Insert 1 drop in both eyes twice daily  6 Taking  . OVER THE COUNTER MEDICATION Take 1 capsule by mouth daily. Hair skin and Nail   Taking  . PROAIR HFA 108 (90 BASE) MCG/ACT inhaler INHALE 2 PUFFS BY MOUTH EVERY 6 HOURS FOR WHEEZING AND SHORTNESS OF BREATH 1 Inhaler 0   . Saxagliptin-Metformin 05-998 MG TB24 Take by mouth. Take one tablet every evening with a meal.   Taking  . topiramate (TOPAMAX) 100 MG tablet TAKE 1 TABLET BY MOUTH EVERY EVENING 30 tablet 2   . topiramate (TOPAMAX) 25 MG tablet TAKE 1 TABLET BY MOUTH EVERY MORNING. 90 tablet 3 Taking   Allergies  Allergen Reactions  . Contrast Media [Iodinated Diagnostic Agents] Anaphylaxis  . Gadolinium Anaphylaxis       . Aspirin Nausea And Vomiting    Fast heart rate  . Iohexol Other (See Comments)    unknown  . Trazodone And Nefazodone     Dizziness,lightheadness, heart felt funny    Social History  Substance Use Topics  . Smoking status: Former Smoker    Types: Cigarettes    Quit date: 01/19/1983  . Smokeless tobacco: Never Used  . Alcohol Use: No    Family History  Problem Relation Age of Onset  . Pancreatic cancer Mother   . Heart attack Father   . Diabetes Son   . Arthritis Brother     Severe/Crippling  . Tuberculosis Brother   . Hypertension Brother  x 2  . Heart disease Sister   . Diabetes Brother   . Seizures Brother   . Autoimmune disease Brother     AIDS  .  Colon cancer Brother   . Lung cancer Brother     mets     Review of Systems  Constitutional: Negative.   HENT: Negative.   Respiratory: Negative.   Cardiovascular: Negative.   Gastrointestinal: Negative.   Genitourinary: Negative.   Musculoskeletal: Positive for joint pain.  Skin: Negative.   Neurological: Negative.   Psychiatric/Behavioral: Negative.     Objective:  Physical Exam  Constitutional: She is oriented to person, place, and time. No distress.  HENT:  Head: Atraumatic.  Eyes: EOM are normal.  Neck: Normal range of motion.  Cardiovascular: Normal rate.   Respiratory: No respiratory distress.  GI: She exhibits distension.  Musculoskeletal: She exhibits edema and tenderness.  Neurological: She is alert and oriented to person, place, and time.  Skin: Skin is warm and dry.  Psychiatric: She has a normal mood and affect.    Vital signs in last 24 hours: Temp:  [99.1 F (37.3 C)] 99.1 F (37.3 C) (11/09 1008) Pulse Rate:  [96] 96 (11/09 1008) Resp:  [16] 16 (11/09 1008) BP: (130)/(73) 130/73 mmHg (11/09 1008) SpO2:  [98 %] 98 % (11/09 1008) Weight:  [102.059 kg (225 lb)] 102.059 kg (225 lb) (11/09 1008)  Labs:   Estimated body mass index is 38.04 kg/(m^2) as calculated from the following:   Height as of 11/15/14: 5' 4.5" (1.638 m).   Weight as of this encounter: 102.059 kg (225 lb).   Imaging Review Plain radiographs demonstrate moderate degenerative joint disease of the left knee(s). The overall alignment ismild varus. The bone quality appears to be good for age and reported activity level.  Assessment/Plan:  End stage arthritis, left knee   The patient history, physical examination, clinical judgment of the provider and imaging studies are consistent with end stage degenerative joint disease of the left knee(s) and total knee arthroplasty is deemed medically necessary. The treatment options including medical management, injection therapy arthroscopy  and arthroplasty were discussed at length. The risks and benefits of total knee arthroplasty were presented and reviewed. The risks due to aseptic loosening, infection, stiffness, patella tracking problems, thromboembolic complications and other imponderables were discussed. The patient acknowledged the explanation, agreed to proceed with the plan and consent was signed. Patient is being admitted for inpatient treatment for surgery, pain control, PT, OT, prophylactic antibiotics, VTE prophylaxis, progressive ambulation and ADL's and discharge planning. The patient is planning to be discharged to skilled nursing facility

## 2014-11-27 NOTE — Telephone Encounter (Signed)
Per DPR, okay to leave detailed message on home phone answering machine.  LMOM that Dr. Lovena Le diagnosed atrial fibrillation from the patient's automatic LINQ transmission and that our office or the AF clinic would be calling to set patient up with appointment in the AF clinic.  Gave direct phone number for device clinic if patient or family have any questions in the interim.  Per Epic notes, patient is currently admitted for left total knee arthroplasty and will be started on VTE prophylaxis.  Made EP APP aware of AF and that patient is currently admitted.  Will plan to call patient next week when discharged to set up for AF clinic appointment.

## 2014-11-27 NOTE — Brief Op Note (Signed)
11/27/2014  3:39 PM  PATIENT:  [name]  69 y.o. female  PRE-OPERATIVE DIAGNOSIS:  Left Knee Osteoarthritis  POST-OPERATIVE DIAGNOSIS:  Left Knee Osteoarthritis  PROCEDURE:  Procedure(s): LEFT TOTAL KNEE ARTHROPLASTY (Left)  SURGEON:  Surgeon(s) and Role:    * Marybelle Killings, MD - Primary  PHYSICIAN ASSISTANT: Benjiman Core ANESTHESIA:   regional and epidural  EBL:  Total I/O In: 1400 [I.V.:1400] Out: -   BLOOD ADMINISTERED:none  DRAINS: none   LOCAL MEDICATIONS USED: exparel  SPECIMEN:  No Specimen  DISPOSITION OF SPECIMEN:  N/A  COUNTS:  YES  TOURNIQUET:   Total Tourniquet Time Documented: Thigh (Left) - 68 minutes Total: Thigh (Left) - 68 minutes   DICTATION: .Viviann Spare Dictation  PLAN OF CARE: Admit to inpatient   PATIENT DISPOSITION:  PACU - hemodynamically stable.

## 2014-11-27 NOTE — Anesthesia Postprocedure Evaluation (Signed)
Anesthesia Post Note  Patient: [name]  Procedure(s) Performed: Procedure(s) (LRB): LEFT TOTAL KNEE ARTHROPLASTY (Left)  Anesthesia type: MAC/SAB  Patient location: PACU  Post pain: Pain level controlled  Post assessment: Patient's Cardiovascular Status Stable, SAB receding  Last Vitals:  Filed Vitals:   11/27/14 1600  BP: 138/66  Pulse: 82  Temp:   Resp: 16    Post vital signs: Reviewed and stable  Level of consciousness: sedated  Complications: No apparent anesthesia complications

## 2014-11-27 NOTE — Anesthesia Procedure Notes (Addendum)
Spinal Patient location during procedure: OR Start time: 11/27/2014 12:37 PM End time: 11/27/2014 12:45 PM Staffing Anesthesiologist: Duane Boston Performed by: anesthesiologist  Preanesthetic Checklist Completed: patient identified, surgical consent, pre-op evaluation, timeout performed, IV checked, risks and benefits discussed and monitors and equipment checked Spinal Block Patient position: sitting Prep: DuraPrep Patient monitoring: cardiac monitor, continuous pulse ox and blood pressure Approach: midline Location: L2-3 Injection technique: single-shot Needle Needle type: Pencan  Needle gauge: 24 G Needle length: 9 cm Additional Notes Functioning IV was confirmed and monitors were applied. Sterile prep and drape, including hand hygiene and sterile gloves were used. The patient was positioned and the spine was prepped. The skin was anesthetized with lidocaine.  Free flow of clear CSF was obtained prior to injecting local anesthetic into the CSF.  The spinal needle aspirated freely following injection.  The needle was carefully withdrawn.  The patient tolerated the procedure well.   Anesthesia Regional Block:  Femoral nerve block  Pre-Anesthetic Checklist: ,, timeout performed, Correct Patient, Correct Site, Correct Laterality, Correct Procedure, Correct Position, site marked, Risks and benefits discussed,  Surgical consent,  Pre-op evaluation,  At surgeon's request and post-op pain management  Laterality: Left  Prep: chloraprep       Needles:  Injection technique: Single-shot  Needle Type: Echogenic Stimulator Needle     Needle Length: 5cm 5 cm Needle Gauge: 22 and 22 G    Additional Needles:  Procedures: ultrasound guided (picture in chart) and nerve stimulator Femoral nerve block  Nerve Stimulator or Paresthesia:  Response: quadraceps contraction, 0.45 mA,   Additional Responses:   Narrative:  Start time: 11/27/2014 12:02 PM End time: 11/27/2014 12:11  PM Injection made incrementally with aspirations every 5 mL.  Performed by: Personally  Anesthesiologist: Duane Boston  Additional Notes: Functioning IV was confirmed and monitors were applied.  A 55mm 22ga Arrow echogenic stimulator needle was used. Sterile prep and drape,hand hygiene and sterile gloves were used. Ultrasound guidance: relevant anatomy identified, needle position confirmed, local anesthetic spread visualized around nerve(s)., vascular puncture avoided.  Image printed for medical record. Negative aspiration and negative test dose prior to incremental administration of local anesthetic. The patient tolerated the procedure well.

## 2014-11-27 NOTE — Transfer of Care (Signed)
Immediate Anesthesia Transfer of Care Note  Patient: [name]  Procedure(s) Performed: Procedure(s): LEFT TOTAL KNEE ARTHROPLASTY (Left)  Patient Location: PACU  Anesthesia Type:MAC  Level of Consciousness: awake, alert  and oriented  Airway & Oxygen Therapy: Patient connected to nasal cannula oxygen  Post-op Assessment: Post -op Vital signs reviewed and stable  Post vital signs: stable  Last Vitals:  Filed Vitals:   11/27/14 1205  BP: 112/67  Pulse: 86  Temp:   Resp: 19    Complications: No apparent anesthesia complications

## 2014-11-27 NOTE — Progress Notes (Signed)
Utilization review completed.  

## 2014-11-28 ENCOUNTER — Encounter (HOSPITAL_COMMUNITY): Payer: Self-pay | Admitting: General Practice

## 2014-11-28 DIAGNOSIS — I480 Paroxysmal atrial fibrillation: Secondary | ICD-10-CM

## 2014-11-28 LAB — BASIC METABOLIC PANEL
ANION GAP: 9 (ref 5–15)
BUN: <5 mg/dL — ABNORMAL LOW (ref 6–20)
CALCIUM: 8.4 mg/dL — AB (ref 8.9–10.3)
CO2: 23 mmol/L (ref 22–32)
Chloride: 104 mmol/L (ref 101–111)
Creatinine, Ser: 0.78 mg/dL (ref 0.44–1.00)
GFR calc non Af Amer: >60 mL/min (ref >60)
GLUCOSE: 143 mg/dL — AB (ref 65–99)
POTASSIUM: 3.8 mmol/L (ref 3.5–5.1)
Sodium: 136 mmol/L (ref 135–145)

## 2014-11-28 LAB — CBC
HEMATOCRIT: 31 % — AB (ref 36.0–46.0)
HEMOGLOBIN: 9.5 g/dL — AB (ref 12.0–15.0)
MCH: 26.2 pg (ref 26.0–34.0)
MCHC: 30.6 g/dL (ref 30.0–36.0)
MCV: 85.4 fL (ref 78.0–100.0)
Platelets: 257 10*3/uL (ref 150–400)
RBC: 3.63 MIL/uL — AB (ref 3.87–5.11)
RDW: 16 % — ABNORMAL HIGH (ref 11.5–15.5)
WBC: 13.6 10*3/uL — AB (ref 4.0–10.5)

## 2014-11-28 LAB — GLUCOSE, CAPILLARY
GLUCOSE-CAPILLARY: 179 mg/dL — AB (ref 65–99)
GLUCOSE-CAPILLARY: 210 mg/dL — AB (ref 65–99)
GLUCOSE-CAPILLARY: 183 mg/dL — AB (ref 65–99)

## 2014-11-28 LAB — HEMOGLOBIN A1C
Hgb A1c MFr Bld: 6.6 % — ABNORMAL HIGH (ref 4.8–5.6)
MEAN PLASMA GLUCOSE: 143 mg/dL

## 2014-11-28 NOTE — Evaluation (Signed)
Physical Therapy Evaluation Patient Details Name: [name] MRN: [id number] DOB: 07/10/1945 Today's Date: 11/28/2014   History of Present Illness  69 y.o. female s/p L TKA.  Clinical Impression  Pt is s/p TKA resulting in the deficits listed below (see PT Problem List). On eval, pt required mod assist for bed mobility and mod to max assist for transfers. She was unable to progress gait due to pain and lethargy. Pt will benefit from skilled PT to increase their independence and safety with mobility to allow discharge to the venue listed below. Pt has arranged for SNF stay at [facility] at d/c.     Follow Up Recommendations SNF;Supervision/Assistance - 24 hour (Pt plans for a 2 week stay at [facility].)    Equipment Recommendations  None recommended by PT    Recommendations for Other Services       Precautions / Restrictions Precautions Precautions: Knee;Fall Required Braces or Orthoses: Knee Immobilizer - Left Knee Immobilizer - Left: On except when in CPM Restrictions Weight Bearing Restrictions: Yes LLE Weight Bearing: Weight bearing as tolerated      Mobility  Bed Mobility Overal bed mobility: Needs Assistance Bed Mobility: Supine to Sit     Supine to sit: Mod assist;HOB elevated     General bed mobility comments: verbal cues for sequencing. Use of bed rail.  Transfers Overall transfer level: Needs assistance Equipment used: Rolling walker (2 wheeled) Transfers: Sit to/from [facility] Sit to Stand: Mod assist Stand pivot transfers: Max assist       General transfer comment: Difficulty with weight transfers over LLE in order to progress RLE. Continuous verbal cues for sequencing/safety.  Ambulation/Gait             General Gait Details: unable to progress gait due to pain and lethargy.  Stairs            Wheelchair Mobility    Modified Rankin (Stroke Patients Only)       Balance                                             Pertinent Vitals/Pain Pain Assessment: 0-10 Pain Score: 7  Pain Location: L knee during mobility Pain Descriptors / Indicators: Aching;Burning Pain Intervention(s): Limited activity within patient's tolerance;Monitored during session;Repositioned;Ice applied;Premedicated before session    Home Living Family/patient expects to be discharged to:: Private residence Living Arrangements: Alone Available Help at Discharge: Friend(s);Available PRN/intermittently Type of Home: Apartment Home Access: Level entry     Home Layout: One level Home Equipment: Toilet riser;Shower seat;Cane - single point;Walker - 2 wheels      Prior Function Level of Independence: Independent with assistive device(s)         Comments: Ambulated with a cane. Community ambulator     Journalist, newspaper        Extremity/Trunk Assessment                         Communication   Communication: No difficulties  Cognition Arousal/Alertness: Lethargic;Suspect due to medications Behavior During Therapy: [facility] for tasks assessed/performed Overall Cognitive Status: Within Functional Limits for tasks assessed                      General Comments      Exercises Total Joint Exercises Goniometric ROM: 7-85 degrees L knee in  supine      Assessment/Plan    PT Assessment Patient needs continued PT services  PT Diagnosis Difficulty walking;Acute pain;Generalized weakness   PT Problem List Decreased strength;Decreased range of motion;Decreased activity tolerance;Decreased balance;Decreased mobility;Pain;Decreased safety awareness  PT Treatment Interventions DME instruction;Gait training;Functional mobility training;Therapeutic activities;Therapeutic exercise;Patient/family education;Balance training   PT Goals (Current goals can be found in the Care Plan section) Acute Rehab PT Goals Patient Stated Goal: independence PT Goal Formulation: With patient Time For Goal  Achievement: 12/12/14 Potential to Achieve Goals: Good    Frequency 7X/week   Barriers to discharge Decreased caregiver support      Co-evaluation               End of Session Equipment Utilized During Treatment: Gait belt;Left knee immobilizer;Oxygen Activity Tolerance: Patient limited by lethargy;Patient limited by pain Patient left: in chair;with call bell/phone within reach Nurse Communication: Mobility status         Time: [id number] PT Time Calculation (min) (ACUTE ONLY): 19 min   Charges:   PT Evaluation $Initial PT Evaluation Tier I: 1 Procedure     PT G Codes:        Lorriane Shire 11/28/2014, 9:58 AM

## 2014-11-28 NOTE — Progress Notes (Signed)
Called Dr [name] to let him know [name] has an allergy to ASA, Dr [name] states it is ok due to the plavix no new orders [name]

## 2014-11-28 NOTE — Progress Notes (Signed)
Orthopedic Tech Progress Note Patient Details:  [name] [birth year] [id number]  CPM Left Knee CPM Left Knee: On Left Knee Flexion (Degrees): 60 Left Knee Extension (Degrees): 0   Braulio Bosch 11/28/2014, 5:34 PM

## 2014-11-28 NOTE — Evaluation (Signed)
Occupational Therapy Evaluation Patient Details Name: [name] MRN: [id number] DOB: [birth year] Today's Date: 11/28/2014    History of Present Illness 69 y.o. female s/p L TKA.   Clinical Impression   Pt reports she was independent with ADLs PTA. Attempted sit <> stand from EOB x 2 with max assist +1, unsuccessful for pt to come to standing. Began ADL and safety education; pt verbalized understanding. Recommending SNF for further rehab prior to returning home in order to maximize independence and safety with ADLs and functional mobility. All further OT needs can be met at the next venue of care; signing off at this time. Please re-consult if change in medical status occurs. Thank you for this referral.     Follow Up Recommendations  SNF;Supervision/Assistance - 24 hour    Equipment Recommendations  Other (comment) (TBD at next venue)    Recommendations for Other Services       Precautions / Restrictions Precautions Precautions: Knee;Fall Required Braces or Orthoses: Knee Immobilizer - Left Knee Immobilizer - Left: On except when in CPM Restrictions Weight Bearing Restrictions: Yes LLE Weight Bearing: Weight bearing as tolerated      Mobility Bed Mobility Overal bed mobility: Needs Assistance Bed Mobility: Rolling;Supine to Sit;Sit to Supine Rolling: Min guard   Supine to sit: Mod assist Sit to supine: Mod assist   General bed mobility comments: Use of bed rail. Mod A for assist with LLE and trunk.  Transfers Overall transfer level: Needs assistance Equipment used: Rolling walker (2 wheeled) Transfers: Sit to/from Stand           General transfer comment: Attempted sit <> stand from EOB x 2, pt unable to come to standing position with max assist +1.    Balance                                            ADL Overall ADL's : Needs assistance/impaired Eating/Feeding: Set up;Sitting   Grooming: Set up;Sitting       Lower Body  Bathing: Maximal assistance;+2 for physical assistance;Sit to/from stand Lower Body Bathing Details (indicate cue type and reason): +2 for sit to stand     Lower Body Dressing: Maximal assistance;+2 for physical assistance Lower Body Dressing Details (indicate cue type and reason): +2 for sit to stand. Pt unable to reach B feet   Toilet Transfer Details (indicate cue type and reason): Pt able to roll to L side to place bed pan.           General ADL Comments: No family present for OT eval. Educated on safety with RW; pt verbalized understanding.     Vision     Perception     Praxis      Pertinent Vitals/Pain Pain Assessment: 0-10 Pain Score: 9  Pain Location: L knee Pain Descriptors / Indicators: Aching;Grimacing;Guarding;Sore Pain Intervention(s): Limited activity within patient's tolerance;Monitored during session     Hand Dominance     Extremity/Trunk Assessment Upper Extremity Assessment Upper Extremity Assessment: Generalized weakness   Lower Extremity Assessment Lower Extremity Assessment: Defer to PT evaluation       Communication Communication Communication: No difficulties   Cognition Arousal/Alertness: Lethargic;Suspect due to medications Behavior During Therapy: [facility] for tasks assessed/performed Overall Cognitive Status: Within Functional Limits for tasks assessed  General Comments       Exercises       Shoulder Instructions      Home Living Family/patient expects to be discharged to:: Skilled nursing facility Living Arrangements: Alone                                      Prior Functioning/Environment Level of Independence: Independent with assistive device(s)        Comments: Ambulated with a cane. Community ambulator    OT Diagnosis: Generalized weakness;Acute pain   OT Problem List:     OT Treatment/Interventions:      OT Goals(Current goals can be found in the care plan section)  Acute Rehab OT Goals Patient Stated Goal: return to independence  OT Goal Formulation: With patient  OT Frequency:     Barriers to D/C:            Co-evaluation              End of Session Equipment Utilized During Treatment: Gait belt;Rolling walker;Left knee immobilizer;Oxygen  Activity Tolerance: Patient limited by pain;Patient limited by lethargy Patient left: in bed;with call bell/phone within reach   Time: 6546-5035 OT Time Calculation (min): 13 min Charges:  OT General Charges $OT Visit: 1 Procedure OT Evaluation $Initial OT Evaluation Tier I: 1 Procedure G-Codes:     Binnie Kand M.S., OTR/L Pager: [phone]  11/28/2014, 4:43 PM

## 2014-11-28 NOTE — Progress Notes (Signed)
Subjective: 1 Day Post-Op Procedure(s) (LRB): LEFT TOTAL KNEE ARTHROPLASTY (Left) Patient reports pain as 6 on 0-10 scale.    Objective: Vital signs in last 24 hours: Temp:  [97.7 F (36.5 C)-99.5 F (37.5 C)] 99.5 F (37.5 C) (11/10 0445) Pulse Rate:  [60-107] 107 (11/10 0445) Resp:  [11-23] 16 (11/10 0445) BP: (94-138)/(47-76) 122/58 mmHg (11/10 0445) SpO2:  [95 %-100 %] 97 % (11/10 0445) Weight:  [102.059 kg (225 lb)] 102.059 kg (225 lb) (11/09 1008)  Intake/Output from previous day: 11/09 0701 - 11/10 0700 In: 1400 [I.V.:1400] Out: 300 [Urine:300] Intake/Output this shift:     Recent Labs  11/27/14 1040  HGB 10.7*    Recent Labs  11/27/14 1040  WBC 9.8  RBC 4.04  HCT 34.6*  PLT 311   No results for input(s): NA, K, CL, CO2, BUN, CREATININE, GLUCOSE, CALCIUM in the last 72 hours. No results for input(s): LABPT, INR in the last 72 hours.  Neurologically intact  Assessment/Plan: 1 Day Post-Op Procedure(s) (LRB): LEFT TOTAL KNEE ARTHROPLASTY (Left) Up with therapy  Demonie Kassa C 11/28/2014, 8:14 AM

## 2014-11-28 NOTE — Op Note (Signed)
[name], [name]                 ACCOUNT NO.:  [id number]  MEDICAL RECORD NO.:  [id number]  LOCATION:  5N06C                        FACILITY:  [place] [facility]  PHYSICIAN:  Chessie Neuharth C. Lorin Mercy, M.D.    DATE OF BIRTH:  Sep 04, 1945  DATE OF PROCEDURE:  11/27/2014 DATE OF DISCHARGE:                              OPERATIVE REPORT   PREOPERATIVE DIAGNOSIS:  Left knee osteoarthritis (primary knee osteoarthritis).  POSTOPERATIVE DIAGNOSIS:  Left knee osteoarthritis (primary knee osteoarthritis).  PROCEDURE:  Left total knee arthroplasty, cemented.  SURGEON:  Vaidehi Braddy C. Lorin Mercy, M.D.  ASSISTANT:  Alyson Locket. Ricard Dillon, PA-C.medically necessary and present for the entire procedure.   ANESTHESIA:  Spinal with preoperative block.  TOURNIQUET TIME:  Approximately 1 hour.  INDICATIONS:  This was planned total knee arthroplasty with computer assist, but the computer would not function properly and record appropriate points for determining the femoral head so after several attempts, this was abandoned and a standard total knee arthroplasty was done with intramedullary alignment on the femur and extramedullary on the tibia.  DESCRIPTION OF PROCEDURE:  After induction of spinal anesthesia with proximal thigh tourniquet,lateral post, heel bump, standard prepping with DuraPrep, usual extremity sheets, drapes, impervious stockinette, Coban, sterile skin marker, Betadine, Steri-Drape was applied.  Time-out procedure was completed and Ancef given prophylactically.  The patient had previous opposite right total knee arthroplasty, 2.5 was the appropriate size.  After prepping and draping, wrapping with an Esmarch, tourniquet was inflated.  Midline incision was made.  Superficial retinaculum was developed.  Medial parapatellar incision was made splitting quad tendon between the medial one-third and lateral two- thirds.  Patella was everted, cut 10 mm off the bone, sized and prepared for 35 mm 3-PEG poly.  Pins were placed in  the femur and the tibia and multiple attempts were made trying to establish assessment of the femoral head to start computer navigation.  This was unsuccessful, computer was not reading points properly, and pins were removed, abandoned, and standard intramedullary alignment cut was made on the femur measured 2.5 taking 8 to 9 off the high, 2.5 on the tibia cut was made.  External guide was aligned, clicked 2 clicks medially to prevent varus, pinned, and then cuts were made.  Spacer blocks were used, which gave excellent fit.  It was fairly tight.  There were some large posterior spurs on the femur, which were removed.  Remaining remnants of the PCL was resected.  De[employer] was used.  Box cut was made in the femur after chamfer cuts.  All spurs were removed.  There was tricompartmental degenerative changes bone on bone, marginal osteophytes, subchondral cyst formation noted at the initial portion of the surgery.  Keel preparation was made with appropriate rotation.  Trials were inserted.  Full extension, good flexion/extension balance.  Trials were removed, pulse lavaged, and cement mixed.  Vacuum mixing the cement, cementing of the tibia followed by femur, placement of 10 mm poly and 3-PEG patella held with self-retaining clamp.  Cement was hardened at 15 minutes, tourniquet deflated, hemostasis obtained, and standard layered closure, subcuticular closure on the skin, #1 nonabsorbable in the deep quad tendon fascia.  Instrument count and needle count was correct.     Nahima Ales C. Lorin Mercy, M.D.     MCY/MEDQ  D:  11/27/2014  T:  11/28/2014  Job:  [id number]

## 2014-11-28 NOTE — Consult Note (Signed)
ELECTROPHYSIOLOGY CONSULT  NOTE    Patient ID: [name] MRN: [id number], DOB/AGE: [birth year] 69 y.o.  Admit date: 11/27/2014 Date of Consult: 11/28/2014   Primary Physician: Hoyt Koch, MD Primary Cardiologist: Dr. Lovena Le  Reason for Consultation: The patient was noted via her loop recorder to have an episode of PAFib.    HPI: [name] is a 69 y.o. female  S/p L knee surgery, POD #1.  She is know to the office with a loop recorder implanted for evaluation of recurrent syncope.  The patient was noted via her linq device that she had an episode of PAfib on Oct 6th lasting 12 minutes.    When the office called the patient to notify her of the findings and discuss a plan of care with her it was noted she was currently in the hospital s/p knee surgery.  She is feeling well this morning, she has some mild post-op knee pain, no CP, palpitations or SOB.  The patient does say though she has long history of occasional palpitations, and historically with other monitoring up until now has not disclosed arrhythmias.  She reports her last syncopal episode about 8 months ago or so, no near syncope or dizzy spells.  In discussion with the patient the episode of AF did not correlate with any particular symptoms that she can recall.   The patients CHADS2vasc score is at least 4 with indication for anticoagulation.  The patient states historically she had an ulcer that bled some years ago, she reports felt secondary to her arthritis medicine at the time.  ASA is listed as an allergy she reports gives her palpitations but no other reaction.  She is currently on Plavix (no ASA) at home without bleeding she reports.  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      Past Medical History  Diagnosis Date  . Myocardial infarction [facility]) [place] [facility] W/Spect W/Wall  Motion EF on 08/07/10 - IMPRESSION: 1) No evidence of ischemia.  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  3) Normal LV wall motion.  4) Estimated Q G S EF 70%, unchanged.  . Asthma   . Blood transfusion   . GERD (gastroesophageal reflux disease)   . Hiatal hernia   . Other forms of migraine, without mention of intractable migraine without mention of status migrainosus   . Dry eye     Chronic  . Hypercholesteremia   . Spondylolisthesis   . Diverticulosis of colon (without mention of hemorrhage)     Colonoscopy 11/09/09 - FINDINGS: 1) 35mm polp in transverse colon, resected & retrieved.  2) Diverticulosis in sigmoid & descending colon.  3) Medium-sized, internal hemorrhoids.  . Internal hemorrhoids without mention of complication     Colonoscopy 11/09/09 - FINDINGS: 1) 29mm polp in transverse colon, resected & retrieved.  2) Diverticulosis in sigmoid & descending colon.  3) Medium-sized, internal hemorrhoids.  . Benign neoplasm of colon 11/09/09    Colonoscopy 11/09/09 - FINDINGS: 1) 105mm polp in transverse colon, resected & retrieved.  2) Diverticulosis in sigmoid & descending colon.  3) Medium-sized, internal hemorrhoids.  . Hemorrhage of rectum and anus     Colonoscopy 11/09/09 - FINDINGS: 1) 62mm polp in transverse colon, resected & retrieved.  2) Diverticulosis in sigmoid & descending colon.  3)  Medium-sized, internal hemorrhoids.  . Essential hypertension, benign   . Esophagitis   . CMC arthritis     Left middle finger  . Depression     40+ years  . Diabetes mellitus 10/2008    type 2  . Breast cancer ([place]) 1989    Left breast cancer  . Obese   . Syncope     MDT LinQ implanted for evaluation  . Facet arthropathy, lumbar     L3-4  . Obstructive sleep apnea     uses oxygen at night  . Allergy   . Anemia   . Arthritis   . Blood transfusion without reported diagnosis   . Cataract   . Glaucoma   . Oxygen deficiency     .2  .  Dysrhythmia   . Anginal pain ([place])   . Shortness of breath dyspnea   . Chronic kidney disease     states she has a cyst on her kidney     Surgical History:  Past Surgical History  Procedure Laterality Date  . Abdominal hysterectomy  1978  . Joint replacement      lsft hip x2, rt knee  . Knee arthroscopy Right 2002    By Dr. Lorin Mercy  . Foot surgery Bilateral     Bil feet great toes and second toes spurs. By Dr. Milinda Pointer in 2004.  . Breast biopsy Left 1995    [facility] ([facility]  . Tonsillectomy    . Back surgery      x2 one for infection  . Cholecystectomy    . Total hip revision  12/14/2010    Procedure: TOTAL HIP REVISION;  Surgeon: Marybelle Killings;  Location: [place];  Service: Orthopedics;  Laterality: Left;  Left Total Hip Arthroplasty Revision, Poly and Ball Exchange, Possible Acetabular Revision vs. Poly Exchange  . Colonoscopy w/ polypectomy  11/09/09    FINDINGS: 1) 82mm polp in transverse colon, resected & retrieved.  2) Diverticulosis in sigmoid & descending colon.  3) Medium-sized, internal hemorrhoids.  . Loop recorder implant  11/15/06; 04/02/2013    MDT Reveal implanted 10/2006 for syncope; removed with MDT LinQ implanted 03/2013 by Dr Lovena Le  . Hemorrhoid surgery  2011    Internal & external hemorrhoidectomy (x) 3 quadrants  . Esophagogastroduodenoscopy  02/15/07  . Dilation and curettage of uterus    . Trigger finger release Left 01/02/2013    Procedure: RELEASE LEFT MIDDLE TRIGGER FINGER/LEFT INDEX AND LEFT RING FINGER;  Surgeon: Wynonia Sours, MD;  Location: [place];  Service: Orthopedics;  Laterality: Left;  ANESTHESIA: GENERAL/IV REGIONAL FAB  . Loop recorder implant N/A 04/02/2013    Procedure: LOOP RECORDER IMPLANT;  Surgeon: Evans Lance, MD;  [facility];  Service: Cardiovascular;  Laterality: N/A;  . Toe surgery    . Colonoscopy    . Total knee arthroplasty Left 11/27/2014     Prescriptions prior to admission    Medication Sig Dispense Refill Last Dose  . acetaminophen (TYLENOL) 325 MG tablet Take 2 tablets (650 mg total) by mouth every 4 (four) hours as needed for headache or mild pain.   11/26/2014 at Unknown time  . amitriptyline (ELAVIL) 50 MG tablet Take one table by mouth every night at bedtime. 90 tablet 3 11/26/2014 at Unknown time  . atorvastatin (LIPITOR) 40 MG tablet Take 40 mg by mouth daily.    11/26/2014 at Unknown time  . cholecalciferol (VITAMIN D) 1000 UNITS tablet Take  1,000 Units by mouth daily.   11/26/2014 at Unknown time  . clopidogrel (PLAVIX) 75 MG tablet Take 1 tablet (75 mg total) by mouth daily. 90 tablet 3 Taking  . cycloSPORINE (RESTASIS) 0.05 % ophthalmic emulsion Place 1 drop into both eyes 2 (two) times daily.    11/27/2014 at Unknown time  . DEXILANT 60 MG capsule TAKE 1 CAPSULE BY MOUTH ONCE DAILY 90 capsule 3 11/27/2014 at Unknown time  . diazepam (VALIUM) 5 MG tablet TAKE 1 TABLET BY MOUTH TWICE DAILY FOR MUSCLE SPASMS 60 tablet 1 11/27/2014 at Unknown time  . diclofenac sodium (VOLTAREN) 1 % GEL Apply 1 application topically daily as needed (pain).    11/26/2014 at Unknown time  . diltiazem (CARDIZEM CD) 180 MG 24 hr capsule TAKE 1 CAPSULE BY MOUTH ONCE DAILY 90 capsule 3 11/27/2014 at Unknown time  . diphenhydrAMINE (BENADRYL) 25 mg capsule Take 25 mg by mouth at bedtime.   11/26/2014 at Unknown time  . FIBER SELECT GUMMIES PO Take 2 capsules by mouth daily.   Past Week at Unknown time  . Fluocinolone Acetonide 0.01 % OIL Place 2 drops into both ears daily as needed. Does every two weeks   Past Week at Unknown time  . fluticasone-salmeterol (ADVAIR HFA) 115-21 MCG/ACT inhaler Inhale 2 puffs into the lungs 2 (two) times daily. 1 Inhaler 12 11/27/2014 at Unknown time  . gabapentin (NEURONTIN) 300 MG capsule Take 2 capsules of 300 mg each at night by mouth between one and 2 hours prior to bedtime. You can take another 300 mg in daytime for pain. 270 capsule 3 11/27/2014 at Unknown  time  . HYDROcodone-acetaminophen (NORCO) 10-325 MG per tablet Take 1 tablet by mouth every 6 (six) hours as needed for moderate pain or severe pain.    11/27/2014 at Unknown time  . hyoscyamine (LEVSIN SL) 0.125 MG SL tablet PLACE 2 TABLETS UNDER THE TONGUE EVERY 4 HOURS AS NEEDED. 30 tablet 1 Past Month at Unknown time  . ipratropium (ATROVENT) 0.06 % nasal spray Place 2 sprays into both nostrils 4 (four) times daily. (Patient taking differently: Place 2 sprays into both nostrils 3 (three) times daily as needed. ) 15 mL 12 11/27/2014 at Unknown time  . isosorbide mononitrate (IMDUR) 60 MG 24 hr tablet Take 1 tablet (60 mg total) by mouth daily. 90 tablet 3 11/27/2014 at Unknown time  . losartan (COZAAR) 100 MG tablet Take 1 tablet (100 mg total) by mouth daily. 90 tablet 3 11/26/2014 at Unknown time  . metoCLOPramide (REGLAN) 10 MG tablet Take one tab before each meal and at bedtime (Patient taking differently: Take one tablet by mouth before each meal and at bedtime) 120 tablet 2 11/26/2014 at Unknown time  . olopatadine (PATANOL) 0.1 % ophthalmic solution Insert 1 drop in both eyes twice daily  6 11/27/2014 at Unknown time  . OVER THE COUNTER MEDICATION Take 1 capsule by mouth daily. Hair skin and Nail   11/26/2014 at Unknown time  . PROAIR HFA 108 (90 BASE) MCG/ACT inhaler INHALE 2 PUFFS BY MOUTH EVERY 6 HOURS FOR WHEEZING AND SHORTNESS OF BREATH 1 Inhaler 0 11/27/2014 at Unknown time  . Saxagliptin-Metformin 05-998 MG TB24 Take by mouth. Take one tablet every evening with a meal.   11/26/2014 at Unknown time  . topiramate (TOPAMAX) 100 MG tablet TAKE 1 TABLET BY MOUTH EVERY EVENING 30 tablet 2 11/26/2014 at Unknown time  . topiramate (TOPAMAX) 25 MG tablet TAKE 1 TABLET BY MOUTH EVERY  MORNING. 90 tablet 3 11/27/2014 at Unknown time  . nitroGLYCERIN (NITROLINGUAL) 0.4 MG/SPRAY spray Place 1 spray under the tongue every 5 (five) minutes as needed. Chest pains 12 g 0 Unknown at Unknown time    Inpatient  Medications:  . aspirin EC  325 mg Oral Q breakfast  . atorvastatin  40 mg Oral q1800  . clopidogrel  75 mg Oral Daily  . cycloSPORINE  1 drop Both Eyes BID  . diltiazem  180 mg Oral Daily  . docusate sodium  100 mg Oral BID  . gabapentin  300 mg Oral QHS  . ipratropium  2 spray Each Nare QID  . isosorbide mononitrate  60 mg Oral Daily  . linagliptin  5 mg Oral QPC supper   And  . metFORMIN  1,000 mg Oral QPC supper  . losartan  100 mg Oral Daily  . metoCLOPramide  10 mg Oral TID AC & HS  . mometasone-formoterol  2 puff Inhalation BID  . olopatadine  1 drop Both Eyes BID  . pantoprazole  40 mg Oral Daily  . topiramate  100 mg Oral QPM  . topiramate  25 mg Oral q morning - 10a    Allergies:  Allergies  Allergen Reactions  . Contrast Media [Iodinated Diagnostic Agents] Anaphylaxis  . Gadolinium Anaphylaxis       . Aspirin Nausea And Vomiting    Fast heart rate  . Iohexol Other (See Comments)    unknown  . Trazodone And Nefazodone     Dizziness,lightheadness, heart felt funny    Social History   Social History  . Marital Status: Single    Spouse Name: N/A  . Number of Children: 2  . Years of Education: college   Occupational History  . Retired    Social History Main Topics  . Smoking status: Former Smoker    Types: Cigarettes    Quit date: 01/19/1983  . Smokeless tobacco: Never Used  . Alcohol Use: No  . Drug Use: No  . Sexual Activity: Not on file   Other Topics Concern  . Not on file   Social History Narrative   Patient is single and lives alone.   Patient has two children.   Patient is retired.   Patient has a college education.   Patient is right-handed.   Patient does not drink any caffeine.     Family History  Problem Relation Age of Onset  . Pancreatic cancer Mother   . Heart attack Father   . Diabetes Son   . Arthritis Brother     Severe/Crippling  . Tuberculosis Brother   . Hypertension Brother     x 2  . Heart disease Sister   .  Diabetes Brother   . Seizures Brother   . Autoimmune disease Brother     AIDS  . Colon cancer Brother   . Lung cancer Brother     mets     Review of Systems: All other systems reviewed and are otherwise negative except as noted above.  Physical Exam: Filed Vitals:   11/27/14 2010 11/27/14 2035 11/28/14 0052 11/28/14 0445  BP:  102/62 106/47 122/58  Pulse:  84 91 107  Temp:  98.3 F (36.8 C) 98.9 F (37.2 C) 99.5 F (37.5 C)  TempSrc:   Oral   Resp:  16 16 16   Weight:      SpO2: 98% 95% 96% 97%    GEN- The patient is obese, well appearing, alert and oriented x  3 today.   HEENT: normocephalic, atraumatic; sclera clear, conjunctiva pink; hearing intact; oropharynx clear; neck supple, no JVP Lungs- Clear to ausculation bilaterally, normal work of breathing.  No wheezes, rales, rhonchi Heart- Regular rate and rhythm, no murmurs, rubs or gallops, PMI not laterally displaced GI- soft, non-tender, non-distended, bowel sounds present Extremities- no clubbing, cyanosis, or edema MS- no significant deformity or atrophy Skin- warm and dry, no rash or lesion Psych- euthymic mood, full affect Neuro- no gross deficits observed  Labs:   Lab Results  Component Value Date   WBC 13.6* 11/28/2014   HGB 9.5* 11/28/2014   HCT 31.0* 11/28/2014   MCV 85.4 11/28/2014   PLT 257 11/28/2014     Recent Labs Lab 11/28/14 0750  NA 136  K 3.8  CL 104  CO2 23  BUN <5*  CREATININE 0.78  CALCIUM 8.4*  GLUCOSE 143*   12/23/14: Lexiscan myoview IMPRESSION: 1. No reversible ischemia or infarction. 2. Normal left ventricular wall motion. 3. Left ventricular ejection fraction 42% 4. Low-risk stress test findings*.   Assessment and Plan:  1. PAFib, new finding on loop recorder.     She has indication for full anticoagulation, a DOAC would be preferred from our standpoint when cleared by orthopedic surgeon to start.     We have arranged an appointment for the patient with the AF  clinic, Honour Schwieger plan to get an updated echo doppler as well.  Signed, Tommye Standard, PA-C 11/28/2014 11:35 AM   I have seen and examined this patient with Tommye Standard.  Agree with above, note added to reflect my findings.  On exam, regular rhythm, no murmurs, lungs clear.  Atrial fibrillation was noted on the patient's loop recorder which was monitoring her for syncope. It is unlikely that the atrial fibrillation caused her syncope but she does have an elevated chads2vasc score and would need to be anticoagulated in the future. Her chads 2 vasc score is 5. When she is stable from a surgical standpoint, would start her on anticoagulation with Eliquis 5 mg twice daily. She Roni Scow have an appointment in the atrial fibrillation clinic. We'll get an updated echo as well.  Javione Gunawan M. Masin Shatto MD 11/28/2014 3:24 PM

## 2014-11-29 ENCOUNTER — Inpatient Hospital Stay (HOSPITAL_COMMUNITY): Payer: Medicare Other

## 2014-11-29 DIAGNOSIS — I252 Old myocardial infarction: Secondary | ICD-10-CM | POA: Diagnosis not present

## 2014-11-29 DIAGNOSIS — I129 Hypertensive chronic kidney disease with stage 1 through stage 4 chronic kidney disease, or unspecified chronic kidney disease: Secondary | ICD-10-CM | POA: Diagnosis not present

## 2014-11-29 DIAGNOSIS — I4891 Unspecified atrial fibrillation: Secondary | ICD-10-CM | POA: Diagnosis not present

## 2014-11-29 DIAGNOSIS — Z471 Aftercare following joint replacement surgery: Secondary | ICD-10-CM | POA: Diagnosis not present

## 2014-11-29 DIAGNOSIS — M25561 Pain in right knee: Secondary | ICD-10-CM | POA: Diagnosis not present

## 2014-11-29 DIAGNOSIS — R55 Syncope and collapse: Secondary | ICD-10-CM | POA: Diagnosis not present

## 2014-11-29 DIAGNOSIS — M1712 Unilateral primary osteoarthritis, left knee: Secondary | ICD-10-CM | POA: Diagnosis not present

## 2014-11-29 DIAGNOSIS — E669 Obesity, unspecified: Secondary | ICD-10-CM | POA: Diagnosis not present

## 2014-11-29 DIAGNOSIS — M5387 Other specified dorsopathies, lumbosacral region: Secondary | ICD-10-CM | POA: Diagnosis not present

## 2014-11-29 DIAGNOSIS — F329 Major depressive disorder, single episode, unspecified: Secondary | ICD-10-CM | POA: Diagnosis not present

## 2014-11-29 DIAGNOSIS — E7800 Pure hypercholesterolemia, unspecified: Secondary | ICD-10-CM | POA: Diagnosis not present

## 2014-11-29 DIAGNOSIS — G43909 Migraine, unspecified, not intractable, without status migrainosus: Secondary | ICD-10-CM | POA: Diagnosis not present

## 2014-11-29 DIAGNOSIS — K219 Gastro-esophageal reflux disease without esophagitis: Secondary | ICD-10-CM | POA: Diagnosis not present

## 2014-11-29 DIAGNOSIS — Z96652 Presence of left artificial knee joint: Secondary | ICD-10-CM | POA: Diagnosis not present

## 2014-11-29 DIAGNOSIS — D558 Other anemias due to enzyme disorders: Secondary | ICD-10-CM | POA: Diagnosis not present

## 2014-11-29 DIAGNOSIS — E1149 Type 2 diabetes mellitus with other diabetic neurological complication: Secondary | ICD-10-CM | POA: Diagnosis not present

## 2014-11-29 DIAGNOSIS — E1165 Type 2 diabetes mellitus with hyperglycemia: Secondary | ICD-10-CM | POA: Diagnosis not present

## 2014-11-29 DIAGNOSIS — K5790 Diverticulosis of intestine, part unspecified, without perforation or abscess without bleeding: Secondary | ICD-10-CM | POA: Diagnosis not present

## 2014-11-29 DIAGNOSIS — K209 Esophagitis, unspecified: Secondary | ICD-10-CM | POA: Diagnosis not present

## 2014-11-29 DIAGNOSIS — M545 Low back pain: Secondary | ICD-10-CM | POA: Diagnosis not present

## 2014-11-29 DIAGNOSIS — R2689 Other abnormalities of gait and mobility: Secondary | ICD-10-CM | POA: Diagnosis not present

## 2014-11-29 DIAGNOSIS — M25562 Pain in left knee: Secondary | ICD-10-CM | POA: Diagnosis not present

## 2014-11-29 DIAGNOSIS — I10 Essential (primary) hypertension: Secondary | ICD-10-CM | POA: Diagnosis not present

## 2014-11-29 DIAGNOSIS — M1990 Unspecified osteoarthritis, unspecified site: Secondary | ICD-10-CM | POA: Diagnosis not present

## 2014-11-29 DIAGNOSIS — K648 Other hemorrhoids: Secondary | ICD-10-CM | POA: Diagnosis not present

## 2014-11-29 DIAGNOSIS — J45998 Other asthma: Secondary | ICD-10-CM | POA: Diagnosis not present

## 2014-11-29 DIAGNOSIS — J45909 Unspecified asthma, uncomplicated: Secondary | ICD-10-CM | POA: Diagnosis not present

## 2014-11-29 DIAGNOSIS — Z853 Personal history of malignant neoplasm of breast: Secondary | ICD-10-CM | POA: Diagnosis not present

## 2014-11-29 DIAGNOSIS — E785 Hyperlipidemia, unspecified: Secondary | ICD-10-CM | POA: Diagnosis not present

## 2014-11-29 DIAGNOSIS — K449 Diaphragmatic hernia without obstruction or gangrene: Secondary | ICD-10-CM | POA: Diagnosis not present

## 2014-11-29 DIAGNOSIS — M4310 Spondylolisthesis, site unspecified: Secondary | ICD-10-CM | POA: Diagnosis not present

## 2014-11-29 DIAGNOSIS — E119 Type 2 diabetes mellitus without complications: Secondary | ICD-10-CM | POA: Diagnosis not present

## 2014-11-29 LAB — GLUCOSE, CAPILLARY
Glucose-Capillary: 160 mg/dL — ABNORMAL HIGH (ref 65–99)
Glucose-Capillary: 172 mg/dL — ABNORMAL HIGH (ref 65–99)
Glucose-Capillary: 188 mg/dL — ABNORMAL HIGH (ref 65–99)

## 2014-11-29 LAB — CBC
HCT: 29.6 % — ABNORMAL LOW (ref 36.0–46.0)
HEMOGLOBIN: 9.5 g/dL — AB (ref 12.0–15.0)
MCH: 26.8 pg (ref 26.0–34.0)
MCHC: 32.1 g/dL (ref 30.0–36.0)
MCV: 83.6 fL (ref 78.0–100.0)
Platelets: 234 10*3/uL (ref 150–400)
RBC: 3.54 MIL/uL — AB (ref 3.87–5.11)
RDW: 15.7 % — ABNORMAL HIGH (ref 11.5–15.5)
WBC: 19.4 10*3/uL — AB (ref 4.0–10.5)

## 2014-11-29 LAB — BASIC METABOLIC PANEL
ANION GAP: 12 (ref 5–15)
BUN: <5 mg/dL — ABNORMAL LOW (ref 6–20)
CO2: 22 mmol/L (ref 22–32)
Calcium: 8.6 mg/dL — ABNORMAL LOW (ref 8.9–10.3)
Chloride: 103 mmol/L (ref 101–111)
Creatinine, Ser: 0.83 mg/dL (ref 0.44–1.00)
GFR calc Af Amer: >60 mL/min (ref >60)
GLUCOSE: 169 mg/dL — AB (ref 65–99)
POTASSIUM: 3.6 mmol/L (ref 3.5–5.1)
Sodium: 137 mmol/L (ref 135–145)

## 2014-11-29 MED ORDER — POLYETHYLENE GLYCOL 3350 17 G PO PACK: 17.0000 g | PACK | Freq: Every day | ORAL | Status: DC | PRN

## 2014-11-29 MED ORDER — ENOXAPARIN SODIUM 30 MG/0.3ML ~~LOC~~ SOLN: 30.0000 mg | Freq: Two times a day (BID) | SUBCUTANEOUS | Status: DC

## 2014-11-29 MED ORDER — OXYCODONE-ACETAMINOPHEN 5-325 MG PO TABS: 1.0000 | ORAL_TABLET | ORAL | Status: DC | PRN

## 2014-11-29 MED ORDER — APIXABAN 5 MG PO TABS
5.0000 mg | ORAL_TABLET | Freq: Two times a day (BID) | ORAL | Status: DC
Start: 2014-11-29 — End: 2015-01-03

## 2014-11-29 MED ORDER — METHOCARBAMOL 500 MG PO TABS: 500.0000 mg | ORAL_TABLET | Freq: Four times a day (QID) | ORAL | Status: DC | PRN

## 2014-11-29 MED ORDER — APIXABAN 5 MG PO TABS
5.0000 mg | ORAL_TABLET | Freq: Two times a day (BID) | ORAL | Status: DC
  Administered 2014-11-29: 5 mg via ORAL
  Filled 2014-11-29: qty 1

## 2014-11-29 NOTE — Clinical Social Work Note (Signed)
Clinical Social Work Assessment  Patient Details  Name: [name] MRN: [id number] Date of Birth: [birth year]  Date of referral:  11/29/14               Reason for consult:  Facility Placement                Permission sought to share information with:  Facility Art therapist granted to share information::  Yes, Verbal Permission Granted  Name::        Agency::  SNF admissions  Relationship::     Contact Information:     Housing/Transportation Living arrangements for the past 2 months:  Apartment Source of Information:  Patient Patient Interpreter Needed:  None Criminal Activity/Legal Involvement Pertinent to Current Situation/Hospitalization:  No - Comment as needed Significant Relationships:  Friend Lives with:    Do you feel safe going back to the place where you live?  Yes Need for family participation in patient care:  No (Coment)  Care giving concerns:  Patient feels she needs some short term rehab before she can return back home.   Social Worker assessment / plan:  Patient is a 69 year old female who is alert and oriented x4.  Patient states she lives alone and has a son.  Patient is pleasant to talk to and able to make her own decisions.  Patient preregistered at [facility] and is planning to go there for short term rehab.  Patient expresses that she has never been to SNF for short term rehab, CSW explained to patient what to expect.  Patient stated she is looking forward to going to SNF for rehab in order to get her strength up in order to return home.  Patient expressed she did not have any other questions, CSW explained SNF search process.  Employment status:  Retired Nurse, adult PT Recommendations:  [place] / Referral to community resources:     Patient/Family's Response to care:  Patient in agreement to going to SNF for short term rehab.   Patient/Family's Understanding of and  Emotional Response to Diagnosis, Current Treatment, and Prognosis:  Patient is aware of her current diagnosis and treatment plan.  Emotional Assessment Appearance:  Appears stated age Attitude/Demeanor/Rapport:    Affect (typically observed):  Calm, Pleasant, Stable Orientation:  Oriented to Self, Oriented to Place, Oriented to  Time, Oriented to Situation Alcohol / Substance use:  Not Applicable Psych involvement (Current and /or in the community):  No (Comment)  Discharge Needs  Concerns to be addressed:  Lack of Support Readmission within the last 30 days:  No Current discharge risk:  Lives alone, Lack of support system Barriers to Discharge:  Insurance Authorization   Anell Barr 11/29/2014, 1:08 PM

## 2014-11-29 NOTE — Progress Notes (Addendum)
Discussed with Dr. Curt Bears, patient is pending discharge per ortho, he is aware she has been placed on the Eliquis with the Plavix, and we Maddox Bratcher follow up with her and review her echocardiogram result at her scheduled AFib clinic appointment next week.    Tommye Standard, PA-C  Patient to be discharged on Eliquis for atrial fibrillation.  Riley Hallum follow up in AF clinic to discuss further management.  Continue plavix.  Allegra Lai, MD

## 2014-11-29 NOTE — NC FL2 (Signed)
[place] MEDICAID FL2 LEVEL OF CARE SCREENING TOOL     IDENTIFICATION  Patient Name: [name] Birthdate: 1945/11/23 Sex: female Admission Date (Current Location): 11/27/2014  [facility] and [place] Number: Herbalist and Address:  [facility], [place] [address] [place], [place] [address]      Provider Number: [id number]  Attending Physician Name and Address:  Sydney Killings, MD  Relative Name and Phone Number:  [name] [phone]    or [phone]    Current Level of Care: Hospital Recommended Level of Care: [place] Prior Approval Number:    Date Approved/Denied:   PASRR Number: [id number]  Discharge Plan: SNF    Current Diagnoses: Patient Active Problem List   Diagnosis Date Noted  . Status post total left knee replacement 11/27/2014  . Insomnia due to medical condition 10/15/2014  . DDD (degenerative disc disease), lumbar 10/15/2014  . Intractable persistent migraine aura without cerebral infarction and with status migrainosus 10/15/2014  . Arthritis, senescent 10/15/2014  . Preventative health care 08/13/2014  . Periumbilical abdominal pain 06/28/2014  . History of colonic polyps 06/28/2014  . Asthma 05/17/2014  . Obstructive sleep apnea   . Other type of migraine   . DM (diabetes mellitus) type II controlled, neurological manifestation ([place])   . Dyslipidemia 11/12/2008  . Essential hypertension, benign 11/12/2008  . SYNCOPE 11/12/2008    Orientation ACTIVITIES/SOCIAL BLADDER RESPIRATION    Self, Time, Situation, Place    Continent O2 (As needed)  BEHAVIORAL SYMPTOMS/MOOD NEUROLOGICAL BOWEL NUTRITION STATUS      Continent    PHYSICIAN VISITS COMMUNICATION OF NEEDS Height & Weight Skin    Verbally 5' 4.5" (163.8 cm) 225 lbs. Surgical wounds          AMBULATORY STATUS RESPIRATION    Supervision limited O2 (As needed)      Personal Care Assistance Level of Assistance  Bathing, Dressing  Bathing Assistance: Limited assistance   Dressing Assistance: Limited assistance      Functional Limitations Info     Allergies: CONTRAST MEDIA, GADOLINIUM, ASPIRIN, IOHEXOL, TRAZODONE AND NEFAZODONE           SPECIAL CARE FACTORS FREQUENCY  PT (By licensed PT)     PT Frequency: 5X             Additional Factors Info  Code Status Code Status Info: Full             Current Medications (11/29/2014): Current Facility-Administered Medications  Medication Dose Route Frequency Provider Last Rate Last Dose  . 0.45 % NaCl with KCl 20 mEq / L infusion   Intravenous Continuous Lanae Crumbly, PA-C 90 mL/hr at 11/27/14 2310    . acetaminophen (TYLENOL) tablet 650 mg  650 mg Oral Q6H PRN Lanae Crumbly, PA-C       Or  . acetaminophen (TYLENOL) suppository 650 mg  650 mg Rectal Q6H PRN Lanae Crumbly, PA-C      . albuterol (PROVENTIL) (2.5 MG/3ML) 0.083% nebulizer solution 3 mL  3 mL Inhalation Q6H PRN Lanae Crumbly, PA-C      . amitriptyline (ELAVIL) tablet 50 mg  50 mg Oral QHS PRN Lanae Crumbly, PA-C      . apixaban Arne Cleveland) tablet 5 mg  5 mg Oral BID Lanae Crumbly, PA-C      . aspirin EC tablet 325 mg  325 mg Oral Q breakfast Lanae Crumbly, PA-C   325 mg  at 11/28/14 0835  . atorvastatin (LIPITOR) tablet 40 mg  40 mg Oral q1800 Lanae Crumbly, PA-C   40 mg at 11/28/14 1737  . clopidogrel (PLAVIX) tablet 75 mg  75 mg Oral Daily Lanae Crumbly, PA-C   75 mg at 11/29/14 1017  . cycloSPORINE (RESTASIS) 0.05 % ophthalmic emulsion 1 drop  1 drop Both Eyes BID Lanae Crumbly, PA-C   1 drop at 11/29/14 1018  . diltiazem (CARDIZEM CD) 24 hr capsule 180 mg  180 mg Oral Daily Lanae Crumbly, PA-C   180 mg at 11/29/14 1017  . docusate sodium (COLACE) capsule 100 mg  100 mg Oral BID Lanae Crumbly, PA-C   100 mg at 11/29/14 1018  . gabapentin (NEURONTIN) capsule 300 mg  300 mg Oral QHS Lanae Crumbly, PA-C   300 mg at 11/28/14 2254  . HYDROmorphone (DILAUDID) injection 0.5 mg  0.5 mg Intravenous  Q3H PRN Lanae Crumbly, PA-C   0.5 mg at 11/28/14 [id number]  . hyoscyamine (LEVSIN SL) SL tablet 0.125 mg  0.125 mg Oral Q4H PRN Lanae Crumbly, PA-C      . ipratropium (ATROVENT) 0.06 % nasal spray 2 spray  2 spray Each Nare QID Lanae Crumbly, PA-C   2 spray at 11/29/14 1019  . isosorbide mononitrate (IMDUR) 24 hr tablet 60 mg  60 mg Oral Daily Lanae Crumbly, PA-C   60 mg at 11/29/14 1018  . lactated ringers infusion   Intravenous Continuous Duane Boston, MD 50 mL/hr at 11/27/14 1111    . linagliptin (TRADJENTA) tablet 5 mg  5 mg Oral QPC supper Sydney Killings, MD   5 mg at 11/28/14 1736   And  . metFORMIN (GLUCOPHAGE-XR) 24 hr tablet 1,000 mg  1,000 mg Oral QPC supper Sydney Killings, MD   1,000 mg at 11/28/14 1736  . losartan (COZAAR) tablet 100 mg  100 mg Oral Daily Lanae Crumbly, PA-C   100 mg at 11/29/14 1017  . menthol-cetylpyridinium (CEPACOL) lozenge 3 mg  1 lozenge Oral PRN Lanae Crumbly, PA-C       Or  . phenol (CHLORASEPTIC) mouth spray 1 spray  1 spray Mouth/Throat PRN Lanae Crumbly, PA-C      . methocarbamol (ROBAXIN) tablet 500 mg  500 mg Oral Q6H PRN Lanae Crumbly, PA-C   500 mg at 11/29/14 1017   Or  . methocarbamol (ROBAXIN) 500 mg in dextrose 5 % 50 mL IVPB  500 mg Intravenous Q6H PRN Lanae Crumbly, PA-C      . metoCLOPramide (REGLAN) tablet 5-10 mg  5-10 mg Oral Q8H PRN Lanae Crumbly, PA-C       Or  . metoCLOPramide (REGLAN) injection 5-10 mg  5-10 mg Intravenous Q8H PRN Lanae Crumbly, PA-C      . metoCLOPramide (REGLAN) tablet 10 mg  10 mg Oral TID AC & HS Lanae Crumbly, PA-C   10 mg at 11/28/14 2253  . mometasone-formoterol (DULERA) 100-5 MCG/ACT inhaler 2 puff  2 puff Inhalation BID Lanae Crumbly, PA-C   2 puff at 11/29/14 0800  . nitroGLYCERIN (NITROLINGUAL) 0.4 MG/SPRAY spray 1 spray  1 spray Sublingual Q5 min PRN Lanae Crumbly, PA-C      . olopatadine (PATANOL) 0.1 % ophthalmic solution 1 drop  1 drop Both Eyes BID Lanae Crumbly, PA-C   1 drop at 11/29/14 1018  . ondansetron  (ZOFRAN) tablet 4 mg  4  mg Oral Q6H PRN Lanae Crumbly, PA-C       Or  . ondansetron [facility]) injection 4 mg  4 mg Intravenous Q6H PRN Lanae Crumbly, PA-C      . oxyCODONE (Oxy IR/ROXICODONE) immediate release tablet 5-10 mg  5-10 mg Oral Q4H PRN Lanae Crumbly, PA-C   10 mg at 11/29/14 1020  . pantoprazole (PROTONIX) EC tablet 40 mg  40 mg Oral Daily Lanae Crumbly, PA-C   40 mg at 11/29/14 1017  . polyethylene glycol (MIRALAX / GLYCOLAX) packet 17 g  17 g Oral Daily PRN Lanae Crumbly, PA-C      . topiramate (TOPAMAX) tablet 100 mg  100 mg Oral QPM Lanae Crumbly, PA-C   100 mg at 11/28/14 1736  . topiramate (TOPAMAX) tablet 25 mg  25 mg Oral q morning - 10a Lanae Crumbly, PA-C   25 mg at 11/29/14 1017   Do not use this list as official medication orders. Please verify with discharge summary.  Discharge Medications:   Medication List    STOP taking these medications        acetaminophen 325 MG tablet  Commonly known as:  TYLENOL     diazepam 5 MG tablet  Commonly known as:  VALIUM     diclofenac sodium 1 % Gel  Commonly known as:  VOLTAREN     diphenhydrAMINE 25 mg capsule  Commonly known as:  BENADRYL     HYDROcodone-acetaminophen 10-325 MG tablet  Commonly known as:  NORCO     OVER THE COUNTER MEDICATION      TAKE these medications        amitriptyline 50 MG tablet  Commonly known as:  ELAVIL  Take one table by mouth every night at bedtime.     apixaban 5 MG Tabs tablet  Commonly known as:  ELIQUIS  Take 1 tablet (5 mg total) by mouth 2 (two) times daily.     atorvastatin 40 MG tablet  Commonly known as:  LIPITOR  Take 40 mg by mouth daily.     cholecalciferol 1000 UNITS tablet  Commonly known as:  VITAMIN D  Take 1,000 Units by mouth daily.     clopidogrel 75 MG tablet  Commonly known as:  PLAVIX  Take 1 tablet (75 mg total) by mouth daily.     cycloSPORINE 0.05 % ophthalmic emulsion  Commonly known as:  RESTASIS  Place 1 drop into both eyes 2 (two) times  daily.     DEXILANT 60 MG capsule  Generic drug:  dexlansoprazole  TAKE 1 CAPSULE BY MOUTH ONCE DAILY     diltiazem 180 MG 24 hr capsule  Commonly known as:  CARDIZEM CD  TAKE 1 CAPSULE BY MOUTH ONCE DAILY     FIBER SELECT GUMMIES PO  Take 2 capsules by mouth daily.     Fluocinolone Acetonide 0.01 % Oil  Place 2 drops into both ears daily as needed. Does every two weeks     fluticasone-salmeterol 115-21 MCG/ACT inhaler  Commonly known as:  ADVAIR HFA  Inhale 2 puffs into the lungs 2 (two) times daily.     gabapentin 300 MG capsule  Commonly known as:  NEURONTIN  Take 2 capsules of 300 mg each at night by mouth between one and 2 hours prior to bedtime. You can take another 300 mg in daytime for pain.     hyoscyamine 0.125 MG SL tablet  Commonly known as:  LEVSIN SL  PLACE 2 TABLETS UNDER THE TONGUE EVERY 4 HOURS AS NEEDED.     ipratropium 0.06 % nasal spray  Commonly known as:  ATROVENT  Place 2 sprays into both nostrils 4 (four) times daily.     isosorbide mononitrate 60 MG 24 hr tablet  Commonly known as:  IMDUR  Take 1 tablet (60 mg total) by mouth daily.     losartan 100 MG tablet  Commonly known as:  COZAAR  Take 1 tablet (100 mg total) by mouth daily.     methocarbamol 500 MG tablet  Commonly known as:  ROBAXIN  Take 1 tablet (500 mg total) by mouth every 6 (six) hours as needed for muscle spasms.     metoCLOPramide 10 MG tablet  Commonly known as:  REGLAN  Take one tab before each meal and at bedtime     nitroGLYCERIN 0.4 MG/SPRAY spray  Commonly known as:  NITROLINGUAL  Place 1 spray under the tongue every 5 (five) minutes as needed. Chest pains     olopatadine 0.1 % ophthalmic solution  Commonly known as:  PATANOL  Insert 1 drop in both eyes twice daily     oxyCODONE-acetaminophen 5-325 MG tablet  Commonly known as:  ROXICET  Take 1 tablet by mouth every 4 (four) hours as needed for severe pain.     polyethylene glycol packet  Commonly known as:   MIRALAX / GLYCOLAX  Take 17 g by mouth daily as needed for mild constipation.     PROAIR HFA 108 (90 BASE) MCG/ACT inhaler  Generic drug:  albuterol  INHALE 2 PUFFS BY MOUTH EVERY 6 HOURS FOR WHEEZING AND SHORTNESS OF BREATH     Saxagliptin-Metformin 05-998 MG Tb24  Take by mouth. Take one tablet every evening with a meal.     topiramate 25 MG tablet  Commonly known as:  TOPAMAX  TAKE 1 TABLET BY MOUTH EVERY MORNING.     topiramate 100 MG tablet  Commonly known as:  TOPAMAX  TAKE 1 TABLET BY MOUTH EVERY EVENING        Relevant Imaging Results:  Relevant Lab Results:  Recent Labs    Additional Information    Meggin Ola, Jones Broom, LCSWA

## 2014-11-29 NOTE — Progress Notes (Signed)
Pt in no apparent distress. [facility] here for pick up at 8:04pm. Pt given pain meds before d/c. Pt states no questions or concerns at this time. D/c paperwork given. Pt belongings packed up with her.

## 2014-11-29 NOTE — Care Management Important Message (Signed)
Important Message  Patient Details  Name: [name] MRN: [id number] Date of Birth: 02-Jun-1945   Medicare Important Message Given:  Yes    Barb Merino Verina Galeno 11/29/2014, 2:31 PM

## 2014-11-29 NOTE — Discharge Summary (Signed)
Patient ID: [name] MRN: [id number] DOB/AGE: Jun 09, 1945 69 y.o.  Admit date: 11/27/2014 Discharge date: 11/29/2014  Admission Diagnoses:  Active Problems:   Status post total left knee replacement   Discharge Diagnoses:  Active Problems:   Status post total left knee replacement  status post Procedure(s): LEFT TOTAL KNEE ARTHROPLASTY  Past Medical History  Diagnosis Date  . Myocardial infarction [facility]) [place] Multi W/Spect W/Wall Motion EF on 08/07/10 - IMPRESSION: 1) No evidence of ischemia.  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  3) Normal LV wall motion.  4) Estimated Q G S EF 70%, unchanged.  . Asthma   . Blood transfusion   . GERD (gastroesophageal reflux disease)   . Hiatal hernia   . Other forms of migraine, without mention of intractable migraine without mention of status migrainosus   . Dry eye     Chronic  . Hypercholesteremia   . Spondylolisthesis   . Diverticulosis of colon (without mention of hemorrhage)     Colonoscopy 11/09/09 - FINDINGS: 1) 26mm polp in transverse colon, resected & retrieved.  2) Diverticulosis in sigmoid & descending colon.  3) Medium-sized, internal hemorrhoids.  . Internal hemorrhoids without mention of complication     Colonoscopy 11/09/09 - FINDINGS: 1) 30mm polp in transverse colon, resected & retrieved.  2) Diverticulosis in sigmoid & descending colon.  3) Medium-sized, internal hemorrhoids.  . Benign neoplasm of colon 11/09/09    Colonoscopy 11/09/09 - FINDINGS: 1) 7mm polp in transverse colon, resected & retrieved.  2) Diverticulosis in sigmoid & descending colon.  3) Medium-sized, internal hemorrhoids.  . Hemorrhage of rectum and anus     Colonoscopy 11/09/09 - FINDINGS: 1) 68mm polp in transverse colon, resected & retrieved.  2) Diverticulosis in sigmoid & descending colon.  3) Medium-sized, internal hemorrhoids.  . Essential hypertension, benign   .  Esophagitis   . CMC arthritis     Left middle finger  . Depression     40+ years  . Diabetes mellitus 10/2008    type 2  . Breast cancer ([place]) 1989    Left breast cancer  . Obese   . Syncope     MDT LinQ implanted for evaluation  . Facet arthropathy, lumbar     L3-4  . Obstructive sleep apnea     uses oxygen at night  . Allergy   . Anemia   . Arthritis   . Blood transfusion without reported diagnosis   . Cataract   . Glaucoma   . Oxygen deficiency     .2  . Dysrhythmia   . Anginal pain ([place])   . Shortness of breath dyspnea   . Chronic kidney disease     states she has a cyst on her kidney    Surgeries: Procedure(s): LEFT TOTAL KNEE ARTHROPLASTY on 11/27/2014   Consultants: Treatment Team:  Rounding Lbcardiology, MD  Discharged Condition: Improved  Hospital Course: [name] is an 69 y.o. female who was admitted 11/27/2014 for operative treatment of end stage DJD left knee. Patient failed conservative treatments (please see the history and physical for the specifics) and had severe unremitting pain that affects sleep, daily activities and work/hobbies. After pre-op clearance, the patient was taken to the operating room on 11/27/2014 and underwent  Procedure(s): LEFT TOTAL KNEE ARTHROPLASTY.    Patient was given perioperative antibiotics: Anti-infectives    Start     Dose/Rate  Route Frequency Ordered Stop   11/27/14 2000  ceFAZolin (ANCEF) IVPB 1 g/50 mL premix     1 g 100 mL/hr over 30 Minutes Intravenous 3 times per day 11/27/14 1727 11/28/14 0653   11/27/14 1030  ceFAZolin (ANCEF) IVPB 2 g/50 mL premix     2 g 100 mL/hr over 30 Minutes Intravenous To ShortStay Surgical 11/27/14 1012 11/27/14 1301       Patient was given sequential compression devices and early ambulation to prevent DVT.   Patient benefited maximally from hospital stay and there were no complications. At the time of discharge, the patient was urinating/moving their bowels without difficulty,  tolerating a regular diet, pain is controlled with oral pain medications and they have been cleared by PT/OT.   Recent vital signs: Patient Vitals for the past 24 hrs:  BP Temp Temp src Pulse Resp SpO2  11/29/14 0509 129/71 mmHg 99.5 F (37.5 C) Oral (!) 114 18 96 %  11/28/14 2155 112/62 mmHg 99.5 F (37.5 C) Oral (!) 111 16 95 %  11/28/14 1300 121/64 mmHg - Oral - 16 98 %     Recent laboratory studies:  Recent Labs  11/28/14 0750 11/29/14 0415  WBC 13.6* 19.4*  HGB 9.5* 9.5*  HCT 31.0* 29.6*  PLT 257 234  NA 136 137  K 3.8 3.6  CL 104 103  CO2 23 22  BUN <5* <5*  CREATININE 0.78 0.83  GLUCOSE 143* 169*  CALCIUM 8.4* 8.6*     Discharge Medications:     Medication List    STOP taking these medications        acetaminophen 325 MG tablet  Commonly known as:  TYLENOL     diazepam 5 MG tablet  Commonly known as:  VALIUM     diclofenac sodium 1 % Gel  Commonly known as:  VOLTAREN     diphenhydrAMINE 25 mg capsule  Commonly known as:  BENADRYL     HYDROcodone-acetaminophen 10-325 MG tablet  Commonly known as:  NORCO     OVER THE COUNTER MEDICATION      TAKE these medications        amitriptyline 50 MG tablet  Commonly known as:  ELAVIL  Take one table by mouth every night at bedtime.     apixaban 5 MG Tabs tablet  Commonly known as:  ELIQUIS  Take 1 tablet (5 mg total) by mouth 2 (two) times daily.     atorvastatin 40 MG tablet  Commonly known as:  LIPITOR  Take 40 mg by mouth daily.     cholecalciferol 1000 UNITS tablet  Commonly known as:  VITAMIN D  Take 1,000 Units by mouth daily.     clopidogrel 75 MG tablet  Commonly known as:  PLAVIX  Take 1 tablet (75 mg total) by mouth daily.     cycloSPORINE 0.05 % ophthalmic emulsion  Commonly known as:  RESTASIS  Place 1 drop into both eyes 2 (two) times daily.     DEXILANT 60 MG capsule  Generic drug:  dexlansoprazole  TAKE 1 CAPSULE BY MOUTH ONCE DAILY     diltiazem 180 MG 24 hr capsule   Commonly known as:  CARDIZEM CD  TAKE 1 CAPSULE BY MOUTH ONCE DAILY     FIBER SELECT GUMMIES PO  Take 2 capsules by mouth daily.     Fluocinolone Acetonide 0.01 % Oil  Place 2 drops into both ears daily as needed. Does every two weeks  fluticasone-salmeterol 115-21 MCG/ACT inhaler  Commonly known as:  ADVAIR HFA  Inhale 2 puffs into the lungs 2 (two) times daily.     gabapentin 300 MG capsule  Commonly known as:  NEURONTIN  Take 2 capsules of 300 mg each at night by mouth between one and 2 hours prior to bedtime. You can take another 300 mg in daytime for pain.     hyoscyamine 0.125 MG SL tablet  Commonly known as:  LEVSIN SL  PLACE 2 TABLETS UNDER THE TONGUE EVERY 4 HOURS AS NEEDED.     ipratropium 0.06 % nasal spray  Commonly known as:  ATROVENT  Place 2 sprays into both nostrils 4 (four) times daily.     isosorbide mononitrate 60 MG 24 hr tablet  Commonly known as:  IMDUR  Take 1 tablet (60 mg total) by mouth daily.     losartan 100 MG tablet  Commonly known as:  COZAAR  Take 1 tablet (100 mg total) by mouth daily.     methocarbamol 500 MG tablet  Commonly known as:  ROBAXIN  Take 1 tablet (500 mg total) by mouth every 6 (six) hours as needed for muscle spasms.     metoCLOPramide 10 MG tablet  Commonly known as:  REGLAN  Take one tab before each meal and at bedtime     nitroGLYCERIN 0.4 MG/SPRAY spray  Commonly known as:  NITROLINGUAL  Place 1 spray under the tongue every 5 (five) minutes as needed. Chest pains     olopatadine 0.1 % ophthalmic solution  Commonly known as:  PATANOL  Insert 1 drop in both eyes twice daily     oxyCODONE-acetaminophen 5-325 MG tablet  Commonly known as:  ROXICET  Take 1 tablet by mouth every 4 (four) hours as needed for severe pain.     polyethylene glycol packet  Commonly known as:  MIRALAX / GLYCOLAX  Take 17 g by mouth daily as needed for mild constipation.     PROAIR HFA 108 (90 BASE) MCG/ACT inhaler  Generic drug:   albuterol  INHALE 2 PUFFS BY MOUTH EVERY 6 HOURS FOR WHEEZING AND SHORTNESS OF BREATH     Saxagliptin-Metformin 05-998 MG Tb24  Take by mouth. Take one tablet every evening with a meal.     topiramate 25 MG tablet  Commonly known as:  TOPAMAX  TAKE 1 TABLET BY MOUTH EVERY MORNING.     topiramate 100 MG tablet  Commonly known as:  TOPAMAX  TAKE 1 TABLET BY MOUTH EVERY EVENING        Diagnostic Studies: Dg Chest 2 View  11/15/2014  CLINICAL DATA:  Osteoarthritis of the knee.  Essential hypertension. EXAM: CHEST  2 VIEW COMPARISON:  12/21/2013 FINDINGS: Stable cardiopericardial enlargement. Increased density at the apex of the heart is likely fat pad, stable from previous. Implantable loop recorder. There is no edema, consolidation, effusion, or pneumothorax. Bulky spondylosis in the mid thoracic spine. No acute osseous finding. IMPRESSION: No active disease or change from 2015. Electronically Signed   By: Monte Fantasia M.D.   On: 11/15/2014 15:17   Dg Knee 1-2 Views Left  11/27/2014  CLINICAL DATA:  Status post left knee replacement EXAM: LEFT KNEE - 1-2 VIEW COMPARISON:  None. FINDINGS: Left knee prosthesis is noted in satisfactory position. Air is noted in the surgical bed. No other acute soft tissue abnormality is noted. No acute bony abnormality is seen. IMPRESSION: Status post left knee replacement Electronically Signed   By: Inez Catalina  M.D.   On: 11/27/2014 16:29        Discharge Instructions    Call MD / Call 911    Complete by:  As directed   If you experience chest pain or shortness of breath, CALL 911 and be transported to the hospital emergency room.  If you develope a fever above 101 F, pus (white drainage) or increased drainage or redness at the wound, or calf pain, call your surgeon's office.     Constipation Prevention    Complete by:  As directed   Drink plenty of fluids.  Prune juice may be helpful.  You may use a stool softener, such as Colace (over the counter)  100 mg twice a day.  Use MiraLax (over the counter) for constipation as needed.     Diet - low sodium heart healthy    Complete by:  As directed      Increase activity slowly as tolerated    Complete by:  As directed            Follow-up Information    Follow up with CARROLL,DONNA, NP On 12/06/2014.   Specialties:  Nurse Practitioner, Cardiology   Why:  10:00AM   Contact information:   [place] [phone]       Schedule an appointment as soon as possible for a visit with Marybelle Killings, MD.   Specialty:  Orthopedic Surgery   Why:  needs return office visit 2 weeks postop   Contact information:   [place] [address] [phone]       Discharge Plan:  discharge to [facility] SNF  Disposition:     Signed: Lanae Crumbly PA-C For Rodell Perna MD [facility] [phone]  11/29/2014, 10:48 AM

## 2014-11-29 NOTE — Discharge Summary (Deleted)
Patient ID: [name] MRN: [id number] DOB/AGE: May 18, 1945 69 y.o.  Admit date: 11/27/2014 Discharge date: 11/29/2014  Admission Diagnoses:  Active Problems:   Status post total left knee replacement   Discharge Diagnoses:  Active Problems:   Status post total left knee replacement  status post Procedure(s): LEFT TOTAL KNEE ARTHROPLASTY  Past Medical History  Diagnosis Date  . Myocardial infarction [facility]) [place] Multi W/Spect W/Wall Motion EF on 08/07/10 - IMPRESSION: 1) No evidence of ischemia.  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  3) Normal LV wall motion.  4) Estimated Q G S EF 70%, unchanged.  . Asthma   . Blood transfusion   . GERD (gastroesophageal reflux disease)   . Hiatal hernia   . Other forms of migraine, without mention of intractable migraine without mention of status migrainosus   . Dry eye     Chronic  . Hypercholesteremia   . Spondylolisthesis   . Diverticulosis of colon (without mention of hemorrhage)     Colonoscopy 11/09/09 - FINDINGS: 1) 61mm polp in transverse colon, resected & retrieved.  2) Diverticulosis in sigmoid & descending colon.  3) Medium-sized, internal hemorrhoids.  . Internal hemorrhoids without mention of complication     Colonoscopy 11/09/09 - FINDINGS: 1) 7mm polp in transverse colon, resected & retrieved.  2) Diverticulosis in sigmoid & descending colon.  3) Medium-sized, internal hemorrhoids.  . Benign neoplasm of colon 11/09/09    Colonoscopy 11/09/09 - FINDINGS: 1) 37mm polp in transverse colon, resected & retrieved.  2) Diverticulosis in sigmoid & descending colon.  3) Medium-sized, internal hemorrhoids.  . Hemorrhage of rectum and anus     Colonoscopy 11/09/09 - FINDINGS: 1) 61mm polp in transverse colon, resected & retrieved.  2) Diverticulosis in sigmoid & descending colon.  3) Medium-sized, internal hemorrhoids.  . Essential hypertension, benign   .  Esophagitis   . CMC arthritis     Left middle finger  . Depression     40+ years  . Diabetes mellitus 10/2008    type 2  . Breast cancer ([place]) 1989    Left breast cancer  . Obese   . Syncope     MDT LinQ implanted for evaluation  . Facet arthropathy, lumbar     L3-4  . Obstructive sleep apnea     uses oxygen at night  . Allergy   . Anemia   . Arthritis   . Blood transfusion without reported diagnosis   . Cataract   . Glaucoma   . Oxygen deficiency     .2  . Dysrhythmia   . Anginal pain ([place])   . Shortness of breath dyspnea   . Chronic kidney disease     states she has a cyst on her kidney    Surgeries: Procedure(s): LEFT TOTAL KNEE ARTHROPLASTY on 11/27/2014   Consultants: Treatment Team:  Rounding Lbcardiology, MD  Discharged Condition: Improved  Hospital Course: [name] is an 69 y.o. female who was admitted 11/27/2014 for operative treatment of end stage DJD left knee. Patient failed conservative treatments (please see the history and physical for the specifics) and had severe unremitting pain that affects sleep, daily activities and work/hobbies. After pre-op clearance, the patient was taken to the operating room on 11/27/2014 and underwent  Procedure(s): LEFT TOTAL KNEE ARTHROPLASTY.    Patient was given perioperative antibiotics: Anti-infectives    Start     Dose/Rate  Route Frequency Ordered Stop   11/27/14 2000  ceFAZolin (ANCEF) IVPB 1 g/50 mL premix     1 g 100 mL/hr over 30 Minutes Intravenous 3 times per day 11/27/14 1727 11/28/14 0653   11/27/14 1030  ceFAZolin (ANCEF) IVPB 2 g/50 mL premix     2 g 100 mL/hr over 30 Minutes Intravenous To ShortStay Surgical 11/27/14 1012 11/27/14 1301       Patient was given sequential compression devices and early ambulation to prevent DVT.   Patient benefited maximally from hospital stay and there were no complications. At the time of discharge, the patient was urinating/moving their bowels without difficulty,  tolerating a regular diet, pain is controlled with oral pain medications and they have been cleared by PT/OT.   Recent vital signs: Patient Vitals for the past 24 hrs:  BP Temp Temp src Pulse Resp SpO2  11/29/14 0509 129/71 mmHg 99.5 F (37.5 C) Oral (!) 114 18 96 %  11/28/14 2155 112/62 mmHg 99.5 F (37.5 C) Oral (!) 111 16 95 %  11/28/14 1300 121/64 mmHg - Oral - 16 98 %     Recent laboratory studies:  Recent Labs  11/28/14 0750 11/29/14 0415  WBC 13.6* 19.4*  HGB 9.5* 9.5*  HCT 31.0* 29.6*  PLT 257 234  NA 136 137  K 3.8 3.6  CL 104 103  CO2 23 22  BUN <5* <5*  CREATININE 0.78 0.83  GLUCOSE 143* 169*  CALCIUM 8.4* 8.6*     Discharge Medications:     Medication List    STOP taking these medications        acetaminophen 325 MG tablet  Commonly known as:  TYLENOL     diazepam 5 MG tablet  Commonly known as:  VALIUM     diclofenac sodium 1 % Gel  Commonly known as:  VOLTAREN     diphenhydrAMINE 25 mg capsule  Commonly known as:  BENADRYL     HYDROcodone-acetaminophen 10-325 MG tablet  Commonly known as:  NORCO     OVER THE COUNTER MEDICATION      TAKE these medications        amitriptyline 50 MG tablet  Commonly known as:  ELAVIL  Take one table by mouth every night at bedtime.     atorvastatin 40 MG tablet  Commonly known as:  LIPITOR  Take 40 mg by mouth daily.     cholecalciferol 1000 UNITS tablet  Commonly known as:  VITAMIN D  Take 1,000 Units by mouth daily.     clopidogrel 75 MG tablet  Commonly known as:  PLAVIX  Take 1 tablet (75 mg total) by mouth daily.     cycloSPORINE 0.05 % ophthalmic emulsion  Commonly known as:  RESTASIS  Place 1 drop into both eyes 2 (two) times daily.     DEXILANT 60 MG capsule  Generic drug:  dexlansoprazole  TAKE 1 CAPSULE BY MOUTH ONCE DAILY     diltiazem 180 MG 24 hr capsule  Commonly known as:  CARDIZEM CD  TAKE 1 CAPSULE BY MOUTH ONCE DAILY     enoxaparin 30 MG/0.3ML injection  Commonly known  as:  LOVENOX  Inject 0.3 mLs (30 mg total) into the skin every 12 (twelve) hours.     FIBER SELECT GUMMIES PO  Take 2 capsules by mouth daily.     Fluocinolone Acetonide 0.01 % Oil  Place 2 drops into both ears daily as needed. Does every two weeks  fluticasone-salmeterol 115-21 MCG/ACT inhaler  Commonly known as:  ADVAIR HFA  Inhale 2 puffs into the lungs 2 (two) times daily.     gabapentin 300 MG capsule  Commonly known as:  NEURONTIN  Take 2 capsules of 300 mg each at night by mouth between one and 2 hours prior to bedtime. You can take another 300 mg in daytime for pain.     hyoscyamine 0.125 MG SL tablet  Commonly known as:  LEVSIN SL  PLACE 2 TABLETS UNDER THE TONGUE EVERY 4 HOURS AS NEEDED.     ipratropium 0.06 % nasal spray  Commonly known as:  ATROVENT  Place 2 sprays into both nostrils 4 (four) times daily.     isosorbide mononitrate 60 MG 24 hr tablet  Commonly known as:  IMDUR  Take 1 tablet (60 mg total) by mouth daily.     losartan 100 MG tablet  Commonly known as:  COZAAR  Take 1 tablet (100 mg total) by mouth daily.     methocarbamol 500 MG tablet  Commonly known as:  ROBAXIN  Take 1 tablet (500 mg total) by mouth every 6 (six) hours as needed for muscle spasms.     metoCLOPramide 10 MG tablet  Commonly known as:  REGLAN  Take one tab before each meal and at bedtime     nitroGLYCERIN 0.4 MG/SPRAY spray  Commonly known as:  NITROLINGUAL  Place 1 spray under the tongue every 5 (five) minutes as needed. Chest pains     olopatadine 0.1 % ophthalmic solution  Commonly known as:  PATANOL  Insert 1 drop in both eyes twice daily     oxyCODONE-acetaminophen 5-325 MG tablet  Commonly known as:  ROXICET  Take 1 tablet by mouth every 4 (four) hours as needed for severe pain.     polyethylene glycol packet  Commonly known as:  MIRALAX / GLYCOLAX  Take 17 g by mouth daily as needed for mild constipation.     PROAIR HFA 108 (90 BASE) MCG/ACT inhaler   Generic drug:  albuterol  INHALE 2 PUFFS BY MOUTH EVERY 6 HOURS FOR WHEEZING AND SHORTNESS OF BREATH     Saxagliptin-Metformin 05-998 MG Tb24  Take by mouth. Take one tablet every evening with a meal.     topiramate 25 MG tablet  Commonly known as:  TOPAMAX  TAKE 1 TABLET BY MOUTH EVERY MORNING.     topiramate 100 MG tablet  Commonly known as:  TOPAMAX  TAKE 1 TABLET BY MOUTH EVERY EVENING        Diagnostic Studies: Dg Chest 2 View  11/15/2014  CLINICAL DATA:  Osteoarthritis of the knee.  Essential hypertension. EXAM: CHEST  2 VIEW COMPARISON:  12/21/2013 FINDINGS: Stable cardiopericardial enlargement. Increased density at the apex of the heart is likely fat pad, stable from previous. Implantable loop recorder. There is no edema, consolidation, effusion, or pneumothorax. Bulky spondylosis in the mid thoracic spine. No acute osseous finding. IMPRESSION: No active disease or change from 2015. Electronically Signed   By: Monte Fantasia M.D.   On: 11/15/2014 15:17   Dg Knee 1-2 Views Left  11/27/2014  CLINICAL DATA:  Status post left knee replacement EXAM: LEFT KNEE - 1-2 VIEW COMPARISON:  None. FINDINGS: Left knee prosthesis is noted in satisfactory position. Air is noted in the surgical bed. No other acute soft tissue abnormality is noted. No acute bony abnormality is seen. IMPRESSION: Status post left knee replacement Electronically Signed   By: Inez Catalina  M.D.   On: 11/27/2014 16:29        Discharge Instructions    Call MD / Call 911    Complete by:  As directed   If you experience chest pain or shortness of breath, CALL 911 and be transported to the hospital emergency room.  If you develope a fever above 101 F, pus (white drainage) or increased drainage or redness at the wound, or calf pain, call your surgeon's office.     Constipation Prevention    Complete by:  As directed   Drink plenty of fluids.  Prune juice may be helpful.  You may use a stool softener, such as Colace  (over the counter) 100 mg twice a day.  Use MiraLax (over the counter) for constipation as needed.     Diet - low sodium heart healthy    Complete by:  As directed      Increase activity slowly as tolerated    Complete by:  As directed            Follow-up Information    Follow up with CARROLL,DONNA, NP On 12/06/2014.   Specialties:  Nurse Practitioner, Cardiology   Why:  10:00AM   Contact information:   [place] [phone]       Schedule an appointment as soon as possible for a visit with Marybelle Killings, MD.   Specialty:  Orthopedic Surgery   Why:  needs return office visit 2 weeks postop   Contact information:   [place] [address] [phone]       Discharge Plan:  discharge to [facility] SN[facility]      Signed: [place] MD [facility] [phone] 11/29/2014, 10:38 AM

## 2014-11-29 NOTE — Clinical Social Work Note (Addendum)
Patient to be d/c'ed today to [facility] SN[facility].  Patient and family agreeable to plans will transport via ems RN to call report.  Thurnell Lose patient's relative [phone] notified about transfer and message left on her son's phone. 4:30 CSW received phone call from patient's son who is aware that patient will be discharging to [place] today, informed him that Corey Harold has been called and she is on the list to be transferred.  Evette Cristal, MSW, [place]

## 2014-11-29 NOTE — Discharge Instructions (Addendum)
You have an appointment set up with the [facility].  Multiple studies have shown that being followed by a dedicated atrial fibrillation clinic in addition to the standard care you receive from your other physicians improves health. We believe that enrollment in the atrial fibrillation clinic will allow [place] to better care for you.   The phone number to the [facility] is [phone]. The clinic is staffed Monday through Friday from 8:30am to 5pm.  Parking Directions: The clinic is located in the Heart and Vascular Building connected to [facility]. 1)From [address] turn on to [address] and go to the 3rd entrance  (Heart and Vascular entrance) on the right. 2)Look to the right for Heart &Vascular Parking Garage. 3)A code for the entrance is required please call the clinic to receive this.   4)Take the elevators to the 1st floor. Registration is in the room with the glass walls at the end of the hallway.  If you have any trouble parking or locating the clinic, please dont hesitate to call [phone].   ORTHOPEDIC DISCHARGE INSTRUCTIONS INSTRUCTIONS AFTER JOINT REPLACEMENT   o Remove items at home which could result in a fall. This includes throw rugs or furniture in walking pathways o ICE to the affected joint every three hours while awake for 30 minutes at a time, for at least the first 3-5 days, and then as needed for pain and swelling.  Continue to use ice for pain and swelling. You may notice swelling that will progress down to the foot and ankle.  This is normal after surgery.  Elevate your leg when you are not up walking on it.   o Continue to use the breathing machine you got in the hospital (incentive spirometer) which will help keep your temperature down.  It is common for your temperature to cycle up and down following surgery, especially at night when you are not up moving around and exerting yourself.  The breathing machine keeps your  lungs expanded and your temperature down.   DIET:  As you were doing prior to hospitalization, we recommend a well-balanced diet.  DRESSING / WOUND CARE / SHOWERING  You may change your dressing 3-5 days after surgery.  Then change the dressing every day with sterile gauze.  Please use good hand washing techniques before changing the dressing.  Do not use any lotions or creams on the incision until instructed by your surgeon.  ACTIVITY  o Increase activity slowly as tolerated, but follow the weight bearing instructions below.   o No driving for 6 weeks or until further direction given by your physician.  You cannot drive while taking narcotics.  o No lifting or carrying greater than 10 lbs. until further directed by your surgeon. o Avoid periods of inactivity such as sitting longer than an hour when not asleep. This helps prevent blood clots.  o You may return to work once you are authorized by your doctor.     WEIGHT BEARING   Weight bearing as tolerated with assist device (walker, cane, etc) as directed, use it as long as suggested by your surgeon or therapist, typically at least 4-6 weeks.   EXERCISES  Results after joint replacement surgery are often greatly improved when you follow the exercise, range of motion and muscle strengthening exercises prescribed by your doctor. Safety measures are also important to protect the joint from further injury. Any time any of these exercises cause you to have increased pain or swelling, decrease  what you are doing until you are comfortable again and then slowly increase them. If you have problems or questions, call your caregiver or physical therapist for advice.   Rehabilitation is important following a joint replacement. After just a few days of immobilization, the muscles of the leg can become weakened and shrink (atrophy).  These exercises are designed to build up the tone and strength of the thigh and leg muscles and to improve motion. Often  times heat used for twenty to thirty minutes before working out will loosen up your tissues and help with improving the range of motion but do not use heat for the first two weeks following surgery (sometimes heat can increase post-operative swelling).   These exercises can be done on a training (exercise) mat, on the floor, on a table or on a bed. Use whatever works the best and is most comfortable for you.    Use music or television while you are exercising so that the exercises are a pleasant break in your day. This will make your life better with the exercises acting as a break in your routine that you can look forward to.   Perform all exercises about fifteen times, three times per day or as directed.  You should exercise both the operative leg and the other leg as well.  Exercises include:    Quad Sets - Tighten up the muscle on the front of the thigh (Quad) and hold for 5-10 seconds.    Straight Leg Raises - With your knee straight (if you were given a brace, keep it on), lift the leg to 60 degrees, hold for 3 seconds, and slowly lower the leg.  Perform this exercise against resistance later as your leg gets stronger.   Leg Slides: Lying on your back, slowly slide your foot toward your buttocks, bending your knee up off the floor (only go as far as is comfortable). Then slowly slide your foot back down until your leg is flat on the floor again.   Angel Wings: Lying on your back spread your legs to the side as far apart as you can without causing discomfort.   Hamstring Strength:  Lying on your back, push your heel against the floor with your leg straight by tightening up the muscles of your buttocks.  Repeat, but this time bend your knee to a comfortable angle, and push your heel against the floor.  You may put a pillow under the heel to make it more comfortable if necessary.   A rehabilitation program following joint replacement surgery can speed recovery and prevent re-injury in the future  due to weakened muscles. Contact your doctor or a physical therapist for more information on knee rehabilitation.    CONSTIPATION  Constipation is defined medically as fewer than three stools per week and severe constipation as less than one stool per week.  Even if you have a regular bowel pattern at home, your normal regimen is likely to be disrupted due to multiple reasons following surgery.  Combination of anesthesia, postoperative narcotics, change in appetite and fluid intake all can affect your bowels.   YOU MUST use at least one of the following options; they are listed in order of increasing strength to get the job done.  They are all available over the counter, and you may need to use some, POSSIBLY even all of these options:    Drink plenty of fluids (prune juice may be helpful) and high fiber foods Colace 100 mg by mouth  twice a day  Senokot for constipation as directed and as needed Dulcolax (bisacodyl), take with full glass of water  Miralax (polyethylene glycol) once or twice a day as needed.  If you have tried all these things and are unable to have a bowel movement in the first 3-4 days after surgery call either your surgeon or your primary doctor.    If you experience loose stools or diarrhea, hold the medications until you stool forms back up.  If your symptoms do not get better within 1 week or if they get worse, check with your doctor.  If you experience "the worst abdominal pain ever" or develop nausea or vomiting, please contact the office immediately for further recommendations for treatment.   ITCHING:  If you experience itching with your medications, try taking only a single pain pill, or even half a pain pill at a time.  You can also use Benadryl over the counter for itching or also to help with sleep.   TED HOSE STOCKINGS:  Use stockings on both legs until for at least 2 weeks or as directed by physician office. They may be removed at night for  sleeping.  MEDICATIONS:  See your medication summary on the After Visit Summary that nursing will review with you.  You may have some home medications which will be placed on hold until you complete the course of blood thinner medication.  It is important for you to complete the blood thinner medication as prescribed.  PRECAUTIONS:  If you experience chest pain or shortness of breath - call 911 immediately for transfer to the hospital emergency department.   If you develop a fever greater that 101 F, purulent drainage from wound, increased redness or drainage from wound, foul odor from the wound/dressing, or calf pain - CONTACT YOUR SURGEON.                                                   FOLLOW-UP APPOINTMENTS:  If you do not already have a post-op appointment, please call the office for an appointment to be seen by your surgeon.  Guidelines for how soon to be seen are listed in your After Visit Summary, but are typically between 1-4 weeks after surgery.  OTHER INSTRUCTIONS:   Knee Replacement:  Do not place pillow under knee, focus on keeping the knee straight while resting. CPM instructions: 0-90 degrees, 2 hours in the morning, 2 hours in the afternoon, and 2 hours in the evening. Place foam block, curve side up under heel at all times except when in CPM or when walking.  DO NOT modify, tear, cut, or change the foam block in any way.  MAKE SURE YOU:   Understand these instructions.   Get help right away if you are not doing well or get worse.    Thank you for letting us be a part of your medical care team.  It is a privilege we respect greatly.  We hope these instructions will help you stay on track for a fast and full recovery!   Information on my medicine - ELIQUIS (apixaban)  This medication education was reviewed with me or my healthcare representative as part of my discharge preparation.  The pharmacist that spoke with me during my hospital stay was:  Romona Curls,  [facility]  Why was Eliquis prescribed for you?  Eliquis was prescribed for you to reduce the risk of a blood clot forming that can cause a stroke if you have a medical condition called atrial fibrillation (a type of irregular heartbeat).  What do You need to know about Eliquis ? Take your Eliquis TWICE DAILY - one tablet in the morning and one tablet in the evening with or without food. If you have difficulty swallowing the tablet whole please discuss with your pharmacist how to take the medication safely.  Take Eliquis exactly as prescribed by your doctor and DO NOT stop taking Eliquis without talking to the doctor who prescribed the medication.  Stopping may increase your risk of developing a stroke.  Refill your prescription before you run out.  After discharge, you should have regular check-up appointments with your healthcare provider that is prescribing your Eliquis.  In the future your dose may need to be changed if your kidney function or weight changes by a significant amount or as you get older.  What do you do if you miss a dose? If you miss a dose, take it as soon as you remember on the same day and resume taking twice daily.  Do not take more than one dose of ELIQUIS at the same time to make up a missed dose.  Important Safety Information A possible side effect of Eliquis is bleeding. You should call your healthcare provider right away if you experience any of the following: ? Bleeding from an injury or your nose that does not stop. ? Unusual colored urine (red or dark brown) or unusual colored stools (red or black). ? Unusual bruising for unknown reasons. ? A serious fall or if you hit your head (even if there is no bleeding).  Some medicines may interact with Eliquis and might increase your risk of bleeding or clotting while on Eliquis. To help avoid this, consult your healthcare provider or pharmacist prior to using any new prescription or non-prescription medications,  including herbals, vitamins, non-steroidal anti-inflammatory drugs (NSAIDs) and supplements.  This website has more information on Eliquis (apixaban): http://www.eliquis.com/eliquis/home

## 2014-11-29 NOTE — Care Management Note (Signed)
Case Management Note  Patient Details  Name: [name] MRN: [id number] Date of Birth: May 13, 1945  Subjective/Objective:   69 yr old female s/p left total knee arthroplasty.              Action/Plan: Patient is for shortterm rehab at [facility]. Social worker is arranging.   Expected Discharge Date:   11/29/14               Expected Discharge Plan:   Skilled Nursing Facility  In-House Referral:  Clinical Social Work  Discharge planning Services  NA  Post Acute Care Choice:    Choice offered to:  NA  DME Arranged:  N/A DME Agency:  NA  HH Arranged:    [place] Agency:     Status of Service:  Completed, signed off  Medicare Important Message Given:    Date Medicare IM Given:    Medicare IM give by:    Date Additional Medicare IM Given:    Additional Medicare Important Message give by:     If discussed at [place] of Stay Meetings, dates discussed:    Additional Comments:  Ninfa Meeker, RN 11/29/2014, 10:06 AM

## 2014-11-29 NOTE — Progress Notes (Signed)
Physical Therapy Treatment Patient Details Name: [name] MRN: [id number] DOB: 1945-03-30 Today's Date: 11/29/2014    History of Present Illness 69 y.o. female s/p L TKA.    PT Comments    Progressing gradually toward mobility goals as evident by ability to ambulate 17ft and perform three sit to stands with moderate/maxi assistance. Continue with PT plan to increase independence and safety with mobility in anticipation of d/c to SNF at [facility].   Follow Up Recommendations  SNF;Supervision/Assistance - 24 hour     Equipment Recommendations  None recommended by PT    Recommendations for Other Services       Precautions / Restrictions Precautions Precautions: Knee;Fall Required Braces or Orthoses: Knee Immobilizer - Left Knee Immobilizer - Left: On except when in CPM Restrictions Weight Bearing Restrictions: Yes LLE Weight Bearing: Weight bearing as tolerated    Mobility  Bed Mobility                  Transfers Overall transfer level: Needs assistance Equipment used: Rolling walker (2 wheeled) Transfers: Sit to/from Stand (from bed, BSC, and recliner) Sit to Stand: Mod assist;Max assist;+2 physical assistance         General transfer comment: requires 2 assist for standing and safety upon standing; max verbal cues and tactile cues for hand placement and weight shifting to advance bilateral LE   Ambulation/Gait Ambulation/Gait assistance: Mod assist;+2 safety/equipment Ambulation Distance (Feet): 10 Feet Assistive device: Rolling walker (2 wheeled) Gait Pattern/deviations: Step-to pattern;Decreased stride length;Decreased weight shift to right;Decreased weight shift to left Gait velocity: decreased   General Gait Details: difficulty weight shifting R and L to offload LE for advancement; verbal and tactile cues for sequencing of gait with AD   Stairs            Wheelchair Mobility    Modified Rankin (Stroke Patients Only)       Balance  Overall balance assessment: Needs assistance Sitting-balance support: Bilateral upper extremity supported;Feet supported Sitting balance-Leahy Scale: Poor   Postural control: Other (comment) (trunk flexed) Standing balance support: Bilateral upper extremity supported Standing balance-Leahy Scale: Poor                      Cognition Arousal/Alertness: Lethargic;Suspect due to medications Behavior During Therapy: WFL for tasks assessed/performed Overall Cognitive Status: Within Functional Limits for tasks assessed                      Exercises      General Comments General comments (skin integrity, edema, etc.): Pt required verbal cues to maintain COG over BOS in standing and becomes fatigued quickly.      Pertinent Vitals/Pain Pain Assessment: 0-10 Pain Score: 8  Pain Location: Lt knee and back Pain Descriptors / Indicators: Aching Pain Intervention(s): Limited activity within patient's tolerance;Monitored during session    Home Living                      Prior Function            PT Goals (current goals can now be found in the care plan section) Acute Rehab PT Goals Patient Stated Goal: return to independence  PT Goal Formulation: With patient Time For Goal Achievement: 12/12/14 Potential to Achieve Goals: Good Progress towards PT goals: Progressing toward goals    Frequency  7X/week    PT Plan Current plan remains appropriate    Co-evaluation  End of Session Equipment Utilized During Treatment: Gait belt Activity Tolerance: Patient limited by fatigue Patient left: in chair;with call bell/phone within reach;with family/visitor present     Time: 1345-1415 PT Time Calculation (min) (ACUTE ONLY): 30 min  Charges:  $Gait Training: 8-22 mins $Therapeutic Activity: 8-22 mins                    G CodesDarliss [name], PTA [phone] 11/29/2014, 3:34 PM

## 2014-11-29 NOTE — Progress Notes (Signed)
Subjective: 2 Days Post-Op Procedure(s) (LRB): LEFT TOTAL KNEE ARTHROPLASTY (Left) Patient reports pain as moderate.    Objective: Vital signs in last 24 hours: Temp:  [99.5 F (37.5 C)] 99.5 F (37.5 C) (11/11 0509) Pulse Rate:  [111-114] 114 (11/11 0509) Resp:  [16-18] 18 (11/11 0509) BP: (112-129)/(62-71) 129/71 mmHg (11/11 0509) SpO2:  [95 %-98 %] 96 % (11/11 0509)  Intake/Output from previous day: 11/10 0701 - 11/11 0700 In: 960 [P.O.:960] Out: -  Intake/Output this shift:     Recent Labs  11/27/14 1040 11/28/14 0750 11/29/14 0415  HGB 10.7* 9.5* 9.5*    Recent Labs  11/28/14 0750 11/29/14 0415  WBC 13.6* 19.4*  RBC 3.63* 3.54*  HCT 31.0* 29.6*  PLT 257 234    Recent Labs  11/28/14 0750 11/29/14 0415  NA 136 137  K 3.8 3.6  CL 104 103  CO2 23 22  BUN <5* <5*  CREATININE 0.78 0.83  GLUCOSE 143* 169*  CALCIUM 8.4* 8.6*   No results for input(s): LABPT, INR in the last 72 hours.  Neurologically intact  Assessment/Plan: 2 Days Post-Op Procedure(s) (LRB): LEFT TOTAL KNEE ARTHROPLASTY (Left) Discharge to SNF  [name] C 11/29/2014, 9:46 AM

## 2014-11-29 NOTE — Clinical Social Work Placement (Signed)
   CLINICAL SOCIAL WORK PLACEMENT  NOTE  Date:  11/29/2014  Patient Details  Name: [name] MRN: [id number] Date of Birth: 08/12/1945  Clinical Social Work is seeking post-discharge placement for this patient at the [place] level of care (*CSW will initial, date and re-position this form in  chart as items are completed):  No   Patient/family provided with [place] Work Department's list of facilities offering this level of care within the geographic area requested by the patient (or if unable, by the patient's family).  Yes   Patient/family informed of their freedom to choose among providers that offer the needed level of care, that participate in Medicare, Medicaid or managed care program needed by the patient, have an available bed and are willing to accept the patient.  Yes   Patient/family informed of [facility]'s ownership interest in [facility] and [facility], as well as of the fact that they are under no obligation to receive care at these facilities.  PASRR submitted to EDS on 11/29/14     PASRR number received on 11/29/14     Existing PASRR number confirmed on 11/29/14     FL2 transmitted to all facilities in geographic area requested by pt/family on 11/29/14     FL2 transmitted to all facilities within larger geographic area on       Patient informed that his/her managed care company has contracts with or will negotiate with certain facilities, including the following:        Yes   Patient/family informed of bed offers received.  Patient chooses bed at [facility] [place] recommends and patient chooses bed at      Patient to be transferred to [facility] [place] on 11/29/14.  Patient to be transferred to facility by [facility]     Patient family notified on 11/29/14 of transfer.  Name of family member notified:  Thurnell Lose patient's relative [phone]     PHYSICIAN       Additional  Comment:    _______________________________________________ Ross Ludwig, LCSWA 11/29/2014, 3:19 PM

## 2014-11-29 NOTE — Progress Notes (Signed)
*  PRELIMINARY RESULTS* Echocardiogram 2D Echocardiogram has been performed.  Leavy Cella 11/29/2014, 12:23 PM

## 2014-12-02 DIAGNOSIS — M545 Low back pain: Secondary | ICD-10-CM | POA: Diagnosis not present

## 2014-12-02 DIAGNOSIS — R2689 Other abnormalities of gait and mobility: Secondary | ICD-10-CM | POA: Diagnosis not present

## 2014-12-02 DIAGNOSIS — M5387 Other specified dorsopathies, lumbosacral region: Secondary | ICD-10-CM | POA: Diagnosis not present

## 2014-12-03 DIAGNOSIS — Z471 Aftercare following joint replacement surgery: Secondary | ICD-10-CM | POA: Diagnosis not present

## 2014-12-03 DIAGNOSIS — E119 Type 2 diabetes mellitus without complications: Secondary | ICD-10-CM | POA: Diagnosis not present

## 2014-12-03 DIAGNOSIS — E785 Hyperlipidemia, unspecified: Secondary | ICD-10-CM | POA: Diagnosis not present

## 2014-12-03 DIAGNOSIS — I10 Essential (primary) hypertension: Secondary | ICD-10-CM | POA: Diagnosis not present

## 2014-12-03 NOTE — Telephone Encounter (Signed)
EP saw patient while admitted per notes.  AF clinic appointment scheduled during admission for 12/06/14 at 10:00am.

## 2014-12-04 DIAGNOSIS — M25562 Pain in left knee: Secondary | ICD-10-CM | POA: Diagnosis not present

## 2014-12-04 DIAGNOSIS — D558 Other anemias due to enzyme disorders: Secondary | ICD-10-CM | POA: Diagnosis not present

## 2014-12-05 ENCOUNTER — Ambulatory Visit (INDEPENDENT_AMBULATORY_CARE_PROVIDER_SITE_OTHER): Payer: Medicare Other | Admitting: *Deleted

## 2014-12-05 DIAGNOSIS — R55 Syncope and collapse: Secondary | ICD-10-CM | POA: Diagnosis not present

## 2014-12-06 ENCOUNTER — Inpatient Hospital Stay (HOSPITAL_COMMUNITY): Admit: 2014-12-06 | Payer: Self-pay | Admitting: Nurse Practitioner

## 2014-12-06 NOTE — Progress Notes (Signed)
Carelink Summary Report / Loop Recorder 

## 2014-12-09 DIAGNOSIS — M25562 Pain in left knee: Secondary | ICD-10-CM | POA: Diagnosis not present

## 2014-12-09 DIAGNOSIS — R2689 Other abnormalities of gait and mobility: Secondary | ICD-10-CM | POA: Diagnosis not present

## 2014-12-10 DIAGNOSIS — M25561 Pain in right knee: Secondary | ICD-10-CM | POA: Diagnosis not present

## 2014-12-16 ENCOUNTER — Inpatient Hospital Stay (HOSPITAL_COMMUNITY): Admission: RE | Admit: 2014-12-16 | Payer: Self-pay | Source: Ambulatory Visit | Admitting: Nurse Practitioner

## 2014-12-16 DIAGNOSIS — M25562 Pain in left knee: Secondary | ICD-10-CM | POA: Diagnosis not present

## 2014-12-16 DIAGNOSIS — R2689 Other abnormalities of gait and mobility: Secondary | ICD-10-CM | POA: Diagnosis not present

## 2014-12-19 ENCOUNTER — Encounter: Payer: Self-pay | Admitting: Cardiology

## 2014-12-19 ENCOUNTER — Ambulatory Visit (HOSPITAL_COMMUNITY)
Admission: RE | Admit: 2014-12-19 | Discharge: 2014-12-19 | Disposition: A | Discharge status: Home / Self Care | Payer: Medicare Other | Source: Ambulatory Visit | Attending: Nurse Practitioner | Admitting: Nurse Practitioner

## 2014-12-19 VITALS — BP 112/70 | HR 98 | Ht 64.0 in | Wt 216.0 lb

## 2014-12-19 DIAGNOSIS — R55 Syncope and collapse: Secondary | ICD-10-CM | POA: Insufficient documentation

## 2014-12-19 DIAGNOSIS — I480 Paroxysmal atrial fibrillation: Secondary | ICD-10-CM | POA: Diagnosis not present

## 2014-12-19 DIAGNOSIS — I2510 Atherosclerotic heart disease of native coronary artery without angina pectoris: Secondary | ICD-10-CM | POA: Diagnosis not present

## 2014-12-19 DIAGNOSIS — I10 Essential (primary) hypertension: Secondary | ICD-10-CM | POA: Insufficient documentation

## 2014-12-19 LAB — CBC
HEMATOCRIT: 30.4 % — AB (ref 36.0–46.0)
HEMOGLOBIN: 9 g/dL — AB (ref 12.0–15.0)
MCH: 25.6 pg — ABNORMAL LOW (ref 26.0–34.0)
MCHC: 29.6 g/dL — ABNORMAL LOW (ref 30.0–36.0)
MCV: 86.6 fL (ref 78.0–100.0)
Platelets: 518 10*3/uL — ABNORMAL HIGH (ref 150–400)
RBC: 3.51 MIL/uL — ABNORMAL LOW (ref 3.87–5.11)
RDW: 17 % — ABNORMAL HIGH (ref 11.5–15.5)
WBC: 11.1 10*3/uL — AB (ref 4.0–10.5)

## 2014-12-20 ENCOUNTER — Encounter (HOSPITAL_COMMUNITY): Payer: Self-pay | Admitting: Nurse Practitioner

## 2014-12-20 ENCOUNTER — Other Ambulatory Visit (HOSPITAL_COMMUNITY): Payer: Self-pay | Admitting: *Deleted

## 2014-12-20 DIAGNOSIS — I480 Paroxysmal atrial fibrillation: Secondary | ICD-10-CM

## 2014-12-20 NOTE — Progress Notes (Signed)
Patient ID: [name], female   DOB: 1945/06/03, 69 y.o.   MRN: [id number]     Primary Care Physician: Hoyt Koch, MD Referring Physician: Hospital f/u Electrophysiologist: Dr.Taylor   [name] is a 69 y.o. female with a h/o S/p L knee surgery, 11/9. She is pt of Dr. Lovena Le with with a loop recorder implanted for evaluation of recurrent syncope. The patient was noted via her linq device that she had an episode of PAfib on Oct 6th lasting 12 minutes. When the office called the patient to notify her of the findings and discuss a plan of care with her it was noted she was currently in the hospital s/p knee surgery. The patient has  long history of occasional palpitations, and historically with other monitoring up until now has not disclosed arrhythmias. She reports her last syncopal episode about 8 months ago or so, no near syncope or dizzy spells. In discussion with the patient the episode of AF did not correlate with any particular symptoms that she can recall.   The patients CHADS2vasc score is at least 4 with indication for anticoagulation. The patient states historically she had an ulcer that bled some years ago, she reports felt secondary to her arthritis medicine at the time. ASA is listed as an allergy she reports gives her palpitations but no other reaction. She is currently on Plavix (no ASA) at home without bleeding she reports. 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   She is afib clinic today for f/u of the above consult by Dr. Curt Bears and start of eliquis 5 mg bid. Decision was made to continue plavix. Pt was just discharged this am from the rehab hospital and will return home after this visit. She has not noticed any issues since starting the blood thinner. She is not aware of any irregular heart beat. Rehab of left knee coming along  nicely.  Today, she denies symptoms of palpitations, chest pain, shortness of breath, orthopnea, PND, lower extremity edema, dizziness, presyncope, syncope, or neurologic sequela. The patient is tolerating medications without difficulties and is otherwise without complaint today.   Past Medical History  Diagnosis Date  . Myocardial infarction [facility]) [place] Multi W/Spect W/Wall Motion EF on 08/07/10 - IMPRESSION: 1) No evidence of ischemia.  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  3) Normal LV wall motion.  4) Estimated Q G S EF 70%, unchanged.  . Asthma   . Blood transfusion   . GERD (gastroesophageal reflux disease)   . Hiatal hernia   . Other forms of migraine, without mention of intractable migraine without mention of status migrainosus   . Dry eye     Chronic  . Hypercholesteremia   . Spondylolisthesis   . Diverticulosis of colon (without mention of hemorrhage)     Colonoscopy 11/09/09 - FINDINGS: 1) 38mm polp in transverse colon, resected & retrieved.  2) Diverticulosis in sigmoid & descending colon.  3) Medium-sized, internal hemorrhoids.  . Internal hemorrhoids without mention of complication     Colonoscopy 11/09/09 - FINDINGS: 1) 22mm polp in transverse colon, resected & retrieved.  2) Diverticulosis in sigmoid & descending colon.  3) Medium-sized, internal hemorrhoids.  . Benign neoplasm of colon 11/09/09    Colonoscopy 11/09/09 - FINDINGS: 1) 52mm polp in transverse colon, resected &  retrieved.  2) Diverticulosis in sigmoid & descending colon.  3) Medium-sized, internal hemorrhoids.  . Hemorrhage of rectum and anus     Colonoscopy 11/09/09 - FINDINGS: 1) 32mm polp in transverse colon, resected & retrieved.  2) Diverticulosis in sigmoid & descending colon.  3) Medium-sized, internal hemorrhoids.  . Essential hypertension, benign   . Esophagitis   . CMC arthritis     Left middle finger  . Depression     40+ years  .  Diabetes mellitus 10/2008    type 2  . Breast cancer ([place]) 1989    Left breast cancer  . Obese   . Syncope     MDT LinQ implanted for evaluation  . Facet arthropathy, lumbar     L3-4  . Obstructive sleep apnea     uses oxygen at night  . Allergy   . Anemia   . Arthritis   . Blood transfusion without reported diagnosis   . Cataract   . Glaucoma   . Oxygen deficiency     .2  . Dysrhythmia   . Anginal pain ([place])   . Shortness of breath dyspnea   . Chronic kidney disease     states she has a cyst on her kidney   Past Surgical History  Procedure Laterality Date  . Abdominal hysterectomy  1978  . Joint replacement      lsft hip x2, rt knee  . Knee arthroscopy Right 2002    By Dr. Lorin Mercy  . Foot surgery Bilateral     Bil feet great toes and second toes spurs. By Dr. Milinda Pointer in 2004.  . Breast biopsy Left 1995    Biopsy at [facility] ([facility]  . Tonsillectomy    . Back surgery      x2 one for infection  . Cholecystectomy    . Total hip revision  12/14/2010    Procedure: TOTAL HIP REVISION;  Surgeon: Marybelle Killings;  Location: [place];  Service: Orthopedics;  Laterality: Left;  Left Total Hip Arthroplasty Revision, Poly and Ball Exchange, Possible Acetabular Revision vs. Poly Exchange  . Colonoscopy w/ polypectomy  11/09/09    FINDINGS: 1) 34mm polp in transverse colon, resected & retrieved.  2) Diverticulosis in sigmoid & descending colon.  3) Medium-sized, internal hemorrhoids.  . Loop recorder implant  11/15/06; 04/02/2013    MDT Reveal implanted 10/2006 for syncope; removed with MDT LinQ implanted 03/2013 by Dr Lovena Le  . Hemorrhoid surgery  2011    Internal & external hemorrhoidectomy (x) 3 quadrants  . Esophagogastroduodenoscopy  02/15/07  . Dilation and curettage of uterus    . Trigger finger release Left 01/02/2013    Procedure: RELEASE LEFT MIDDLE TRIGGER FINGER/LEFT INDEX AND LEFT RING FINGER;  Surgeon: Wynonia Sours, MD;  Location: [place];   Service: Orthopedics;  Laterality: Left;  ANESTHESIA: GENERAL/IV REGIONAL FAB  . Loop recorder implant N/A 04/02/2013    Procedure: LOOP RECORDER IMPLANT;  Surgeon: Evans Lance, MD;  Location: [facility] CATH LAB;  Service: Cardiovascular;  Laterality: N/A;  . Toe surgery    . Colonoscopy    . Total knee arthroplasty Left 11/27/2014  . Knee arthroplasty Left 11/27/2014    Procedure: LEFT TOTAL KNEE ARTHROPLASTY;  Surgeon: Marybelle Killings, MD;  Location: [place];  Service: Orthopedics;  Laterality: Left;    Current Outpatient Prescriptions  Medication Sig Dispense Refill  . amitriptyline (ELAVIL) 50 MG tablet Take one table by mouth every night at bedtime.  90 tablet 3  . apixaban (ELIQUIS) 5 MG TABS tablet Take 1 tablet (5 mg total) by mouth 2 (two) times daily. 60 tablet   . atorvastatin (LIPITOR) 40 MG tablet Take 40 mg by mouth daily.     . cholecalciferol (VITAMIN D) 1000 UNITS tablet Take 1,000 Units by mouth daily.    . clopidogrel (PLAVIX) 75 MG tablet Take 1 tablet (75 mg total) by mouth daily. 90 tablet 3  . cycloSPORINE (RESTASIS) 0.05 % ophthalmic emulsion Place 1 drop into both eyes 2 (two) times daily.     Marland Kitchen DEXILANT 60 MG capsule TAKE 1 CAPSULE BY MOUTH ONCE DAILY 90 capsule 3  . diltiazem (CARDIZEM CD) 180 MG 24 hr capsule TAKE 1 CAPSULE BY MOUTH ONCE DAILY 90 capsule 3  . FIBER SELECT GUMMIES PO Take 2 capsules by mouth daily.    . Fluocinolone Acetonide 0.01 % OIL Place 2 drops into both ears daily as needed. Does every two weeks    . fluticasone-salmeterol (ADVAIR HFA) 115-21 MCG/ACT inhaler Inhale 2 puffs into the lungs 2 (two) times daily. 1 Inhaler 12  . gabapentin (NEURONTIN) 300 MG capsule Take 2 capsules of 300 mg each at night by mouth between one and 2 hours prior to bedtime. You can take another 300 mg in daytime for pain. 270 capsule 3  . ipratropium (ATROVENT) 0.06 % nasal spray Place 2 sprays into both nostrils 4 (four) times daily. (Patient taking differently: Place 2 sprays  into both nostrils 3 (three) times daily as needed. ) 15 mL 12  . isosorbide mononitrate (IMDUR) 60 MG 24 hr tablet Take 1 tablet (60 mg total) by mouth daily. 90 tablet 3  . losartan (COZAAR) 100 MG tablet Take 1 tablet (100 mg total) by mouth daily. 90 tablet 3  . methocarbamol (ROBAXIN) 500 MG tablet Take 1 tablet (500 mg total) by mouth every 6 (six) hours as needed for muscle spasms. 60 tablet 0  . metoCLOPramide (REGLAN) 10 MG tablet Take one tab before each meal and at bedtime (Patient taking differently: Take one tablet by mouth before each meal and at bedtime) 120 tablet 2  . nitroGLYCERIN (NITROLINGUAL) 0.4 MG/SPRAY spray Place 1 spray under the tongue every 5 (five) minutes as needed. Chest pains 12 g 0  . olopatadine (PATANOL) 0.1 % ophthalmic solution Insert 1 drop in both eyes twice daily  6  . oxyCODONE-acetaminophen (ROXICET) 5-325 MG tablet Take 1 tablet by mouth every 4 (four) hours as needed for severe pain. 60 tablet 0  . polyethylene glycol (MIRALAX / GLYCOLAX) packet Take 17 g by mouth daily as needed for mild constipation. 14 each 0  . PROAIR HFA 108 (90 BASE) MCG/ACT inhaler INHALE 2 PUFFS BY MOUTH EVERY 6 HOURS FOR WHEEZING AND SHORTNESS OF BREATH 1 Inhaler 0  . Saxagliptin-Metformin 05-998 MG TB24 Take by mouth. Take one tablet every evening with a meal.    . topiramate (TOPAMAX) 100 MG tablet TAKE 1 TABLET BY MOUTH EVERY EVENING 30 tablet 2  . topiramate (TOPAMAX) 25 MG tablet TAKE 1 TABLET BY MOUTH EVERY MORNING. 90 tablet 3  . hyoscyamine (LEVSIN SL) 0.125 MG SL tablet PLACE 2 TABLETS UNDER THE TONGUE EVERY 4 HOURS AS NEEDED. (Patient not taking: Reported on 12/19/2014) 30 tablet 1   No current facility-administered medications for this encounter.    Allergies  Allergen Reactions  . Contrast Media [Iodinated Diagnostic Agents] Anaphylaxis  . Gadolinium Anaphylaxis       .  Aspirin Nausea And Vomiting    Fast heart rate  . Iohexol Other (See Comments)    unknown   . Trazodone And Nefazodone     Dizziness,lightheadness, heart felt funny    Social History   Social History  . Marital Status: Single    Spouse Name: N/A  . Number of Children: 2  . Years of Education: college   Occupational History  . Retired    Social History Main Topics  . Smoking status: Former Smoker    Types: Cigarettes    Quit date: 01/19/1983  . Smokeless tobacco: Never Used  . Alcohol Use: No  . Drug Use: No  . Sexual Activity: Not on file   Other Topics Concern  . Not on file   Social History Narrative   Patient is single and lives alone.   Patient has two children.   Patient is retired.   Patient has a college education.   Patient is right-handed.   Patient does not drink any caffeine.    Family History  Problem Relation Age of Onset  . Pancreatic cancer Mother   . Heart attack Father   . Diabetes Son   . Arthritis Brother     Severe/Crippling  . Tuberculosis Brother   . Hypertension Brother     x 2  . Heart disease Sister   . Diabetes Brother   . Seizures Brother   . Autoimmune disease Brother     AIDS  . Colon cancer Brother   . Lung cancer Brother     mets    ROS- All systems are reviewed and negative except as per the HPI above  Physical Exam: Filed Vitals:   12/19/14 1420  BP: 112/70  Pulse: 98  Height: 5\' 4"  (1.626 m)  Weight: 216 lb (97.977 kg)    GEN- The patient is well appearing, alert and oriented x 3 today.   Head- normocephalic, atraumatic Eyes-  Sclera clear, conjunctiva pink Ears- hearing intact Oropharynx- clear Neck- supple, no JVP Lymph- no cervical lymphadenopathy Lungs- Clear to ausculation bilaterally, normal work of breathing Heart- Regular rate and rhythm, no murmurs, rubs or gallops, PMI not laterally displaced GI- soft, NT, ND, + BS Extremities- no clubbing, cyanosis, or edema MS- no significant deformity or atrophy Skin- no rash or lesion Psych- euthymic mood, full affect Neuro- strength and  sensation are intact  EKG- NSR lad, Inferior infarct, age undetermined, v rate 98 bpm, pr int 156 ms, qrs int 62 ms, qtc 398 ms Epic records reviewed  Assessment and Plan: 1. PAF Appears to be of low burden at this time Continue apixaban CBC today  2. CAD Continue plavix Watch and report any bleeding issues  3. HTN Stable  4.Syncope No recent syncopal episodes Continue monitoring with LINQ   F/u with Dr. Lovena Le 12/16 afib clinic as needed

## 2014-12-21 DIAGNOSIS — E785 Hyperlipidemia, unspecified: Secondary | ICD-10-CM | POA: Diagnosis not present

## 2014-12-21 DIAGNOSIS — Z471 Aftercare following joint replacement surgery: Secondary | ICD-10-CM | POA: Diagnosis not present

## 2014-12-21 DIAGNOSIS — E669 Obesity, unspecified: Secondary | ICD-10-CM | POA: Diagnosis not present

## 2014-12-21 DIAGNOSIS — E1140 Type 2 diabetes mellitus with diabetic neuropathy, unspecified: Secondary | ICD-10-CM | POA: Diagnosis not present

## 2014-12-21 DIAGNOSIS — G4701 Insomnia due to medical condition: Secondary | ICD-10-CM | POA: Diagnosis not present

## 2014-12-21 DIAGNOSIS — E1122 Type 2 diabetes mellitus with diabetic chronic kidney disease: Secondary | ICD-10-CM | POA: Diagnosis not present

## 2014-12-21 DIAGNOSIS — I129 Hypertensive chronic kidney disease with stage 1 through stage 4 chronic kidney disease, or unspecified chronic kidney disease: Secondary | ICD-10-CM | POA: Diagnosis not present

## 2014-12-21 DIAGNOSIS — G43111 Migraine with aura, intractable, with status migrainosus: Secondary | ICD-10-CM | POA: Diagnosis not present

## 2014-12-21 DIAGNOSIS — J45909 Unspecified asthma, uncomplicated: Secondary | ICD-10-CM | POA: Diagnosis not present

## 2014-12-21 DIAGNOSIS — N189 Chronic kidney disease, unspecified: Secondary | ICD-10-CM | POA: Diagnosis not present

## 2014-12-21 DIAGNOSIS — Z96652 Presence of left artificial knee joint: Secondary | ICD-10-CM | POA: Diagnosis not present

## 2014-12-21 DIAGNOSIS — H409 Unspecified glaucoma: Secondary | ICD-10-CM | POA: Diagnosis not present

## 2014-12-21 DIAGNOSIS — G4733 Obstructive sleep apnea (adult) (pediatric): Secondary | ICD-10-CM | POA: Diagnosis not present

## 2014-12-23 ENCOUNTER — Other Ambulatory Visit: Payer: Self-pay | Admitting: Internal Medicine

## 2014-12-23 DIAGNOSIS — I129 Hypertensive chronic kidney disease with stage 1 through stage 4 chronic kidney disease, or unspecified chronic kidney disease: Secondary | ICD-10-CM | POA: Diagnosis not present

## 2014-12-23 DIAGNOSIS — G4733 Obstructive sleep apnea (adult) (pediatric): Secondary | ICD-10-CM | POA: Diagnosis not present

## 2014-12-23 DIAGNOSIS — E785 Hyperlipidemia, unspecified: Secondary | ICD-10-CM | POA: Diagnosis not present

## 2014-12-23 DIAGNOSIS — E669 Obesity, unspecified: Secondary | ICD-10-CM | POA: Diagnosis not present

## 2014-12-23 DIAGNOSIS — G4701 Insomnia due to medical condition: Secondary | ICD-10-CM | POA: Diagnosis not present

## 2014-12-23 DIAGNOSIS — N189 Chronic kidney disease, unspecified: Secondary | ICD-10-CM | POA: Diagnosis not present

## 2014-12-23 DIAGNOSIS — J45909 Unspecified asthma, uncomplicated: Secondary | ICD-10-CM | POA: Diagnosis not present

## 2014-12-23 DIAGNOSIS — E1122 Type 2 diabetes mellitus with diabetic chronic kidney disease: Secondary | ICD-10-CM | POA: Diagnosis not present

## 2014-12-23 DIAGNOSIS — H409 Unspecified glaucoma: Secondary | ICD-10-CM | POA: Diagnosis not present

## 2014-12-23 DIAGNOSIS — Z96652 Presence of left artificial knee joint: Secondary | ICD-10-CM | POA: Diagnosis not present

## 2014-12-23 DIAGNOSIS — Z471 Aftercare following joint replacement surgery: Secondary | ICD-10-CM | POA: Diagnosis not present

## 2014-12-23 DIAGNOSIS — E1140 Type 2 diabetes mellitus with diabetic neuropathy, unspecified: Secondary | ICD-10-CM | POA: Diagnosis not present

## 2014-12-23 DIAGNOSIS — G43111 Migraine with aura, intractable, with status migrainosus: Secondary | ICD-10-CM | POA: Diagnosis not present

## 2014-12-23 MED ORDER — NITROGLYCERIN 0.4 MG/SPRAY TL SOLN: 1.0000 | Status: DC | PRN

## 2014-12-24 ENCOUNTER — Encounter: Payer: Self-pay | Admitting: Internal Medicine

## 2014-12-24 ENCOUNTER — Ambulatory Visit (INDEPENDENT_AMBULATORY_CARE_PROVIDER_SITE_OTHER): Payer: Medicare Other | Admitting: Internal Medicine

## 2014-12-24 VITALS — BP 128/70 | HR 114 | Temp 98.7°F | Resp 18 | Ht 64.5 in | Wt 219.0 lb

## 2014-12-24 DIAGNOSIS — E1142 Type 2 diabetes mellitus with diabetic polyneuropathy: Secondary | ICD-10-CM | POA: Diagnosis not present

## 2014-12-24 DIAGNOSIS — K5900 Constipation, unspecified: Secondary | ICD-10-CM | POA: Diagnosis not present

## 2014-12-24 DIAGNOSIS — N898 Other specified noninflammatory disorders of vagina: Secondary | ICD-10-CM | POA: Diagnosis not present

## 2014-12-24 DIAGNOSIS — M170 Bilateral primary osteoarthritis of knee: Secondary | ICD-10-CM

## 2014-12-24 DIAGNOSIS — Z23 Encounter for immunization: Secondary | ICD-10-CM

## 2014-12-24 NOTE — Assessment & Plan Note (Signed)
Due to oxycodone for pain relief. Advised miralax BID until having regular bowel movements then daily until off pain medicine. She will do this.

## 2014-12-24 NOTE — Assessment & Plan Note (Signed)
Recent HgA1c at goal, no adjustments to her metformin and saxagliptin needed today. On losartan and statin as well. Reminded about yearly eye exam.

## 2014-12-24 NOTE — Patient Instructions (Signed)
We have given you the tetanus shot today.   You should call [employer] and find out if they will pay for the shingles shot as medicare does not cover it. This number can be found on your insurance card.

## 2014-12-24 NOTE — Assessment & Plan Note (Signed)
She has had knee replacement and is doing therapy. Ambulating with walker and wound not infected appearing on exam. Still needing oxycodone for pain at times.

## 2014-12-24 NOTE — Progress Notes (Signed)
   Subjective:    Patient ID: [name], female    DOB: 1945-11-30, 69 y.o.   MRN: [id number]  HPI The patient is a 69 YO female coming in for follow up of hospital and rehab (in for knee replacement). She is doing well with recovery. Is constipated from pain medicine. Was given fiber gummies which are not helping. She has miralax at home and will try that tonight. No other concerns. No chest pains, SOB, nausea. Is walking with a walker and still doing PT at home. Progressing well.   PMH, [facility], social history reviewed and updated.   Review of Systems  Constitutional: Positive for activity change. Negative for fever, appetite change and fatigue.  Respiratory: Negative for cough, chest tightness, shortness of breath and wheezing.   Cardiovascular: Negative for chest pain, palpitations and leg swelling.  Gastrointestinal: Positive for constipation. Negative for nausea, diarrhea, blood in stool and abdominal distention.  Musculoskeletal: Positive for back pain and arthralgias.  Skin: Negative.   Neurological: Positive for numbness. Negative for dizziness, weakness, light-headedness and headaches.  Psychiatric/Behavioral: Positive for sleep disturbance. Negative for decreased concentration and agitation. The patient is not nervous/anxious.       Objective:   Physical Exam  Constitutional: She is oriented to person, place, and time. She appears well-developed and well-nourished.  HENT:  Head: Normocephalic and atraumatic.  Eyes: EOM are normal.  Neck: Normal range of motion.  Cardiovascular: Normal rate and regular rhythm.   Pulmonary/Chest: Effort normal. No respiratory distress. She has no wheezes. She has no rales.  Abdominal: Soft. Bowel sounds are normal. She exhibits no distension. There is no tenderness.  Musculoskeletal: She exhibits no edema.  Neurological: She is alert and oriented to person, place, and time.  Slow to stand, steady with walker  Skin: Skin is warm and dry.    Psychiatric: She has a normal mood and affect.   Filed Vitals:   12/24/14 1342  BP: 128/70  Pulse: 114  Temp: 98.7 F (37.1 C)  TempSrc: Oral  Resp: 18  Height: 5' 4.5" (1.638 m)  Weight: 219 lb (99.338 kg)  SpO2: 94%      Assessment & Plan:

## 2014-12-24 NOTE — Progress Notes (Signed)
Pre visit review using our clinic review tool, if applicable. No additional management support is needed unless otherwise documented below in the visit note. 

## 2014-12-25 ENCOUNTER — Other Ambulatory Visit: Payer: Self-pay | Admitting: Internal Medicine

## 2014-12-25 DIAGNOSIS — Z23 Encounter for immunization: Secondary | ICD-10-CM | POA: Diagnosis not present

## 2014-12-25 DIAGNOSIS — G43111 Migraine with aura, intractable, with status migrainosus: Secondary | ICD-10-CM | POA: Diagnosis not present

## 2014-12-25 DIAGNOSIS — Z471 Aftercare following joint replacement surgery: Secondary | ICD-10-CM | POA: Diagnosis not present

## 2014-12-25 DIAGNOSIS — G4701 Insomnia due to medical condition: Secondary | ICD-10-CM | POA: Diagnosis not present

## 2014-12-25 DIAGNOSIS — Z96652 Presence of left artificial knee joint: Secondary | ICD-10-CM | POA: Diagnosis not present

## 2014-12-25 DIAGNOSIS — E669 Obesity, unspecified: Secondary | ICD-10-CM | POA: Diagnosis not present

## 2014-12-25 DIAGNOSIS — G4733 Obstructive sleep apnea (adult) (pediatric): Secondary | ICD-10-CM | POA: Diagnosis not present

## 2014-12-25 DIAGNOSIS — E785 Hyperlipidemia, unspecified: Secondary | ICD-10-CM | POA: Diagnosis not present

## 2014-12-25 DIAGNOSIS — N189 Chronic kidney disease, unspecified: Secondary | ICD-10-CM | POA: Diagnosis not present

## 2014-12-25 DIAGNOSIS — J45909 Unspecified asthma, uncomplicated: Secondary | ICD-10-CM | POA: Diagnosis not present

## 2014-12-25 DIAGNOSIS — E1140 Type 2 diabetes mellitus with diabetic neuropathy, unspecified: Secondary | ICD-10-CM | POA: Diagnosis not present

## 2014-12-25 DIAGNOSIS — E1122 Type 2 diabetes mellitus with diabetic chronic kidney disease: Secondary | ICD-10-CM | POA: Diagnosis not present

## 2014-12-25 DIAGNOSIS — H409 Unspecified glaucoma: Secondary | ICD-10-CM | POA: Diagnosis not present

## 2014-12-25 DIAGNOSIS — I129 Hypertensive chronic kidney disease with stage 1 through stage 4 chronic kidney disease, or unspecified chronic kidney disease: Secondary | ICD-10-CM | POA: Diagnosis not present

## 2014-12-25 NOTE — Addendum Note (Signed)
Addended by: Resa Miner R on: 12/25/2014 12:25 PM   Modules accepted: Orders

## 2014-12-27 DIAGNOSIS — E1140 Type 2 diabetes mellitus with diabetic neuropathy, unspecified: Secondary | ICD-10-CM | POA: Diagnosis not present

## 2014-12-27 DIAGNOSIS — Z471 Aftercare following joint replacement surgery: Secondary | ICD-10-CM | POA: Diagnosis not present

## 2014-12-27 DIAGNOSIS — G4733 Obstructive sleep apnea (adult) (pediatric): Secondary | ICD-10-CM | POA: Diagnosis not present

## 2014-12-27 DIAGNOSIS — I129 Hypertensive chronic kidney disease with stage 1 through stage 4 chronic kidney disease, or unspecified chronic kidney disease: Secondary | ICD-10-CM | POA: Diagnosis not present

## 2014-12-27 DIAGNOSIS — E669 Obesity, unspecified: Secondary | ICD-10-CM | POA: Diagnosis not present

## 2014-12-27 DIAGNOSIS — E1122 Type 2 diabetes mellitus with diabetic chronic kidney disease: Secondary | ICD-10-CM | POA: Diagnosis not present

## 2014-12-27 DIAGNOSIS — H409 Unspecified glaucoma: Secondary | ICD-10-CM | POA: Diagnosis not present

## 2014-12-27 DIAGNOSIS — G4701 Insomnia due to medical condition: Secondary | ICD-10-CM | POA: Diagnosis not present

## 2014-12-27 DIAGNOSIS — E785 Hyperlipidemia, unspecified: Secondary | ICD-10-CM | POA: Diagnosis not present

## 2014-12-27 DIAGNOSIS — G43111 Migraine with aura, intractable, with status migrainosus: Secondary | ICD-10-CM | POA: Diagnosis not present

## 2014-12-27 DIAGNOSIS — N189 Chronic kidney disease, unspecified: Secondary | ICD-10-CM | POA: Diagnosis not present

## 2014-12-27 DIAGNOSIS — J45909 Unspecified asthma, uncomplicated: Secondary | ICD-10-CM | POA: Diagnosis not present

## 2014-12-27 DIAGNOSIS — Z96652 Presence of left artificial knee joint: Secondary | ICD-10-CM | POA: Diagnosis not present

## 2014-12-30 DIAGNOSIS — I129 Hypertensive chronic kidney disease with stage 1 through stage 4 chronic kidney disease, or unspecified chronic kidney disease: Secondary | ICD-10-CM | POA: Diagnosis not present

## 2014-12-30 DIAGNOSIS — H409 Unspecified glaucoma: Secondary | ICD-10-CM | POA: Diagnosis not present

## 2014-12-30 DIAGNOSIS — Z96652 Presence of left artificial knee joint: Secondary | ICD-10-CM | POA: Diagnosis not present

## 2014-12-30 DIAGNOSIS — Z471 Aftercare following joint replacement surgery: Secondary | ICD-10-CM | POA: Diagnosis not present

## 2014-12-30 DIAGNOSIS — E669 Obesity, unspecified: Secondary | ICD-10-CM | POA: Diagnosis not present

## 2014-12-30 DIAGNOSIS — G43111 Migraine with aura, intractable, with status migrainosus: Secondary | ICD-10-CM | POA: Diagnosis not present

## 2014-12-30 DIAGNOSIS — N189 Chronic kidney disease, unspecified: Secondary | ICD-10-CM | POA: Diagnosis not present

## 2014-12-30 DIAGNOSIS — G4733 Obstructive sleep apnea (adult) (pediatric): Secondary | ICD-10-CM | POA: Diagnosis not present

## 2014-12-30 DIAGNOSIS — E1122 Type 2 diabetes mellitus with diabetic chronic kidney disease: Secondary | ICD-10-CM | POA: Diagnosis not present

## 2014-12-30 DIAGNOSIS — G4701 Insomnia due to medical condition: Secondary | ICD-10-CM | POA: Diagnosis not present

## 2014-12-30 DIAGNOSIS — E1140 Type 2 diabetes mellitus with diabetic neuropathy, unspecified: Secondary | ICD-10-CM | POA: Diagnosis not present

## 2014-12-30 DIAGNOSIS — E785 Hyperlipidemia, unspecified: Secondary | ICD-10-CM | POA: Diagnosis not present

## 2014-12-30 DIAGNOSIS — J45909 Unspecified asthma, uncomplicated: Secondary | ICD-10-CM | POA: Diagnosis not present

## 2014-12-31 DIAGNOSIS — N189 Chronic kidney disease, unspecified: Secondary | ICD-10-CM | POA: Diagnosis not present

## 2014-12-31 DIAGNOSIS — E1140 Type 2 diabetes mellitus with diabetic neuropathy, unspecified: Secondary | ICD-10-CM | POA: Diagnosis not present

## 2014-12-31 DIAGNOSIS — I129 Hypertensive chronic kidney disease with stage 1 through stage 4 chronic kidney disease, or unspecified chronic kidney disease: Secondary | ICD-10-CM | POA: Diagnosis not present

## 2014-12-31 DIAGNOSIS — Z96652 Presence of left artificial knee joint: Secondary | ICD-10-CM | POA: Diagnosis not present

## 2014-12-31 DIAGNOSIS — E785 Hyperlipidemia, unspecified: Secondary | ICD-10-CM | POA: Diagnosis not present

## 2014-12-31 DIAGNOSIS — G43111 Migraine with aura, intractable, with status migrainosus: Secondary | ICD-10-CM | POA: Diagnosis not present

## 2014-12-31 DIAGNOSIS — Z471 Aftercare following joint replacement surgery: Secondary | ICD-10-CM | POA: Diagnosis not present

## 2014-12-31 DIAGNOSIS — E1122 Type 2 diabetes mellitus with diabetic chronic kidney disease: Secondary | ICD-10-CM | POA: Diagnosis not present

## 2014-12-31 DIAGNOSIS — J45909 Unspecified asthma, uncomplicated: Secondary | ICD-10-CM | POA: Diagnosis not present

## 2014-12-31 DIAGNOSIS — E669 Obesity, unspecified: Secondary | ICD-10-CM | POA: Diagnosis not present

## 2014-12-31 DIAGNOSIS — H409 Unspecified glaucoma: Secondary | ICD-10-CM | POA: Diagnosis not present

## 2014-12-31 DIAGNOSIS — G4733 Obstructive sleep apnea (adult) (pediatric): Secondary | ICD-10-CM | POA: Diagnosis not present

## 2014-12-31 DIAGNOSIS — G4701 Insomnia due to medical condition: Secondary | ICD-10-CM | POA: Diagnosis not present

## 2015-01-01 DIAGNOSIS — E785 Hyperlipidemia, unspecified: Secondary | ICD-10-CM | POA: Diagnosis not present

## 2015-01-01 DIAGNOSIS — H409 Unspecified glaucoma: Secondary | ICD-10-CM | POA: Diagnosis not present

## 2015-01-01 DIAGNOSIS — E1140 Type 2 diabetes mellitus with diabetic neuropathy, unspecified: Secondary | ICD-10-CM | POA: Diagnosis not present

## 2015-01-01 DIAGNOSIS — E1122 Type 2 diabetes mellitus with diabetic chronic kidney disease: Secondary | ICD-10-CM | POA: Diagnosis not present

## 2015-01-01 DIAGNOSIS — E669 Obesity, unspecified: Secondary | ICD-10-CM | POA: Diagnosis not present

## 2015-01-01 DIAGNOSIS — I129 Hypertensive chronic kidney disease with stage 1 through stage 4 chronic kidney disease, or unspecified chronic kidney disease: Secondary | ICD-10-CM | POA: Diagnosis not present

## 2015-01-01 DIAGNOSIS — G43111 Migraine with aura, intractable, with status migrainosus: Secondary | ICD-10-CM | POA: Diagnosis not present

## 2015-01-01 DIAGNOSIS — G4733 Obstructive sleep apnea (adult) (pediatric): Secondary | ICD-10-CM | POA: Diagnosis not present

## 2015-01-01 DIAGNOSIS — N189 Chronic kidney disease, unspecified: Secondary | ICD-10-CM | POA: Diagnosis not present

## 2015-01-01 DIAGNOSIS — J45909 Unspecified asthma, uncomplicated: Secondary | ICD-10-CM | POA: Diagnosis not present

## 2015-01-01 DIAGNOSIS — Z471 Aftercare following joint replacement surgery: Secondary | ICD-10-CM | POA: Diagnosis not present

## 2015-01-01 DIAGNOSIS — Z96652 Presence of left artificial knee joint: Secondary | ICD-10-CM | POA: Diagnosis not present

## 2015-01-01 DIAGNOSIS — G4701 Insomnia due to medical condition: Secondary | ICD-10-CM | POA: Diagnosis not present

## 2015-01-02 DIAGNOSIS — G43111 Migraine with aura, intractable, with status migrainosus: Secondary | ICD-10-CM | POA: Diagnosis not present

## 2015-01-02 DIAGNOSIS — Z471 Aftercare following joint replacement surgery: Secondary | ICD-10-CM | POA: Diagnosis not present

## 2015-01-02 DIAGNOSIS — E1140 Type 2 diabetes mellitus with diabetic neuropathy, unspecified: Secondary | ICD-10-CM | POA: Diagnosis not present

## 2015-01-02 DIAGNOSIS — N189 Chronic kidney disease, unspecified: Secondary | ICD-10-CM | POA: Diagnosis not present

## 2015-01-02 DIAGNOSIS — G4733 Obstructive sleep apnea (adult) (pediatric): Secondary | ICD-10-CM | POA: Diagnosis not present

## 2015-01-02 DIAGNOSIS — I129 Hypertensive chronic kidney disease with stage 1 through stage 4 chronic kidney disease, or unspecified chronic kidney disease: Secondary | ICD-10-CM | POA: Diagnosis not present

## 2015-01-02 DIAGNOSIS — E1122 Type 2 diabetes mellitus with diabetic chronic kidney disease: Secondary | ICD-10-CM | POA: Diagnosis not present

## 2015-01-02 DIAGNOSIS — Z96652 Presence of left artificial knee joint: Secondary | ICD-10-CM | POA: Diagnosis not present

## 2015-01-02 DIAGNOSIS — E785 Hyperlipidemia, unspecified: Secondary | ICD-10-CM | POA: Diagnosis not present

## 2015-01-02 DIAGNOSIS — G4701 Insomnia due to medical condition: Secondary | ICD-10-CM | POA: Diagnosis not present

## 2015-01-02 DIAGNOSIS — J45998 Other asthma: Secondary | ICD-10-CM | POA: Diagnosis not present

## 2015-01-02 DIAGNOSIS — J45909 Unspecified asthma, uncomplicated: Secondary | ICD-10-CM | POA: Diagnosis not present

## 2015-01-02 DIAGNOSIS — H409 Unspecified glaucoma: Secondary | ICD-10-CM | POA: Diagnosis not present

## 2015-01-02 DIAGNOSIS — E669 Obesity, unspecified: Secondary | ICD-10-CM | POA: Diagnosis not present

## 2015-01-03 ENCOUNTER — Encounter: Payer: Self-pay | Admitting: Internal Medicine

## 2015-01-03 ENCOUNTER — Ambulatory Visit (INDEPENDENT_AMBULATORY_CARE_PROVIDER_SITE_OTHER): Payer: Medicare Other | Admitting: Internal Medicine

## 2015-01-03 VITALS — BP 128/58 | HR 93 | Ht 64.5 in | Wt 218.6 lb

## 2015-01-03 DIAGNOSIS — I480 Paroxysmal atrial fibrillation: Secondary | ICD-10-CM

## 2015-01-03 DIAGNOSIS — E785 Hyperlipidemia, unspecified: Secondary | ICD-10-CM

## 2015-01-03 DIAGNOSIS — I10 Essential (primary) hypertension: Secondary | ICD-10-CM

## 2015-01-03 DIAGNOSIS — R55 Syncope and collapse: Secondary | ICD-10-CM

## 2015-01-03 LAB — CUP PACEART INCLINIC DEVICE CHECK: MDC IDC SESS DTM: 20161216125508

## 2015-01-03 NOTE — Assessment & Plan Note (Signed)
Her blood pressure is not elevated. Will continue her current meds.

## 2015-01-03 NOTE — Assessment & Plan Note (Signed)
We discussed the importance of a low fat diet. Will follow. She is encouraged to lose weight.

## 2015-01-03 NOTE — Assessment & Plan Note (Signed)
She has had no recurrent episodes. She has bradycardia on her monitor but this is undersensing.

## 2015-01-03 NOTE — Patient Instructions (Signed)

## 2015-01-03 NOTE — Progress Notes (Signed)
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  She was unable to walk and underwent stress perfusion imaging which was unremarkable. She also wore a cardiac monitor which demonstrated PACs and PVCs but no sustained arrhythmias. She has a h/o non-cardiac chest pain. 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 She had another episode of chest pain and was seen in the ER. Evaluation was unrevealing. She has been out of her Imdur. Several months ago she underwent insertion of an ILR due to unexplained syncope. She admits to being very sedentary because of back pain. She has undergone knee replacement surgery. No additional syncope. Allergies  Allergen Reactions  . Contrast Media [Iodinated Diagnostic Agents] Anaphylaxis  . Gadolinium Anaphylaxis       . Aspirin Nausea And Vomiting    Fast heart rate  . Iohexol Other (See Comments)    unknown  . Trazodone And Nefazodone     Dizziness,lightheadness, heart felt funny     Current Outpatient Prescriptions  Medication Sig Dispense Refill  . amitriptyline (ELAVIL) 50 MG tablet Take one table by mouth every night at bedtime. 90 tablet 3  . atorvastatin (LIPITOR) 40 MG tablet Take 40 mg by mouth daily.     . cholecalciferol (VITAMIN D) 1000 UNITS tablet Take 1,000 Units by mouth daily.    . clopidogrel (PLAVIX) 75 MG tablet Take 75 mg by mouth daily.    . cycloSPORINE (RESTASIS) 0.05 % ophthalmic emulsion Place 1 drop into both eyes 2 (two) times daily.     Marland Kitchen DEXILANT 60 MG capsule TAKE 1 CAPSULE BY MOUTH ONCE DAILY 90 capsule 3  . diazepam (VALIUM) 5 MG tablet TAKE 1 TABLET BY MOUTH TWICE DAILY FOR MUSCLE SPASMS 60 tablet 1  . diltiazem (CARDIZEM CD) 180 MG 24 hr capsule TAKE 1 CAPSULE BY MOUTH ONCE DAILY 90 capsule 3  . FIBER SELECT GUMMIES PO Take 2 capsules by mouth daily.    . Fluocinolone Acetonide 0.01 % OIL Place 2 drops  into both ears daily as needed. Does every two weeks    . fluticasone-salmeterol (ADVAIR HFA) 115-21 MCG/ACT inhaler Inhale 2 puffs into the lungs 2 (two) times daily. 1 Inhaler 12  . gabapentin (NEURONTIN) 300 MG capsule Take 2 capsules of 300 mg each at night by mouth between one and 2 hours prior to bedtime. You can take another 300 mg in daytime for pain. 270 capsule 3  . ipratropium (ATROVENT) 0.06 % nasal spray Place 2 sprays into both nostrils 3 (three) times daily.    . isosorbide mononitrate (IMDUR) 60 MG 24 hr tablet TAKE 1 TABLET BY MOUTH ONCE DAILY 90 tablet 1  . losartan (COZAAR) 100 MG tablet TAKE 1 TABLET BY MOUTH EVERY MORNING 90 tablet 1  . methocarbamol (ROBAXIN) 500 MG tablet Take 1 tablet (500 mg total) by mouth every 6 (six) hours as needed for muscle spasms. 60 tablet 0  . metoCLOPramide (REGLAN) 10 MG tablet Take 10 mg by mouth 4 (four) times daily -  before meals and at bedtime.    . nitroGLYCERIN (NITROLINGUAL) 0.4 MG/SPRAY spray Place 1 spray under the tongue every 5 (five) minutes as needed. Chest pains 12 g 0  . olopatadine (PATANOL) 0.1 % ophthalmic solution Insert 1 drop in both eyes twice daily  6  . oxyCODONE-acetaminophen (ROXICET) 5-325 MG tablet Take 1 tablet by mouth  every 4 (four) hours as needed for severe pain. 60 tablet 0  . polyethylene glycol (MIRALAX / GLYCOLAX) packet Take 17 g by mouth daily as needed for mild constipation. 14 each 0  . PROAIR HFA 108 (90 BASE) MCG/ACT inhaler INHALE 2 PUFFS BY MOUTH EVERY 6 HOURS FOR WHEEZING AND SHORTNESS OF BREATH 1 Inhaler 0  . Saxagliptin-Metformin 05-998 MG TB24 Take by mouth. Take one tablet every evening with a meal.    . topiramate (TOPAMAX) 100 MG tablet TAKE 1 TABLET BY MOUTH EVERY EVENING 30 tablet 2  . topiramate (TOPAMAX) 25 MG tablet TAKE 1 TABLET BY MOUTH EVERY MORNING. 90 tablet 3   No current facility-administered medications for this visit.     Past Medical History  Diagnosis Date  . Myocardial  infarction [facility]) [place] Multi W/Spect W/Wall Motion EF on 08/07/10 - IMPRESSION: 1) No evidence of ischemia.  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  3) Normal LV wall motion.  4) Estimated Q G S EF 70%, unchanged.  . Asthma   . Blood transfusion   . GERD (gastroesophageal reflux disease)   . Hiatal hernia   . Other forms of migraine, without mention of intractable migraine without mention of status migrainosus   . Dry eye     Chronic  . Hypercholesteremia   . Spondylolisthesis   . Diverticulosis of colon (without mention of hemorrhage)     Colonoscopy 11/09/09 - FINDINGS: 1) 57mm polp in transverse colon, resected & retrieved.  2) Diverticulosis in sigmoid & descending colon.  3) Medium-sized, internal hemorrhoids.  . Internal hemorrhoids without mention of complication     Colonoscopy 11/09/09 - FINDINGS: 1) 52mm polp in transverse colon, resected & retrieved.  2) Diverticulosis in sigmoid & descending colon.  3) Medium-sized, internal hemorrhoids.  . Benign neoplasm of colon 11/09/09    Colonoscopy 11/09/09 - FINDINGS: 1) 80mm polp in transverse colon, resected & retrieved.  2) Diverticulosis in sigmoid & descending colon.  3) Medium-sized, internal hemorrhoids.  . Hemorrhage of rectum and anus     Colonoscopy 11/09/09 - FINDINGS: 1) 72mm polp in transverse colon, resected & retrieved.  2) Diverticulosis in sigmoid & descending colon.  3) Medium-sized, internal hemorrhoids.  . Essential hypertension, benign   . Esophagitis   . CMC arthritis     Left middle finger  . Depression     40+ years  . Diabetes mellitus 10/2008    type 2  . Breast cancer ([place]) 1989    Left breast cancer  . Obese   . Syncope     MDT LinQ implanted for evaluation  . Facet arthropathy, lumbar     L3-4  . Obstructive sleep apnea     uses oxygen at night  . Allergy   . Anemia   . Arthritis   . Blood transfusion without reported diagnosis   .  Cataract   . Glaucoma   . Oxygen deficiency     .2  . Dysrhythmia   . Anginal pain ([place])   . Shortness of breath dyspnea   . Chronic kidney disease     states she has a cyst on her kidney    ROS:   All systems reviewed and negative except as noted in the HPI.   Past Surgical History  Procedure Laterality Date  . Abdominal hysterectomy  1978  . Joint replacement      lsft hip  x2, rt knee  . Knee arthroscopy Right 2002    By Dr. Lorin Mercy  . Foot surgery Bilateral     Bil feet great toes and second toes spurs. By Dr. Milinda Pointer in 2004.  . Breast biopsy Left 1995    Biopsy at [facility] ([facility])  . Tonsillectomy    . Back surgery      x2 one for infection  . Cholecystectomy    . Total hip revision  12/14/2010    Procedure: TOTAL HIP REVISION;  Surgeon: Marybelle Killings;  Location: [place];  Service: Orthopedics;  Laterality: Left;  Left Total Hip Arthroplasty Revision, Poly and Ball Exchange, Possible Acetabular Revision vs. Poly Exchange  . Colonoscopy w/ polypectomy  11/09/09    FINDINGS: 1) 76mm polp in transverse colon, resected & retrieved.  2) Diverticulosis in sigmoid & descending colon.  3) Medium-sized, internal hemorrhoids.  . Loop recorder implant  11/15/06; 04/02/2013    MDT Reveal implanted 10/2006 for syncope; removed with MDT LinQ implanted 03/2013 by Dr Lovena Le  . Hemorrhoid surgery  2011    Internal & external hemorrhoidectomy (x) 3 quadrants  . Esophagogastroduodenoscopy  02/15/07  . Dilation and curettage of uterus    . Trigger finger release Left 01/02/2013    Procedure: RELEASE LEFT MIDDLE TRIGGER FINGER/LEFT INDEX AND LEFT RING FINGER;  Surgeon: Wynonia Sours, MD;  Location: [place];  Service: Orthopedics;  Laterality: Left;  ANESTHESIA: GENERAL/IV REGIONAL FAB  . Loop recorder implant N/A 04/02/2013    Procedure: LOOP RECORDER IMPLANT;  Surgeon: Evans Lance, MD;  Location: [facility] C[facility] LAB;  Service: Cardiovascular;  Laterality: N/A;  . Toe  surgery    . Colonoscopy    . Total knee arthroplasty Left 11/27/2014  . Knee arthroplasty Left 11/27/2014    Procedure: LEFT TOTAL KNEE ARTHROPLASTY;  Surgeon: Marybelle Killings, MD;  Location: [place];  Service: Orthopedics;  Laterality: Left;     Family History  Problem Relation Age of Onset  . Pancreatic cancer Mother   . Heart attack Father   . Diabetes Son   . Arthritis Brother     Severe/Crippling  . Tuberculosis Brother   . Hypertension Brother     x 2  . Heart disease Sister   . Diabetes Brother   . Seizures Brother   . Autoimmune disease Brother     AIDS  . Colon cancer Brother   . Lung cancer Brother     mets     Social History   Social History  . Marital Status: Single    Spouse Name: N/A  . Number of Children: 2  . Years of Education: college   Occupational History  . Retired    Social History Main Topics  . Smoking status: Former Smoker    Types: Cigarettes    Quit date: 01/19/1983  . Smokeless tobacco: Never Used  . Alcohol Use: No  . Drug Use: No  . Sexual Activity: Not on file   Other Topics Concern  . Not on file   Social History Narrative   Patient is single and lives alone.   Patient has two children.   Patient is retired.   Patient has a college education.   Patient is right-handed.   Patient does not drink any caffeine.     BP 128/58 mmHg  Pulse 93  Ht 5' 4.5" (1.638 m)  Wt 218 lb 9.6 oz (99.156 kg)  BMI 36.96 kg/m2  Physical Exam:  obese appearing 69 year old woman, NAD HEENT: Unremarkable Neck:  No JVD, no thyromegally Back:  No CVA tenderness Lungs:  Clear with no wheezes, rales, or rhonchi. HEART:  Regular rate rhythm, no murmurs, no rubs, no clicks Abd:  soft, positive bowel sounds, no organomegally, no rebound, no guarding Ext:  2 plus pulses, no edema, no cyanosis, no clubbing Skin:  No rashes no nodules Neuro:  CN II through XII intact, motor grossly intact  ILR interogation demonstrated no sustained SVT, atrial  fib, VT, or bradycardic episodes. She did have undersensing.   Assess/Plan:

## 2015-01-06 ENCOUNTER — Ambulatory Visit (INDEPENDENT_AMBULATORY_CARE_PROVIDER_SITE_OTHER): Payer: Medicare Other | Admitting: *Deleted

## 2015-01-06 DIAGNOSIS — H409 Unspecified glaucoma: Secondary | ICD-10-CM | POA: Diagnosis not present

## 2015-01-06 DIAGNOSIS — R55 Syncope and collapse: Secondary | ICD-10-CM

## 2015-01-06 DIAGNOSIS — Z471 Aftercare following joint replacement surgery: Secondary | ICD-10-CM | POA: Diagnosis not present

## 2015-01-06 DIAGNOSIS — G43111 Migraine with aura, intractable, with status migrainosus: Secondary | ICD-10-CM | POA: Diagnosis not present

## 2015-01-06 DIAGNOSIS — E1140 Type 2 diabetes mellitus with diabetic neuropathy, unspecified: Secondary | ICD-10-CM | POA: Diagnosis not present

## 2015-01-06 DIAGNOSIS — E1122 Type 2 diabetes mellitus with diabetic chronic kidney disease: Secondary | ICD-10-CM | POA: Diagnosis not present

## 2015-01-06 DIAGNOSIS — E785 Hyperlipidemia, unspecified: Secondary | ICD-10-CM | POA: Diagnosis not present

## 2015-01-06 DIAGNOSIS — Z96652 Presence of left artificial knee joint: Secondary | ICD-10-CM | POA: Diagnosis not present

## 2015-01-06 DIAGNOSIS — N189 Chronic kidney disease, unspecified: Secondary | ICD-10-CM | POA: Diagnosis not present

## 2015-01-06 DIAGNOSIS — I129 Hypertensive chronic kidney disease with stage 1 through stage 4 chronic kidney disease, or unspecified chronic kidney disease: Secondary | ICD-10-CM | POA: Diagnosis not present

## 2015-01-06 DIAGNOSIS — G4733 Obstructive sleep apnea (adult) (pediatric): Secondary | ICD-10-CM | POA: Diagnosis not present

## 2015-01-06 DIAGNOSIS — E669 Obesity, unspecified: Secondary | ICD-10-CM | POA: Diagnosis not present

## 2015-01-06 DIAGNOSIS — J45909 Unspecified asthma, uncomplicated: Secondary | ICD-10-CM | POA: Diagnosis not present

## 2015-01-06 DIAGNOSIS — G4701 Insomnia due to medical condition: Secondary | ICD-10-CM | POA: Diagnosis not present

## 2015-01-06 LAB — CUP PACEART REMOTE DEVICE CHECK: Date Time Interrogation Session: 20161117223829

## 2015-01-07 DIAGNOSIS — I4891 Unspecified atrial fibrillation: Secondary | ICD-10-CM | POA: Insufficient documentation

## 2015-01-07 NOTE — Assessment & Plan Note (Signed)
The patient was noted to have 12 minutes of atrial fib several weeks ago. She has had no other on her loop recorder. She was initially placed on both plavix and Eliquis. Her Eliquis was stopped by another provider. She has longstanding known CAD. Reviewing all data, it is my opinion that we could continue as she is. If she has more atrial fib, I would consider stopping Plavix and starting Eliquis or Xarelto back and starting low dose ASA.

## 2015-01-08 DIAGNOSIS — I129 Hypertensive chronic kidney disease with stage 1 through stage 4 chronic kidney disease, or unspecified chronic kidney disease: Secondary | ICD-10-CM | POA: Diagnosis not present

## 2015-01-08 DIAGNOSIS — E1122 Type 2 diabetes mellitus with diabetic chronic kidney disease: Secondary | ICD-10-CM | POA: Diagnosis not present

## 2015-01-08 DIAGNOSIS — E785 Hyperlipidemia, unspecified: Secondary | ICD-10-CM | POA: Diagnosis not present

## 2015-01-08 DIAGNOSIS — H409 Unspecified glaucoma: Secondary | ICD-10-CM | POA: Diagnosis not present

## 2015-01-08 DIAGNOSIS — Z96652 Presence of left artificial knee joint: Secondary | ICD-10-CM | POA: Diagnosis not present

## 2015-01-08 DIAGNOSIS — G43111 Migraine with aura, intractable, with status migrainosus: Secondary | ICD-10-CM | POA: Diagnosis not present

## 2015-01-08 DIAGNOSIS — Z471 Aftercare following joint replacement surgery: Secondary | ICD-10-CM | POA: Diagnosis not present

## 2015-01-08 DIAGNOSIS — G4701 Insomnia due to medical condition: Secondary | ICD-10-CM | POA: Diagnosis not present

## 2015-01-08 DIAGNOSIS — E669 Obesity, unspecified: Secondary | ICD-10-CM | POA: Diagnosis not present

## 2015-01-08 DIAGNOSIS — E1140 Type 2 diabetes mellitus with diabetic neuropathy, unspecified: Secondary | ICD-10-CM | POA: Diagnosis not present

## 2015-01-08 DIAGNOSIS — J45909 Unspecified asthma, uncomplicated: Secondary | ICD-10-CM | POA: Diagnosis not present

## 2015-01-08 DIAGNOSIS — G4733 Obstructive sleep apnea (adult) (pediatric): Secondary | ICD-10-CM | POA: Diagnosis not present

## 2015-01-08 DIAGNOSIS — N189 Chronic kidney disease, unspecified: Secondary | ICD-10-CM | POA: Diagnosis not present

## 2015-01-08 NOTE — Progress Notes (Signed)
Carelink Summary Report / Loop Recorder 

## 2015-01-09 DIAGNOSIS — E1122 Type 2 diabetes mellitus with diabetic chronic kidney disease: Secondary | ICD-10-CM | POA: Diagnosis not present

## 2015-01-09 DIAGNOSIS — I129 Hypertensive chronic kidney disease with stage 1 through stage 4 chronic kidney disease, or unspecified chronic kidney disease: Secondary | ICD-10-CM | POA: Diagnosis not present

## 2015-01-09 DIAGNOSIS — Z471 Aftercare following joint replacement surgery: Secondary | ICD-10-CM | POA: Diagnosis not present

## 2015-01-09 DIAGNOSIS — E1140 Type 2 diabetes mellitus with diabetic neuropathy, unspecified: Secondary | ICD-10-CM | POA: Diagnosis not present

## 2015-01-09 DIAGNOSIS — Z96652 Presence of left artificial knee joint: Secondary | ICD-10-CM | POA: Diagnosis not present

## 2015-01-09 DIAGNOSIS — G4733 Obstructive sleep apnea (adult) (pediatric): Secondary | ICD-10-CM | POA: Diagnosis not present

## 2015-01-09 DIAGNOSIS — E785 Hyperlipidemia, unspecified: Secondary | ICD-10-CM | POA: Diagnosis not present

## 2015-01-09 DIAGNOSIS — J45909 Unspecified asthma, uncomplicated: Secondary | ICD-10-CM | POA: Diagnosis not present

## 2015-01-09 DIAGNOSIS — N189 Chronic kidney disease, unspecified: Secondary | ICD-10-CM | POA: Diagnosis not present

## 2015-01-09 DIAGNOSIS — E669 Obesity, unspecified: Secondary | ICD-10-CM | POA: Diagnosis not present

## 2015-01-09 DIAGNOSIS — H409 Unspecified glaucoma: Secondary | ICD-10-CM | POA: Diagnosis not present

## 2015-01-09 DIAGNOSIS — G4701 Insomnia due to medical condition: Secondary | ICD-10-CM | POA: Diagnosis not present

## 2015-01-09 DIAGNOSIS — G43111 Migraine with aura, intractable, with status migrainosus: Secondary | ICD-10-CM | POA: Diagnosis not present

## 2015-01-15 ENCOUNTER — Other Ambulatory Visit: Payer: Self-pay | Admitting: Internal Medicine

## 2015-01-15 DIAGNOSIS — I129 Hypertensive chronic kidney disease with stage 1 through stage 4 chronic kidney disease, or unspecified chronic kidney disease: Secondary | ICD-10-CM | POA: Diagnosis not present

## 2015-01-15 DIAGNOSIS — Z96652 Presence of left artificial knee joint: Secondary | ICD-10-CM | POA: Diagnosis not present

## 2015-01-15 DIAGNOSIS — E785 Hyperlipidemia, unspecified: Secondary | ICD-10-CM | POA: Diagnosis not present

## 2015-01-15 DIAGNOSIS — G4701 Insomnia due to medical condition: Secondary | ICD-10-CM | POA: Diagnosis not present

## 2015-01-15 DIAGNOSIS — E1140 Type 2 diabetes mellitus with diabetic neuropathy, unspecified: Secondary | ICD-10-CM | POA: Diagnosis not present

## 2015-01-15 DIAGNOSIS — E669 Obesity, unspecified: Secondary | ICD-10-CM | POA: Diagnosis not present

## 2015-01-15 DIAGNOSIS — G43111 Migraine with aura, intractable, with status migrainosus: Secondary | ICD-10-CM | POA: Diagnosis not present

## 2015-01-15 DIAGNOSIS — G4733 Obstructive sleep apnea (adult) (pediatric): Secondary | ICD-10-CM | POA: Diagnosis not present

## 2015-01-15 DIAGNOSIS — J45909 Unspecified asthma, uncomplicated: Secondary | ICD-10-CM | POA: Diagnosis not present

## 2015-01-15 DIAGNOSIS — N189 Chronic kidney disease, unspecified: Secondary | ICD-10-CM | POA: Diagnosis not present

## 2015-01-15 DIAGNOSIS — E1122 Type 2 diabetes mellitus with diabetic chronic kidney disease: Secondary | ICD-10-CM | POA: Diagnosis not present

## 2015-01-15 DIAGNOSIS — Z471 Aftercare following joint replacement surgery: Secondary | ICD-10-CM | POA: Diagnosis not present

## 2015-01-15 DIAGNOSIS — H409 Unspecified glaucoma: Secondary | ICD-10-CM | POA: Diagnosis not present

## 2015-01-16 NOTE — Telephone Encounter (Signed)
Sent to pharmacy 

## 2015-01-22 ENCOUNTER — Other Ambulatory Visit: Payer: Self-pay | Admitting: Internal Medicine

## 2015-01-22 DIAGNOSIS — Z471 Aftercare following joint replacement surgery: Secondary | ICD-10-CM | POA: Diagnosis not present

## 2015-01-22 DIAGNOSIS — I129 Hypertensive chronic kidney disease with stage 1 through stage 4 chronic kidney disease, or unspecified chronic kidney disease: Secondary | ICD-10-CM | POA: Diagnosis not present

## 2015-01-22 DIAGNOSIS — G43111 Migraine with aura, intractable, with status migrainosus: Secondary | ICD-10-CM | POA: Diagnosis not present

## 2015-01-22 DIAGNOSIS — G4733 Obstructive sleep apnea (adult) (pediatric): Secondary | ICD-10-CM | POA: Diagnosis not present

## 2015-01-22 DIAGNOSIS — G4701 Insomnia due to medical condition: Secondary | ICD-10-CM | POA: Diagnosis not present

## 2015-01-22 DIAGNOSIS — E785 Hyperlipidemia, unspecified: Secondary | ICD-10-CM | POA: Diagnosis not present

## 2015-01-22 DIAGNOSIS — Z96652 Presence of left artificial knee joint: Secondary | ICD-10-CM | POA: Diagnosis not present

## 2015-01-22 DIAGNOSIS — N189 Chronic kidney disease, unspecified: Secondary | ICD-10-CM | POA: Diagnosis not present

## 2015-01-22 DIAGNOSIS — H409 Unspecified glaucoma: Secondary | ICD-10-CM | POA: Diagnosis not present

## 2015-01-22 DIAGNOSIS — E1122 Type 2 diabetes mellitus with diabetic chronic kidney disease: Secondary | ICD-10-CM | POA: Diagnosis not present

## 2015-01-22 DIAGNOSIS — J45909 Unspecified asthma, uncomplicated: Secondary | ICD-10-CM | POA: Diagnosis not present

## 2015-01-22 DIAGNOSIS — E1140 Type 2 diabetes mellitus with diabetic neuropathy, unspecified: Secondary | ICD-10-CM | POA: Diagnosis not present

## 2015-01-29 ENCOUNTER — Encounter: Payer: Self-pay | Admitting: Podiatry

## 2015-01-29 ENCOUNTER — Ambulatory Visit (INDEPENDENT_AMBULATORY_CARE_PROVIDER_SITE_OTHER): Payer: Medicare Other | Admitting: Podiatry

## 2015-01-29 DIAGNOSIS — B351 Tinea unguium: Secondary | ICD-10-CM

## 2015-01-29 DIAGNOSIS — M79676 Pain in unspecified toe(s): Secondary | ICD-10-CM

## 2015-01-29 NOTE — Patient Instructions (Signed)
Diabetes and Foot Care Diabetes may cause you to have problems because of poor blood supply (circulation) to your feet and legs. This may cause the skin on your feet to become thinner, break easier, and heal more slowly. Your skin may become dry, and the skin may peel and crack. You may also have nerve damage in your legs and feet causing decreased feeling in them. You may not notice minor injuries to your feet that could lead to infections or more serious problems. Taking care of your feet is one of the most important things you can do for yourself.  HOME CARE INSTRUCTIONS  Wear shoes at all times, even in the house. Do not go barefoot. Bare feet are easily injured.  Check your feet daily for blisters, cuts, and redness. If you cannot see the bottom of your feet, use a mirror or ask someone for help.  Wash your feet with warm water (do not use hot water) and mild soap. Then pat your feet and the areas between your toes until they are completely dry. Do not soak your feet as this can dry your skin.  Apply a moisturizing lotion or petroleum jelly (that does not contain alcohol and is unscented) to the skin on your feet and to dry, brittle toenails. Do not apply lotion between your toes.  Trim your toenails straight across. Do not dig under them or around the cuticle. File the edges of your nails with an emery board or nail file.  Do not cut corns or calluses or try to remove them with medicine.  Wear clean socks or stockings every day. Make sure they are not too tight. Do not wear knee-high stockings since they may decrease blood flow to your legs.  Wear shoes that fit properly and have enough cushioning. To break in new shoes, wear them for just a few hours a day. This prevents you from injuring your feet. Always look in your shoes before you put them on to be sure there are no objects inside.  Do not cross your legs. This may decrease the blood flow to your feet.  If you find a minor scrape,  cut, or break in the skin on your feet, keep it and the skin around it clean and dry. These areas may be cleansed with mild soap and water. Do not cleanse the area with peroxide, alcohol, or iodine.  When you remove an adhesive bandage, be sure not to damage the skin around it.  If you have a wound, look at it several times a day to make sure it is healing.  Do not use heating pads or hot water bottles. They may burn your skin. If you have lost feeling in your feet or legs, you may not know it is happening until it is too late.  Make sure your health care provider performs a complete foot exam at least annually or more often if you have foot problems. Report any cuts, sores, or bruises to your health care provider immediately. SEEK MEDICAL CARE IF:   You have an injury that is not healing.  You have cuts or breaks in the skin.  You have an ingrown nail.  You notice redness on your legs or feet.  You feel burning or tingling in your legs or feet.  You have pain or cramps in your legs and feet.  Your legs or feet are numb.  Your feet always feel cold. SEEK IMMEDIATE MEDICAL CARE IF:   There is increasing redness,   swelling, or pain in or around a wound.  There is a red line that goes up your leg.  Pus is coming from a wound.  You develop a fever or as directed by your health care provider.  You notice a bad smell coming from an ulcer or wound.   This information is not intended to replace advice given to you by your health care provider. Make sure you discuss any questions you have with your health care provider.   Document Released: 01/02/2000 Document Revised: 09/06/2012 Document Reviewed: 06/13/2012 Elsevier Interactive Patient Education 2016 [employer]vier Inc[employer]  

## 2015-01-29 NOTE — Progress Notes (Signed)
Patient ID: [name], female   DOB: 1945/12/06, 70 y.o.   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

## 2015-01-30 ENCOUNTER — Ambulatory Visit (INDEPENDENT_AMBULATORY_CARE_PROVIDER_SITE_OTHER): Payer: Medicare Other | Admitting: Obstetrics and Gynecology

## 2015-01-30 ENCOUNTER — Encounter: Payer: Self-pay | Admitting: Obstetrics and Gynecology

## 2015-01-30 ENCOUNTER — Other Ambulatory Visit: Payer: Self-pay | Admitting: Obstetrics and Gynecology

## 2015-01-30 VITALS — BP 136/77 | HR 93 | Temp 99.0°F | Wt 208.1 lb

## 2015-01-30 DIAGNOSIS — Z01419 Encounter for gynecological examination (general) (routine) without abnormal findings: Secondary | ICD-10-CM

## 2015-01-30 DIAGNOSIS — R300 Dysuria: Secondary | ICD-10-CM | POA: Diagnosis not present

## 2015-01-30 DIAGNOSIS — Z1231 Encounter for screening mammogram for malignant neoplasm of breast: Secondary | ICD-10-CM

## 2015-01-30 NOTE — Progress Notes (Signed)
Subjective:     [name] is a 70 y.o. female  With BMI 33 who is here for a comprehensive physical exam and the evaluation of a vaginal discharge. The patient reports the presence of a vaginal discharge intermittently for the past year. The discharge is always present but at times abundant in flow. She reports some occasional pruritis as well. Patient also reports some dysuria which developed a month ago. She also has experienced some bilateral breast pain for the past 6 months. The pain is worst in her left breast. She denies any changes in her breast or in her habits.   Past Medical History  Diagnosis Date  . Myocardial infarction [facility]) [place] Multi W/Spect W/Wall Motion EF on 08/07/10 - IMPRESSION: 1) No evidence of ischemia.  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  3) Normal LV wall motion.  4) Estimated Q G S EF 70%, unchanged.  . Asthma   . Blood transfusion   . GERD (gastroesophageal reflux disease)   . Hiatal hernia   . Other forms of migraine, without mention of intractable migraine without mention of status migrainosus   . Dry eye     Chronic  . Hypercholesteremia   . Spondylolisthesis   . Diverticulosis of colon (without mention of hemorrhage)     Colonoscopy 11/09/09 - FINDINGS: 1) 24mm polp in transverse colon, resected & retrieved.  2) Diverticulosis in sigmoid & descending colon.  3) Medium-sized, internal hemorrhoids.  . Internal hemorrhoids without mention of complication     Colonoscopy 11/09/09 - FINDINGS: 1) 23mm polp in transverse colon, resected & retrieved.  2) Diverticulosis in sigmoid & descending colon.  3) Medium-sized, internal hemorrhoids.  . Benign neoplasm of colon 11/09/09    Colonoscopy 11/09/09 - FINDINGS: 1) 28mm polp in transverse colon, resected & retrieved.  2) Diverticulosis in sigmoid & descending colon.  3) Medium-sized, internal hemorrhoids.  . Hemorrhage of rectum and anus    Colonoscopy 11/09/09 - FINDINGS: 1) 38mm polp in transverse colon, resected & retrieved.  2) Diverticulosis in sigmoid & descending colon.  3) Medium-sized, internal hemorrhoids.  . Essential hypertension, benign   . Esophagitis   . CMC arthritis     Left middle finger  . Depression     40+ years  . Diabetes mellitus 10/2008    type 2  . Breast cancer ([place]) 1989    Left breast cancer  . Obese   . Syncope     MDT LinQ implanted for evaluation  . Facet arthropathy, lumbar     L3-4  . Obstructive sleep apnea     uses oxygen at night  . Allergy   . Anemia   . Arthritis   . Blood transfusion without reported diagnosis   . Cataract   . Glaucoma   . Oxygen deficiency     .2  . Dysrhythmia   . Anginal pain ([place])   . Shortness of breath dyspnea   . Chronic kidney disease     states she has a cyst on her kidney   Past Surgical History  Procedure Laterality Date  . Abdominal hysterectomy  1978  . Joint replacement      lsft hip x2, rt knee  . Knee arthroscopy Right 2002    By Dr. Lorin Mercy  . Foot surgery Bilateral     Bil feet great toes and second toes spurs. By Dr. Milinda Pointer in 2004.  Marland Kitchen  Breast biopsy Left 1995    Biopsy at [facility] ([facility])  . Tonsillectomy    . Back surgery      x2 one for infection  . Cholecystectomy    . Total hip revision  12/14/2010    Procedure: TOTAL HIP REVISION;  Surgeon: Marybelle Killings;  Location: [place];  Service: Orthopedics;  Laterality: Left;  Left Total Hip Arthroplasty Revision, Poly and Ball Exchange, Possible Acetabular Revision vs. Poly Exchange  . Colonoscopy w/ polypectomy  11/09/09    FINDINGS: 1) 3mm polp in transverse colon, resected & retrieved.  2) Diverticulosis in sigmoid & descending colon.  3) Medium-sized, internal hemorrhoids.  . Loop recorder implant  11/15/06; 04/02/2013    MDT Reveal implanted 10/2006 for syncope; removed with MDT LinQ implanted 03/2013 by Dr Lovena Le  . Hemorrhoid surgery  2011    Internal &  external hemorrhoidectomy (x) 3 quadrants  . Esophagogastroduodenoscopy  02/15/07  . Dilation and curettage of uterus    . Trigger finger release Left 01/02/2013    Procedure: RELEASE LEFT MIDDLE TRIGGER FINGER/LEFT INDEX AND LEFT RING FINGER;  Surgeon: Wynonia Sours, MD;  Location: [place];  Service: Orthopedics;  Laterality: Left;  ANESTHESIA: GENERAL/IV REGIONAL FAB  . Loop recorder implant N/A 04/02/2013    Procedure: LOOP RECORDER IMPLANT;  Surgeon: Evans Lance, MD;  Location: [facility] CATH LAB;  Service: Cardiovascular;  Laterality: N/A;  . Toe surgery    . Colonoscopy    . Total knee arthroplasty Left 11/27/2014  . Knee arthroplasty Left 11/27/2014    Procedure: LEFT TOTAL KNEE ARTHROPLASTY;  Surgeon: Marybelle Killings, MD;  Location: [place];  Service: Orthopedics;  Laterality: Left;   Family History  Problem Relation Age of Onset  . Pancreatic cancer Mother   . Heart attack Father   . Diabetes Son   . Arthritis Brother     Severe/Crippling  . Tuberculosis Brother   . Hypertension Brother     x 2  . Heart disease Sister   . Diabetes Brother   . Seizures Brother   . Autoimmune disease Brother     AIDS  . Colon cancer Brother   . Lung cancer Brother     mets    Social History   Social History  . Marital Status: Single    Spouse Name: N/A  . Number of Children: 2  . Years of Education: college   Occupational History  . Retired    Social History Main Topics  . Smoking status: Former Smoker    Types: Cigarettes    Quit date: 01/19/1983  . Smokeless tobacco: Never Used  . Alcohol Use: No  . Drug Use: No  . Sexual Activity: Not on file   Other Topics Concern  . Not on file   Social History Narrative   Patient is single and lives alone.   Patient has two children.   Patient is retired.   Patient has a college education.   Patient is right-handed.   Patient does not drink any caffeine.   Health Maintenance  Topic Date Due  . Hepatitis C Screening   [birth year]  . ZOSTAVAX  04/16/2007  . DEXA SCAN  04/15/2012  . PNA vac Low Risk Adult (1 of 2 - PCV13) 08/13/2015 (Originally 04/15/2012)  . INFLUENZA VACCINE  12/24/2015 (Originally 08/19/2014)  . HEMOGLOBIN A1C  05/27/2015  . OPHTHALMOLOGY EXAM  08/02/2015  . FOOT EXAM  08/13/2015  . MAMMOGRAM  11/13/2015  .  COLONOSCOPY  09/09/2024  . TETANUS/TDAP  12/24/2024       Review of Systems Pertinent items are noted in HPI.   Objective:      GENERAL: Well-developed, well-nourished female in no acute distress.  HEENT: Normocephalic, atraumatic. Sclerae anicteric.  NECK: Supple. Normal thyroid.  LUNGS: Clear to auscultation bilaterally.  HEART: Regular rate and rhythm. BREASTS: Symmetric in size. No palpable masses or lymphadenopathy, skin changes, or nipple drainage. Bilateral peri-aureolar pain. ABDOMEN: Soft, nontender, nondistended. No organomegaly. PELVIC: Normal external female genitalia. Vagina is atrophic. Vaginal vault intact. Normal discharge. No adnexal mass or tenderness. EXTREMITIES: No cyanosis, clubbing, or edema, 2+ distal pulses.    Assessment:    Healthy female exam.      Plan:    patient without a history of abnormal pap smear- Pap smear not indicated and not done at today's visit Screening mammogram ordered Wet prep collected Urine culture also sent Patient advised to perform monthly self breast and vulva exams Patient will be contacted with any abnormal results See After Visit Summary for Counseling Recommendations

## 2015-01-31 ENCOUNTER — Telehealth: Payer: Self-pay

## 2015-01-31 LAB — WET PREP, GENITAL: YEAST WET PREP: NONE SEEN

## 2015-01-31 MED ORDER — METRONIDAZOLE 500 MG PO TABS: 500.0000 mg | ORAL_TABLET | Freq: Two times a day (BID) | ORAL | Status: DC

## 2015-01-31 NOTE — Addendum Note (Signed)
Addended by: Mora Bellman on: 01/31/2015 08:41 AM   Modules accepted: Orders

## 2015-01-31 NOTE — Telephone Encounter (Signed)
Called pt per Dr. Elly Modena request to inform her of her results from previous Wet Prep. Informed pt that she was positive for BV &  Trich and that her Dr had prescribed her flagyl to help treat this diagnosis. Pt questioned how she got Trich if she is not sexually active & after further review with the Dr I was able to tell the patient that it is more common in the elderly population to have Trich maybe from towels or the way in which she uses the toilet. Asked if she had any futhrer questions and she stated no further questions.

## 2015-02-01 LAB — URINE CULTURE: Colony Count: >=100000

## 2015-02-02 DIAGNOSIS — J45998 Other asthma: Secondary | ICD-10-CM | POA: Diagnosis not present

## 2015-02-03 ENCOUNTER — Ambulatory Visit (INDEPENDENT_AMBULATORY_CARE_PROVIDER_SITE_OTHER): Payer: Medicare Other | Admitting: *Deleted

## 2015-02-03 ENCOUNTER — Other Ambulatory Visit: Payer: Self-pay | Admitting: Geriatric Medicine

## 2015-02-03 ENCOUNTER — Telehealth: Payer: Self-pay | Admitting: Internal Medicine

## 2015-02-03 DIAGNOSIS — R55 Syncope and collapse: Secondary | ICD-10-CM | POA: Diagnosis not present

## 2015-02-03 MED ORDER — SULFAMETHOXAZOLE-TRIMETHOPRIM 800-160 MG PO TABS: 1.0000 | ORAL_TABLET | Freq: Two times a day (BID) | ORAL | Status: DC

## 2015-02-03 MED ORDER — TOPIRAMATE 25 MG PO TABS: 25.0000 mg | ORAL_TABLET | Freq: Every morning | ORAL | Status: DC

## 2015-02-03 NOTE — Telephone Encounter (Signed)
Patient is requesting a refill of topiramate (TOPAMAX) 25 MG tablet [id number] sent to pharmacy listed

## 2015-02-03 NOTE — Telephone Encounter (Signed)
Sent to pharmacy 

## 2015-02-03 NOTE — Addendum Note (Signed)
Addended by: Mora Bellman on: 02/03/2015 02:57 PM   Modules accepted: Orders

## 2015-02-04 NOTE — Progress Notes (Signed)
Carelink Summary Report / Loop Recorder 

## 2015-02-07 ENCOUNTER — Ambulatory Visit: Payer: Self-pay

## 2015-02-11 ENCOUNTER — Ambulatory Visit: Payer: Self-pay | Admitting: Internal Medicine

## 2015-02-11 DIAGNOSIS — H40013 Open angle with borderline findings, low risk, bilateral: Secondary | ICD-10-CM | POA: Diagnosis not present

## 2015-02-11 DIAGNOSIS — H16223 Keratoconjunctivitis sicca, not specified as Sjogren's, bilateral: Secondary | ICD-10-CM | POA: Diagnosis not present

## 2015-02-12 ENCOUNTER — Other Ambulatory Visit: Payer: Self-pay

## 2015-02-12 MED ORDER — NITROGLYCERIN 0.4 MG/SPRAY TL SOLN: 1.0000 | Status: DC | PRN

## 2015-02-17 ENCOUNTER — Other Ambulatory Visit (INDEPENDENT_AMBULATORY_CARE_PROVIDER_SITE_OTHER): Payer: Medicare Other

## 2015-02-17 ENCOUNTER — Other Ambulatory Visit: Payer: Self-pay

## 2015-02-17 DIAGNOSIS — N390 Urinary tract infection, site not specified: Secondary | ICD-10-CM | POA: Diagnosis not present

## 2015-02-17 LAB — POCT URINALYSIS DIP (DEVICE)
BILIRUBIN URINE: NEGATIVE
Glucose, UA: NEGATIVE mg/dL
HGB URINE DIPSTICK: NEGATIVE
KETONES UR: NEGATIVE mg/dL
Leukocytes, UA: NEGATIVE
Nitrite: NEGATIVE
PH: 5.5 (ref 5.0–8.0)
Protein, ur: NEGATIVE mg/dL
SPECIFIC GRAVITY, URINE: 1.02 (ref 1.005–1.030)
Urobilinogen, UA: 0.2 mg/dL (ref 0.0–1.0)

## 2015-02-17 NOTE — Progress Notes (Signed)
Pt here today for questionable UTI.  Pt states that she has taken antibiotics, the sx she had before has subsided but still there.  UA came back normal.  Per Dr. Elly Modena recommendations, we send pt's urine for culture to see if anything is abnormal.  Informed pt to also give her antibiotics another week or so to see if will take of the symptom that she is having since her sx have been relieved.  I informed pt that if she does not hear back from [place] that means her urine culture came back negative, to give it another week to watch for symptom relief, and that if it is not better to please contact PCP because she may need a referral to Urology.  Pt agreed to recommendations and had no further questions.

## 2015-02-18 ENCOUNTER — Other Ambulatory Visit: Payer: Self-pay

## 2015-02-19 ENCOUNTER — Ambulatory Visit: Payer: Self-pay

## 2015-02-19 ENCOUNTER — Other Ambulatory Visit: Payer: Self-pay | Admitting: Internal Medicine

## 2015-02-19 LAB — URINE CULTURE

## 2015-02-21 ENCOUNTER — Ambulatory Visit
Admission: RE | Admit: 2015-02-21 | Discharge: 2015-02-21 | Disposition: A | Discharge status: Home / Self Care | Payer: Medicare Other | Source: Ambulatory Visit | Attending: Obstetrics and Gynecology | Admitting: Obstetrics and Gynecology

## 2015-02-21 DIAGNOSIS — Z1231 Encounter for screening mammogram for malignant neoplasm of breast: Secondary | ICD-10-CM

## 2015-02-21 LAB — CUP PACEART REMOTE DEVICE CHECK: Date Time Interrogation Session: 20161217230745

## 2015-02-25 ENCOUNTER — Telehealth: Payer: Self-pay | Admitting: Internal Medicine

## 2015-02-25 DIAGNOSIS — R300 Dysuria: Secondary | ICD-10-CM

## 2015-02-25 NOTE — Telephone Encounter (Signed)
Notified pt md place referral will be contacted once appt has been set-up...[name]

## 2015-02-25 NOTE — Telephone Encounter (Signed)
Pt was referred to [facility] and they discovered she had a UTI. They gave her antibiotics and when she was done with those she was still having painful urination. She went back to the [facility] and they said the infection had cleared up. They told her to wait about a week and if not better to call us to get a referral to a urologist. Her urination is still painful. Do you want me to make an appt for her to see you or just make the referral. Please advise She can be reached at [phone]

## 2015-02-25 NOTE — Telephone Encounter (Signed)
Placed referral  

## 2015-03-03 ENCOUNTER — Encounter: Payer: Self-pay | Admitting: Internal Medicine

## 2015-03-05 ENCOUNTER — Ambulatory Visit (INDEPENDENT_AMBULATORY_CARE_PROVIDER_SITE_OTHER): Payer: Medicare Other | Admitting: *Deleted

## 2015-03-05 DIAGNOSIS — J45998 Other asthma: Secondary | ICD-10-CM | POA: Diagnosis not present

## 2015-03-05 DIAGNOSIS — R55 Syncope and collapse: Secondary | ICD-10-CM | POA: Diagnosis not present

## 2015-03-06 NOTE — Progress Notes (Signed)
Carelink Summary Report / Loop Recorder 

## 2015-03-07 DIAGNOSIS — E1165 Type 2 diabetes mellitus with hyperglycemia: Secondary | ICD-10-CM | POA: Diagnosis not present

## 2015-03-18 ENCOUNTER — Telehealth: Payer: Self-pay | Admitting: Internal Medicine

## 2015-03-18 DIAGNOSIS — M1712 Unilateral primary osteoarthritis, left knee: Secondary | ICD-10-CM | POA: Diagnosis not present

## 2015-03-18 MED ORDER — ATORVASTATIN CALCIUM 40 MG PO TABS: 40.0000 mg | ORAL_TABLET | Freq: Every day | ORAL | Status: DC

## 2015-03-18 MED ORDER — SAXAGLIPTIN-METFORMIN ER 5-1000 MG PO TB24: 1.0000 | ORAL_TABLET | Freq: Every day | ORAL | Status: DC

## 2015-03-18 NOTE — Telephone Encounter (Signed)
Should this patient be taking Kombiglyze? Please advise, thanks.

## 2015-03-18 NOTE — Telephone Encounter (Signed)
[facility] called to follow up on a refill request they sent over for atorvastatin (LIPITOR) 40 MG tablet [id number] nitroGLYCERIN (NITROLINGUAL) 0.4 MG/SPRAY spray [id number] and Kombiglyze XR 5 mg - 1000MG  - this was not on her current med list

## 2015-03-18 NOTE — Telephone Encounter (Signed)
Nitro should come from her cardiologist as they need to know if she is using it. Sent in [place] and kombiglyze.

## 2015-03-19 NOTE — Telephone Encounter (Signed)
Patient aware.

## 2015-03-20 ENCOUNTER — Other Ambulatory Visit: Payer: Self-pay | Admitting: Internal Medicine

## 2015-03-21 ENCOUNTER — Other Ambulatory Visit: Payer: Self-pay | Admitting: *Deleted

## 2015-03-21 MED ORDER — NITROGLYCERIN 0.4 MG/SPRAY TL SOLN: 1.0000 | Status: DC | PRN

## 2015-03-21 NOTE — Telephone Encounter (Signed)
Please advise, thanks.

## 2015-03-21 NOTE — Telephone Encounter (Signed)
[facility].../lmb 

## 2015-03-27 LAB — CUP PACEART REMOTE DEVICE CHECK: MDC IDC SESS DTM: 20170116233537

## 2015-04-02 DIAGNOSIS — J45998 Other asthma: Secondary | ICD-10-CM | POA: Diagnosis not present

## 2015-04-02 LAB — CUP PACEART REMOTE DEVICE CHECK: Date Time Interrogation Session: 20170215233714

## 2015-04-02 NOTE — Progress Notes (Signed)
Carelink summary report received. Battery status OK. Normal device function. No new symptom episodes, tachy episodes, brady, or pause episodes. No new AF episodes. Monthly summary reports and ROV/PRN 

## 2015-04-04 ENCOUNTER — Ambulatory Visit (INDEPENDENT_AMBULATORY_CARE_PROVIDER_SITE_OTHER): Payer: Medicare Other | Admitting: *Deleted

## 2015-04-04 DIAGNOSIS — R55 Syncope and collapse: Secondary | ICD-10-CM

## 2015-04-07 DIAGNOSIS — R300 Dysuria: Secondary | ICD-10-CM | POA: Diagnosis not present

## 2015-04-07 DIAGNOSIS — Z0000 Encounter for general adult medical examination without abnormal findings: Secondary | ICD-10-CM | POA: Diagnosis not present

## 2015-04-07 DIAGNOSIS — R3914 Feeling of incomplete bladder emptying: Secondary | ICD-10-CM | POA: Diagnosis not present

## 2015-04-08 NOTE — Progress Notes (Signed)
Carelink Summary Report / Loop Recorder 

## 2015-04-14 ENCOUNTER — Encounter: Payer: Self-pay | Admitting: Neurology

## 2015-04-14 ENCOUNTER — Ambulatory Visit (INDEPENDENT_AMBULATORY_CARE_PROVIDER_SITE_OTHER): Payer: Medicare Other | Admitting: Neurology

## 2015-04-14 DIAGNOSIS — G43019 Migraine without aura, intractable, without status migrainosus: Secondary | ICD-10-CM | POA: Diagnosis not present

## 2015-04-14 MED ORDER — AMITRIPTYLINE HCL 50 MG PO TABS: ORAL_TABLET | ORAL | Status: DC

## 2015-04-14 MED ORDER — GABAPENTIN 300 MG PO CAPS: ORAL_CAPSULE | ORAL | Status: DC

## 2015-04-14 NOTE — Progress Notes (Signed)
Subjective:    Patient ID: [name] is a 70 y.o. female.  HPI  Interim history: 04-14-15, CD [name] underwent a left knee replacement and is currently using a walker, surgery date was 11/27/2014 for total knee replacement. After I had seen her last 6 months ago I had ordered an EEG to find out if her sudden falls were related to a central nervous system the patient has a history of myocardial infarction, asthma, hiatal hernia, essential hypertension, depression and frequent headaches.  EEG was normal for the patient's age and gender. She has not fallen since she had her knee replaced, she has gotten only lightheaded a couple of times but never fell. I would like to add that she endorsed the geriatric depression score today at 8 points. And since last being seen she also had only 3 migraines so she's overall doing well and only one of these migraines with severe.    10-15-14 about 3 months ago,  while in church,  she suffered a syncope spell. She describes her spells as follows ; she was standing at the pulpit for church, she started feeling lightheaded and her heart was beating fast  , the warmth was like a wave, the surrounding  begun to spin and she was able to tell her [name] that she probably will faint or pass out.  When he guided her to the bottom step  of the 3 stair podium, she collapsed.  She fainted, soon after she regained consciousness, finding herself at the bottom of the steps.  She seems to have spells with LOC and a duration of  10-15 seconds, brief.  Her skin is clammy and she will be diaphoretic , nauseated and her chest tight. She never suffered a tongue bite , any kind of tonic-clonic activity or loss contour of bowel or bladder.  She further stated that her last spell in church was followed by a hyperventilation and  anxiety attack. [name] seemed to have felt that the patient had seizures, which the  patient strongly denies. The patient also had back surgery in 2013, has  a long-standing history of migrainous headaches that have become more frequent, in spite of being on Topiramate, in the past her HA responded to amitryptiline.  She has a history of coronary artery disease and myocardial infarction in 89, asthma, gastroparesis, diabetes mellitus, BMI over 40, obstructive sleep apnea, she recently had an MRI of her spine, lumbar, with [name] but has not been told  the results yet. [name] reports that  Amitryptiline was not longer covered by her [employer] and that this has caused her great distress- as her headaches were better controlled than she was able to use " elavil". Her fainting spells could be related to diabetic dysautonomia.   Last 2 visits with [name] and Dr Erling [name]:   [name] is a 70 year old right-handed woman who presents for followup consultation of her fainting episodes. She used to follow with [name] and started having suspected fainting episodes in August 2000 age. She has an underlying medical history of obesity, asthma, hypertension, angina, diabetes, breast cancer, heart disease, hyperlipidemia, arthritis and obstructive sleep apnea. She was last seen by [name] in May 2013, at which time he felt that she was having dizzy episodes that were not consistent with seizures. He suggested a one-year followup. She is on Valium, Plavix, losartan, Restasis, hydrocodone as needed, Plavix, Lipitor, metformin, choric, amitriptyline, Topamax, Ambien.Workup in the past  has included MRI of brain in September 2008 which was normal, MR a head in September 2008 which was normal, EEG in September 2008 which showed left frontotemporal sharp waves. She was started on Topamax in March 2009 and was admitted in January 2009 for syncope and atypical chest pain. Dr Erling [name]  felt she had a Hx of Sz.  She reports that she had 2 passing out spells in the past year, one episode was 3 months ago at church, when she went to the bathroom and then she had  another episode at home. She lives alone. She has sleep apnea, but does not use a CPAP machine, she uses oxygen at night. She has an aid 4 days a week for 2:15 hours each time.  She has several church friends that have keys to her apartment. Her son lives in [place] and her daughter lives in [place],  [place]. She denies any recent seizure like episodes. She has LOC usually for 2-5 minutes. No Hx of tongue bite or bladder incontinence. She had back surgery last year. She has a longstanding Hx of migraines and has had more HAs. [name] felt a strong correlation between resurgence of her migraine headaches and the discontinuation of amitriptyline.  I would like to reinstate this medication 4. Her amitriptyline 50 mg by mouth at bedtime was discontinued in October she states I would like to reintroduce the medication today. Since the patient is a history of morbid obesity, diabetes mellitus, and obstructive sleep apnea it is critical that she is not taking respiratory suppressants, sedative medication. Valium was prescribed by [name] she states for muscle spasms she has to be very careful with this medication not to enter was respiratory drive reduction. The same for the Hydrocort on oxycodone. To a lesser degree the same for Ambien. She has een Dr Rexene [name] in 2014 and asked to follow up with me , which i will permit.   I have not changed, refilled or altered  any of the medications - again the patient is on Topiramate at this time but feels it has not given her the relief she needs.  She should be able to wean gradually off.   10-15-14  The patient's former primary care and cardiologist provider [name] had seen the patient when amitriptyline and fell off her insurance approved formulary. He had tried another medication but to no avail. The patient has returned to take amitriptyline and now and her migraines have improved for almost 3 years now.  she has had  more breakthrough migraines over the last  7 months or so and she would like to dose increased.  They started mildly 7 months ago but that has been have progressed since then she was almost 3 years headache free. She had 2 "strange "headaches', one of the headaches lasted a few hours and involved the area at the occipital groove at the nape of the neck. She felt that this was a headache almost horizontal from ear to ear. A second headache arose to the top of the corona and only stayed for less than an hour.  Both spells were associated with phonophobia but not photo phobia and she was nauseated with either one of them.   Se endorsed a geriatric depression score at 11 points out of 15. That is significant. An antidepressant is used to treat her condition already.      Her Past Medical History Is Significant For: Past Medical History  Diagnosis Date  . Myocardial  infarction [facility]) [place] Multi W/Spect W/Wall Motion EF on 08/07/10 - IMPRESSION: 1) No evidence of ischemia.  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  3) Normal LV wall motion.  4) Estimated Q G S EF 70%, unchanged.  . Asthma   . Blood transfusion   . GERD (gastroesophageal reflux disease)   . Hiatal hernia   . Other forms of migraine, without mention of intractable migraine without mention of status migrainosus   . Dry eye     Chronic  . Hypercholesteremia   . Spondylolisthesis   . Diverticulosis of colon (without mention of hemorrhage)     Colonoscopy 11/09/09 - FINDINGS: 1) 14mm polp in transverse colon, resected & retrieved.  2) Diverticulosis in sigmoid & descending colon.  3) Medium-sized, internal hemorrhoids.  . Internal hemorrhoids without mention of complication     Colonoscopy 11/09/09 - FINDINGS: 1) 50mm polp in transverse colon, resected & retrieved.  2) Diverticulosis in sigmoid & descending colon.  3) Medium-sized, internal hemorrhoids.  . Benign neoplasm of colon 11/09/09    Colonoscopy 11/09/09 -  FINDINGS: 1) 38mm polp in transverse colon, resected & retrieved.  2) Diverticulosis in sigmoid & descending colon.  3) Medium-sized, internal hemorrhoids.  . Hemorrhage of rectum and anus     Colonoscopy 11/09/09 - FINDINGS: 1) 75mm polp in transverse colon, resected & retrieved.  2) Diverticulosis in sigmoid & descending colon.  3) Medium-sized, internal hemorrhoids.  . Essential hypertension, benign   . Esophagitis   . CMC arthritis     Left middle finger  . Depression     40+ years  . Diabetes mellitus 10/2008    type 2  . Breast cancer ([place]) 1989    Left breast cancer  . Obese   . Syncope     MDT LinQ implanted for evaluation  . Facet arthropathy, lumbar     L3-4  . Obstructive sleep apnea     uses oxygen at night  . Allergy   . Anemia   . Arthritis   . Blood transfusion without reported diagnosis   . Cataract   . Glaucoma   . Oxygen deficiency     .2  . Dysrhythmia   . Anginal pain ([place])   . Shortness of breath dyspnea   . Chronic kidney disease     states she has a cyst on her kidney   Her Past Surgical History Is Significant For: Past Surgical History  Procedure Laterality Date  . Abdominal hysterectomy  1978  . Joint replacement      lsft hip x2, rt knee  . Knee arthroscopy Right 2002    By Dr. Lorin Mercy  . Foot surgery Bilateral     Bil feet great toes and second toes spurs. By Dr. Milinda Pointer in 2004.  . Breast biopsy Left 1995    Biopsy at [facility]  . Tonsillectomy    . Back surgery      x2 one for infection  . Cholecystectomy    . Total hip revision  12/14/2010    Procedure: TOTAL HIP REVISION;  Surgeon: Marybelle Killings;  Location: [place];  Service: Orthopedics;  Laterality: Left;  Left Total Hip Arthroplasty Revision, Poly and Ball Exchange, Possible Acetabular Revision vs. Poly Exchange  . Colonoscopy w/ polypectomy  11/09/09    FINDINGS: 1) 64mm polp in transverse colon, resected & retrieved.  2) Diverticulosis in sigmoid &  descending  colon.  3) Medium-sized, internal hemorrhoids.  . Loop recorder implant  11/15/06; 04/02/2013    MDT Reveal implanted 10/2006 for syncope; removed with MDT LinQ implanted 03/2013 by Dr Lovena Le  . Hemorrhoid surgery  2011    Internal & external hemorrhoidectomy (x) 3 quadrants  . Esophagogastroduodenoscopy  02/15/07  . Dilation and curettage of uterus    . Trigger finger release Left 01/02/2013    Procedure: RELEASE LEFT MIDDLE TRIGGER FINGER/LEFT INDEX AND LEFT RING FINGER;  Surgeon: Wynonia Sours, MD;  Location: [facility];  Service: Orthopedics;  Laterality: Left;  ANESTHESIA: GENERAL/IV REGIONAL FAB  . Loop recorder implant N/A 04/02/2013    Procedure: LOOP RECORDER IMPLANT;  Surgeon: Evans Lance, MD;  Location: [facility] CATH LAB;  Service: Cardiovascular;  Laterality: N/A;  . Toe surgery    . Colonoscopy    . Total knee arthroplasty Left 11/27/2014  . Knee arthroplasty Left 11/27/2014    Procedure: LEFT TOTAL KNEE ARTHROPLASTY;  Surgeon: Marybelle Killings, MD;  Location: [place];  Service: Orthopedics;  Laterality: Left;   Her Family History Is Significant For: Family History  Problem Relation Age of Onset  . Pancreatic cancer Mother   . Heart attack Father   . Diabetes Son   . Arthritis Brother     Severe/Crippling  . Tuberculosis Brother   . Hypertension Brother     x 2  . Heart disease Sister   . Diabetes Brother   . Seizures Brother   . Autoimmune disease Brother     AIDS  . Colon cancer Brother   . Lung cancer Brother     mets    Her Social History Is Significant For: Social History   Social History  . Marital Status: Single    Spouse Name: N/A  . Number of Children: 2  . Years of Education: college   Occupational History  . Retired    Social History Main Topics  . Smoking status: Former Smoker    Types: Cigarettes    Quit date: 01/19/1983  . Smokeless tobacco: Never Used  . Alcohol Use: No  . Drug Use: No  . Sexual Activity: Not Asked   Other Topics  Concern  . None   Social History Narrative   Patient is single and lives alone.   Patient has two children.   Patient is retired.   Patient has a college education.   Patient is right-handed.   Patient does not drink any caffeine.    Her Allergies Are:  Allergies  Allergen Reactions  . Contrast Media [Iodinated Diagnostic Agents] Anaphylaxis  . Gadolinium Anaphylaxis       . Aspirin Nausea And Vomiting    Fast heart rate  . Iohexol Other (See Comments)    unknown  . Trazodone And Nefazodone     Dizziness,lightheadness, heart felt funny  :  Her Current Medications Are:  Outpatient Encounter Prescriptions as of 04/14/2015  Medication Sig  . amitriptyline (ELAVIL) 50 MG tablet Take one table by mouth every night at bedtime.  Marland Kitchen atorvastatin (LIPITOR) 40 MG tablet Take 1 tablet (40 mg total) by mouth daily.  . cholecalciferol (VITAMIN D) 1000 UNITS tablet Take 1,000 Units by mouth daily.  . clopidogrel (PLAVIX) 75 MG tablet Take 75 mg by mouth daily.  . cycloSPORINE (RESTASIS) 0.05 % ophthalmic emulsion Place 1 drop into both eyes 2 (two) times daily.   Marland Kitchen DEXILANT 60 MG capsule TAKE 1 CAPSULE BY MOUTH ONCE  DAILY  . diazepam (VALIUM) 5 MG tablet TAKE 1 TABLET BY MOUTH TWICE DAILY FOR MUSCLE SPASMS  . diltiazem (CARDIZEM CD) 180 MG 24 hr capsule TAKE 1 CAPSULE BY MOUTH ONCE DAILY  . FIBER SELECT GUMMIES PO Take 2 capsules by mouth daily.  . Fluocinolone Acetonide 0.01 % OIL Place 2 drops into both ears daily as needed. Does every two weeks  . fluticasone-salmeterol (ADVAIR HFA) 115-21 MCG/ACT inhaler Inhale 2 puffs into the lungs 2 (two) times daily.  Marland Kitchen gabapentin (NEURONTIN) 300 MG capsule Take 2 capsules of 300 mg each at night by mouth between one and 2 hours prior to bedtime. You can take another 300 mg in daytime for pain.  Marland Kitchen ipratropium (ATROVENT) 0.06 % nasal spray Place 2 sprays into both nostrils 3 (three) times daily.  . isosorbide mononitrate (IMDUR) 60 MG 24 hr tablet  TAKE 1 TABLET BY MOUTH ONCE DAILY  . losartan (COZAAR) 100 MG tablet TAKE 1 TABLET BY MOUTH EVERY MORNING  . methocarbamol (ROBAXIN) 500 MG tablet Take 1 tablet (500 mg total) by mouth every 6 (six) hours as needed for muscle spasms.  . metoCLOPramide (REGLAN) 10 MG tablet Take 10 mg by mouth 4 (four) times daily -  before meals and at bedtime.  . metoCLOPramide (REGLAN) 10 MG tablet TAKE 1 TABLET BY MOUTH BEFORE EACH MEAL AND AT BEDTIME  . metroNIDAZOLE (FLAGYL) 500 MG tablet Take 1 tablet (500 mg total) by mouth 2 (two) times daily.  . nitroGLYCERIN (NITROLINGUAL) 0.4 MG/SPRAY spray Place 1 spray under the tongue every 5 (five) minutes as needed. Chest pains  . olopatadine (PATANOL) 0.1 % ophthalmic solution Insert 1 drop in both eyes twice daily  . oxyCODONE-acetaminophen (ROXICET) 5-325 MG tablet Take 1 tablet by mouth every 4 (four) hours as needed for severe pain.  . polyethylene glycol (MIRALAX / GLYCOLAX) packet Take 17 g by mouth daily as needed for mild constipation.  Marland Kitchen PROAIR HFA 108 (90 Base) MCG/ACT inhaler INHALE 2 PUFFS BY MOUTH EVERY 6 HOURS FOR WHEEZING AND SHORTNESS OF BREATH  . PROAIR HFA 108 (90 Base) MCG/ACT inhaler INHALE 2 PUFFS BY MOUTH EVERY 6 HOURS FOR WHEEZING AND SHORTNESS OF BREATH  . Saxagliptin-Metformin 05-998 MG TB24 Take 1 tablet by mouth daily. Take one tablet every evening with a meal.  . sulfamethoxazole-trimethoprim (BACTRIM DS,SEPTRA DS) 800-160 MG tablet Take 1 tablet by mouth 2 (two) times daily.  Marland Kitchen topiramate (TOPAMAX) 100 MG tablet TAKE 1 TABLET BY MOUTH EVERY EVENING  . topiramate (TOPAMAX) 25 MG tablet Take 1 tablet (25 mg total) by mouth every morning.   No facility-administered encounter medications on file as of 04/14/2015.  :  Review of Systems   The patient endorsed a geriatric depression scale at 9 points there was no sleepiness assessment given tremor or fatigue assessment there is no headache diary provided. Review of systems is widely endures  before fatigue, neck stiffness, hearing loss, ear pain, shortness of breath and chest tightness, I pain, light sensitivity and itching of the eye, chest pain palpitations, abdominal pain, rectal bleeding and black stools, constipation and nausea, insomnia, daytime sleepiness, anemia, dizziness, headache, passing out spells and depression.    General: The patient is awake, alert and appears not in acute distress. The patient is well groomed. Head: Normocephalic, atraumatic.  Neck is supple. Mallampati 3, I was unable to see a uvula. , no TMJ and no retrognathia.  neck circumference: 16 inches.  Cardiovascular:  Regular rate and rhythm,  without  murmurs or carotid bruit, and without distended neck veins. Respiratory: Lungs are clear to auscultation. Skin:  Without evidence of edema, or rash Trunk: BMI is morbidly elevated and patient  has normal posture.   Neurologic exam : The patient is awake and alert, oriented to place and time.  Memory subjective  described as impaired for word finding .   There is a normal attention span & concentration ability.  Speech is fluent without dysarthria, dysphonia or aphasia.  Mood and affect are depressed, pleating for pain medications. GDS 11 points on 10-15-14.  Cranial nerves:  No change of smell and taste . Pupils are equal reactive to light. - glaucoma suspected in both eyes. .  Extraocular movements  in vertical and horizontal planes intact and without nystagmus. Visual fields by finger perimetry are intact. Hearing to finger rub reduced right over left.  Facial sensation intact to fine touch. Facial motor strength is symmetric and tongue and uvula move midline.  Motor exam:   Pain and crepitation over multiple joints. The patient reports increasing stiffness in both legs .  Sensory:  Fine touch, pinprick and vibration were tested in all extremities. Proprioception is tested in the upper extremities only. This was normal.  Coordination: Rapid  alternating movements in the fingers/hands is tested and normal. Finger-to-nose maneuver tested  without evidence of ataxia, or tremor.  Dysmetria on the left, but patient wears a brace. She uses a walker.  The patient cannot perform a heel-to-shin maneuver .Marland Kitchen  Gait and station: Patient walks with walker for  assistive device. Stance is wider based than  normal. Tandem gait deferred - the patient placed the lateral side of her foot  first  . Astasia/ abasia.  Please note that her  Romberg testing is normal.  Deep tendon reflexes: in the  upper and lower extremities are symmetric and intact. Babinski maneuver response is equivocal..   Assessment:  After physical and neurologic examination, review of laboratory studies, imaging, neurophysiology testing and pre-existing records;  Mrs. Strohschein chief complaint listed as seizures is not what she came to see me for. . 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 She has seen a cardiologist Dr. Lovena Le.  The spell she has described  Included symptoms that the pattern of a brief vertigo , followed by collapse,  not  a sense of doom. sjhe deno ies ever having felt a wave rising from her stomach , or deja vu. no tonic extension, no staring attacks. There has never been a  post ictal component. During her hospitalization in 2008 an EEG was obtained,  which showed left frontal temporal sharp waves.  I would like to repeat this EEG.   In addition her main concern today is PAIN,  Since neurology does not treat joint pain, or back pain,  orthopedic pain or head pain, I will leave this to her primary physician's orthopedic and neurosurgeon.   2)She does have at least 4 or severe migraines a month she describes amongst other type of headaches that seem not to be quite as severe. Interestingly she felt that amitriptyline was able to prevent some of these spells and that she has had an increasing frequency of migraine is the bands since this medication  was longer available to her. She states also that she fell topiramate had not made a difference for her headaches, nor for spells.  Plan:  Treatment plan and additional workup : Mrs. Keeleigh Gillson presented today with a resurgence  of headaches that have been absent for almost 3 years. Over the last 7 months she had a slow increase in return of migraines and she is interested in using the medication she was already familiar with at higher doses to treat the headaches. She described a new onset type of headache which I outlined in my progress note and I identified another type of migrainous headache. Today's visit was 25 minutes with over 50% of our face-to-face time dedicated to the differentiation of her headache disorder, her comorbidities and development since our last visit and refilling and increasing the dose of the medication.  I suggested no follow up in neurology , except for migraine treatment.  I had given her a higher dose of Neurontin at night,  also doubled her amitriptyline dose to Elavil 50 mg at night. It seems that the higher dose of Neurontin and Elavil has reduced her headache significantly and she has had no spells. EEG was normal. She seems to have more stability in her gait after the knee replacement also she has still to use a walker right now. I think she is on the way to becoming much improved. I suggest that Dr. Doug Sou may refill her medication, gabapentin and amitriptyline. Dr. Osie Cheeks is her electrophysiologist-cardiologist.  I answered all her questions today and the patient was in agreement with the above outlined plan.   Tylek Boney, MD [facility].

## 2015-04-15 ENCOUNTER — Telehealth: Payer: Self-pay

## 2015-04-15 NOTE — Telephone Encounter (Signed)
RX for elavil and gabapentin [facility]. Received a receipt of confirmation.

## 2015-04-17 ENCOUNTER — Other Ambulatory Visit: Payer: Self-pay | Admitting: Internal Medicine

## 2015-04-17 ENCOUNTER — Other Ambulatory Visit: Payer: Self-pay | Admitting: Family Medicine

## 2015-04-21 DIAGNOSIS — R300 Dysuria: Secondary | ICD-10-CM | POA: Diagnosis not present

## 2015-04-21 DIAGNOSIS — R3914 Feeling of incomplete bladder emptying: Secondary | ICD-10-CM | POA: Diagnosis not present

## 2015-04-21 DIAGNOSIS — Z0000 Encounter for general adult medical examination without abnormal findings: Secondary | ICD-10-CM | POA: Diagnosis not present

## 2015-05-03 DIAGNOSIS — J45998 Other asthma: Secondary | ICD-10-CM | POA: Diagnosis not present

## 2015-05-05 ENCOUNTER — Ambulatory Visit (INDEPENDENT_AMBULATORY_CARE_PROVIDER_SITE_OTHER): Payer: Medicare Other | Admitting: *Deleted

## 2015-05-05 DIAGNOSIS — R55 Syncope and collapse: Secondary | ICD-10-CM | POA: Diagnosis not present

## 2015-05-05 NOTE — Progress Notes (Signed)
Carelink Summary Report / Loop Recorder 

## 2015-05-07 ENCOUNTER — Encounter: Payer: Self-pay | Admitting: Podiatry

## 2015-05-07 ENCOUNTER — Ambulatory Visit (INDEPENDENT_AMBULATORY_CARE_PROVIDER_SITE_OTHER): Payer: Medicare Other | Admitting: Podiatry

## 2015-05-07 DIAGNOSIS — M79676 Pain in unspecified toe(s): Secondary | ICD-10-CM | POA: Diagnosis not present

## 2015-05-07 DIAGNOSIS — B351 Tinea unguium: Secondary | ICD-10-CM

## 2015-05-07 NOTE — Patient Instructions (Signed)
Diabetes and Foot Care Diabetes may cause you to have problems because of poor blood supply (circulation) to your feet and legs. This may cause the skin on your feet to become thinner, break easier, and heal more slowly. Your skin may become dry, and the skin may peel and crack. You may also have nerve damage in your legs and feet causing decreased feeling in them. You may not notice minor injuries to your feet that could lead to infections or more serious problems. Taking care of your feet is one of the most important things you can do for yourself.  HOME CARE INSTRUCTIONS  Wear shoes at all times, even in the house. Do not go barefoot. Bare feet are easily injured.  Check your feet daily for blisters, cuts, and redness. If you cannot see the bottom of your feet, use a mirror or ask someone for help.  Wash your feet with warm water (do not use hot water) and mild soap. Then pat your feet and the areas between your toes until they are completely dry. Do not soak your feet as this can dry your skin.  Apply a moisturizing lotion or petroleum jelly (that does not contain alcohol and is unscented) to the skin on your feet and to dry, brittle toenails. Do not apply lotion between your toes.  Trim your toenails straight across. Do not dig under them or around the cuticle. File the edges of your nails with an emery board or nail file.  Do not cut corns or calluses or try to remove them with medicine.  Wear clean socks or stockings every day. Make sure they are not too tight. Do not wear knee-high stockings since they may decrease blood flow to your legs.  Wear shoes that fit properly and have enough cushioning. To break in new shoes, wear them for just a few hours a day. This prevents you from injuring your feet. Always look in your shoes before you put them on to be sure there are no objects inside.  Do not cross your legs. This may decrease the blood flow to your feet.  If you find a minor scrape,  cut, or break in the skin on your feet, keep it and the skin around it clean and dry. These areas may be cleansed with mild soap and water. Do not cleanse the area with peroxide, alcohol, or iodine.  When you remove an adhesive bandage, be sure not to damage the skin around it.  If you have a wound, look at it several times a day to make sure it is healing.  Do not use heating pads or hot water bottles. They may burn your skin. If you have lost feeling in your feet or legs, you may not know it is happening until it is too late.  Make sure your health care provider performs a complete foot exam at least annually or more often if you have foot problems. Report any cuts, sores, or bruises to your health care provider immediately. SEEK MEDICAL CARE IF:   You have an injury that is not healing.  You have cuts or breaks in the skin.  You have an ingrown nail.  You notice redness on your legs or feet.  You feel burning or tingling in your legs or feet.  You have pain or cramps in your legs and feet.  Your legs or feet are numb.  Your feet always feel cold. SEEK IMMEDIATE MEDICAL CARE IF:   There is increasing redness,   swelling, or pain in or around a wound.  There is a red line that goes up your leg.  Pus is coming from a wound.  You develop a fever or as directed by your health care provider.  You notice a bad smell coming from an ulcer or wound.   This information is not intended to replace advice given to you by your health care provider. Make sure you discuss any questions you have with your health care provider.   Document Released: 01/02/2000 Document Revised: 09/06/2012 Document Reviewed: 06/13/2012 Elsevier Interactive Patient Education 2016 [employer]vier Inc[employer]  

## 2015-05-08 NOTE — Progress Notes (Signed)
Patient ID: [name], female   DOB: Jan 09, 1946, 70 y.o.   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

## 2015-05-09 ENCOUNTER — Other Ambulatory Visit: Payer: Self-pay | Admitting: Internal Medicine

## 2015-05-09 ENCOUNTER — Other Ambulatory Visit: Payer: Self-pay | Admitting: Family Medicine

## 2015-05-15 ENCOUNTER — Other Ambulatory Visit: Payer: Self-pay | Admitting: Internal Medicine

## 2015-05-19 NOTE — Telephone Encounter (Signed)
Sent to pharmacy 

## 2015-05-20 DIAGNOSIS — M25512 Pain in left shoulder: Secondary | ICD-10-CM | POA: Diagnosis not present

## 2015-06-02 DIAGNOSIS — J45998 Other asthma: Secondary | ICD-10-CM | POA: Diagnosis not present

## 2015-06-03 ENCOUNTER — Ambulatory Visit (INDEPENDENT_AMBULATORY_CARE_PROVIDER_SITE_OTHER): Payer: Medicare Other | Admitting: *Deleted

## 2015-06-03 DIAGNOSIS — R55 Syncope and collapse: Secondary | ICD-10-CM | POA: Diagnosis not present

## 2015-06-04 NOTE — Progress Notes (Signed)
Carelink Summary Report / Loop Recorder 

## 2015-06-12 ENCOUNTER — Other Ambulatory Visit: Payer: Self-pay | Admitting: Internal Medicine

## 2015-06-13 LAB — CUP PACEART REMOTE DEVICE CHECK: Date Time Interrogation Session: 20170318000710

## 2015-06-13 NOTE — Telephone Encounter (Signed)
Sent to pharmacy 

## 2015-06-13 NOTE — Telephone Encounter (Signed)
Was filled on 05/19/15 and is not due plus needs visit for any refills of the reglan.

## 2015-06-16 LAB — CUP PACEART REMOTE DEVICE CHECK: Date Time Interrogation Session: 20170417003701

## 2015-06-16 NOTE — Progress Notes (Signed)
[facility] summary report received. Battery status OK. Normal device function. No new symptom episodes, tachy episodes, brady episodes. No new AF episodes. 6 pause episodes, undersensing. Monthly summary reports and ROV/PRN

## 2015-06-22 ENCOUNTER — Encounter: Payer: Self-pay | Admitting: Nurse Practitioner

## 2015-07-03 ENCOUNTER — Ambulatory Visit (INDEPENDENT_AMBULATORY_CARE_PROVIDER_SITE_OTHER): Payer: Medicare Other | Admitting: *Deleted

## 2015-07-03 DIAGNOSIS — R55 Syncope and collapse: Secondary | ICD-10-CM | POA: Diagnosis not present

## 2015-07-03 DIAGNOSIS — J45998 Other asthma: Secondary | ICD-10-CM | POA: Diagnosis not present

## 2015-07-04 ENCOUNTER — Other Ambulatory Visit: Payer: Self-pay | Admitting: Internal Medicine

## 2015-07-04 NOTE — Progress Notes (Signed)
Carelink Summary Report / Loop Recorder 

## 2015-07-07 ENCOUNTER — Ambulatory Visit (INDEPENDENT_AMBULATORY_CARE_PROVIDER_SITE_OTHER): Payer: Medicare Other | Admitting: Internal Medicine

## 2015-07-07 ENCOUNTER — Encounter: Payer: Self-pay | Admitting: Internal Medicine

## 2015-07-07 ENCOUNTER — Other Ambulatory Visit (INDEPENDENT_AMBULATORY_CARE_PROVIDER_SITE_OTHER): Payer: Medicare Other

## 2015-07-07 VITALS — BP 130/82 | HR 101 | Temp 98.4°F | Resp 18 | Ht 64.5 in | Wt 212.0 lb

## 2015-07-07 DIAGNOSIS — M170 Bilateral primary osteoarthritis of knee: Secondary | ICD-10-CM

## 2015-07-07 DIAGNOSIS — I10 Essential (primary) hypertension: Secondary | ICD-10-CM | POA: Diagnosis not present

## 2015-07-07 DIAGNOSIS — E118 Type 2 diabetes mellitus with unspecified complications: Secondary | ICD-10-CM | POA: Diagnosis not present

## 2015-07-07 DIAGNOSIS — E1142 Type 2 diabetes mellitus with diabetic polyneuropathy: Secondary | ICD-10-CM | POA: Diagnosis not present

## 2015-07-07 LAB — LIPID PANEL
CHOLESTEROL: 145 mg/dL (ref 0–200)
HDL: 46.9 mg/dL (ref >39.00)
NonHDL: 97.71
Total CHOL/HDL Ratio: 3
Triglycerides: 211 mg/dL — ABNORMAL HIGH (ref 0.0–149.0)
VLDL: 42.2 mg/dL — AB (ref 0.0–40.0)

## 2015-07-07 LAB — COMPREHENSIVE METABOLIC PANEL
ALBUMIN: 4.2 g/dL (ref 3.5–5.2)
ALK PHOS: 67 U/L (ref 39–117)
ALT: 8 U/L (ref 0–35)
AST: 10 U/L (ref 0–37)
BUN: 9 mg/dL (ref 6–23)
CO2: 23 mEq/L (ref 19–32)
Calcium: 9.4 mg/dL (ref 8.4–10.5)
Chloride: 107 mEq/L (ref 96–112)
Creatinine, Ser: 0.89 mg/dL (ref 0.40–1.20)
GFR: 81.06 mL/min (ref >60.00)
Glucose, Bld: 121 mg/dL — ABNORMAL HIGH (ref 70–99)
POTASSIUM: 3.7 meq/L (ref 3.5–5.1)
Sodium: 141 mEq/L (ref 135–145)
TOTAL PROTEIN: 7.4 g/dL (ref 6.0–8.3)
Total Bilirubin: 0.2 mg/dL (ref 0.2–1.2)

## 2015-07-07 LAB — MICROALBUMIN / CREATININE URINE RATIO
Creatinine,U: 54.5 mg/dL
MICROALB/CREAT RATIO: 1.3 mg/g (ref 0.0–30.0)
Microalb, Ur: <0.7 mg/dL (ref 0.0–1.9)

## 2015-07-07 LAB — HEMOGLOBIN A1C: Hgb A1c MFr Bld: 5.7 % (ref 4.6–6.5)

## 2015-07-07 LAB — CBC
HCT: 33.8 % — ABNORMAL LOW (ref 36.0–46.0)
Hemoglobin: 10.6 g/dL — ABNORMAL LOW (ref 12.0–15.0)
MCHC: 31.3 g/dL (ref 30.0–36.0)
MCV: 82.7 fl (ref 78.0–100.0)
Platelets: 355 10*3/uL (ref 150.0–400.0)
RBC: 4.09 Mil/uL (ref 3.87–5.11)
RDW: 16.1 % — ABNORMAL HIGH (ref 11.5–15.5)
WBC: 11.5 10*3/uL — ABNORMAL HIGH (ref 4.0–10.5)

## 2015-07-07 LAB — LDL CHOLESTEROL, DIRECT: Direct LDL: 61 mg/dL

## 2015-07-07 NOTE — Progress Notes (Signed)
Pre visit review using our clinic review tool, if applicable. No additional management support is needed unless otherwise documented below in the visit note. 

## 2015-07-07 NOTE — Patient Instructions (Signed)
We will check the labs today and the urine for the diabetes and the cholesterol.   We are not changing the medicines today.   One change that I would like you to try is to not take metoclopramide (also called reglan) with the smallest meal of the day to see if you have any problems with the stomach problems or nausea coming back. If you do then go back to taking 4 times per day. This medicine is one that has some side effects that you are more likely to get the longer you are on the medicine so if you don't need it anymore we would like to decrease the dose or get you off this medicine if possible.

## 2015-07-08 LAB — CUP PACEART REMOTE DEVICE CHECK: Date Time Interrogation Session: 20170517010900

## 2015-07-08 NOTE — Assessment & Plan Note (Signed)
Checking HgA1c and microalbumin to creatinine ratio as well as lipid panel and CMP. Taking saxagliptin and metformin as well as ARB and statin. Adjust as needed. Her neuropathy is stable and does increase her risk of falls.

## 2015-07-08 NOTE — Progress Notes (Signed)
   Subjective:    Patient ID: [name], female    DOB: 1945/12/31, 70 y.o.   MRN: [id number]  HPI The patient is a 70 YO female coming in for follow up of her medical conditions including her diabetes (no low sugars, taking saxaliptin/metformin daily, on losartan, complicated by neuropathy which is stable), and her blood pressure (taking losartan and diltiazem and imdur, not complicated), and her arthritis (still having a lot of pain in her knees and hips, has not been seeing her orthopedic doctor much, had knee replacement and it is still hurting although her orthopedic states it should be better by now, she is having some more function in it but still using walker to ambulate, some with the cane in her house, denies falls as she is being really careful).   Review of Systems  Constitutional: Positive for activity change. Negative for fever, appetite change, fatigue and unexpected weight change.  HENT: Negative.   Respiratory: Negative for cough, chest tightness, shortness of breath and wheezing.   Cardiovascular: Negative for chest pain, palpitations and leg swelling.  Gastrointestinal: Negative for nausea, diarrhea, constipation, blood in stool and abdominal distention.  Musculoskeletal: Positive for back pain, arthralgias and gait problem.  Skin: Negative.   Neurological: Positive for numbness. Negative for dizziness, weakness, light-headedness and headaches.  Psychiatric/Behavioral: Positive for sleep disturbance. Negative for decreased concentration and agitation. The patient is not nervous/anxious.       Objective:   Physical Exam  Constitutional: She is oriented to person, place, and time. She appears well-developed and well-nourished.  HENT:  Head: Normocephalic and atraumatic.  Eyes: EOM are normal.  Neck: Normal range of motion.  Cardiovascular: Normal rate and regular rhythm.   Pulmonary/Chest: Effort normal. No respiratory distress. She has no wheezes. She has no rales.    Abdominal: Soft. Bowel sounds are normal. She exhibits no distension. There is no tenderness.  Musculoskeletal: She exhibits tenderness. She exhibits no edema.  Tenderness in her knees  Neurological: She is alert and oriented to person, place, and time.  Slow to stand, steady with walker  Skin: Skin is warm and dry.  Psychiatric: She has a normal mood and affect.   Filed Vitals:   07/07/15 1438  BP: 130/82  Pulse: 101  Temp: 98.4 F (36.9 C)  TempSrc: Oral  Resp: 18  Height: 5' 4.5" (1.638 m)  Weight: 212 lb (96.163 kg)  SpO2: 97%      Assessment & Plan:

## 2015-07-08 NOTE — Assessment & Plan Note (Signed)
BP at goal on imdur, losartan, diltiazem. No side effects. Checking CMP and adjust as needed.

## 2015-07-08 NOTE — Assessment & Plan Note (Signed)
S/P left knee replacement and is still having pain and loss of function. She is encouraged to continue with her therap and continue seeing orthopedics. She is off narcotics for pain which is good. She is still using tylenol otc. Reminded about limit of 3G per day.

## 2015-07-09 ENCOUNTER — Encounter: Payer: Self-pay | Admitting: Internal Medicine

## 2015-07-10 ENCOUNTER — Other Ambulatory Visit: Payer: Self-pay | Admitting: Internal Medicine

## 2015-07-11 NOTE — Telephone Encounter (Signed)
Faxed to pharmacy

## 2015-07-15 DIAGNOSIS — Z23 Encounter for immunization: Secondary | ICD-10-CM | POA: Diagnosis not present

## 2015-07-25 ENCOUNTER — Telehealth: Payer: Self-pay | Admitting: Cardiology

## 2015-07-25 NOTE — Telephone Encounter (Signed)
[facility] requesting that pt send manual transmission b/c home monitor has not updated in at least 14 days.    

## 2015-07-29 ENCOUNTER — Telehealth: Payer: Self-pay | Admitting: *Deleted

## 2015-07-29 MED ORDER — ONETOUCH ULTRASOFT LANCETS MISC: 1.0000 | Freq: Two times a day (BID) | Status: DC

## 2015-07-29 MED ORDER — ALCOHOL PADS 70 % PADS: MEDICATED_PAD | Status: AC

## 2015-07-29 MED ORDER — GLUCOSE BLOOD VI STRP: 1.0000 | ORAL_STRIP | Freq: Two times a day (BID) | Status: DC

## 2015-07-29 NOTE — Telephone Encounter (Signed)
Pt left msg on triage needing to get rx on her testing strips. Called pt back to verify name of strips & where she need rx to go. Pt needing One touch ultra since insurance change will have to use [facility] to receive supplies. Inform pt will send to [employer]...Johny Chess

## 2015-08-01 LAB — CUP PACEART REMOTE DEVICE CHECK: Date Time Interrogation Session: 20170616013652

## 2015-08-02 DIAGNOSIS — J45998 Other asthma: Secondary | ICD-10-CM | POA: Diagnosis not present

## 2015-08-04 ENCOUNTER — Ambulatory Visit (INDEPENDENT_AMBULATORY_CARE_PROVIDER_SITE_OTHER): Payer: Medicare Other | Admitting: *Deleted

## 2015-08-04 DIAGNOSIS — R55 Syncope and collapse: Secondary | ICD-10-CM

## 2015-08-04 NOTE — Progress Notes (Signed)
Carelink Summary Report / Loop Recorder 

## 2015-08-08 ENCOUNTER — Other Ambulatory Visit: Payer: Self-pay | Admitting: Internal Medicine

## 2015-08-13 ENCOUNTER — Encounter: Payer: Self-pay | Admitting: Podiatry

## 2015-08-13 ENCOUNTER — Ambulatory Visit (INDEPENDENT_AMBULATORY_CARE_PROVIDER_SITE_OTHER): Payer: Medicare Other | Admitting: Podiatry

## 2015-08-13 DIAGNOSIS — B351 Tinea unguium: Secondary | ICD-10-CM

## 2015-08-13 DIAGNOSIS — M79676 Pain in unspecified toe(s): Secondary | ICD-10-CM | POA: Diagnosis not present

## 2015-08-13 NOTE — Patient Instructions (Signed)
Diabetes and Foot Care Diabetes may cause you to have problems because of poor blood supply (circulation) to your feet and legs. This may cause the skin on your feet to become thinner, break easier, and heal more slowly. Your skin may become dry, and the skin may peel and crack. You may also have nerve damage in your legs and feet causing decreased feeling in them. You may not notice minor injuries to your feet that could lead to infections or more serious problems. Taking care of your feet is one of the most important things you can do for yourself.  HOME CARE INSTRUCTIONS  Wear shoes at all times, even in the house. Do not go barefoot. Bare feet are easily injured.  Check your feet daily for blisters, cuts, and redness. If you cannot see the bottom of your feet, use a mirror or ask someone for help.  Wash your feet with warm water (do not use hot water) and mild soap. Then pat your feet and the areas between your toes until they are completely dry. Do not soak your feet as this can dry your skin.  Apply a moisturizing lotion or petroleum jelly (that does not contain alcohol and is unscented) to the skin on your feet and to dry, brittle toenails. Do not apply lotion between your toes.  Trim your toenails straight across. Do not dig under them or around the cuticle. File the edges of your nails with an emery board or nail file.  Do not cut corns or calluses or try to remove them with medicine.  Wear clean socks or stockings every day. Make sure they are not too tight. Do not wear knee-high stockings since they may decrease blood flow to your legs.  Wear shoes that fit properly and have enough cushioning. To break in new shoes, wear them for just a few hours a day. This prevents you from injuring your feet. Always look in your shoes before you put them on to be sure there are no objects inside.  Do not cross your legs. This may decrease the blood flow to your feet.  If you find a minor scrape,  cut, or break in the skin on your feet, keep it and the skin around it clean and dry. These areas may be cleansed with mild soap and water. Do not cleanse the area with peroxide, alcohol, or iodine.  When you remove an adhesive bandage, be sure not to damage the skin around it.  If you have a wound, look at it several times a day to make sure it is healing.  Do not use heating pads or hot water bottles. They may burn your skin. If you have lost feeling in your feet or legs, you may not know it is happening until it is too late.  Make sure your health care provider performs a complete foot exam at least annually or more often if you have foot problems. Report any cuts, sores, or bruises to your health care provider immediately. SEEK MEDICAL CARE IF:   You have an injury that is not healing.  You have cuts or breaks in the skin.  You have an ingrown nail.  You notice redness on your legs or feet.  You feel burning or tingling in your legs or feet.  You have pain or cramps in your legs and feet.  Your legs or feet are numb.  Your feet always feel cold. SEEK IMMEDIATE MEDICAL CARE IF:   There is increasing redness,   swelling, or pain in or around a wound.  There is a red line that goes up your leg.  Pus is coming from a wound.  You develop a fever or as directed by your health care provider.  You notice a bad smell coming from an ulcer or wound.   This information is not intended to replace advice given to you by your health care provider. Make sure you discuss any questions you have with your health care provider.   Document Released: 01/02/2000 Document Revised: 09/06/2012 Document Reviewed: 06/13/2012 Elsevier Interactive Patient Education 2016 [employer]vier Inc[employer]  

## 2015-08-14 ENCOUNTER — Ambulatory Visit (INDEPENDENT_AMBULATORY_CARE_PROVIDER_SITE_OTHER): Payer: Medicare Other

## 2015-08-14 VITALS — BP 146/80 | Ht 65.0 in | Wt 212.2 lb

## 2015-08-14 DIAGNOSIS — Z0000 Encounter for general adult medical examination without abnormal findings: Secondary | ICD-10-CM | POA: Diagnosis not present

## 2015-08-14 NOTE — Progress Notes (Signed)
Patient ID: [name], female   DOB: 07-02-1945, 70 y.o.   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

## 2015-08-14 NOTE — Patient Instructions (Addendum)
[name] , Thank you for taking time to come for your Medicare Wellness Visit. I appreciate your ongoing commitment to your health goals. Please review the following plan we discussed and let me know if I can assist you in the future.   Deaf & Hard of Hearing Division Services - for free hearing aid No reviews  [facility] [phone]  [phone]  [place] offers free advance directive forms, as well as assistance in completing the forms themselves. For assistance, contact the Spiritual Care Department at [phone], or the Clinical Social Work Department at [phone].   These are the goals we discussed: Goals    . Weight (lb) < 160 lb (72.6 kg)          Watches what I eat Continue to eat salads and drinking water Limited sweets;  Keep it up!       This is a list of the screening recommended for you and due dates:  Health Maintenance  Topic Date Due  .  Hepatitis C: One time screening is recommended by Center for Disease Control  (CDC) for  adults born from [birth year] through 1963.   1945-06-08  . DEXA scan (bone density measurement)  04/15/2012  . Pneumonia vaccines (1 of 2 - PCV13) 04/15/2012  . Eye exam for diabetics  08/02/2015  . Complete foot exam   08/13/2015  . Flu Shot  12/24/2015*  . Shingles Vaccine  07/06/2016*  . Hemoglobin A1C  01/06/2016  . Mammogram  02/20/2017  . Colon Cancer Screening  09/09/2024  . Tetanus Vaccine  12/24/2024  *Topic was postponed. The date shown is not the original due date.       Fall Prevention in the Home  Falls can cause injuries. They can happen to people of all ages. There are many things you can do to make your home safe and to help prevent falls.  WHAT CAN I DO ON THE OUTSIDE OF MY HOME?  Regularly fix the edges of walkways and driveways and fix any cracks.  Remove anything that might make you trip as you walk through a door, such as a raised step or threshold.  Trim any bushes or trees on the  path to your home.  Use bright outdoor lighting.  Clear any walking paths of anything that might make someone trip, such as rocks or tools.  Regularly check to see if handrails are loose or broken. Make sure that both sides of any steps have handrails.  Any raised decks and porches should have guardrails on the edges.  Have any leaves, snow, or ice cleared regularly.  Use sand or salt on walking paths during winter.  Clean up any spills in your garage right away. This includes oil or grease spills. WHAT CAN I DO IN THE BATHROOM?   Use night lights.  Install grab bars by the toilet and in the tub and shower. Do not use towel bars as grab bars.  Use non-skid mats or decals in the tub or shower.  If you need to sit down in the shower, use a plastic, non-slip stool.  Keep the floor dry. Clean up any water that spills on the floor as soon as it happens.  Remove soap buildup in the tub or shower regularly.  Attach bath mats securely with double-sided non-slip rug tape.  Do not have throw rugs and other things on the floor that can make you trip. WHAT CAN I DO IN THE  BEDROOM?  Use night lights.  Make sure that you have a light by your bed that is easy to reach.  Do not use any sheets or blankets that are too big for your bed. They should not hang down onto the floor.  Have a firm chair that has side arms. You can use this for support while you get dressed.  Do not have throw rugs and other things on the floor that can make you trip. WHAT CAN I DO IN THE KITCHEN?  Clean up any spills right away.  Avoid walking on wet floors.  Keep items that you use a lot in easy-to-reach places.  If you need to reach something above you, use a strong step stool that has a grab bar.  Keep electrical cords out of the way.  Do not use floor polish or wax that makes floors slippery. If you must use wax, use non-skid floor wax.  Do not have throw rugs and other things on the floor that  can make you trip. WHAT CAN I DO WITH MY STAIRS?  Do not leave any items on the stairs.  Make sure that there are handrails on both sides of the stairs and use them. Fix handrails that are broken or loose. Make sure that handrails are as long as the stairways.  Check any carpeting to make sure that it is firmly attached to the stairs. Fix any carpet that is loose or worn.  Avoid having throw rugs at the top or bottom of the stairs. If you do have throw rugs, attach them to the floor with carpet tape.  Make sure that you have a light switch at the top of the stairs and the bottom of the stairs. If you do not have them, ask someone to add them for you. WHAT ELSE CAN I DO TO HELP PREVENT FALLS?  Wear shoes that:  Do not have high heels.  Have rubber bottoms.  Are comfortable and fit you well.  Are closed at the toe. Do not wear sandals.  If you use a stepladder:  Make sure that it is fully opened. Do not climb a closed stepladder.  Make sure that both sides of the stepladder are locked into place.  Ask someone to hold it for you, if possible.  Clearly mark and make sure that you can see:  Any grab bars or handrails.  First and last steps.  Where the edge of each step is.  Use tools that help you move around (mobility aids) if they are needed. These include:  Canes.  Walkers.  Scooters.  Crutches.  Turn on the lights when you go into a dark area. Replace any light bulbs as soon as they burn out.  Set up your furniture so you have a clear path. Avoid moving your furniture around.  If any of your floors are uneven, fix them.  If there are any pets around you, be aware of where they are.  Review your medicines with your doctor. Some medicines can make you feel dizzy. This can increase your chance of falling. Ask your doctor what other things that you can do to help prevent falls.   This information is not intended to replace advice given to you by your health  care provider. Make sure you discuss any questions you have with your health care provider.   Document Released: 10/31/2008 Document Revised: 05/21/2014 Document Reviewed: 02/08/2014 Elsevier Interactive Patient Education 2016 [place] Maintenance, Female Adopting a healthy  lifestyle and getting preventive care can go a long way to promote health and wellness. Talk with your health care provider about what schedule of regular examinations is right for you. This is a good chance for you to check in with your provider about disease prevention and staying healthy. In between checkups, there are plenty of things you can do on your own. Experts have done a lot of research about which lifestyle changes and preventive measures are most likely to keep you healthy. Ask your health care provider for more information. WEIGHT AND DIET  Eat a healthy diet  Be sure to include plenty of vegetables, fruits, low-fat dairy products, and lean protein.  Do not eat a lot of foods high in solid fats, added sugars, or salt.  Get regular exercise. This is one of the most important things you can do for your health.  Most adults should exercise for at least 150 minutes each week. The exercise should increase your heart rate and make you sweat (moderate-intensity exercise).  Most adults should also do strengthening exercises at least twice a week. This is in addition to the moderate-intensity exercise.  Maintain a healthy weight  Body mass index (BMI) is a measurement that can be used to identify possible weight problems. It estimates body fat based on height and weight. Your health care provider can help determine your BMI and help you achieve or maintain a healthy weight.  For females 16 years of age and older:   A BMI below 18.5 is considered underweight.  A BMI of 18.5 to 24.9 is normal.  A BMI of 25 to 29.9 is considered overweight.  A BMI of 30 and above is considered obese.  Watch  levels of cholesterol and blood lipids  You should start having your blood tested for lipids and cholesterol at 68 years of age, then have this test every 5 years.  You may need to have your cholesterol levels checked more often if:  Your lipid or cholesterol levels are high.  You are older than 68 years of age.  You are at high risk for heart disease.  CANCER SCREENING   Lung Cancer  Lung cancer screening is recommended for adults 37-70 years old who are at high risk for lung cancer because of a history of smoking.  A yearly low-dose CT scan of the lungs is recommended for people who:  Currently smoke.  Have quit within the past 15 years.  Have at least a 30-pack-year history of smoking. A pack year is smoking an average of one pack of cigarettes a day for 1 year.  Yearly screening should continue until it has been 15 years since you quit.  Yearly screening should stop if you develop a health problem that would prevent you from having lung cancer treatment.  Breast Cancer  Practice breast self-awareness. This means understanding how your breasts normally appear and feel.  It also means doing regular breast self-exams. Let your health care provider know about any changes, no matter how small.  If you are in your 20s or 30s, you should have a clinical breast exam (CBE) by a health care provider every 1-3 years as part of a regular health exam.  If you are 70 or older, have a CBE every year. Also consider having a breast X-ray (mammogram) every year.  If you have a family history of breast cancer, talk to your health care provider about genetic screening.  If you are at high risk for breast  cancer, talk to your health care provider about having an MRI and a mammogram every year.  Breast cancer gene (BRCA) assessment is recommended for women who have family members with BRCA-related cancers. BRCA-related cancers include:  Breast.  Ovarian.  Tubal.  Peritoneal  cancers.  Results of the assessment will determine the need for genetic counseling and BRCA1 and BRCA2 testing. Cervical Cancer Your health care provider may recommend that you be screened regularly for cancer of the pelvic organs (ovaries, uterus, and vagina). This screening involves a pelvic examination, including checking for microscopic changes to the surface of your cervix (Pap test). You may be encouraged to have this screening done every 3 years, beginning at age 90.  For women ages 55-65, health care providers may recommend pelvic exams and Pap testing every 3 years, or they may recommend the Pap and pelvic exam, combined with testing for human papilloma virus (HPV), every 5 years. Some types of HPV increase your risk of cervical cancer. Testing for HPV may also be done on women of any age with unclear Pap test results.  Other health care providers may not recommend any screening for nonpregnant women who are considered low risk for pelvic cancer and who do not have symptoms. Ask your health care provider if a screening pelvic exam is right for you.  If you have had past treatment for cervical cancer or a condition that could lead to cancer, you need Pap tests and screening for cancer for at least 20 years after your treatment. If Pap tests have been discontinued, your risk factors (such as having a new sexual partner) need to be reassessed to determine if screening should resume. Some women have medical problems that increase the chance of getting cervical cancer. In these cases, your health care provider may recommend more frequent screening and Pap tests. Colorectal Cancer  This type of cancer can be detected and often prevented.  Routine colorectal cancer screening usually begins at 68 years of age and continues through 68 years of age.  Your health care provider may recommend screening at an earlier age if you have risk factors for colon cancer.  Your health care provider may also  recommend using home test kits to check for hidden blood in the stool.  A small camera at the end of a tube can be used to examine your colon directly (sigmoidoscopy or colonoscopy). This is done to check for the earliest forms of colorectal cancer.  Routine screening usually begins at age 38.  Direct examination of the colon should be repeated every 5-10 years through 68 years of age. However, you may need to be screened more often if early forms of precancerous polyps or small growths are found. Skin Cancer  Check your skin from head to toe regularly.  Tell your health care provider about any new moles or changes in moles, especially if there is a change in a mole's shape or color.  Also tell your health care provider if you have a mole that is larger than the size of a pencil eraser.  Always use sunscreen. Apply sunscreen liberally and repeatedly throughout the day.  Protect yourself by wearing long sleeves, pants, a wide-brimmed hat, and sunglasses whenever you are outside. HEART DISEASE, DIABETES, AND HIGH BLOOD PRESSURE   High blood pressure causes heart disease and increases the risk of stroke. High blood pressure is more likely to develop in:  People who have blood pressure in the high end of the normal range (130-139/85-89 mm  Hg).  People who are overweight or obese.  People who are African American.  If you are 17-24 years of age, have your blood pressure checked every 3-5 years. If you are 24 years of age or older, have your blood pressure checked every year. You should have your blood pressure measured twice--once when you are at a hospital or clinic, and once when you are not at a hospital or clinic. Record the average of the two measurements. To check your blood pressure when you are not at a hospital or clinic, you can use:  An automated blood pressure machine at a pharmacy.  A home blood pressure monitor.  If you are between 2 years and 79 years old, ask your health  care provider if you should take aspirin to prevent strokes.  Have regular diabetes screenings. This involves taking a blood sample to check your fasting blood sugar level.  If you are at a normal weight and have a low risk for diabetes, have this test once every three years after 68 years of age.  If you are overweight and have a high risk for diabetes, consider being tested at a younger age or more often. PREVENTING INFECTION  Hepatitis B  If you have a higher risk for hepatitis B, you should be screened for this virus. You are considered at high risk for hepatitis B if:  You were born in a country where hepatitis B is common. Ask your health care provider which countries are considered high risk.  Your parents were born in a high-risk country, and you have not been immunized against hepatitis B (hepatitis B vaccine).  You have HIV or AIDS.  You use needles to inject street drugs.  You live with someone who has hepatitis B.  You have had sex with someone who has hepatitis B.  You get hemodialysis treatment.  You take certain medicines for conditions, including cancer, organ transplantation, and autoimmune conditions. Hepatitis C  Blood testing is recommended for:  Everyone born from [birth year] through 1963.  Anyone with known risk factors for hepatitis C. Sexually transmitted infections (STIs)  You should be screened for sexually transmitted infections (STIs) including gonorrhea and chlamydia if:  You are sexually active and are younger than 68 years of age.  You are older than 68 years of age and your health care provider tells you that you are at risk for this type of infection.  Your sexual activity has changed since you were last screened and you are at an increased risk for chlamydia or gonorrhea. Ask your health care provider if you are at risk.  If you do not have HIV, but are at risk, it may be recommended that you take a prescription medicine daily to prevent HIV  infection. This is called pre-exposure prophylaxis (PrEP). You are considered at risk if:  You are sexually active and do not regularly use condoms or know the HIV status of your partner(s).  You take drugs by injection.  You are sexually active with a partner who has HIV. Talk with your health care provider about whether you are at high risk of being infected with HIV. If you choose to begin PrEP, you should first be tested for HIV. You should then be tested every 3 months for as long as you are taking PrEP.  PREGNANCY   If you are premenopausal and you may become pregnant, ask your health care provider about preconception counseling.  If you may become pregnant, take 400 to 800  micrograms (mcg) of folic acid every day.  If you want to prevent pregnancy, talk to your health care provider about birth control (contraception). OSTEOPOROSIS AND MENOPAUSE   Osteoporosis is a disease in which the bones lose minerals and strength with aging. This can result in serious bone fractures. Your risk for osteoporosis can be identified using a bone density scan.  If you are 47 years of age or older, or if you are at risk for osteoporosis and fractures, ask your health care provider if you should be screened.  Ask your health care provider whether you should take a calcium or vitamin D supplement to lower your risk for osteoporosis.  Menopause may have certain physical symptoms and risks.  Hormone replacement therapy may reduce some of these symptoms and risks. Talk to your health care provider about whether hormone replacement therapy is right for you.  HOME CARE INSTRUCTIONS   Schedule regular health, dental, and eye exams.  Stay current with your immunizations.   Do not use any tobacco products including cigarettes, chewing tobacco, or electronic cigarettes.  If you are pregnant, do not drink alcohol.  If you are breastfeeding, limit how much and how often you drink alcohol.  Limit  alcohol intake to no more than 1 drink per day for nonpregnant women. One drink equals 12 ounces of beer, 5 ounces of wine, or 1 ounces of hard liquor.  Do not use street drugs.  Do not share needles.  Ask your health care provider for help if you need support or information about quitting drugs.  Tell your health care provider if you often feel depressed.  Tell your health care provider if you have ever been abused or do not feel safe at home.   This information is not intended to replace advice given to you by your health care provider. Make sure you discuss any questions you have with your health care provider.   Document Released: 07/20/2010 Document Revised: 01/25/2014 Document Reviewed: 12/06/2012 Elsevier Interactive Patient Education 2016 [employer].   Hearing Loss Hearing loss is a partial or total loss of the ability to hear. This can be temporary or permanent, and it can happen in one or both ears. Hearing loss may be referred to as deafness. Medical care is necessary to treat hearing loss properly and to prevent the condition from getting worse. Your hearing may partially or completely come back, depending on what caused your hearing loss and how severe it is. In some cases, hearing loss is permanent. CAUSES Common causes of hearing loss include:   Too much wax in the ear canal.   Infection of the ear canal or middle ear.   Fluid in the middle ear.   Injury to the ear or surrounding area.   An object stuck in the ear.   Prolonged exposure to loud sounds, such as music.  Less common causes of hearing loss include:   Tumors in the ear.   Viral or bacterial infections, such as meningitis.   A hole in the eardrum (perforated eardrum).  Problems with the hearing nerve that sends signals between the brain and the ear.  Certain medicines.  SYMPTOMS  Symptoms of this condition may include:  Difficulty telling the difference between  sounds.  Difficulty following a conversation when there is background noise.  Lack of response to sounds in your environment. This may be most noticeable when you do not respond to startling sounds.  Needing to turn up the volume on the television,  radio, etc.  Ringing in the ears.  Dizziness.  Pain in the ears. DIAGNOSIS This condition is diagnosed based on a physical exam and a hearing test (audiometry). The audiometry test will be performed by a hearing specialist (audiologist). You may also be referred to an ear, nose, and throat (ENT) specialist (otolaryngologist).  TREATMENT Treatment for recent onset of hearing loss may include:   Ear wax removal.   Being prescribed medicines to prevent infection (antibiotics).   Being prescribed medicines to reduce inflammation (corticosteroids).  HOME CARE INSTRUCTIONS  If you were prescribed an antibiotic medicine, take it as told by your health care provider. Do not stop taking the antibiotic even if you start to feel better.  Take over-the-counter and prescription medicines only as told by your health care provider.  Avoid loud noises.   Return to your normal activities as told by your health care provider. Ask your health care provider what activities are safe for you.  Keep all follow-up visits as told by your health care provider. This is important. SEEK MEDICAL CARE IF:   You feel dizzy.   You develop new symptoms.   You vomit or feel nauseous.   You have a fever.  SEEK IMMEDIATE MEDICAL CARE IF:  You develop sudden changes in your vision.   You have severe ear pain.   You have new or increased weakness.  You have a severe headache.   This information is not intended to replace advice given to you by your health care provider. Make sure you discuss any questions you have with your health care provider.   Document Released: 01/04/2005 Document Revised: 09/25/2014 Document Reviewed: 05/22/2014 Elsevier  Interactive Patient Education 2016 [place] in the Home  Falls can cause injuries. They can happen to people of all ages. There are many things you can do to make your home safe and to help prevent falls.  WHAT CAN I DO ON THE OUTSIDE OF MY HOME?  Regularly fix the edges of walkways and driveways and fix any cracks.  Remove anything that might make you trip as you walk through a door, such as a raised step or threshold.  Trim any bushes or trees on the path to your home.  Use bright outdoor lighting.  Clear any walking paths of anything that might make someone trip, such as rocks or tools.  Regularly check to see if handrails are loose or broken. Make sure that both sides of any steps have handrails.  Any raised decks and porches should have guardrails on the edges.  Have any leaves, snow, or ice cleared regularly.  Use sand or salt on walking paths during winter.  Clean up any spills in your garage right away. This includes oil or grease spills. WHAT CAN I DO IN THE BATHROOM?   Use night lights.  Install grab bars by the toilet and in the tub and shower. Do not use towel bars as grab bars.  Use non-skid mats or decals in the tub or shower.  If you need to sit down in the shower, use a plastic, non-slip stool.  Keep the floor dry. Clean up any water that spills on the floor as soon as it happens.  Remove soap buildup in the tub or shower regularly.  Attach bath mats securely with double-sided non-slip rug tape.  Do not have throw rugs and other things on the floor that can make you trip. WHAT CAN I DO IN THE  BEDROOM?  Use night lights.  Make sure that you have a light by your bed that is easy to reach.  Do not use any sheets or blankets that are too big for your bed. They should not hang down onto the floor.  Have a firm chair that has side arms. You can use this for support while you get dressed.  Do not have throw rugs and other  things on the floor that can make you trip. WHAT CAN I DO IN THE KITCHEN?  Clean up any spills right away.  Avoid walking on wet floors.  Keep items that you use a lot in easy-to-reach places.  If you need to reach something above you, use a strong step stool that has a grab bar.  Keep electrical cords out of the way.  Do not use floor polish or wax that makes floors slippery. If you must use wax, use non-skid floor wax.  Do not have throw rugs and other things on the floor that can make you trip. WHAT CAN I DO WITH MY STAIRS?  Do not leave any items on the stairs.  Make sure that there are handrails on both sides of the stairs and use them. Fix handrails that are broken or loose. Make sure that handrails are as long as the stairways.  Check any carpeting to make sure that it is firmly attached to the stairs. Fix any carpet that is loose or worn.  Avoid having throw rugs at the top or bottom of the stairs. If you do have throw rugs, attach them to the floor with carpet tape.  Make sure that you have a light switch at the top of the stairs and the bottom of the stairs. If you do not have them, ask someone to add them for you. WHAT ELSE CAN I DO TO HELP PREVENT FALLS?  Wear shoes that:  Do not have high heels.  Have rubber bottoms.  Are comfortable and fit you well.  Are closed at the toe. Do not wear sandals.  If you use a stepladder:  Make sure that it is fully opened. Do not climb a closed stepladder.  Make sure that both sides of the stepladder are locked into place.  Ask someone to hold it for you, if possible.  Clearly mark and make sure that you can see:  Any grab bars or handrails.  First and last steps.  Where the edge of each step is.  Use tools that help you move around (mobility aids) if they are needed. These include:  Canes.  Walkers.  Scooters.  Crutches.  Turn on the lights when you go into a dark area. Replace any light bulbs as soon as  they burn out.  Set up your furniture so you have a clear path. Avoid moving your furniture around.  If any of your floors are uneven, fix them.  If there are any pets around you, be aware of where they are.  Review your medicines with your doctor. Some medicines can make you feel dizzy. This can increase your chance of falling. Ask your doctor what other things that you can do to help prevent falls.   This information is not intended to replace advice given to you by your health care provider. Make sure you discuss any questions you have with your health care provider.   Document Released: 10/31/2008 Document Revised: 05/21/2014 Document Reviewed: 02/08/2014 Elsevier Interactive Patient Education 2016 [place] Maintenance, Female Adopting a healthy  lifestyle and getting preventive care can go a long way to promote health and wellness. Talk with your health care provider about what schedule of regular examinations is right for you. This is a good chance for you to check in with your provider about disease prevention and staying healthy. In between checkups, there are plenty of things you can do on your own. Experts have done a lot of research about which lifestyle changes and preventive measures are most likely to keep you healthy. Ask your health care provider for more information. WEIGHT AND DIET  Eat a healthy diet  Be sure to include plenty of vegetables, fruits, low-fat dairy products, and lean protein.  Do not eat a lot of foods high in solid fats, added sugars, or salt.  Get regular exercise. This is one of the most important things you can do for your health.  Most adults should exercise for at least 150 minutes each week. The exercise should increase your heart rate and make you sweat (moderate-intensity exercise).  Most adults should also do strengthening exercises at least twice a week. This is in addition to the moderate-intensity exercise.  Maintain a  healthy weight  Body mass index (BMI) is a measurement that can be used to identify possible weight problems. It estimates body fat based on height and weight. Your health care provider can help determine your BMI and help you achieve or maintain a healthy weight.  For females 72 years of age and older:   A BMI below 18.5 is considered underweight.  A BMI of 18.5 to 24.9 is normal.  A BMI of 25 to 29.9 is considered overweight.  A BMI of 30 and above is considered obese.  Watch levels of cholesterol and blood lipids  You should start having your blood tested for lipids and cholesterol at 68 years of age, then have this test every 5 years.  You may need to have your cholesterol levels checked more often if:  Your lipid or cholesterol levels are high.  You are older than 68 years of age.  You are at high risk for heart disease.  CANCER SCREENING   Lung Cancer  Lung cancer screening is recommended for adults 43-31 years old who are at high risk for lung cancer because of a history of smoking.  A yearly low-dose CT scan of the lungs is recommended for people who:  Currently smoke.  Have quit within the past 15 years.  Have at least a 30-pack-year history of smoking. A pack year is smoking an average of one pack of cigarettes a day for 1 year.  Yearly screening should continue until it has been 15 years since you quit.  Yearly screening should stop if you develop a health problem that would prevent you from having lung cancer treatment.  Breast Cancer  Practice breast self-awareness. This means understanding how your breasts normally appear and feel.  It also means doing regular breast self-exams. Let your health care provider know about any changes, no matter how small.  If you are in your 20s or 30s, you should have a clinical breast exam (CBE) by a health care provider every 1-3 years as part of a regular health exam.  If you are 17 or older, have a CBE every year.  Also consider having a breast X-ray (mammogram) every year.  If you have a family history of breast cancer, talk to your health care provider about genetic screening.  If you are at high risk for  breast cancer, talk to your health care provider about having an MRI and a mammogram every year.  Breast cancer gene (BRCA) assessment is recommended for women who have family members with BRCA-related cancers. BRCA-related cancers include:  Breast.  Ovarian.  Tubal.  Peritoneal cancers.  Results of the assessment will determine the need for genetic counseling and BRCA1 and BRCA2 testing. Cervical Cancer Your health care provider may recommend that you be screened regularly for cancer of the pelvic organs (ovaries, uterus, and vagina). This screening involves a pelvic examination, including checking for microscopic changes to the surface of your cervix (Pap test). You may be encouraged to have this screening done every 3 years, beginning at age 78.  For women ages 65-65, health care providers may recommend pelvic exams and Pap testing every 3 years, or they may recommend the Pap and pelvic exam, combined with testing for human papilloma virus (HPV), every 5 years. Some types of HPV increase your risk of cervical cancer. Testing for HPV may also be done on women of any age with unclear Pap test results.  Other health care providers may not recommend any screening for nonpregnant women who are considered low risk for pelvic cancer and who do not have symptoms. Ask your health care provider if a screening pelvic exam is right for you.  If you have had past treatment for cervical cancer or a condition that could lead to cancer, you need Pap tests and screening for cancer for at least 20 years after your treatment. If Pap tests have been discontinued, your risk factors (such as having a new sexual partner) need to be reassessed to determine if screening should resume. Some women have medical problems that  increase the chance of getting cervical cancer. In these cases, your health care provider may recommend more frequent screening and Pap tests. Colorectal Cancer  This type of cancer can be detected and often prevented.  Routine colorectal cancer screening usually begins at 68 years of age and continues through 68 years of age.  Your health care provider may recommend screening at an earlier age if you have risk factors for colon cancer.  Your health care provider may also recommend using home test kits to check for hidden blood in the stool.  A small camera at the end of a tube can be used to examine your colon directly (sigmoidoscopy or colonoscopy). This is done to check for the earliest forms of colorectal cancer.  Routine screening usually begins at age 56.  Direct examination of the colon should be repeated every 5-10 years through 68 years of age. However, you may need to be screened more often if early forms of precancerous polyps or small growths are found. Skin Cancer  Check your skin from head to toe regularly.  Tell your health care provider about any new moles or changes in moles, especially if there is a change in a mole's shape or color.  Also tell your health care provider if you have a mole that is larger than the size of a pencil eraser.  Always use sunscreen. Apply sunscreen liberally and repeatedly throughout the day.  Protect yourself by wearing long sleeves, pants, a wide-brimmed hat, and sunglasses whenever you are outside. HEART DISEASE, DIABETES, AND HIGH BLOOD PRESSURE   High blood pressure causes heart disease and increases the risk of stroke. High blood pressure is more likely to develop in:  People who have blood pressure in the high end of the normal range (130-139/85-89 mm  Hg).  People who are overweight or obese.  People who are African American.  If you are 74-3 years of age, have your blood pressure checked every 3-5 years. If you are 80 years of  age or older, have your blood pressure checked every year. You should have your blood pressure measured twice--once when you are at a hospital or clinic, and once when you are not at a hospital or clinic. Record the average of the two measurements. To check your blood pressure when you are not at a hospital or clinic, you can use:  An automated blood pressure machine at a pharmacy.  A home blood pressure monitor.  If you are between 17 years and 78 years old, ask your health care provider if you should take aspirin to prevent strokes.  Have regular diabetes screenings. This involves taking a blood sample to check your fasting blood sugar level.  If you are at a normal weight and have a low risk for diabetes, have this test once every three years after 68 years of age.  If you are overweight and have a high risk for diabetes, consider being tested at a younger age or more often. PREVENTING INFECTION  Hepatitis B  If you have a higher risk for hepatitis B, you should be screened for this virus. You are considered at high risk for hepatitis B if:  You were born in a country where hepatitis B is common. Ask your health care provider which countries are considered high risk.  Your parents were born in a high-risk country, and you have not been immunized against hepatitis B (hepatitis B vaccine).  You have HIV or AIDS.  You use needles to inject street drugs.  You live with someone who has hepatitis B.  You have had sex with someone who has hepatitis B.  You get hemodialysis treatment.  You take certain medicines for conditions, including cancer, organ transplantation, and autoimmune conditions. Hepatitis C  Blood testing is recommended for:  Everyone born from [birth year] through 1963.  Anyone with known risk factors for hepatitis C. Sexually transmitted infections (STIs)  You should be screened for sexually transmitted infections (STIs) including gonorrhea and chlamydia if:  You are  sexually active and are younger than 68 years of age.  You are older than 68 years of age and your health care provider tells you that you are at risk for this type of infection.  Your sexual activity has changed since you were last screened and you are at an increased risk for chlamydia or gonorrhea. Ask your health care provider if you are at risk.  If you do not have HIV, but are at risk, it may be recommended that you take a prescription medicine daily to prevent HIV infection. This is called pre-exposure prophylaxis (PrEP). You are considered at risk if:  You are sexually active and do not regularly use condoms or know the HIV status of your partner(s).  You take drugs by injection.  You are sexually active with a partner who has HIV. Talk with your health care provider about whether you are at high risk of being infected with HIV. If you choose to begin PrEP, you should first be tested for HIV. You should then be tested every 3 months for as long as you are taking PrEP.  PREGNANCY   If you are premenopausal and you may become pregnant, ask your health care provider about preconception counseling.  If you may become pregnant, take 400 to 800  micrograms (mcg) of folic acid every day.  If you want to prevent pregnancy, talk to your health care provider about birth control (contraception). OSTEOPOROSIS AND MENOPAUSE   Osteoporosis is a disease in which the bones lose minerals and strength with aging. This can result in serious bone fractures. Your risk for osteoporosis can be identified using a bone density scan.  If you are 73 years of age or older, or if you are at risk for osteoporosis and fractures, ask your health care provider if you should be screened.  Ask your health care provider whether you should take a calcium or vitamin D supplement to lower your risk for osteoporosis.  Menopause may have certain physical symptoms and risks.  Hormone replacement therapy may reduce some  of these symptoms and risks. Talk to your health care provider about whether hormone replacement therapy is right for you.  HOME CARE INSTRUCTIONS   Schedule regular health, dental, and eye exams.  Stay current with your immunizations.   Do not use any tobacco products including cigarettes, chewing tobacco, or electronic cigarettes.  If you are pregnant, do not drink alcohol.  If you are breastfeeding, limit how much and how often you drink alcohol.  Limit alcohol intake to no more than 1 drink per day for nonpregnant women. One drink equals 12 ounces of beer, 5 ounces of wine, or 1 ounces of hard liquor.  Do not use street drugs.  Do not share needles.  Ask your health care provider for help if you need support or information about quitting drugs.  Tell your health care provider if you often feel depressed.  Tell your health care provider if you have ever been abused or do not feel safe at home.   This information is not intended to replace advice given to you by your health care provider. Make sure you discuss any questions you have with your health care provider.   Document Released: 07/20/2010 Document Revised: 01/25/2014 Document Reviewed: 12/06/2012 Elsevier Interactive Patient Education [employer].

## 2015-08-14 NOTE — Progress Notes (Signed)
Subjective:   [name] is a 70 y.o. female who presents for an Initial Medicare Annual Wellness Visit.   HRA assessment completed during this visit with [name]   The Patient was informed that the wellness visit is to identify future health risk and educate and initiate measures that can reduce risk for increased disease through the lifespan.    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 Maris Berger tomorrow;  TKR Nov 9th 2016 Uses the cane in the home [place] Rile outside the home and is w her walker today.  Medications reviewed: states everything is correct  BMI: 35.5  Lost weight to 198 but rec'd steriod injeciton in Oct Goal weight 160;   Diet;  A2c down 7.7 to 5.7 Eating a lot of salads  Breakfast and dinner Breakfast; eats one egg; bacon or one sausage; grits Lunch; late when she eats breakfast;  If no lunch, she eats soup or [place] sandwich; Supper; salad; cucumbers; tomatoes and boiled egg; Cheese  Limited sweets -moderates to avoid over eating; ice cream x 1 per week etc   Teeth or Denture issues? Dentures upper and bottom  Exercise;  Does the exercise rx for knee x 2 every day bid PT taught her when in bad pain, cut back on exercise;  Compliant with home program  Arm strength building and chair exercise  Exercise in the bed and some in the am when she wakes up [facility] and she is looking forward to that.   HOME SAFETY one level apt. Moving into another apt; has mold and mildew in old apt. Moving to [facility]  has requested the first available one  floor apt. Son will move her as well as brother;  Support at church;  Gait: prognosis is good but long term    Personal safety issues reviewed for risk such as safe community; does not drive presently  Fall hx;  no UA or BOWEL incontinence; had a recent UTI and it is not treated  Functional losses from last year to this year? Recuperating  from surgery and continuing to get stronger;l  Pain in back at present; had injection and was pain free x 2 months  Given education on "Fall Prevention in the Home" for more safety tips the patient can apply as appropriate.   Move date is not planned at this time;    Any emotional problems? Anxious, depressed, irritable, sad or blue? Sometimes;  "I feel depressed due to pain."  Takes 3 tylenol for pain  Has scat transportation now and will take her to the gym will help her to get out  Risk for Depression reviewed:   Situational depression due to back pain but had good coping skills and good support. States she has a program of personal prayer and reading the Bible that assist her through difficult times as well as she has her own ministry.  Sleeps x 3 hours a night;   Cognitive; no; Does not feel she has issues at this time Manages checkbook, medications; no  failures of task Ad8 score reviewed for issues;  Issues making decisions; no  Less interest in hobbies / activities" no  Repeats questions, stories; family complaining: NO  Trouble using ordinary gadgets; microwave; computer: no  Forgets the month or year: no  Mismanaging finances: no  Missing apt: no but does write them down  Daily problems with thinking of memory NO Ad8 score is 0   Advanced Directive addressed; Agreed to take a form and reviewed with her  Counseling Health Maintenance Gaps:  Colonoscopy; just had one; 08/2024; (hx of cancer pancreatic in mother; 2 uncles; brother had colon removed; another brother passed away)  EKG: January 04, 2015 Mammogram: 02/2015/ hx of  breast cancer; Went to have bx and cancer was gone  Dexa/ had one 2 years ago ordered by Dr. Rodell Perna  Call to [facility] and confirmed dexa completed 04/2013/ will fax over results  Hearing: both ears some loss; sees audiologist    Ophthalmology exam; 07/2015; suppose to go the 25th MD out until August; Dr. Venetia Maxon  Beginning glaucoma and cataract Allergy; dry eyes    Immunizations Due: (Vaccines reviewed and educated regarding any overdue)  Prevnar; declines Zoster/ completed and entered   Established and updated Risk reviewed and appropriate referral made or health recommendations:  Current Care Team reviewed and updated   Cardiac Risk Factors include: advanced age (>27men, >5 women);hypertension;sedentary lifestyle;obesity (BMI >30kg/m2)     Objective:    Today's Vitals   08/14/15 1423  BP: (!) 146/80  Weight: 212 lb 4 oz (96.3 kg)  Height: 5\' 5"  (1.651 m)  Had back pain; Apt tomorrow with orth  Body mass index is 35.32 kg/m.   Current Medications (verified)  Current Outpatient Prescriptions:  .  ADVAIR HFA 115-21 MCG/ACT inhaler, INHALE 2 PUFFS INTO THE LUNGS TWO TIMES DAILY., Disp: 12 g, Rfl: 5 .  Alcohol Swabs (ALCOHOL PADS) 70 % PADS, Use to help clean site to check blood sugars daily Dx E11.9, Disp: 100 each, Rfl: 3 .  amitriptyline (ELAVIL) 50 MG tablet, Take one table by mouth every night at bedtime., Disp: 90 tablet, Rfl: 3 .  atorvastatin (LIPITOR) 40 MG tablet, Take 1 tablet (40 mg total) by mouth daily., Disp: 90 tablet, Rfl: 3 .  cholecalciferol (VITAMIN D) 1000 UNITS tablet, Take 1,000 Units by mouth daily., Disp: , Rfl:  .  clopidogrel (PLAVIX) 75 MG tablet, Take 75 mg by mouth daily., Disp: , Rfl:  .  cycloSPORINE (RESTASIS) 0.05 % ophthalmic emulsion, Place 1 drop into both eyes 2 (two) times daily. , Disp: , Rfl:  .  DEXILANT 60 MG capsule, TAKE 1 CAPSULE BY MOUTH ONCE DAILY, Disp: 90 capsule, Rfl: 3 .  diazepam (VALIUM) 5 MG tablet, TAKE 1 TABLET  BY MOUTH TWICE DAILY FOR MUSCLE SPASMS, Disp: 60 tablet, Rfl: 1 .  diltiazem (CARDIZEM CD) 180 MG 24 hr capsule, TAKE 1 CAPSULE BY MOUTH ONCE DAILY, Disp: 90 capsule, Rfl: 2 .  FIBER SELECT GUMMIES PO, Take 2 capsules by mouth daily., Disp: , Rfl:  .  Fluocinolone Acetonide 0.01 % OIL, Place 2 drops into both ears daily as needed. Does every two weeks, Disp: , Rfl:  .  gabapentin (NEURONTIN) 300 MG capsule, Take 2 capsules of 300 mg each at night by mouth between one and 2 hours prior to bedtime. You can take another 300 mg in daytime for pain., Disp: 270 capsule, Rfl: 3 .  glucose blood (ONE TOUCH ULTRA TEST) test strip, 1 each by  Other route 2 (two) times daily. Use to check blood sugars twice a day Dx E11.9, Disp: 100 each, Rfl: 3 .  ipratropium (ATROVENT) 0.06 % nasal spray, INSTILL 2 SPRAYS IN EACH NOSTRIL FOUR TIMES A DAY, Disp: 15 mL, Rfl: 2 .  isosorbide mononitrate (IMDUR) 60 MG 24 hr tablet, TAKE 1 TABLET BY MOUTH ONCE DAILY, Disp: 90 tablet, Rfl: 1 .  Lancets (ONETOUCH ULTRASOFT) lancets, 1 each by Other route 2 (two) times daily. Use to help check blood sugars twice a day Dx E11.9, Disp: 100 each, Rfl: 3 .  losartan (COZAAR) 100 MG tablet, TAKE 1 TABLET BY MOUTH EVERY MORNING, Disp: 90 tablet, Rfl: 1 .  methocarbamol (ROBAXIN) 500 MG tablet, Take 1 tablet (500 mg total) by mouth every 6 (six) hours as needed for muscle spasms., Disp: 60 tablet, Rfl: 0 .  metoCLOPramide (REGLAN) 10 MG tablet, Take 1 tablet (10 mg total) by mouth 4 (four) times daily -  before meals and at bedtime., Disp: 120 tablet, Rfl: 3 .  metroNIDAZOLE (FLAGYL) 500 MG tablet, Take 1 tablet (500 mg total) by mouth 2 (two) times daily., Disp: 14 tablet, Rfl: 0 .  nitroGLYCERIN (NITROLINGUAL) 0.4 MG/SPRAY spray, Place 1 spray under the tongue every 5 (five) minutes as needed. Chest pains, Disp: 12 g, Rfl: 3 .  olopatadine (PATANOL) 0.1 % ophthalmic solution, Insert 1 drop in both eyes twice daily, Disp: , Rfl: 6 .  PROAIR  HFA 108 (90 Base) MCG/ACT inhaler, INHALE 2 PUFFS BY MOUTH EVERY 6 HOURS FOR WHEEZING AND SHORTNESS OF BREATH, Disp: 17 each, Rfl: 1 .  Saxagliptin-Metformin 05-998 MG TB24, Take 1 tablet by mouth daily. Take one tablet every evening with a meal., Disp: 90 tablet, Rfl: 3 .  topiramate (TOPAMAX) 100 MG tablet, TAKE 1 TABLET BY MOUTH EVERY EVENING, Disp: 30 tablet, Rfl: 1 .  topiramate (TOPAMAX) 25 MG tablet, Take 1 tablet (25 mg total) by mouth every morning., Disp: 90 tablet, Rfl: 3    Allergies (verified) Contrast media [iodinated diagnostic agents]; Gadolinium; Aspirin; Iohexol; and Trazodone and nefazodone   History: Past Medical History:  Diagnosis Date  . Asthma   . Benign neoplasm of colon 11/09/09   Colonoscopy 11/09/09 - FINDINGS: 1) 38mm polp in transverse colon, resected & retrieved.  2) Diverticulosis in sigmoid & descending colon.  3) Medium-sized, internal hemorrhoids.  . Breast cancer ([place]) 1989   Left breast cancer  . Chronic kidney disease    states she has a cyst on her kidney  . [facility]MC arthritis    Left middle finger  . Depression    40+ years  . Diabetes mellitus 10/2008   type 2  . Diverticulosis of colon (without mention of hemorrhage)    Colonoscopy 11/09/09 - FINDINGS: 1) 63mm polp in transverse colon, resected & retrieved.  2) Diverticulosis in sigmoid & descending colon.  3) Medium-sized, internal hemorrhoids.  . Esophagitis   . Essential hypertension, benign   . Facet arthropathy, lumbar    L3-4  . GERD (gastroesophageal reflux disease)   . Hemorrhage of rectum and anus    Colonoscopy 11/09/09 - FINDINGS: 1) 38mm polp in transverse colon, resected & retrieved.  2) Diverticulosis in sigmoid & descending colon.  3) Medium-sized, internal hemorrhoids.  . Hiatal hernia   . Hypercholesteremia   . Internal hemorrhoids without mention of complication    Colonoscopy 11/09/09 - FINDINGS: 1) 62mm polp in transverse colon, resected & retrieved.  2) Diverticulosis in  sigmoid &  descending colon.  3) Medium-sized, internal hemorrhoids.  . Myocardial infarction ([place]) [place] Multi W/Spect W/Wall Motion EF on 08/07/10 - IMPRESSION: 1) No evidence of ischemia.  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  3) Normal LV wall motion.  4) Estimated Q G S EF 70%, unchanged.  . Obese   . Obstructive sleep apnea    uses oxygen at night  . Paroxysmal atrial fibrillation (HCC)    a identified on LINQ  . Spondylolisthesis   . Syncope    MDT LinQ implanted for evaluation   Past Surgical History:  Procedure Laterality Date  . ABDOMINAL HYSTERECTOMY  1978  . BACK SURGERY     x2 one for infection  . BREAST BIOPSY Left 1995   Biopsy at [facility] ([facility])  . CHOLECYSTECTOMY    . COLONOSCOPY    . COLONOSCOPY W/ POLYPECTOMY  11/09/09   FINDINGS: 1) 93mm polp in transverse colon, resected & retrieved.  2) Diverticulosis in sigmoid & descending colon.  3) Medium-sized, internal hemorrhoids.  Marland Kitchen DILATION AND CURETTAGE OF UTERUS    . ESOPHAGOGASTRODUODENOSCOPY  02/15/07  . FOOT SURGERY Bilateral    Bil feet great toes and second toes spurs. By Dr. Milinda Pointer in 2004.  Marland Kitchen HEMORRHOID SURGERY  2011   Internal & external hemorrhoidectomy (x) 3 quadrants  . JOINT REPLACEMENT     lsft hip x2, rt knee  . KNEE ARTHROPLASTY Left 11/27/2014   Procedure: LEFT TOTAL KNEE ARTHROPLASTY;  Surgeon: Marybelle Killings, MD;  Location: [place];  Service: Orthopedics;  Laterality: Left;  . KNEE ARTHROSCOPY Right 2002   By Dr. Lorin Mercy  . LOOP RECORDER IMPLANT  11/15/06; 04/02/2013   MDT Reveal implanted 10/2006 for syncope; removed with MDT LinQ implanted 03/2013 by Dr Lovena Le  . LOOP RECORDER IMPLANT N/A 04/02/2013   Procedure: LOOP RECORDER IMPLANT;  Surgeon: Evans Lance, MD;  Location[facility] C[facility] LAB;  Service: Cardiovascular;  Laterality: N/A;  . TOE SURGERY    . TONSILLECTOMY    . TOTAL HIP REVISION  12/14/2010   Procedure: TOTAL  HIP REVISION;  Surgeon: Marybelle Killings;  Location: [place];  Service: Orthopedics;  Laterality: Left;  Left Total Hip Arthroplasty Revision, Poly and Ball Exchange, Possible Acetabular Revision vs. Poly Exchange  . TOTAL KNEE ARTHROPLASTY Left 11/27/2014  . TRIGGER FINGER RELEASE Left 01/02/2013   Procedure: RELEASE LEFT MIDDLE TRIGGER FINGER/LEFT INDEX AND LEFT RING FINGER;  Surgeon: Wynonia Sours, MD;  Location: [place];  Service: Orthopedics;  Laterality: Left;  ANESTHESIA: GENERAL/IV REGIONAL FAB   Family History  Problem Relation Age of Onset  . Pancreatic cancer Mother   . Heart attack Father   . Diabetes Son   . Arthritis Brother     Severe/Crippling  . Tuberculosis Brother   . Hypertension Brother     x 2  . Heart disease Sister   . Diabetes Brother   . Seizures Brother   . Autoimmune disease Brother     AIDS  . Colon cancer Brother   . Lung cancer Brother     mets   Social History   Occupational History  . Retired    Social History Main Topics  . Smoking status: Former Smoker    Types: Cigarettes    Quit date: 01/19/1983  . Smokeless tobacco: Never Used  . Alcohol use No  . Drug use: No  . Sexual  activity: Not on file    Tobacco Counseling Counseling given: Yes   Activities of Daily Living In your present state of health, do you have any difficulty performing the following activities: 08/14/2015 11/28/2014  Hearing? N N  Vision? Y N  Difficulty concentrating or making decisions? N N  Walking or climbing stairs? Y Y  Dressing or bathing? N N  Doing errands, shopping? N Y  Conservation officer, nature and eating ? N -  Using the Toilet? N -  In the past six months, have you accidently leaked urine? N -  Do you have problems with loss of bowel control? N -  Managing your Medications? N -  Managing your Finances? N -  Housekeeping or managing your Housekeeping? N -  Some recent data might be hidden    Immunizations and Health Maintenance Immunization  History  Administered Date(s) Administered  . Tdap 12/25/2014  . Zoster 06/19/2015   Zoster completed and documented at [facility];  Health Maintenance Due  Topic Date Due  . Hepatitis C Screening  November 04, 1945  . DEXA SCAN  04/15/2012  . PNA vac Low Risk Adult (1 of 2 - PCV13) 04/15/2012  . OPHTHALMOLOGY EXAM  08/02/2015  . FOOT EXAM  08/13/2015   Dexa completed 2015; copy requested from [facility]   Patient Care Team: Hoyt Koch, MD as PCP - General (Internal Medicine)  Indicate any recent Medical Services you may have received from other than [facility] providers in the past year (date may be approximate).     Assessment:     This is a routine wellness examination for [place].   Risk for CV disease reviewed; including BP; in pain today (back)  Cholesterol; A1c good;  diet and exercise reviewed ; Triglycerides 211 and reviewed  Diabetes Screening / checks BS bid; 115; this am Insomnia or fractured sleep is an issue; medically managed   Nutritional Counseling; She is losing weight by eating salads and is cutting back;   Osteopenia; [facility] completed bone density and requested report  Barriers to Success Home bound but great support system  Back pain  Take prednisone at times   Hearing/Vision screen ; was cancelled by MD and to reschedule in August   Dietary issues and exercise activities discussed Current Exercise Habits: Home exercise routine, Time (Minutes): 30, Frequency (Times/Week): 4, Weekly Exercise (Minutes/Week): 120, Intensity: Mild  Goals    . Weight (lb) < 160 lb (72.6 kg)          Watches what I eat Continue to eat salads and drinking water Limited sweets;  Keep it up!      Depression Screen PHQ 2/9 Scores 08/14/2015 12/24/2014  PHQ - 2 Score 3 0  PHQ- 9 Score 9 -    Fall Risk Fall Risk  08/14/2015 12/24/2014  Falls in the past year? No No    Cognitive Function: MMSE - Mini Mental State Exam 08/14/2015  Not completed: (No Data)  AD8 score 0    Screening Tests Health Maintenance  Topic Date Due  . Hepatitis C Screening  1945/12/04  . DEXA SCAN  04/15/2012  . PNA vac Low Risk Adult (1 of 2 - PCV13) 04/15/2012  . OPHTHALMOLOGY EXAM  08/02/2015  . FOOT EXAM  08/13/2015  . INFLUENZA VACCINE  12/24/2015 (Originally 08/19/2015)  . ZOSTAVAX  07/06/2016 (Originally 04/16/2007)  . HEMOGLOBIN A1C  01/06/2016  . MAMMOGRAM  02/20/2017  . COLONOSCOPY  09/09/2024  . TETANUS/TDAP  12/24/2024      Plan:  Given information regarding free hearing aid  Reviewed [facility]'s Advanced Directive and reviewed the form  During the course of the visit, Tricha was educated and counseled about the following appropriate screening and preventive services:   Vaccines to include Pneumoccal, Influenza, Hepatitis B, Td, Zostavax, HCV  Declined Pneumonia vaccine  Electrocardiogram  Cardiovascular disease screening  Colorectal cancer screening- just updated;   Bone density screening/ [facility] to fax report   Diabetes screening/ well managed at present; tol meds well   Glaucoma screening; postponed until 08/2015  Mammography/neg  Nutrition counseling- trying to lose weight  Smoking cessation counseling/ quit many years ago and only occasional smoking  Patient Instructions (the written plan) were given to the patient.    Wynetta Fines, RN   08/14/2015

## 2015-08-15 DIAGNOSIS — M545 Low back pain: Secondary | ICD-10-CM | POA: Diagnosis not present

## 2015-09-01 ENCOUNTER — Ambulatory Visit (INDEPENDENT_AMBULATORY_CARE_PROVIDER_SITE_OTHER): Payer: Medicare Other | Admitting: *Deleted

## 2015-09-01 DIAGNOSIS — R55 Syncope and collapse: Secondary | ICD-10-CM | POA: Diagnosis not present

## 2015-09-02 DIAGNOSIS — J45998 Other asthma: Secondary | ICD-10-CM | POA: Diagnosis not present

## 2015-09-02 NOTE — Progress Notes (Signed)
Carelink Summary Report / Loop Recorder 

## 2015-09-03 MED ORDER — METOCLOPRAMIDE HCL 10 MG PO TABS: 10.0000 mg | ORAL_TABLET | Freq: Three times a day (TID) | ORAL | 3 refills | Status: DC

## 2015-09-04 LAB — CUP PACEART REMOTE DEVICE CHECK: Date Time Interrogation Session: 20170716014450

## 2015-09-04 NOTE — Progress Notes (Signed)
Medical screening examination/treatment/procedure(s) were performed by non-physician practitioner and as supervising physician I was immediately available for consultation/collaboration. I agree with above. Adamae Ricklefs A Tacora Athanas, MD 

## 2015-09-05 ENCOUNTER — Other Ambulatory Visit: Payer: Self-pay | Admitting: Internal Medicine

## 2015-09-06 ENCOUNTER — Encounter (INDEPENDENT_AMBULATORY_CARE_PROVIDER_SITE_OTHER): Payer: Self-pay

## 2015-09-09 NOTE — Telephone Encounter (Signed)
Faxed to pharmacy

## 2015-09-10 DIAGNOSIS — M47816 Spondylosis without myelopathy or radiculopathy, lumbar region: Secondary | ICD-10-CM | POA: Diagnosis not present

## 2015-09-22 IMAGING — US US SOFT TISSUE HEAD/NECK
1 series · 14 of 25 positions shown · non-contrast
Comparison: None.

CLINICAL DATA: With upper bilateral neck pain for the past 4
months.

EXAM:
ULTRASOUND OF HEAD/NECK SOFT TISSUES
TECHNIQUE: Ultrasound examination of the head and neck soft tissues was
performed in the area of clinical concern.

[Series 1: us soft tissue head/neck · 0.10mm/px · 14 of 25 slices shown]
[im 1/25]
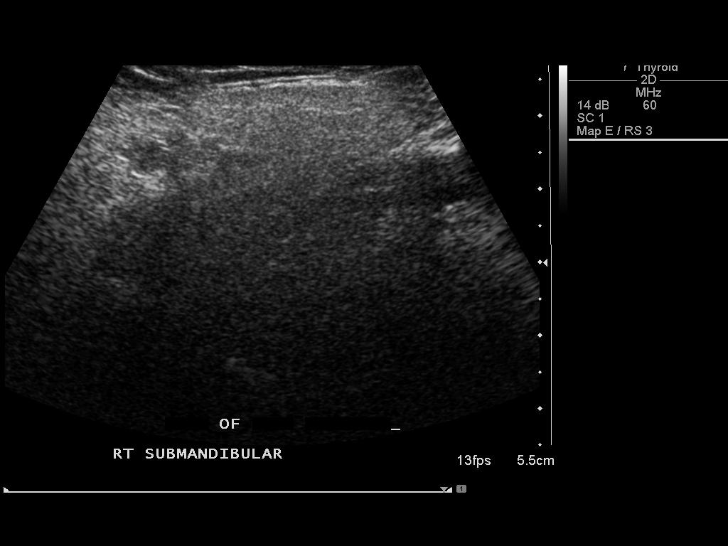
[im 3/25]
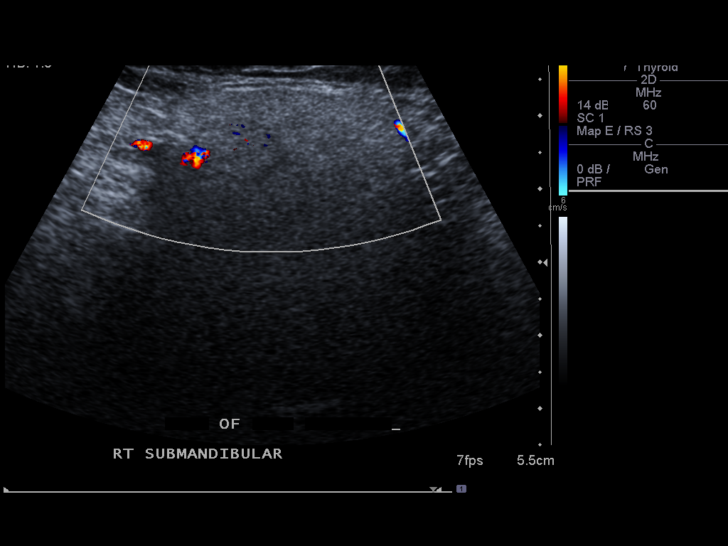
[im 5/25]
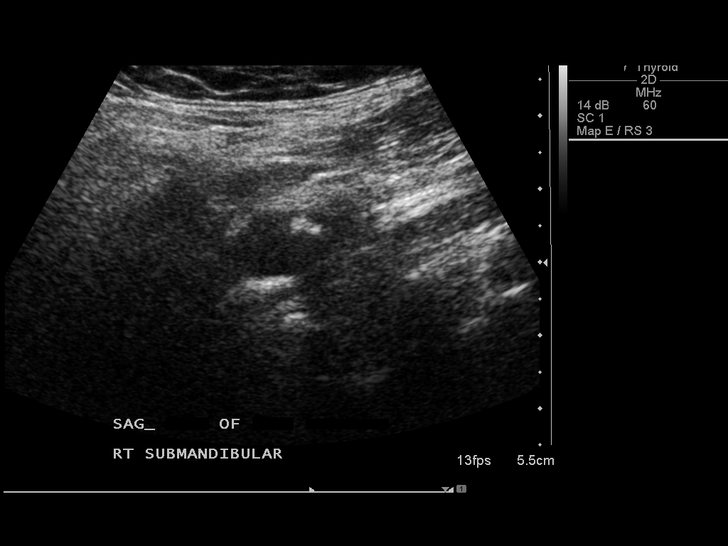
[im 7/25]
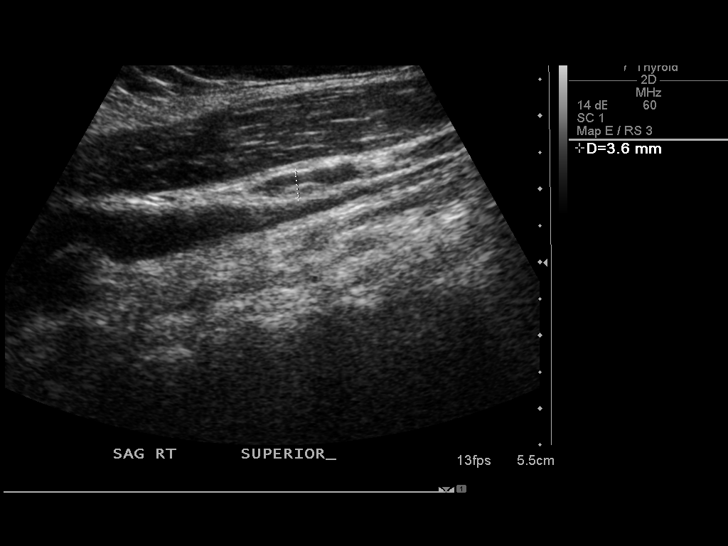
[im 9/25]
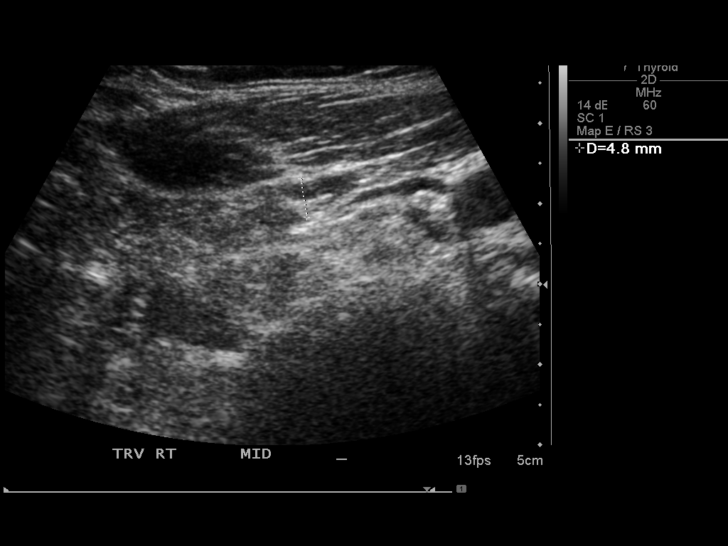
[im 10/25]
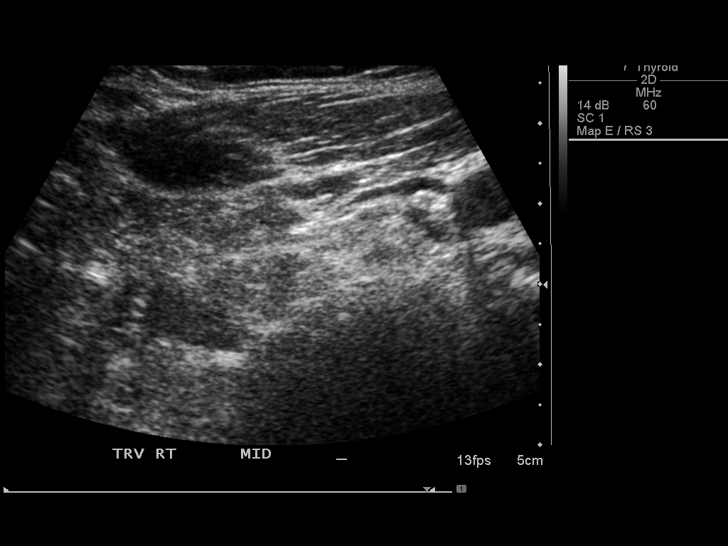
[im 12/25]
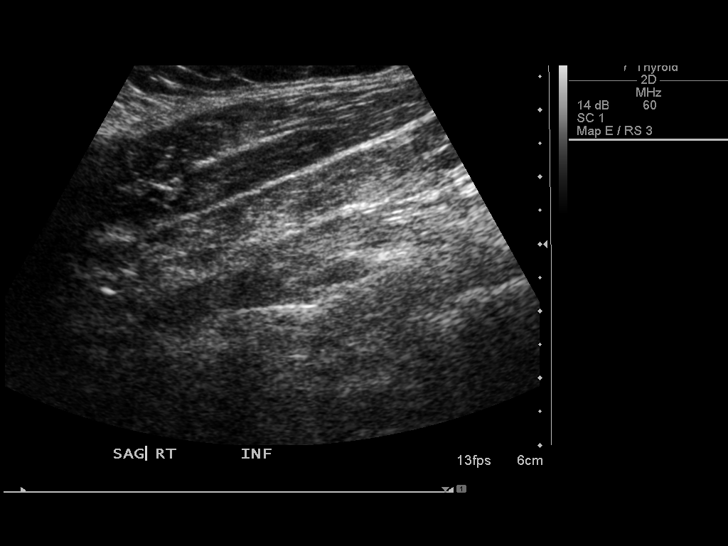
[im 14/25]
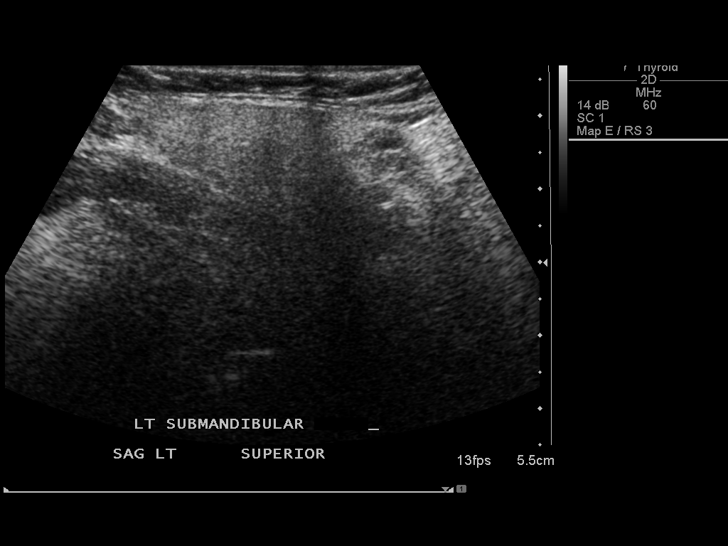
[im 16/25]
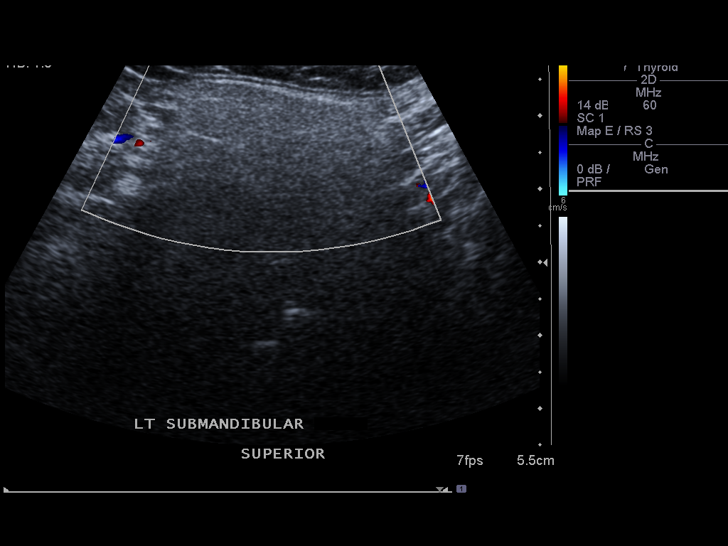
[im 17/25]
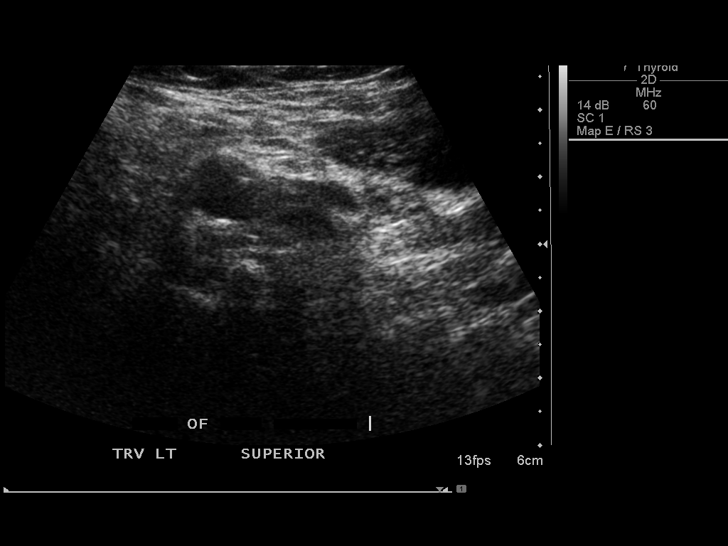
[im 19/25]
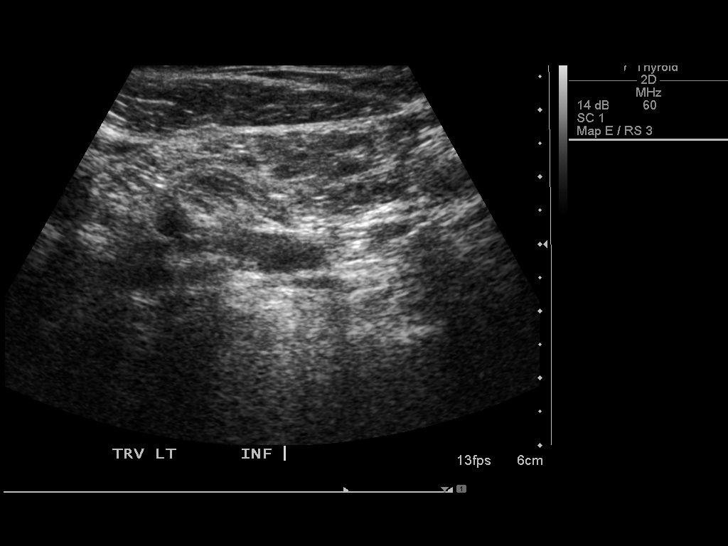
[im 21/25]
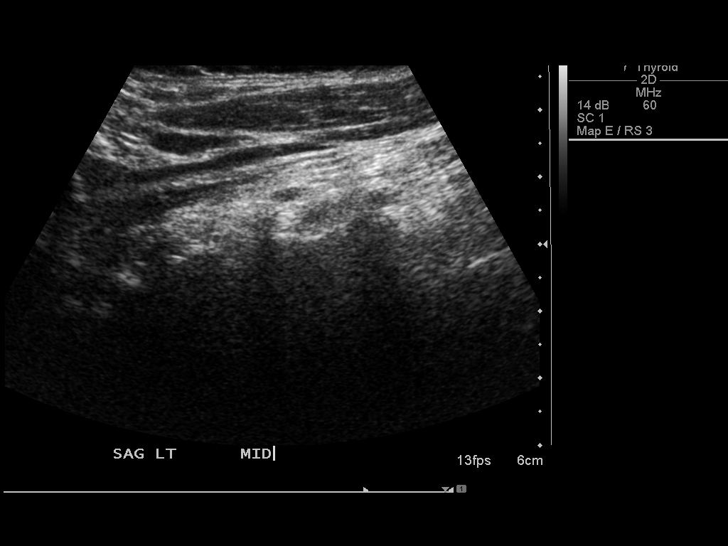
[im 23/25]
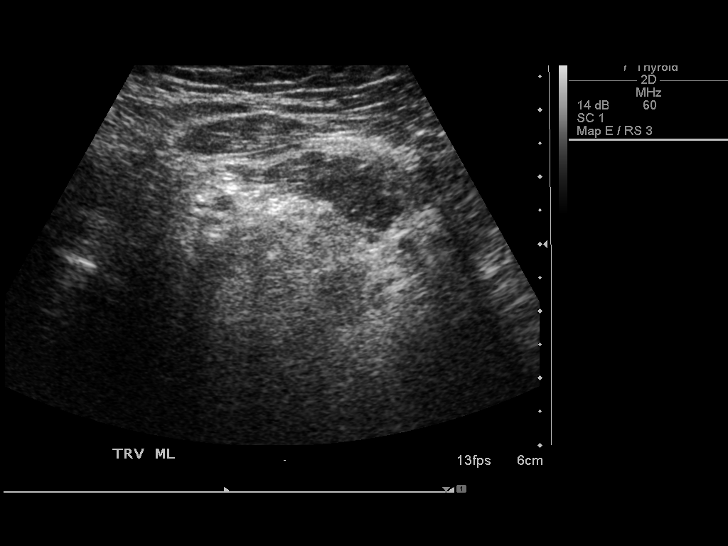
[im 25/25]
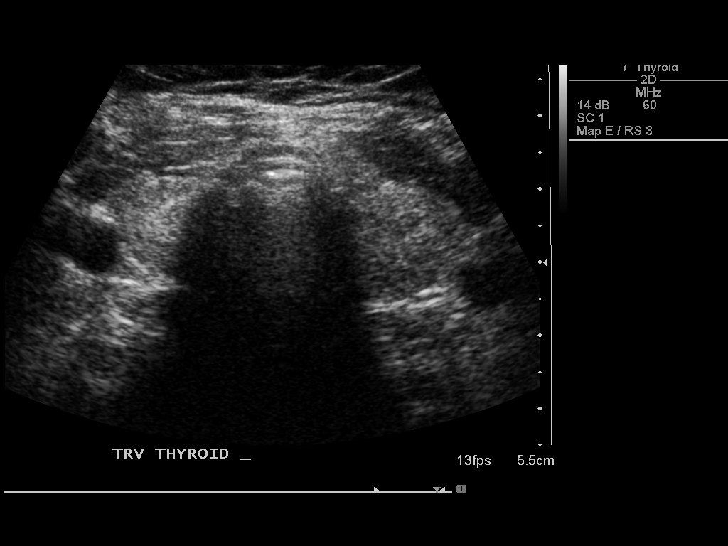

[14 of 25 positions shown; findings below may reference images not displayed]

FINDINGS: Sonographic interrogation of the regions of tenderness in the
bilateral submandibular space demonstrates symmetric appearing
submandibular glands. No evidence of mass, cyst, abscess or other
lesion. No ductal ectasia. No lymphadenopathy.
IMPRESSION: Regions of tenderness correspond sonographic with the right and left
submandibular glands. Their sonographic appearance is unremarkable.

Query sialoadenitis?

## 2015-09-25 ENCOUNTER — Telehealth: Payer: Self-pay | Admitting: Internal Medicine

## 2015-09-25 ENCOUNTER — Telehealth: Payer: Self-pay | Admitting: Cardiology

## 2015-09-25 NOTE — Telephone Encounter (Signed)
[facility] requesting that pt send manual transmission b/c home monitor has not updated in at least 14 days.    

## 2015-09-25 NOTE — Telephone Encounter (Signed)
Informed pt that the remote transmission that she sent today was received. Pt verbalized understanding.

## 2015-09-25 NOTE — Telephone Encounter (Signed)
Follow Up:    Calling regarding letter she received,stating that you had not received her transmission in 14 days

## 2015-09-27 LAB — CUP PACEART REMOTE DEVICE CHECK: Date Time Interrogation Session: 20170815020935

## 2015-10-01 ENCOUNTER — Ambulatory Visit (INDEPENDENT_AMBULATORY_CARE_PROVIDER_SITE_OTHER): Payer: Medicare Other | Admitting: *Deleted

## 2015-10-01 DIAGNOSIS — R55 Syncope and collapse: Secondary | ICD-10-CM | POA: Diagnosis not present

## 2015-10-02 NOTE — Progress Notes (Signed)
Carelink Summary Report / Loop Recorder 

## 2015-10-03 DIAGNOSIS — J45998 Other asthma: Secondary | ICD-10-CM | POA: Diagnosis not present

## 2015-10-07 ENCOUNTER — Other Ambulatory Visit: Payer: Self-pay | Admitting: Internal Medicine

## 2015-10-09 ENCOUNTER — Telehealth: Payer: Self-pay | Admitting: Cardiology

## 2015-10-09 NOTE — Telephone Encounter (Signed)
Spoke w/ pt and requested that she send a manual transmission b/c her home monitor has not updated in at least 14 days. She recently moved and she is not sure where it is. Once she finds it she will send the transmission.

## 2015-10-13 ENCOUNTER — Encounter: Payer: Self-pay | Admitting: Internal Medicine

## 2015-10-13 DIAGNOSIS — M542 Cervicalgia: Secondary | ICD-10-CM | POA: Diagnosis not present

## 2015-10-13 DIAGNOSIS — M47812 Spondylosis without myelopathy or radiculopathy, cervical region: Secondary | ICD-10-CM | POA: Diagnosis not present

## 2015-10-17 ENCOUNTER — Encounter: Payer: Self-pay | Admitting: Cardiology

## 2015-10-22 DIAGNOSIS — M47816 Spondylosis without myelopathy or radiculopathy, lumbar region: Secondary | ICD-10-CM | POA: Diagnosis not present

## 2015-10-25 LAB — CUP PACEART REMOTE DEVICE CHECK: MDC IDC SESS DTM: 20170914020924

## 2015-10-25 NOTE — Progress Notes (Signed)
[facility] summary report received. Battery status OK. Normal device function. No new symptom episodes, tachy episodes. No new AF episodes. Chronic pause and brady episodes that are undersensing.  Monthly summary reports and ROV/PRN

## 2015-10-30 ENCOUNTER — Encounter: Payer: Self-pay | Admitting: Cardiology

## 2015-10-31 ENCOUNTER — Encounter: Payer: Medicare Other | Admitting: *Deleted

## 2015-11-02 DIAGNOSIS — J45998 Other asthma: Secondary | ICD-10-CM | POA: Diagnosis not present

## 2015-11-04 ENCOUNTER — Other Ambulatory Visit: Payer: Self-pay | Admitting: Internal Medicine

## 2015-11-05 NOTE — Telephone Encounter (Signed)
Sent to pharmacy 

## 2015-11-12 ENCOUNTER — Ambulatory Visit: Payer: Medicare Other | Admitting: Podiatry

## 2015-11-12 DIAGNOSIS — M47812 Spondylosis without myelopathy or radiculopathy, cervical region: Secondary | ICD-10-CM | POA: Diagnosis not present

## 2015-11-19 ENCOUNTER — Encounter: Payer: Self-pay | Admitting: Podiatry

## 2015-11-19 ENCOUNTER — Ambulatory Visit (INDEPENDENT_AMBULATORY_CARE_PROVIDER_SITE_OTHER): Payer: Medicare Other | Admitting: Podiatry

## 2015-11-19 VITALS — BP 108/61 | HR 99 | Resp 18

## 2015-11-19 DIAGNOSIS — M79676 Pain in unspecified toe(s): Secondary | ICD-10-CM | POA: Diagnosis not present

## 2015-11-19 DIAGNOSIS — B351 Tinea unguium: Secondary | ICD-10-CM | POA: Diagnosis not present

## 2015-11-19 NOTE — Patient Instructions (Signed)
Diabetes and Foot Care Diabetes may cause you to have problems because of poor blood supply (circulation) to your feet and legs. This may cause the skin on your feet to become thinner, break easier, and heal more slowly. Your skin may become dry, and the skin may peel and crack. You may also have nerve damage in your legs and feet causing decreased feeling in them. You may not notice minor injuries to your feet that could lead to infections or more serious problems. Taking care of your feet is one of the most important things you can do for yourself.  HOME CARE INSTRUCTIONS  Wear shoes at all times, even in the house. Do not go barefoot. Bare feet are easily injured.  Check your feet daily for blisters, cuts, and redness. If you cannot see the bottom of your feet, use a mirror or ask someone for help.  Wash your feet with warm water (do not use hot water) and mild soap. Then pat your feet and the areas between your toes until they are completely dry. Do not soak your feet as this can dry your skin.  Apply a moisturizing lotion or petroleum jelly (that does not contain alcohol and is unscented) to the skin on your feet and to dry, brittle toenails. Do not apply lotion between your toes.  Trim your toenails straight across. Do not dig under them or around the cuticle. File the edges of your nails with an emery board or nail file.  Do not cut corns or calluses or try to remove them with medicine.  Wear clean socks or stockings every day. Make sure they are not too tight. Do not wear knee-high stockings since they may decrease blood flow to your legs.  Wear shoes that fit properly and have enough cushioning. To break in new shoes, wear them for just a few hours a day. This prevents you from injuring your feet. Always look in your shoes before you put them on to be sure there are no objects inside.  Do not cross your legs. This may decrease the blood flow to your feet.  If you find a minor scrape,  cut, or break in the skin on your feet, keep it and the skin around it clean and dry. These areas may be cleansed with mild soap and water. Do not cleanse the area with peroxide, alcohol, or iodine.  When you remove an adhesive bandage, be sure not to damage the skin around it.  If you have a wound, look at it several times a day to make sure it is healing.  Do not use heating pads or hot water bottles. They may burn your skin. If you have lost feeling in your feet or legs, you may not know it is happening until it is too late.  Make sure your health care provider performs a complete foot exam at least annually or more often if you have foot problems. Report any cuts, sores, or bruises to your health care provider immediately. SEEK MEDICAL CARE IF:   You have an injury that is not healing.  You have cuts or breaks in the skin.  You have an ingrown nail.  You notice redness on your legs or feet.  You feel burning or tingling in your legs or feet.  You have pain or cramps in your legs and feet.  Your legs or feet are numb.  Your feet always feel cold. SEEK IMMEDIATE MEDICAL CARE IF:   There is increasing redness,   swelling, or pain in or around a wound.  There is a red line that goes up your leg.  Pus is coming from a wound.  You develop a fever or as directed by your health care provider.  You notice a bad smell coming from an ulcer or wound.   This information is not intended to replace advice given to you by your health care provider. Make sure you discuss any questions you have with your health care provider.   Document Released: 01/02/2000 Document Revised: 09/06/2012 Document Reviewed: 06/13/2012 Elsevier Interactive Patient Education 2016 [employer]vier Inc[employer]  

## 2015-11-19 NOTE — Progress Notes (Signed)
Patient ID: [name], female   DOB: 11-Sep-1945, 70 y.o.   MRN: [id number]    Subjective: This patient presents for ongoing debridement of toenails which patient complains about walking wearing shoes and request toenail debridement  Objective: Orientated 3 DP and PT pulses 2/4 bilaterally Capillary reflex immediate bilaterally Sensation to 10 g monofilament wire intact 5/5 bilaterally Vibratory sensation reactive bilaterally Ankle reflex equal and reactive bilaterally Well-healed surgical scars dorsal first and second MPJ bilaterally Restricted range of motion of first MPJ bilaterally Toenails are elongated, brittle, discolored, elongated and tender direct palpation No open skin lesions bilaterally well-healed surgical scars dorsal aspect right and left feet  Assessment: 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

## 2015-11-21 ENCOUNTER — Ambulatory Visit (INDEPENDENT_AMBULATORY_CARE_PROVIDER_SITE_OTHER): Payer: Medicare Other

## 2015-11-21 ENCOUNTER — Ambulatory Visit (INDEPENDENT_AMBULATORY_CARE_PROVIDER_SITE_OTHER): Payer: Medicare Other | Admitting: Sports Medicine

## 2015-11-21 ENCOUNTER — Encounter (INDEPENDENT_AMBULATORY_CARE_PROVIDER_SITE_OTHER): Payer: Self-pay | Admitting: Sports Medicine

## 2015-11-21 VITALS — BP 122/73 | HR 92 | Ht 65.0 in | Wt 212.0 lb

## 2015-11-21 DIAGNOSIS — M25562 Pain in left knee: Secondary | ICD-10-CM | POA: Diagnosis not present

## 2015-11-21 DIAGNOSIS — M25551 Pain in right hip: Secondary | ICD-10-CM | POA: Diagnosis not present

## 2015-11-21 DIAGNOSIS — M1611 Unilateral primary osteoarthritis, right hip: Secondary | ICD-10-CM

## 2015-11-21 DIAGNOSIS — G8929 Other chronic pain: Secondary | ICD-10-CM | POA: Diagnosis not present

## 2015-11-21 NOTE — Progress Notes (Signed)
[name] - 70 y.o. female MRN [id number]  Date of birth: [birth year]  Office Visit Note: Visit Date: 11/21/2015 PCP: Hoyt Koch, MD Referred by: Hoyt Koch, *  Subjective: Chief Complaint  Patient presents with  . Left Knee - Pain  . Right Hip - Pain  . Difficulty Walking   HPI: Patient ambulates with a walker. Had left TKA on 11/27/14.  States left knee pain since surgery.  Pain is getting less, but if she sits to long the pain comes stronger.  No swelling.  States has gotten stronger and doing exercises.  She is regained the majority of her range of motion but continues to have difficulty walking. She attributes some of this to right hip pain. She is discussed right hip injection previously & is interested in having this performed. Pain is located in the right groin & she demonstrates a positive C sign.    ROS Otherwise per HPI.  Assessment & Plan: Visit Diagnoses:  1. Chronic pain of left knee   2. Pain of right hip joint   3. Unilateral primary osteoarthritis, right hip     Plan: Findings:  Left knee seems to be improving following surgery although slowly. Overall the hardware appears to be intact. For her difficulty walking ID thinks some of this is coming from right hip osteoarthritis given the marked pain with internal & external rotation of her hip in the distribution of her pain.. I would like for her to undergo the diagnostic & therapeutic intra-articular hip injection with Dr. Ernestina Patches. She should follow-up with Dr. Lorin Mercy to discuss the results of the right hip injection & for clinical recheck of the left knee.    Meds & Orders: No orders of the defined types were placed in this encounter.   Orders Placed This Encounter  Procedures  . XR Knee 1-2 Views Left  . Ambulatory referral to Physical Medicine Rehab    Follow-up: Return in about 8 weeks (around 01/16/2016) for with Dr. Lorin Mercy to review results of R hip Injection.   Procedures: No  procedures performed  No notes on file   Clinical History: No specialty comments available.  She reports that she quit smoking about 32 years ago. Her smoking use included Cigarettes. She has never used smokeless tobacco.   Recent Labs  11/27/14 1041 07/07/15 1521  HGBA1C 6.6* 5.7    Objective:  VS:  HT:5\' 5"  (165.1 cm)   WT:212 lb (96.2 kg)  BMI:35.4    BP:122/73  HR:92bpm  TEMP: ( )  RESP:  Physical Exam  Constitutional: She appears well-developed and well-nourished. No distress.  Alert and appropriately interactive.  HENT:  Head: Normocephalic and atraumatic.  Pulmonary/Chest: Effort normal. No respiratory distress.  Musculoskeletal:  Left knee is overall well aligned midline incision is well-healed. ROM: 0 to 105. She is stable to varus & valgus testing. Next  Right hip she has marked pain with internal & external rotation of the right hip. Positive Stinchfield test. Pain with Corky Sox as well as FADIR. Negative straight leg raise.  Skin: Skin is warm and dry. No rash noted. She is not diaphoretic. No erythema. No pallor.  Psychiatric: She has a normal mood and affect. Her behavior is normal. Judgment and thought content normal.    Ortho Exam Imaging: No results found.  Past Medical/Family/Surgical/Social History: Medications & Allergies reviewed per EMR Patient Active Problem List   Diagnosis Date Noted  . Atrial fibrillation ([place]) 01/07/2015  . Constipation 12/24/2014  .  Status post total left knee replacement 11/27/2014  . Insomnia due to medical condition 10/15/2014  . DDD (degenerative disc disease), lumbar 10/15/2014  . Arthritis, senescent 10/15/2014  . Preventative health care 08/13/2014  . Asthma 05/17/2014  . Obstructive sleep apnea   . Other type of migraine   . DM (diabetes mellitus) type II controlled, neurological manifestation ([place])   . Dyslipidemia 11/12/2008  . Essential hypertension, benign 11/12/2008  . SYNCOPE 11/12/2008   Past Medical  History:  Diagnosis Date  . Asthma   . Benign neoplasm of colon 11/09/09   Colonoscopy 11/09/09 - FINDINGS: 1) 73mm polp in transverse colon, resected & retrieved.  2) Diverticulosis in sigmoid & descending colon.  3) Medium-sized, internal hemorrhoids.  . Breast cancer ([place]) 1989   Left breast cancer  . Chronic kidney disease    states she has a cyst on her kidney  . CMC arthritis    Left middle finger  . Depression    40+ years  . Diabetes mellitus 10/2008   type 2  . Diverticulosis of colon (without mention of hemorrhage)    Colonoscopy 11/09/09 - FINDINGS: 1) 45mm polp in transverse colon, resected & retrieved.  2) Diverticulosis in sigmoid & descending colon.  3) Medium-sized, internal hemorrhoids.  . Esophagitis   . Essential hypertension, benign   . Facet arthropathy, lumbar    L3-4  . GERD (gastroesophageal reflux disease)   . Hemorrhage of rectum and anus    Colonoscopy 11/09/09 - FINDINGS: 1) 53mm polp in transverse colon, resected & retrieved.  2) Diverticulosis in sigmoid & descending colon.  3) Medium-sized, internal hemorrhoids.  . Hiatal hernia   . Hypercholesteremia   . Internal hemorrhoids without mention of complication    Colonoscopy 11/09/09 - FINDINGS: 1) 5mm polp in transverse colon, resected & retrieved.  2) Diverticulosis in sigmoid & descending colon.  3) Medium-sized, internal hemorrhoids.  . Myocardial infarction [place] W/Spect W/Wall Motion EF on 08/07/10 - IMPRESSION: 1) No evidence of ischemia.  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  3) Normal LV wall motion.  4) Estimated Q G S EF 70%, unchanged.  . Obese   . Obstructive sleep apnea    uses oxygen at night  . Paroxysmal atrial fibrillation (HCC)    a identified on LINQ  . Spondylolisthesis   . Syncope    MDT LinQ implanted for evaluation   Family History  Problem Relation Age of Onset  . Pancreatic cancer Mother   . Heart  attack Father   . Diabetes Son   . Arthritis Brother     Severe/Crippling  . Tuberculosis Brother   . Hypertension Brother     x 2  . Heart disease Sister   . Diabetes Brother   . Seizures Brother   . Autoimmune disease Brother     AIDS  . Colon cancer Brother   . Lung cancer Brother     mets   Past Surgical History:  Procedure Laterality Date  . ABDOMINAL HYSTERECTOMY  1978  . BACK SURGERY     x2 one for infection  . BREAST BIOPSY Left 1995   Biopsy at [facility] (BENIGN)  . CHOLECYSTECTOMY    . COLONOSCOPY    . COLONOSCOPY W/ POLYPECTOMY  11/09/09   FINDINGS: 1) 67mm polp in transverse colon, resected & retrieved.  2) Diverticulosis in sigmoid & descending colon.  3) Medium-sized, internal hemorrhoids.  Marland Kitchen  DILATION AND CURETTAGE OF UTERUS    . ESOPHAGOGASTRODUODENOSCOPY  02/15/07  . FOOT SURGERY Bilateral    Bil feet great toes and second toes spurs. By Dr. Milinda Pointer in 2004.  Marland Kitchen HEMORRHOID SURGERY  2011   Internal & external hemorrhoidectomy (x) 3 quadrants  . JOINT REPLACEMENT     lsft hip x2, rt knee  . KNEE ARTHROPLASTY Left 11/27/2014   Procedure: LEFT TOTAL KNEE ARTHROPLASTY;  Surgeon: Marybelle Killings, MD;  Location: [place];  Service: Orthopedics;  Laterality: Left;  . KNEE ARTHROSCOPY Right 2002   By Dr. Lorin Mercy  . LOOP RECORDER IMPLANT  11/15/06; 04/02/2013   MDT Reveal implanted 10/2006 for syncope; removed with MDT LinQ implanted 03/2013 by Dr Lovena Le  . LOOP RECORDER IMPLANT N/A 04/02/2013   Procedure: LOOP RECORDER IMPLANT;  Surgeon: Evans Lance, MD;  Location: [facility] C[facility] LAB;  Service: Cardiovascular;  Laterality: N/A;  . TOE SURGERY    . TONSILLECTOMY    . TOTAL HIP REVISION  12/14/2010   Procedure: TOTAL HIP REVISION;  Surgeon: Marybelle Killings;  Location: [place];  Service: Orthopedics;  Laterality: Left;  Left Total Hip Arthroplasty Revision, Poly and Ball Exchange, Possible Acetabular Revision vs. Poly Exchange  . TOTAL KNEE ARTHROPLASTY Left 11/27/2014  .  TRIGGER FINGER RELEASE Left 01/02/2013   Procedure: RELEASE LEFT MIDDLE TRIGGER FINGER/LEFT INDEX AND LEFT RING FINGER;  Surgeon: Wynonia Sours, MD;  Location: [place];  Service: Orthopedics;  Laterality: Left;  ANESTHESIA: GENERAL/IV REGIONAL FAB   Social History   Occupational History  . Retired    Social History Main Topics  . Smoking status: Former Smoker    Types: Cigarettes    Quit date: 01/19/1983  . Smokeless tobacco: Never Used  . Alcohol use No  . Drug use: No  . Sexual activity: Not on file

## 2015-11-27 DIAGNOSIS — M47816 Spondylosis without myelopathy or radiculopathy, lumbar region: Secondary | ICD-10-CM | POA: Diagnosis not present

## 2015-11-27 DIAGNOSIS — M47812 Spondylosis without myelopathy or radiculopathy, cervical region: Secondary | ICD-10-CM | POA: Diagnosis not present

## 2015-11-27 DIAGNOSIS — I10 Essential (primary) hypertension: Secondary | ICD-10-CM | POA: Diagnosis not present

## 2015-12-01 ENCOUNTER — Ambulatory Visit (INDEPENDENT_AMBULATORY_CARE_PROVIDER_SITE_OTHER): Payer: Medicare Other | Admitting: Physical Medicine and Rehabilitation

## 2015-12-01 ENCOUNTER — Encounter (INDEPENDENT_AMBULATORY_CARE_PROVIDER_SITE_OTHER): Payer: Self-pay | Admitting: Physical Medicine and Rehabilitation

## 2015-12-01 ENCOUNTER — Ambulatory Visit (INDEPENDENT_AMBULATORY_CARE_PROVIDER_SITE_OTHER): Payer: Medicare Other | Admitting: *Deleted

## 2015-12-01 VITALS — BP 119/70 | HR 93

## 2015-12-01 DIAGNOSIS — R55 Syncope and collapse: Secondary | ICD-10-CM

## 2015-12-01 DIAGNOSIS — M25551 Pain in right hip: Secondary | ICD-10-CM | POA: Diagnosis not present

## 2015-12-01 NOTE — Patient Instructions (Signed)

## 2015-12-01 NOTE — Progress Notes (Signed)
[name] - 70 y.o. female MRN [id number]  Date of birth: [birth year]  Office Visit Note: Visit Date: 12/01/2015 PCP: Sydney Koch, MD Referred by: [name], *  Subjective: Chief Complaint  Patient presents with  . Right Hip - Pain   HPI: [name] is a patient with chronic osteoarthritis and chronic pain. She sees [name] for her lumbar spine he has completed some radiofrequency ablation. She's had surgery by [name]. She is also a left total knee arthroplasty that she says still gives her a lot of grief. She recently saw [name] and had more right hip and groin pain.  We are going to complete a diagnostic and therapeutic right anesthetic hip arthrogram. I did mention to her that she could talk to [name] about radiofrequency ablation of the left knee. I'm not sure that he does the geniculate nerves. She could also talked to him about the lumbar spine as a source of the knee pain.   Hip Pain   The incident occurred more than 1 week ago. The pain is present in the right hip. The quality of the pain is described as burning. The pain is at a severity of 10/10. The pain is severe. The pain has been constant since onset. The symptoms are aggravated by movement. She has tried ice, heat and acetaminophen for the symptoms. The treatment provided mild relief.    ROS Otherwise per HPI.  Assessment & Plan: Visit Diagnoses:  1. Pain in right hip     Plan: No additional findings.   Meds & Orders: No orders of the defined types were placed in this encounter.   Orders Placed This Encounter  Procedures  . Large Joint Injection/Arthrocentesis    Follow-up: Return for schedule fllow up with [name].   Procedures: Large Joint Inj Date/Time: 12/01/2015 2:20 PM Performed by: [name] Authorized by: [name]   Consent Given by:  Patient Site marked: the procedure site was marked   Timeout: prior to procedure the correct patient, procedure,  and site was verified   Indications:  Pain Location:  Hip Site:  R hip joint Prep: patient was prepped and draped in usual sterile fashion   Needle Size:  22 G Needle Length:  3.5 inches Approach:  Anterior Ultrasound Guidance: No   Fluoroscopic Guidance: No   Arthrogram: Yes   Medications:  4 mL lidocaine 2 %; 80 mg triamcinolone acetonide 40 MG/ML Aspiration Attempted: Yes   Patient tolerance:  Patient tolerated the procedure well with no immediate complications  Arthrogram demonstrated excellent flow of contrast throughout the joint surface without extravasation or obvious defect.  The patient had relief of symptoms during the anesthetic phase of the injection.      No notes on file   Clinical History: No specialty comments available.  She reports that she quit smoking about 32 years ago. Her smoking use included Cigarettes. She has never used smokeless tobacco.   Recent Labs  07/07/15 1521  HGBA1C 5.7    Objective:  VS:  HT:    WT:   BMI:     BP:119/70  HR:93bpm  TEMP: ( )  RESP:  Physical Exam  Musculoskeletal:  She ambulates with a walker. She has good distal strength.    Ortho Exam Imaging: No results found.  Past Medical/Family/Surgical/Social History: Medications & Allergies reviewed per EMR Patient Active Problem List   Diagnosis Date Noted  . Atrial fibrillation ([place]) 01/07/2015  . Constipation 12/24/2014  .  Status post total left knee replacement 11/27/2014  . Insomnia due to medical condition 10/15/2014  . DDD (degenerative disc disease), lumbar 10/15/2014  . Arthritis, senescent 10/15/2014  . Preventative health care 08/13/2014  . Asthma 05/17/2014  . Obstructive sleep apnea   . Other type of migraine   . DM (diabetes mellitus) type II controlled, neurological manifestation ([place])   . Dyslipidemia 11/12/2008  . Essential hypertension, benign 11/12/2008  . SYNCOPE 11/12/2008   Past Medical History:  Diagnosis Date  . Asthma   . Benign  neoplasm of colon 11/09/09   Colonoscopy 11/09/09 - FINDINGS: 1) 67mm polp in transverse colon, resected & retrieved.  2) Diverticulosis in sigmoid & descending colon.  3) Medium-sized, internal hemorrhoids.  . Breast cancer ([place]) 1989   Left breast cancer  . Chronic kidney disease    states she has a cyst on her kidney  . CMC arthritis    Left middle finger  . Depression    40+ years  . Diabetes mellitus 10/2008   type 2  . Diverticulosis of colon (without mention of hemorrhage)    Colonoscopy 11/09/09 - FINDINGS: 1) 4mm polp in transverse colon, resected & retrieved.  2) Diverticulosis in sigmoid & descending colon.  3) Medium-sized, internal hemorrhoids.  . Esophagitis   . Essential hypertension, benign   . Facet arthropathy, lumbar    L3-4  . GERD (gastroesophageal reflux disease)   . Hemorrhage of rectum and anus    Colonoscopy 11/09/09 - FINDINGS: 1) 40mm polp in transverse colon, resected & retrieved.  2) Diverticulosis in sigmoid & descending colon.  3) Medium-sized, internal hemorrhoids.  . Hiatal hernia   . Hypercholesteremia   . Internal hemorrhoids without mention of complication    Colonoscopy 11/09/09 - FINDINGS: 1) 55mm polp in transverse colon, resected & retrieved.  2) Diverticulosis in sigmoid & descending colon.  3) Medium-sized, internal hemorrhoids.  . Myocardial infarction [place] W/Spect W/Wall Motion EF on 08/07/10 - IMPRESSION: 1) No evidence of ischemia.  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  3) Normal LV wall motion.  4) Estimated Q G S EF 70%, unchanged.  . Obese   . Obstructive sleep apnea    uses oxygen at night  . Paroxysmal atrial fibrillation (HCC)    a identified on LINQ  . Spondylolisthesis   . Syncope    MDT LinQ implanted for evaluation   Family History  Problem Relation Age of Onset  . Pancreatic cancer Mother   . Heart attack Father   . Diabetes Son   . Arthritis  Brother     Severe/Crippling  . Tuberculosis Brother   . Hypertension Brother     x 2  . Heart disease Sister   . Diabetes Brother   . Seizures Brother   . Autoimmune disease Brother     AIDS  . Colon cancer Brother   . Lung cancer Brother     mets   Past Surgical History:  Procedure Laterality Date  . ABDOMINAL HYSTERECTOMY  1978  . BACK SURGERY     x2 one for infection  . BREAST BIOPSY Left 1995   Biopsy at [facility] (BENIGN)  . CHOLECYSTECTOMY    . COLONOSCOPY    . COLONOSCOPY W/ POLYPECTOMY  11/09/09   FINDINGS: 1) 46mm polp in transverse colon, resected & retrieved.  2) Diverticulosis in sigmoid & descending colon.  3) Medium-sized, internal hemorrhoids.  Marland Kitchen  DILATION AND CURETTAGE OF UTERUS    . ESOPHAGOGASTRODUODENOSCOPY  02/15/07  . FOOT SURGERY Bilateral    Bil feet great toes and second toes spurs. By Dr. Milinda Pointer in 2004.  Marland Kitchen HEMORRHOID SURGERY  2011   Internal & external hemorrhoidectomy (x) 3 quadrants  . JOINT REPLACEMENT     lsft hip x2, rt knee  . KNEE ARTHROPLASTY Left 11/27/2014   Procedure: LEFT TOTAL KNEE ARTHROPLASTY;  Surgeon: Marybelle Killings, MD;  Location: [place];  Service: Orthopedics;  Laterality: Left;  . KNEE ARTHROSCOPY Right 2002   By Dr. Lorin Mercy  . LOOP RECORDER IMPLANT  11/15/06; 04/02/2013   MDT Reveal implanted 10/2006 for syncope; removed with MDT LinQ implanted 03/2013 by Dr Lovena Le  . LOOP RECORDER IMPLANT N/A 04/02/2013   Procedure: LOOP RECORDER IMPLANT;  Surgeon: Evans Lance, MD;  Location: [facility] CATH LAB;  Service: Cardiovascular;  Laterality: N/A;  . TOE SURGERY    . TONSILLECTOMY    . TOTAL HIP REVISION  12/14/2010   Procedure: TOTAL HIP REVISION;  Surgeon: Marybelle Killings;  Location: [place];  Service: Orthopedics;  Laterality: Left;  Left Total Hip Arthroplasty Revision, Poly and Ball Exchange, Possible Acetabular Revision vs. Poly Exchange  . TOTAL KNEE ARTHROPLASTY Left 11/27/2014  . TRIGGER FINGER RELEASE Left 01/02/2013    Procedure: RELEASE LEFT MIDDLE TRIGGER FINGER/LEFT INDEX AND LEFT RING FINGER;  Surgeon: Wynonia Sours, MD;  Location: [place];  Service: Orthopedics;  Laterality: Left;  ANESTHESIA: GENERAL/IV REGIONAL FAB   Social History   Occupational History  . Retired    Social History Main Topics  . Smoking status: Former Smoker    Types: Cigarettes    Quit date: 01/19/1983  . Smokeless tobacco: Never Used  . Alcohol use No  . Drug use: No  . Sexual activity: Not on file

## 2015-12-02 ENCOUNTER — Other Ambulatory Visit: Payer: Self-pay | Admitting: Internal Medicine

## 2015-12-02 MED ORDER — LIDOCAINE HCL 2 % IJ SOLN
4.0000 mL | INTRAMUSCULAR | Status: AC | PRN
  Administered 2015-12-01: 4 mL

## 2015-12-02 MED ORDER — TRIAMCINOLONE ACETONIDE 40 MG/ML IJ SUSP
80.0000 mg | INTRAMUSCULAR | Status: AC | PRN
  Administered 2015-12-01: 80 mg via INTRA_ARTICULAR

## 2015-12-02 NOTE — Progress Notes (Signed)
Carelink Summary Report / Loop Recorder 

## 2015-12-03 ENCOUNTER — Other Ambulatory Visit: Payer: Self-pay | Admitting: *Deleted

## 2015-12-03 MED ORDER — NITROGLYCERIN 0.4 MG/SPRAY TL SOLN: 1.0000 | 1 refills | Status: DC | PRN

## 2015-12-24 DIAGNOSIS — M545 Low back pain: Secondary | ICD-10-CM | POA: Diagnosis not present

## 2015-12-25 ENCOUNTER — Other Ambulatory Visit: Payer: Self-pay | Admitting: Internal Medicine

## 2015-12-25 NOTE — Telephone Encounter (Signed)
Faxed to pharmacy

## 2015-12-26 ENCOUNTER — Encounter (INDEPENDENT_AMBULATORY_CARE_PROVIDER_SITE_OTHER): Payer: Self-pay | Admitting: Orthopaedic Surgery

## 2015-12-26 ENCOUNTER — Ambulatory Visit (INDEPENDENT_AMBULATORY_CARE_PROVIDER_SITE_OTHER): Payer: Medicare Other | Admitting: Orthopaedic Surgery

## 2015-12-26 DIAGNOSIS — M1611 Unilateral primary osteoarthritis, right hip: Secondary | ICD-10-CM
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
## 2015-12-29 ENCOUNTER — Encounter: Payer: Medicare Other | Admitting: Internal Medicine

## 2015-12-30 ENCOUNTER — Ambulatory Visit (INDEPENDENT_AMBULATORY_CARE_PROVIDER_SITE_OTHER): Payer: Medicare Other | Admitting: *Deleted

## 2015-12-30 ENCOUNTER — Other Ambulatory Visit: Payer: Self-pay | Admitting: Internal Medicine

## 2015-12-30 DIAGNOSIS — R55 Syncope and collapse: Secondary | ICD-10-CM | POA: Diagnosis not present

## 2015-12-31 ENCOUNTER — Encounter: Payer: Self-pay | Admitting: Internal Medicine

## 2015-12-31 NOTE — Progress Notes (Signed)
Carelink Summary Report / Loop Recorder 

## 2016-01-02 ENCOUNTER — Ambulatory Visit (INDEPENDENT_AMBULATORY_CARE_PROVIDER_SITE_OTHER): Payer: Medicare Other | Admitting: Internal Medicine

## 2016-01-02 ENCOUNTER — Other Ambulatory Visit (INDEPENDENT_AMBULATORY_CARE_PROVIDER_SITE_OTHER): Payer: Medicare Other

## 2016-01-02 ENCOUNTER — Encounter: Payer: Self-pay | Admitting: Internal Medicine

## 2016-01-02 VITALS — BP 130/66 | HR 96 | Temp 98.5°F | Resp 14 | Ht 64.5 in | Wt 212.0 lb

## 2016-01-02 DIAGNOSIS — E1142 Type 2 diabetes mellitus with diabetic polyneuropathy: Secondary | ICD-10-CM

## 2016-01-02 DIAGNOSIS — Z0000 Encounter for general adult medical examination without abnormal findings: Secondary | ICD-10-CM

## 2016-01-02 DIAGNOSIS — J45998 Other asthma: Secondary | ICD-10-CM | POA: Diagnosis not present

## 2016-01-02 DIAGNOSIS — J4530 Mild persistent asthma, uncomplicated: Secondary | ICD-10-CM

## 2016-01-02 DIAGNOSIS — E785 Hyperlipidemia, unspecified: Secondary | ICD-10-CM | POA: Diagnosis not present

## 2016-01-02 LAB — COMPREHENSIVE METABOLIC PANEL
ALK PHOS: 72 U/L (ref 39–117)
ALT: 9 U/L (ref 0–35)
AST: 9 U/L (ref 0–37)
Albumin: 4.3 g/dL (ref 3.5–5.2)
BUN: 8 mg/dL (ref 6–23)
CALCIUM: 9.4 mg/dL (ref 8.4–10.5)
CHLORIDE: 107 meq/L (ref 96–112)
CO2: 29 mEq/L (ref 19–32)
Creatinine, Ser: 0.78 mg/dL (ref 0.40–1.20)
GFR: 94.25 mL/min (ref >60.00)
Glucose, Bld: 106 mg/dL — ABNORMAL HIGH (ref 70–99)
POTASSIUM: 3.7 meq/L (ref 3.5–5.1)
Sodium: 143 mEq/L (ref 135–145)
TOTAL PROTEIN: 7.2 g/dL (ref 6.0–8.3)
Total Bilirubin: 0.3 mg/dL (ref 0.2–1.2)

## 2016-01-02 LAB — URINALYSIS, ROUTINE W REFLEX MICROSCOPIC
BILIRUBIN URINE: NEGATIVE
HGB URINE DIPSTICK: NEGATIVE
Ketones, ur: NEGATIVE
LEUKOCYTES UA: NEGATIVE
NITRITE: NEGATIVE
RBC / HPF: NONE SEEN (ref 0–?)
Specific Gravity, Urine: 1.01 (ref 1.000–1.030)
TOTAL PROTEIN, URINE-UPE24: NEGATIVE
Urine Glucose: NEGATIVE
Urobilinogen, UA: 0.2 (ref 0.0–1.0)
pH: 7.5 (ref 5.0–8.0)

## 2016-01-02 LAB — LIPID PANEL
CHOLESTEROL: 149 mg/dL (ref 0–200)
HDL: 48.2 mg/dL (ref >39.00)
LDL Cholesterol: 67 mg/dL (ref 0–99)
NONHDL: 101.27
Total CHOL/HDL Ratio: 3
Triglycerides: 169 mg/dL — ABNORMAL HIGH (ref 0.0–149.0)
VLDL: 33.8 mg/dL (ref 0.0–40.0)

## 2016-01-02 LAB — CBC
HEMATOCRIT: 33.5 % — AB (ref 36.0–46.0)
HEMOGLOBIN: 10.5 g/dL — AB (ref 12.0–15.0)
MCHC: 31.4 g/dL (ref 30.0–36.0)
MCV: 80.8 fl (ref 78.0–100.0)
PLATELETS: 386 10*3/uL (ref 150.0–400.0)
RBC: 4.14 Mil/uL (ref 3.87–5.11)
RDW: 17.3 % — AB (ref 11.5–15.5)
WBC: 12.2 10*3/uL — AB (ref 4.0–10.5)

## 2016-01-02 LAB — HEMOGLOBIN A1C: Hgb A1c MFr Bld: 5.8 % (ref 4.6–6.5)

## 2016-01-02 MED ORDER — FLUCONAZOLE 150 MG PO TABS: 150.0000 mg | ORAL_TABLET | Freq: Once | ORAL | 0 refills | Status: AC

## 2016-01-02 NOTE — Progress Notes (Signed)
   Subjective:    Patient ID: [name], female    DOB: 11-25-1945, 70 y.o.   MRN: [id number]  HPI The patient is a 70 YO female coming in for physical who has already had her wellness visit. No new concerns but want to review her conditions.   PMH, [facility], social history reviewed and updated.  Review of Systems  Constitutional: Negative.   HENT: Negative.   Eyes: Negative.   Respiratory: Negative for cough, chest tightness and shortness of breath.   Cardiovascular: Negative for chest pain, palpitations and leg swelling.  Gastrointestinal: Negative for abdominal distention, abdominal pain, constipation, diarrhea, nausea and vomiting.  Musculoskeletal: Positive for arthralgias and gait problem. Negative for myalgias, neck pain and neck stiffness.  Skin: Negative.   Neurological: Positive for numbness. Negative for dizziness and weakness.       Slight in her right foot.   Psychiatric/Behavioral: Negative.       Objective:   Physical Exam  Constitutional: She is oriented to person, place, and time. She appears well-developed and well-nourished.  HENT:  Head: Normocephalic and atraumatic.  Eyes: EOM are normal.  Neck: Normal range of motion.  Cardiovascular: Normal rate.   Pulmonary/Chest: Effort normal and breath sounds normal.  Abdominal: Soft. She exhibits no distension. There is no tenderness. There is no rebound.  Musculoskeletal: She exhibits no edema.  Neurological: She is alert and oriented to person, place, and time. Coordination abnormal.  Walker to walk  Skin: Skin is warm and dry.  Psychiatric: She has a normal mood and affect.   Vitals:   01/02/16 1430  BP: 130/66  Pulse: 96  Resp: 14  Temp: 98.5 F (36.9 C)  TempSrc: Oral  SpO2: 96%  Weight: 212 lb (96.2 kg)  Height: 5' 4.5" (1.638 m)      Assessment & Plan:

## 2016-01-02 NOTE — Patient Instructions (Signed)
We are checking the labs today and will call you back with the results.   Health Maintenance, Female Introduction Adopting a healthy lifestyle and getting preventive care can go a long way to promote health and wellness. Talk with your health care provider about what schedule of regular examinations is right for you. This is a good chance for you to check in with your provider about disease prevention and staying healthy. In between checkups, there are plenty of things you can do on your own. Experts have done a lot of research about which lifestyle changes and preventive measures are most likely to keep you healthy. Ask your health care provider for more information. Weight and diet Eat a healthy diet  Be sure to include plenty of vegetables, fruits, low-fat dairy products, and lean protein.  Do not eat a lot of foods high in solid fats, added sugars, or salt.  Get regular exercise. This is one of the most important things you can do for your health.  Most adults should exercise for at least 150 minutes each week. The exercise should increase your heart rate and make you sweat (moderate-intensity exercise).  Most adults should also do strengthening exercises at least twice a week. This is in addition to the moderate-intensity exercise. Maintain a healthy weight  Body mass index (BMI) is a measurement that can be used to identify possible weight problems. It estimates body fat based on height and weight. Your health care provider can help determine your BMI and help you achieve or maintain a healthy weight.  For females 20 years of age and older:  A BMI below 18.5 is considered underweight.  A BMI of 18.5 to 24.9 is normal.  A BMI of 25 to 29.9 is considered overweight.  A BMI of 30 and above is considered obese. Watch levels of cholesterol and blood lipids  You should start having your blood tested for lipids and cholesterol at 68 years of age, then have this test every 5  years.  You may need to have your cholesterol levels checked more often if:  Your lipid or cholesterol levels are high.  You are older than 68 years of age.  You are at high risk for heart disease. Cancer screening Lung Cancer  Lung cancer screening is recommended for adults 55-80 years old who are at high risk for lung cancer because of a history of smoking.  A yearly low-dose CT scan of the lungs is recommended for people who:  Currently smoke.  Have quit within the past 15 years.  Have at least a 30-pack-year history of smoking. A pack year is smoking an average of one pack of cigarettes a day for 1 year.  Yearly screening should continue until it has been 15 years since you quit.  Yearly screening should stop if you develop a health problem that would prevent you from having lung cancer treatment. Breast Cancer  Practice breast self-awareness. This means understanding how your breasts normally appear and feel.  It also means doing regular breast self-exams. Let your health care provider know about any changes, no matter how small.  If you are in your 20s or 30s, you should have a clinical breast exam (CBE) by a health care provider every 1-3 years as part of a regular health exam.  If you are 40 or older, have a CBE every year. Also consider having a breast X-ray (mammogram) every year.  If you have a family history of breast cancer, talk to your   health care provider about genetic screening.  If you are at high risk for breast cancer, talk to your health care provider about having an MRI and a mammogram every year.  Breast cancer gene (BRCA) assessment is recommended for women who have family members with BRCA-related cancers. BRCA-related cancers include:  Breast.  Ovarian.  Tubal.  Peritoneal cancers.  Results of the assessment will determine the need for genetic counseling and BRCA1 and BRCA2 testing. Cervical Cancer  Your health care provider may recommend  that you be screened regularly for cancer of the pelvic organs (ovaries, uterus, and vagina). This screening involves a pelvic examination, including checking for microscopic changes to the surface of your cervix (Pap test). You may be encouraged to have this screening done every 3 years, beginning at age 21.  For women ages 30-65, health care providers may recommend pelvic exams and Pap testing every 3 years, or they may recommend the Pap and pelvic exam, combined with testing for human papilloma virus (HPV), every 5 years. Some types of HPV increase your risk of cervical cancer. Testing for HPV may also be done on women of any age with unclear Pap test results.  Other health care providers may not recommend any screening for nonpregnant women who are considered low risk for pelvic cancer and who do not have symptoms. Ask your health care provider if a screening pelvic exam is right for you.  If you have had past treatment for cervical cancer or a condition that could lead to cancer, you need Pap tests and screening for cancer for at least 20 years after your treatment. If Pap tests have been discontinued, your risk factors (such as having a new sexual partner) need to be reassessed to determine if screening should resume. Some women have medical problems that increase the chance of getting cervical cancer. In these cases, your health care provider may recommend more frequent screening and Pap tests. Colorectal Cancer  This type of cancer can be detected and often prevented.  Routine colorectal cancer screening usually begins at 68 years of age and continues through 68 years of age.  Your health care provider may recommend screening at an earlier age if you have risk factors for colon cancer.  Your health care provider may also recommend using home test kits to check for hidden blood in the stool.  A small camera at the end of a tube can be used to examine your colon directly (sigmoidoscopy or  colonoscopy). This is done to check for the earliest forms of colorectal cancer.  Routine screening usually begins at age 50.  Direct examination of the colon should be repeated every 5-10 years through 68 years of age. However, you may need to be screened more often if early forms of precancerous polyps or small growths are found. Skin Cancer  Check your skin from head to toe regularly.  Tell your health care provider about any new moles or changes in moles, especially if there is a change in a mole's shape or color.  Also tell your health care provider if you have a mole that is larger than the size of a pencil eraser.  Always use sunscreen. Apply sunscreen liberally and repeatedly throughout the day.  Protect yourself by wearing long sleeves, pants, a wide-brimmed hat, and sunglasses whenever you are outside. Heart disease, diabetes, and high blood pressure  High blood pressure causes heart disease and increases the risk of stroke. High blood pressure is more likely to develop in:    People who have blood pressure in the high end of the normal range (130-139/85-89 mm Hg).  People who are overweight or obese.  People who are African American.  If you are 18-39 years of age, have your blood pressure checked every 3-5 years. If you are 40 years of age or older, have your blood pressure checked every year. You should have your blood pressure measured twice-once when you are at a hospital or clinic, and once when you are not at a hospital or clinic. Record the average of the two measurements. To check your blood pressure when you are not at a hospital or clinic, you can use:  An automated blood pressure machine at a pharmacy.  A home blood pressure monitor.  If you are between 55 years and 79 years old, ask your health care provider if you should take aspirin to prevent strokes.  Have regular diabetes screenings. This involves taking a blood sample to check your fasting blood sugar  level.  If you are at a normal weight and have a low risk for diabetes, have this test once every three years after 68 years of age.  If you are overweight and have a high risk for diabetes, consider being tested at a younger age or more often. Preventing infection Hepatitis B  If you have a higher risk for hepatitis B, you should be screened for this virus. You are considered at high risk for hepatitis B if:  You were born in a country where hepatitis B is common. Ask your health care provider which countries are considered high risk.  Your parents were born in a high-risk country, and you have not been immunized against hepatitis B (hepatitis B vaccine).  You have HIV or AIDS.  You use needles to inject street drugs.  You live with someone who has hepatitis B.  You have had sex with someone who has hepatitis B.  You get hemodialysis treatment.  You take certain medicines for conditions, including cancer, organ transplantation, and autoimmune conditions. Hepatitis C  Blood testing is recommended for:  Everyone born from 1945 through 1965.  Anyone with known risk factors for hepatitis C. Sexually transmitted infections (STIs)  You should be screened for sexually transmitted infections (STIs) including gonorrhea and chlamydia if:  You are sexually active and are younger than 68 years of age.  You are older than 68 years of age and your health care provider tells you that you are at risk for this type of infection.  Your sexual activity has changed since you were last screened and you are at an increased risk for chlamydia or gonorrhea. Ask your health care provider if you are at risk.  If you do not have HIV, but are at risk, it may be recommended that you take a prescription medicine daily to prevent HIV infection. This is called pre-exposure prophylaxis (PrEP). You are considered at risk if:  You are sexually active and do not regularly use condoms or know the HIV status  of your partner(s).  You take drugs by injection.  You are sexually active with a partner who has HIV. Talk with your health care provider about whether you are at high risk of being infected with HIV. If you choose to begin PrEP, you should first be tested for HIV. You should then be tested every 3 months for as long as you are taking PrEP. Pregnancy  If you are premenopausal and you may become pregnant, ask your health care provider about   counseling.  If you may become pregnant, take 400 to 800 micrograms (mcg) of folic acid every day.  If you want to prevent pregnancy, talk to your health care provider about birth control (contraception). Osteoporosis and menopause  Osteoporosis is a disease in which the bones lose minerals and strength with aging. This can result in serious bone fractures. Your risk for osteoporosis can be identified using a bone density scan.  If you are 40 years of age or older, or if you are at risk for osteoporosis and fractures, ask your health care provider if you should be screened.  Ask your health care provider whether you should take a calcium or vitamin D supplement to lower your risk for osteoporosis.  Menopause may have certain physical symptoms and risks.  Hormone replacement therapy may reduce some of these symptoms and risks. Talk to your health care provider about whether hormone replacement therapy is right for you. Follow these instructions at home:  Schedule regular health, dental, and eye exams.  Stay current with your immunizations.  Do not use any tobacco products including cigarettes, chewing tobacco, or electronic cigarettes.  If you are pregnant, do not drink alcohol.  If you are breastfeeding, limit how much and how often you drink alcohol.  Limit alcohol intake to no more than 1 drink per day for nonpregnant women. One drink equals 12 ounces of beer, 5 ounces of wine, or 1 ounces of hard liquor.  Do not use street  drugs.  Do not share needles.  Ask your health care provider for help if you need support or information about quitting drugs.  Tell your health care provider if you often feel depressed.  Tell your health care provider if you have ever been abused or do not feel safe at home. This information is not intended to replace advice given to you by your health care provider. Make sure you discuss any questions you have with your health care provider. Document Released: 07/20/2010 Document Revised: 06/12/2015 Document Reviewed: 10/08/2014  2017 [employer]vier

## 2016-01-02 NOTE — Assessment & Plan Note (Signed)
Patient declines any immunizations including pneumonia and flu. She is up to date on mammogram and colonoscopy. Dexa scan up to date. Counseled about sun safety and dangers of distracted driving. Counseled about home safety. Given screening recommendations.

## 2016-01-02 NOTE — Progress Notes (Signed)
Pre visit review using our clinic review tool, if applicable. No additional management support is needed unless otherwise documented below in the visit note. 

## 2016-01-02 NOTE — Assessment & Plan Note (Signed)
Checking lipid panel and adjust her statin lipitor as needed for LDL <100 goal.

## 2016-01-02 NOTE — Assessment & Plan Note (Signed)
Stable without flare today, mild persistent. Taking advair, albuterol prn. Not using lately.

## 2016-01-02 NOTE — Assessment & Plan Note (Signed)
Foot exam done today, reminded about eye exam. Checking labs. Brought sugar log which appears well controlled fasting. Taking metformin/saxagliptin, losartan and statin. Complicated by neuropathy which is stable with gabapentin.

## 2016-01-05 ENCOUNTER — Ambulatory Visit (INDEPENDENT_AMBULATORY_CARE_PROVIDER_SITE_OTHER): Payer: Medicare Other | Admitting: Internal Medicine

## 2016-01-05 ENCOUNTER — Encounter: Payer: Self-pay | Admitting: Internal Medicine

## 2016-01-05 VITALS — BP 110/70 | HR 105 | Ht 64.0 in | Wt 211.2 lb

## 2016-01-05 DIAGNOSIS — R55 Syncope and collapse: Secondary | ICD-10-CM

## 2016-01-05 DIAGNOSIS — I10 Essential (primary) hypertension: Secondary | ICD-10-CM

## 2016-01-05 LAB — CUP PACEART INCLINIC DEVICE CHECK
Implantable Pulse Generator Implant Date: 20150316
MDC IDC SESS DTM: 20171218172408

## 2016-01-05 MED ORDER — AZITHROMYCIN 250 MG PO TABS: ORAL_TABLET | ORAL | 0 refills | Status: DC

## 2016-01-05 NOTE — Patient Instructions (Addendum)
Medication Instructions:  Your physician has recommended you make the following change in your medication:  1) START Z-Pac - 2 tablets first day, after that, 1 tablet a day for 4 days     Labwork: None Ordered   Testing/Procedures: None Ordered   Follow-Up: Your physician wants you to follow-up in: 6 months with Dr. Lovena Le. You will receive a reminder letter in the mail two months in advance. If you don't receive a letter, please call our office to schedule the follow-up appointment.     Any Other Special Instructions Will Be Listed Below (If Applicable).     If you need a refill on your cardiac medications before your next appointment, please call your pharmacy.

## 2016-01-05 NOTE — Progress Notes (Signed)
HPI Mrs. Fergerson is a 70 yo woman with a h/o probable autonomic dysfunction, HTN, probable coronary spasm, and dyslipidemia.  She was unable to walk and underwent stress perfusion imaging which was unremarkable. She also wore a cardiac monitor which demonstrated PACs and PVCs but no sustained arrhythmias. She has a h/o non-cardiac chest pain. Several months ago she underwent insertion of an ILR due to unexplained syncope. She admits to being very sedentary because of back pain. She has undergone knee replacement surgery. No additional syncope. Her main complaint today is chest pain and sob associated with cough with productive sputum. Allergies  Allergen Reactions  . Contrast Media [Iodinated Diagnostic Agents] Anaphylaxis  . Gadolinium Anaphylaxis       . Aspirin Nausea And Vomiting    Fast heart rate  . Iohexol Other (See Comments)    unknown  . Trazodone And Nefazodone     Dizziness,lightheadness, heart felt funny     Current Outpatient Prescriptions  Medication Sig Dispense Refill  . ADVAIR HFA 115-21 MCG/ACT inhaler INHALE 2 PUFFS INTO THE LUNGS TWO TIMES DAILY. 12 g 4  . Alcohol Swabs (ALCOHOL PADS) 70 % PADS Use to help clean site to check blood sugars daily Dx E11.9 100 each 3  . amitriptyline (ELAVIL) 50 MG tablet Take one table by mouth every night at bedtime. 90 tablet 3  . atorvastatin (LIPITOR) 40 MG tablet Take 1 tablet (40 mg total) by mouth daily. 90 tablet 3  . cholecalciferol (VITAMIN D) 1000 UNITS tablet Take 1,000 Units by mouth daily.    . clopidogrel (PLAVIX) 75 MG tablet TAKE ONE TABLET BY MOUTH DAILY. 90 tablet 0  . cycloSPORINE (RESTASIS) 0.05 % ophthalmic emulsion Place 1 drop into both eyes 2 (two) times daily.     Marland Kitchen DEXILANT 60 MG capsule TAKE 1 CAPSULE BY MOUTH ONCE DAILY 90 capsule 2  . diazepam (VALIUM) 5 MG tablet TAKE 1 TABLET BY MOUTH TWICE DAILY FOR MUSCLE SPASMS 60 tablet 0  . diltiazem (CARDIZEM CD) 180 MG 24 hr capsule TAKE 1 CAPSULE BY MOUTH  ONCE DAILY 90 capsule 2  . FIBER SELECT GUMMIES PO Take 2 capsules by mouth daily.    Marland Kitchen gabapentin (NEURONTIN) 300 MG capsule Take 2 capsules of 300 mg each at night by mouth between one and 2 hours prior to bedtime. You can take another 300 mg in daytime for pain. 270 capsule 3  . glucose blood (ONE TOUCH ULTRA TEST) test strip 1 each by Other route 2 (two) times daily. Use to check blood sugars twice a day Dx E11.9 100 each 3  . ipratropium (ATROVENT) 0.06 % nasal spray INSTILL 2 SPRAYS IN EACH NOSTRIL FOUR TIMES A DAY 45 mL 1  . isosorbide mononitrate (IMDUR) 60 MG 24 hr tablet TAKE 1 TABLET BY MOUTH ONCE DAILY 90 tablet 1  . Lancets (ONETOUCH ULTRASOFT) lancets 1 each by Other route 2 (two) times daily. Use to help check blood sugars twice a day Dx E11.9 100 each 3  . losartan (COZAAR) 100 MG tablet TAKE 1 TABLET BY MOUTH EVERY MORNING 90 tablet 1  . methocarbamol (ROBAXIN) 500 MG tablet Take 1 tablet (500 mg total) by mouth every 6 (six) hours as needed for muscle spasms. 60 tablet 0  . metoCLOPramide (REGLAN) 10 MG tablet Take 1 tablet (10 mg total) by mouth 4 (four) times daily -  before meals and at bedtime. 120 tablet 3  . nitroGLYCERIN (NITROLINGUAL) 0.4 MG/SPRAY  spray Place 1 spray under the tongue every 5 (five) minutes as needed. Chest pains 12 g 1  . olopatadine (PATANOL) 0.1 % ophthalmic solution Insert 1 drop in both eyes twice daily  6  . PROAIR HFA 108 (90 Base) MCG/ACT inhaler INHALE 2 PUFFS BY MOUTH EVERY 6 HOURS FOR WHEEZING AND SHORTNESS OF BREATH 17 each 1  . Saxagliptin-Metformin 05-998 MG TB24 Take 1 tablet by mouth daily. Take one tablet every evening with a meal. 90 tablet 3  . topiramate (TOPAMAX) 100 MG tablet TAKE 1 TABLET BY MOUTH EVERY EVENING 30 tablet 1  . topiramate (TOPAMAX) 25 MG tablet TAKE 1 TABLET BY MOUTH EVERY MORNING 90 tablet 2   No current facility-administered medications for this visit.      Past Medical History:  Diagnosis Date  . Asthma   .  Benign neoplasm of colon 11/09/09   Colonoscopy 11/09/09 - FINDINGS: 1) 40mm polp in transverse colon, resected & retrieved.  2) Diverticulosis in sigmoid & descending colon.  3) Medium-sized, internal hemorrhoids.  . Breast cancer ([place]) 1989   Left breast cancer  . Chronic kidney disease    states she has a cyst on her kidney  . CMC arthritis    Left middle finger  . Depression    40+ years  . Diabetes mellitus 10/2008   type 2  . Diverticulosis of colon (without mention of hemorrhage)    Colonoscopy 11/09/09 - FINDINGS: 1) 98mm polp in transverse colon, resected & retrieved.  2) Diverticulosis in sigmoid & descending colon.  3) Medium-sized, internal hemorrhoids.  . Esophagitis   . Essential hypertension, benign   . Facet arthropathy, lumbar    L3-4  . GERD (gastroesophageal reflux disease)   . Hemorrhage of rectum and anus    Colonoscopy 11/09/09 - FINDINGS: 1) 79mm polp in transverse colon, resected & retrieved.  2) Diverticulosis in sigmoid & descending colon.  3) Medium-sized, internal hemorrhoids.  . Hiatal hernia   . Hypercholesteremia   . Internal hemorrhoids without mention of complication    Colonoscopy 11/09/09 - FINDINGS: 1) 27mm polp in transverse colon, resected & retrieved.  2) Diverticulosis in sigmoid & descending colon.  3) Medium-sized, internal hemorrhoids.  . Myocardial infarction [place] W/Spect W/Wall Motion EF on 08/07/10 - IMPRESSION: 1) No evidence of ischemia.  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  3) Normal LV wall motion.  4) Estimated Q G S EF 70%, unchanged.  . Obese   . Obstructive sleep apnea    uses oxygen at night  . Paroxysmal atrial fibrillation (HCC)    a identified on LINQ  . Spondylolisthesis   . Syncope    MDT LinQ implanted for evaluation    ROS:   All systems reviewed and negative except as noted in the HPI.   Past Surgical History:  Procedure Laterality Date  .  ABDOMINAL HYSTERECTOMY  1978  . BACK SURGERY     x2 one for infection  . BREAST BIOPSY Left 1995   Biopsy at [facility] (BENIGN)  . CHOLECYSTECTOMY    . COLONOSCOPY    . COLONOSCOPY W/ POLYPECTOMY  11/09/09   FINDINGS: 1) 67mm polp in transverse colon, resected & retrieved.  2) Diverticulosis in sigmoid & descending colon.  3) Medium-sized, internal hemorrhoids.  Marland Kitchen DILATION AND CURETTAGE OF UTERUS    . ESOPHAGOGASTRODUODENOSCOPY  02/15/07  . FOOT SURGERY Bilateral  Bil feet great toes and second toes spurs. By Dr. Milinda Pointer in 2004.  Marland Kitchen HEMORRHOID SURGERY  2011   Internal & external hemorrhoidectomy (x) 3 quadrants  . JOINT REPLACEMENT     lsft hip x2, rt knee  . KNEE ARTHROPLASTY Left 11/27/2014   Procedure: LEFT TOTAL KNEE ARTHROPLASTY;  Surgeon: Marybelle Killings, MD;  Location: [place];  Service: Orthopedics;  Laterality: Left;  . KNEE ARTHROSCOPY Right 2002   By Dr. Lorin Mercy  . LOOP RECORDER IMPLANT  11/15/06; 04/02/2013   MDT Reveal implanted 10/2006 for syncope; removed with MDT LinQ implanted 03/2013 by Dr Lovena Le  . LOOP RECORDER IMPLANT N/A 04/02/2013   Procedure: LOOP RECORDER IMPLANT;  Surgeon: Evans Lance, MD;  Location: [facility] CATH LAB;  Service: Cardiovascular;  Laterality: N/A;  . TOE SURGERY    . TONSILLECTOMY    . TOTAL HIP REVISION  12/14/2010   Procedure: TOTAL HIP REVISION;  Surgeon: Marybelle Killings;  Location: [place];  Service: Orthopedics;  Laterality: Left;  Left Total Hip Arthroplasty Revision, Poly and Ball Exchange, Possible Acetabular Revision vs. Poly Exchange  . TOTAL KNEE ARTHROPLASTY Left 11/27/2014  . TRIGGER FINGER RELEASE Left 01/02/2013   Procedure: RELEASE LEFT MIDDLE TRIGGER FINGER/LEFT INDEX AND LEFT RING FINGER;  Surgeon: Wynonia Sours, MD;  Location: [place];  Service: Orthopedics;  Laterality: Left;  ANESTHESIA: GENERAL/IV REGIONAL FAB     Family History  Problem Relation Age of Onset  . Pancreatic cancer Mother   . Heart attack  Father   . Diabetes Son   . Arthritis Brother     Severe/Crippling  . Tuberculosis Brother   . Hypertension Brother     x 2  . Heart disease Sister   . Diabetes Brother   . Seizures Brother   . Autoimmune disease Brother     AIDS  . Colon cancer Brother   . Lung cancer Brother     mets     Social History   Social History  . Marital status: Single    Spouse name: N/A  . Number of children: 2  . Years of education: college   Occupational History  . Retired    Social History Main Topics  . Smoking status: Former Smoker    Types: Cigarettes    Quit date: 01/19/1983  . Smokeless tobacco: Never Used  . Alcohol use No  . Drug use: No  . Sexual activity: Not on file   Other Topics Concern  . Not on file   Social History Narrative   Patient is single and lives alone.   Patient has two children.   Patient is retired.   Patient has a college education.   Patient is right-handed.   Patient does not drink any caffeine.     BP 110/70   Pulse (!) 105   Ht 5\' 4"  (1.626 m)   Wt 211 lb 3.2 oz (95.8 kg)   SpO2 98%   BMI 36.25 kg/m   Physical Exam:  obese appearing 70 year old woman, NAD HEENT: Unremarkable Neck:  No JVD, no thyromegally Back:  No CVA tenderness Lungs:  Clear with no wheezes, rales, or rhonchi. HEART:  Regular rate rhythm, no murmurs, no rubs, no clicks Abd:  soft, positive bowel sounds, no organomegally, no rebound, no guarding Ext:  2 plus pulses, no edema, no cyanosis, no clubbing Skin:  No rashes no nodules Neuro:  CN II through XII intact, motor grossly intact  ILR interogation demonstrated  no sustained SVT, atrial fib, VT, or bradycardic episodes. She did have undersensing.   Assess/Plan: 1. Cough with associated sob and chest pain - cardiac eval has been negative. I have recommended she start a Z - pack to treat what sounds like bronchitis. 2. Syncope - she has had no recurrent episodes. 3. HTN - her blood pressure is well controlled.    Mikle Bosworth.D.

## 2016-01-14 LAB — CUP PACEART REMOTE DEVICE CHECK
Date Time Interrogation Session: 20171113033813
MDC IDC PG IMPLANT DT: 20150316

## 2016-01-16 ENCOUNTER — Ambulatory Visit (INDEPENDENT_AMBULATORY_CARE_PROVIDER_SITE_OTHER): Payer: Medicare Other | Admitting: Orthopaedic Surgery

## 2016-01-21 ENCOUNTER — Other Ambulatory Visit: Payer: Self-pay | Admitting: Internal Medicine

## 2016-01-28 ENCOUNTER — Other Ambulatory Visit: Payer: Self-pay | Admitting: Internal Medicine

## 2016-01-28 DIAGNOSIS — H2513 Age-related nuclear cataract, bilateral: Secondary | ICD-10-CM | POA: Diagnosis not present

## 2016-01-28 DIAGNOSIS — H16223 Keratoconjunctivitis sicca, not specified as Sjogren's, bilateral: Secondary | ICD-10-CM | POA: Diagnosis not present

## 2016-01-28 DIAGNOSIS — H40023 Open angle with borderline findings, high risk, bilateral: Secondary | ICD-10-CM | POA: Diagnosis not present

## 2016-01-29 ENCOUNTER — Ambulatory Visit (INDEPENDENT_AMBULATORY_CARE_PROVIDER_SITE_OTHER): Payer: Medicare Other | Admitting: *Deleted

## 2016-01-29 DIAGNOSIS — R55 Syncope and collapse: Secondary | ICD-10-CM

## 2016-01-29 NOTE — Telephone Encounter (Signed)
Wasn't sure if I am supposed to refill the one that says something about being discontinued

## 2016-01-30 NOTE — Progress Notes (Signed)
Carelink Summary Report / Loop Recorder 

## 2016-02-02 DIAGNOSIS — J45998 Other asthma: Secondary | ICD-10-CM | POA: Diagnosis not present

## 2016-02-06 ENCOUNTER — Telehealth: Payer: Self-pay | Admitting: *Deleted

## 2016-02-06 NOTE — Telephone Encounter (Signed)
LMOVM for patient's son, Anistynn Nishio [facility]).  Requested call back to [facility].  LINQ at RRT as of 01/24/16, will determine if patient wishes to schedule appointment to discuss explant.

## 2016-02-06 NOTE — Telephone Encounter (Signed)
Pt called and stated that she wanted to see MD to discuss options about loop recorder. Appointment scheduled for 03-22-16 at 12:001 PM. Pt aware that it is ok to leave loop recorder in place after reaching RRT.

## 2016-02-06 NOTE — Telephone Encounter (Signed)
Spoke w/ pt son about pt loop recorder reaching RRT. Hs stated that he would talk w/ pt and call back.

## 2016-02-11 LAB — CUP PACEART REMOTE DEVICE CHECK
Date Time Interrogation Session: 20171213043955
Date Time Interrogation Session: 20180112050911
Implantable Pulse Generator Implant Date: 20150316
MDC IDC PG IMPLANT DT: 20150316

## 2016-02-16 ENCOUNTER — Other Ambulatory Visit: Payer: Self-pay | Admitting: Internal Medicine

## 2016-02-16 DIAGNOSIS — Z1231 Encounter for screening mammogram for malignant neoplasm of breast: Secondary | ICD-10-CM

## 2016-02-18 ENCOUNTER — Ambulatory Visit (INDEPENDENT_AMBULATORY_CARE_PROVIDER_SITE_OTHER): Payer: Self-pay | Admitting: Podiatry

## 2016-02-18 DIAGNOSIS — M79676 Pain in unspecified toe(s): Secondary | ICD-10-CM

## 2016-02-18 DIAGNOSIS — B351 Tinea unguium: Secondary | ICD-10-CM | POA: Diagnosis not present

## 2016-02-19 NOTE — Progress Notes (Signed)
Subjective:     Patient ID: [name], female   DOB: December 04, 1945, 71 y.o.   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

## 2016-02-25 ENCOUNTER — Other Ambulatory Visit: Payer: Self-pay | Admitting: Internal Medicine

## 2016-03-04 DIAGNOSIS — J45998 Other asthma: Secondary | ICD-10-CM | POA: Diagnosis not present

## 2016-03-11 ENCOUNTER — Ambulatory Visit
Admission: RE | Admit: 2016-03-11 | Discharge: 2016-03-11 | Disposition: A | Discharge status: Home / Self Care | Payer: Medicare Other | Source: Ambulatory Visit | Attending: Internal Medicine | Admitting: Internal Medicine

## 2016-03-11 DIAGNOSIS — Z1231 Encounter for screening mammogram for malignant neoplasm of breast: Secondary | ICD-10-CM | POA: Diagnosis not present

## 2016-03-12 ENCOUNTER — Encounter: Payer: Self-pay | Admitting: *Deleted

## 2016-03-16 ENCOUNTER — Other Ambulatory Visit: Payer: Self-pay | Admitting: Internal Medicine

## 2016-03-16 DIAGNOSIS — R928 Other abnormal and inconclusive findings on diagnostic imaging of breast: Secondary | ICD-10-CM

## 2016-03-18 ENCOUNTER — Telehealth: Payer: Self-pay | Admitting: Internal Medicine

## 2016-03-22 ENCOUNTER — Telehealth: Payer: Self-pay

## 2016-03-22 ENCOUNTER — Ambulatory Visit (INDEPENDENT_AMBULATORY_CARE_PROVIDER_SITE_OTHER): Payer: Medicare Other | Admitting: Internal Medicine

## 2016-03-22 ENCOUNTER — Encounter: Payer: Self-pay | Admitting: Internal Medicine

## 2016-03-22 VITALS — BP 122/70 | HR 89 | Ht 64.0 in | Wt 212.6 lb

## 2016-03-22 DIAGNOSIS — R55 Syncope and collapse: Secondary | ICD-10-CM | POA: Diagnosis not present

## 2016-03-22 LAB — CUP PACEART INCLINIC DEVICE CHECK
MDC IDC PG IMPLANT DT: 20150316
MDC IDC SESS DTM: 20180305172756

## 2016-03-22 NOTE — Progress Notes (Signed)
    HPI [name] is a 70 yo woman with a h/o probable autonomic dysfunction, HTN, probable coronary spasm, and dyslipidemia.  She underwent insertion of an ILR due to unexplained syncope about 3 years ago and has reached ERI. She admits to being very sedentary because of back pain. She has undergone knee replacement surgery. No additional syncope. She is interested in having his ILR removed.  Allergies  Allergen Reactions  . Contrast Media [Iodinated Diagnostic Agents] Anaphylaxis  . Gadolinium Anaphylaxis       . Aspirin Nausea And Vomiting    Fast heart rate  . Iohexol Other (See Comments)    unknown  . Trazodone And Nefazodone     Dizziness,lightheadness, heart felt funny     Current Outpatient Prescriptions  Medication Sig Dispense Refill  . ADVAIR HFA 115-21 MCG/ACT inhaler INHALE 2 PUFFS INTO THE LUNGS TWO TIMES DAILY. 12 g 4  . Alcohol Swabs (ALCOHOL PADS) 70 % PADS Use to help clean site to check blood sugars daily Dx E11.9 100 each 3  . amitriptyline (ELAVIL) 50 MG tablet Take one table by mouth every night at bedtime. 90 tablet 3  . atorvastatin (LIPITOR) 40 MG tablet TAKE 1 TABLET BY MOUTH DAILY 90 tablet 2  . azithromycin (ZITHROMAX Z-PAK) 250 MG tablet Take 2 tablets first day and then 1 tablet a day until finished 6 each 0  . cholecalciferol (VITAMIN D) 1000 UNITS tablet Take 1,000 Units by mouth daily.    . clopidogrel (PLAVIX) 75 MG tablet TAKE ONE TABLET BY MOUTH DAILY. 90 tablet 0  . cycloSPORINE (RESTASIS) 0.05 % ophthalmic emulsion Place 1 drop into both eyes 2 (two) times daily.     . DEXILANT 60 MG capsule TAKE 1 CAPSULE BY MOUTH ONCE DAILY 90 capsule 2  . diazepam (VALIUM) 5 MG tablet TAKE 1 TABLET BY MOUTH TWICE DAILY FOR MUSCLE SPASM. 60 tablet 0  . diltiazem (CARDIZEM CD) 180 MG 24 hr capsule TAKE 1 CAPSULE BY MOUTH ONCE DAILY 90 capsule 2  . FIBER SELECT GUMMIES PO Take 2 capsules by mouth daily.    . gabapentin (NEURONTIN) 300 MG capsule Take 2  capsules of 300 mg each at night by mouth between one and 2 hours prior to bedtime. You can take another 300 mg in daytime for pain. 270 capsule 3  . glucose blood (ONE TOUCH ULTRA TEST) test strip 1 each by Other route 2 (two) times daily. Use to check blood sugars twice a day Dx E11.9 100 each 3  . ipratropium (ATROVENT) 0.06 % nasal spray INSTILL 2 SPRAYS IN EACH NOSTRIL FOUR TIMES A DAY 45 mL 1  . isosorbide mononitrate (IMDUR) 60 MG 24 hr tablet TAKE 1 TABLET BY MOUTH ONCE DAILY 90 tablet 3  . KOMBIGLYZE XR 05-998 MG TB24 TAKE 1 TABLET BY MOUTH EVERY EVENING WITH A MEAL 90 tablet 2  . Lancets (ONETOUCH ULTRASOFT) lancets 1 each by Other route 2 (two) times daily. Use to help check blood sugars twice a day Dx E11.9 100 each 3  . losartan (COZAAR) 100 MG tablet TAKE 1 TABLET BY MOUTH EVERY MORNING 90 tablet 3  . methocarbamol (ROBAXIN) 500 MG tablet Take 1 tablet (500 mg total) by mouth every 6 (six) hours as needed for muscle spasms. 60 tablet 0  . metoCLOPramide (REGLAN) 10 MG tablet Take 1 tablet (10 mg total) by mouth 4 (four) times daily -  before meals and at bedtime. 120 tablet 3  .   metoCLOPramide (REGLAN) 10 MG tablet TAKE 1 TABLET BY MOUTH 3 TIMES A DAY BEFORE MEALS 90 tablet 1  . nitroGLYCERIN (NITROLINGUAL) 0.4 MG/SPRAY spray Place 1 spray under the tongue every 5 (five) minutes as needed. Chest pains 12 g 1  . olopatadine (PATANOL) 0.1 % ophthalmic solution Insert 1 drop in both eyes twice daily  6  . PROAIR HFA 108 (90 Base) MCG/ACT inhaler INHALE 2 PUFFS BY MOUTH EVERY 6 HOURS FOR WHEEZING AND SHORTNESS OF BREATH 17 each 1  . topiramate (TOPAMAX) 100 MG tablet TAKE 1 TABLET BY MOUTH EVERY EVENING 30 tablet 1  . topiramate (TOPAMAX) 25 MG tablet TAKE 1 TABLET BY MOUTH EVERY MORNING 90 tablet 2   No current facility-administered medications for this visit.      Past Medical History:  Diagnosis Date  . Asthma   . Benign neoplasm of colon 11/09/09   Colonoscopy 11/09/09 -  FINDINGS: 1) 10mm polp in transverse colon, resected & retrieved.  2) Diverticulosis in sigmoid & descending colon.  3) Medium-sized, internal hemorrhoids.  . Breast cancer (HCC) 1989   Left breast cancer  . Chronic kidney disease    states she has a cyst on her kidney  . CMC arthritis    Left middle finger  . Depression    40+ years  . Diabetes mellitus 10/2008   type 2  . Diverticulosis of colon (without mention of hemorrhage)    Colonoscopy 11/09/09 - FINDINGS: 1) 10mm polp in transverse colon, resected & retrieved.  2) Diverticulosis in sigmoid & descending colon.  3) Medium-sized, internal hemorrhoids.  . Esophagitis   . Essential hypertension, benign   . Facet arthropathy, lumbar    L3-4  . GERD (gastroesophageal reflux disease)   . Hemorrhage of rectum and anus    Colonoscopy 11/09/09 - FINDINGS: 1) 10mm polp in transverse colon, resected & retrieved.  2) Diverticulosis in sigmoid & descending colon.  3) Medium-sized, internal hemorrhoids.  . Hiatal hernia   . Hypercholesteremia   . Internal hemorrhoids without mention of complication    Colonoscopy 11/09/09 - FINDINGS: 1) 10mm polp in transverse colon, resected & retrieved.  2) Diverticulosis in sigmoid & descending colon.  3) Medium-sized, internal hemorrhoids.  . Myocardial infarction 1992 & 1998   NM Myocar Multi W/Spect W/Wall Motion EF on 08/07/10 - IMPRESSION: 1) No evidence of ischemia.  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  3) Normal LV wall motion.  4) Estimated Q G S EF 70%, unchanged.  . Obese   . Obstructive sleep apnea    uses oxygen at night  . Paroxysmal atrial fibrillation (HCC)    a identified on LINQ  . Spondylolisthesis   . Syncope    MDT LinQ implanted for evaluation    ROS:   All systems reviewed and negative except as noted in the HPI.   Past Surgical History:  Procedure Laterality Date  . ABDOMINAL HYSTERECTOMY  1978  . BACK SURGERY     x2 one  for infection  . BREAST BIOPSY Left 1995   Biopsy at [facility] (BENIGN)  . BREAST EXCISIONAL BIOPSY Left 1997  . CHOLECYSTECTOMY    . COLONOSCOPY    . COLONOSCOPY W/ POLYPECTOMY  11/09/09   FINDINGS: 1) 10mm polp in transverse colon, resected & retrieved.  2) Diverticulosis in sigmoid & descending colon.  3) Medium-sized, internal hemorrhoids.  . DILATION AND CURETTAGE OF UTERUS    . ESOPHAGOGASTRODUODENOSCOPY    02/15/07  . FOOT SURGERY Bilateral    Bil feet great toes and second toes spurs. By Dr. Hyatt in 2004.  . HEMORRHOID SURGERY  2011   Internal & external hemorrhoidectomy (x) 3 quadrants  . JOINT REPLACEMENT     lsft hip x2, rt knee  . KNEE ARTHROPLASTY Left 11/27/2014   Procedure: LEFT TOTAL KNEE ARTHROPLASTY;  Surgeon: Mark C Yates, MD;  Location: [facility] OR;  Service: Orthopedics;  Laterality: Left;  . KNEE ARTHROSCOPY Right 2002   By Dr. Yates  . LOOP RECORDER IMPLANT  11/15/06; 04/02/2013   MDT Reveal implanted 10/2006 for syncope; removed with MDT LinQ implanted 03/2013 by Dr Taylor  . LOOP RECORDER IMPLANT N/A 04/02/2013   Procedure: LOOP RECORDER IMPLANT;  Surgeon: Gregg W Taylor, MD;  Location: [facility] CATH LAB;  Service: Cardiovascular;  Laterality: N/A;  . TOE SURGERY    . TONSILLECTOMY    . TOTAL HIP REVISION  12/14/2010   Procedure: TOTAL HIP REVISION;  Surgeon: Mark C Yates;  Location: [facility] OR;  Service: Orthopedics;  Laterality: Left;  Left Total Hip Arthroplasty Revision, Poly and Ball Exchange, Possible Acetabular Revision vs. Poly Exchange  . TOTAL KNEE ARTHROPLASTY Left 11/27/2014  . TRIGGER FINGER RELEASE Left 01/02/2013   Procedure: RELEASE LEFT MIDDLE TRIGGER FINGER/LEFT INDEX AND LEFT RING FINGER;  Surgeon: Gary R Kuzma, MD;  Location: [facility];  Service: Orthopedics;  Laterality: Left;  ANESTHESIA: GENERAL/IV REGIONAL FAB     Family History  Problem Relation Age of Onset  . Pancreatic cancer Mother   . Heart attack Father   .  Diabetes Son   . Arthritis Brother     Severe/Crippling  . Tuberculosis Brother   . Hypertension Brother     x 2  . Heart disease Sister   . Diabetes Brother   . Seizures Brother   . Autoimmune disease Brother     AIDS  . Colon cancer Brother   . Lung cancer Brother     mets     Social History   Social History  . Marital status: Single    Spouse name: N/A  . Number of children: 2  . Years of education: college   Occupational History  . Retired    Social History Main Topics  . Smoking status: Former Smoker    Types: Cigarettes    Quit date: 01/19/1983  . Smokeless tobacco: Never Used  . Alcohol use No  . Drug use: No  . Sexual activity: Not on file   Other Topics Concern  . Not on file   Social History Narrative   Patient is single and lives alone.   Patient has two children.   Patient is retired.   Patient has a college education.   Patient is right-handed.   Patient does not drink any caffeine.     BP 122/70   Pulse 89   Ht 5' 4" (1.626 m)   Wt 212 lb 9.6 oz (96.4 kg)   SpO2 98%   BMI 36.49 kg/m   Physical Exam:  obese appearing 70-year-old woman, NAD HEENT: Unremarkable Neck:  No JVD, no thyromegally Back:  No CVA tenderness Lungs:  Clear with no wheezes, rales, or rhonchi. HEART:  Regular rate rhythm, no murmurs, no rubs, no clicks Abd:  soft, positive bowel sounds, no organomegally, no rebound, no guarding Ext:  2 plus pulses, no edema, no cyanosis, no clubbing Skin:  No rashes no nodules Neuro:  CN II through XII   intact, motor grossly intact  ILR interogation demonstrated no sustained SVT, atrial fib, VT, or bradycardic episodes. She did have undersensing.   Assess/Plan: 1. Syncope - she has had no recurrent episodes. We will schedule her to have her ILR removed. 2. HTN - her blood pressure is well controlled.  3. Obesity - I have encouraged her to lose weight.   Gregg Taylor,M.D. 

## 2016-03-22 NOTE — Patient Instructions (Addendum)
Medication Instructions:  Your physician recommends that you continue on your current medications as directed. Please refer to the Current Medication list given to you today.   Labwork: None Ordered   Testing/Procedures: LINQ Removal   Follow-Up: Follow-up in the [facility] Clinic 7-10 days after removal.    Any Other Special Instructions Will Be Listed Below (If Applicable).  Please arrive to the [facility] on 04/05/16 at 8:00 AM  You may eat and drink normally  You may take your medication     If you need a refill on your cardiac medications before your next appointment, please call your pharmacy.

## 2016-03-22 NOTE — Telephone Encounter (Signed)
Pt did not receive return kit. I will order her another to send her monitor back to [facility]. She clarifies address for me.

## 2016-03-25 ENCOUNTER — Other Ambulatory Visit: Payer: Self-pay | Admitting: Family

## 2016-03-25 DIAGNOSIS — R928 Other abnormal and inconclusive findings on diagnostic imaging of breast: Secondary | ICD-10-CM

## 2016-03-26 ENCOUNTER — Other Ambulatory Visit: Payer: Medicare Other

## 2016-03-26 ENCOUNTER — Other Ambulatory Visit: Payer: Self-pay | Admitting: Neurology

## 2016-03-26 ENCOUNTER — Other Ambulatory Visit: Payer: Self-pay | Admitting: Internal Medicine

## 2016-03-30 ENCOUNTER — Ambulatory Visit
Admission: RE | Admit: 2016-03-30 | Discharge: 2016-03-30 | Disposition: A | Discharge status: Home / Self Care | Payer: Medicare Other | Source: Ambulatory Visit | Attending: Family | Admitting: Family

## 2016-03-30 DIAGNOSIS — R928 Other abnormal and inconclusive findings on diagnostic imaging of breast: Secondary | ICD-10-CM

## 2016-04-01 ENCOUNTER — Other Ambulatory Visit: Payer: Self-pay | Admitting: Internal Medicine

## 2016-04-01 DIAGNOSIS — J45998 Other asthma: Secondary | ICD-10-CM | POA: Diagnosis not present

## 2016-04-05 ENCOUNTER — Encounter (HOSPITAL_COMMUNITY)
Admission: RE | Disposition: A | Discharge status: Home / Self Care | Payer: Self-pay | Source: Ambulatory Visit | Attending: Internal Medicine

## 2016-04-05 ENCOUNTER — Encounter (HOSPITAL_COMMUNITY): Payer: Self-pay | Admitting: Internal Medicine

## 2016-04-05 ENCOUNTER — Ambulatory Visit (HOSPITAL_COMMUNITY)
Admission: RE | Admit: 2016-04-05 | Discharge: 2016-04-05 | Disposition: A | Discharge status: Home / Self Care | Payer: Medicare Other | Source: Ambulatory Visit | Attending: Internal Medicine | Admitting: Internal Medicine

## 2016-04-05 DIAGNOSIS — Z6836 Body mass index (BMI) 36.0-36.9, adult: Secondary | ICD-10-CM | POA: Insufficient documentation

## 2016-04-05 DIAGNOSIS — E1122 Type 2 diabetes mellitus with diabetic chronic kidney disease: Secondary | ICD-10-CM | POA: Insufficient documentation

## 2016-04-05 DIAGNOSIS — Z4509 Encounter for adjustment and management of other cardiac device: Secondary | ICD-10-CM | POA: Diagnosis not present

## 2016-04-05 DIAGNOSIS — I129 Hypertensive chronic kidney disease with stage 1 through stage 4 chronic kidney disease, or unspecified chronic kidney disease: Secondary | ICD-10-CM | POA: Diagnosis not present

## 2016-04-05 DIAGNOSIS — Z8249 Family history of ischemic heart disease and other diseases of the circulatory system: Secondary | ICD-10-CM | POA: Insufficient documentation

## 2016-04-05 DIAGNOSIS — Z87891 Personal history of nicotine dependence: Secondary | ICD-10-CM | POA: Insufficient documentation

## 2016-04-05 DIAGNOSIS — J45909 Unspecified asthma, uncomplicated: Secondary | ICD-10-CM | POA: Diagnosis not present

## 2016-04-05 DIAGNOSIS — E7800 Pure hypercholesterolemia, unspecified: Secondary | ICD-10-CM | POA: Diagnosis not present

## 2016-04-05 DIAGNOSIS — E669 Obesity, unspecified: Secondary | ICD-10-CM | POA: Insufficient documentation

## 2016-04-05 DIAGNOSIS — R55 Syncope and collapse: Secondary | ICD-10-CM | POA: Insufficient documentation

## 2016-04-05 DIAGNOSIS — K219 Gastro-esophageal reflux disease without esophagitis: Secondary | ICD-10-CM | POA: Insufficient documentation

## 2016-04-05 DIAGNOSIS — G4733 Obstructive sleep apnea (adult) (pediatric): Secondary | ICD-10-CM | POA: Diagnosis not present

## 2016-04-05 DIAGNOSIS — K449 Diaphragmatic hernia without obstruction or gangrene: Secondary | ICD-10-CM | POA: Insufficient documentation

## 2016-04-05 DIAGNOSIS — I480 Paroxysmal atrial fibrillation: Secondary | ICD-10-CM | POA: Diagnosis not present

## 2016-04-05 DIAGNOSIS — F329 Major depressive disorder, single episode, unspecified: Secondary | ICD-10-CM | POA: Diagnosis not present

## 2016-04-05 DIAGNOSIS — I252 Old myocardial infarction: Secondary | ICD-10-CM | POA: Diagnosis not present

## 2016-04-05 DIAGNOSIS — N189 Chronic kidney disease, unspecified: Secondary | ICD-10-CM | POA: Diagnosis not present

## 2016-04-05 DIAGNOSIS — Z9981 Dependence on supplemental oxygen: Secondary | ICD-10-CM | POA: Insufficient documentation

## 2016-04-05 DIAGNOSIS — Z833 Family history of diabetes mellitus: Secondary | ICD-10-CM | POA: Insufficient documentation

## 2016-04-05 HISTORY — PX: LOOP RECORDER REMOVAL: EP1215

## 2016-04-05 LAB — GLUCOSE, CAPILLARY: GLUCOSE-CAPILLARY: 73 mg/dL (ref 65–99)

## 2016-04-05 SURGERY — LOOP RECORDER REMOVAL

## 2016-04-05 MED ORDER — LIDOCAINE HCL (PF) 1 % IJ SOLN
INTRAMUSCULAR | Status: AC
  Filled 2016-04-05: qty 30

## 2016-04-05 MED ORDER — LIDOCAINE HCL (PF) 1 % IJ SOLN
INTRAMUSCULAR | Status: DC | PRN
  Administered 2016-04-05: 20 mL via INTRADERMAL
  Administered 2016-04-05: 70 mL via INTRADERMAL
  Administered 2016-04-05: 20 mL via INTRADERMAL

## 2016-04-05 MED ORDER — ONDANSETRON HCL 4 MG/2ML IJ SOLN: 4.0000 mg | Freq: Four times a day (QID) | INTRAMUSCULAR | Status: DC | PRN

## 2016-04-05 MED ORDER — ACETAMINOPHEN 325 MG PO TABS
325.0000 mg | ORAL_TABLET | ORAL | Status: DC | PRN
  Filled 2016-04-05: qty 2

## 2016-04-05 SURGICAL SUPPLY — 2 items
COVER DOME SNAP 22 D (MISCELLANEOUS) ×3 IMPLANT
PACK LOOP INSERTION (CUSTOM PROCEDURE TRAY) ×3 IMPLANT

## 2016-04-05 NOTE — H&P (View-Only) (Signed)
HPI [name] is a 71 yo woman with a h/o probable autonomic dysfunction, HTN, probable coronary spasm, and dyslipidemia.  She underwent insertion of an ILR due to unexplained syncope about 3 years ago and has reached ERI. She admits to being very sedentary because of back pain. She has undergone knee replacement surgery. No additional syncope. She is interested in having his ILR removed.  Allergies  Allergen Reactions  . Contrast Media [Iodinated Diagnostic Agents] Anaphylaxis  . Gadolinium Anaphylaxis       . Aspirin Nausea And Vomiting    Fast heart rate  . Iohexol Other (See Comments)    unknown  . Trazodone And Nefazodone     Dizziness,lightheadness, heart felt funny     Current Outpatient Prescriptions  Medication Sig Dispense Refill  . ADVAIR HFA 115-21 MCG/ACT inhaler INHALE 2 PUFFS INTO THE LUNGS TWO TIMES DAILY. 12 g 4  . Alcohol Swabs (ALCOHOL PADS) 70 % PADS Use to help clean site to check blood sugars daily Dx E11.9 100 each 3  . amitriptyline (ELAVIL) 50 MG tablet Take one table by mouth every night at bedtime. 90 tablet 3  . atorvastatin (LIPITOR) 40 MG tablet TAKE 1 TABLET BY MOUTH DAILY 90 tablet 2  . azithromycin (ZITHROMAX Z-PAK) 250 MG tablet Take 2 tablets first day and then 1 tablet a day until finished 6 each 0  . cholecalciferol (VITAMIN D) 1000 UNITS tablet Take 1,000 Units by mouth daily.    . clopidogrel (PLAVIX) 75 MG tablet TAKE ONE TABLET BY MOUTH DAILY. 90 tablet 0  . cycloSPORINE (RESTASIS) 0.05 % ophthalmic emulsion Place 1 drop into both eyes 2 (two) times daily.     Marland Kitchen DEXILANT 60 MG capsule TAKE 1 CAPSULE BY MOUTH ONCE DAILY 90 capsule 2  . diazepam (VALIUM) 5 MG tablet TAKE 1 TABLET BY MOUTH TWICE DAILY FOR MUSCLE SPASM. 60 tablet 0  . diltiazem (CARDIZEM CD) 180 MG 24 hr capsule TAKE 1 CAPSULE BY MOUTH ONCE DAILY 90 capsule 2  . FIBER SELECT GUMMIES PO Take 2 capsules by mouth daily.    Marland Kitchen gabapentin (NEURONTIN) 300 MG capsule Take 2  capsules of 300 mg each at night by mouth between one and 2 hours prior to bedtime. You can take another 300 mg in daytime for pain. 270 capsule 3  . glucose blood (ONE TOUCH ULTRA TEST) test strip 1 each by Other route 2 (two) times daily. Use to check blood sugars twice a day Dx E11.9 100 each 3  . ipratropium (ATROVENT) 0.06 % nasal spray INSTILL 2 SPRAYS IN EACH NOSTRIL FOUR TIMES A DAY 45 mL 1  . isosorbide mononitrate (IMDUR) 60 MG 24 hr tablet TAKE 1 TABLET BY MOUTH ONCE DAILY 90 tablet 3  . KOMBIGLYZE XR 05-998 MG TB24 TAKE 1 TABLET BY MOUTH EVERY EVENING WITH A MEAL 90 tablet 2  . Lancets (ONETOUCH ULTRASOFT) lancets 1 each by Other route 2 (two) times daily. Use to help check blood sugars twice a day Dx E11.9 100 each 3  . losartan (COZAAR) 100 MG tablet TAKE 1 TABLET BY MOUTH EVERY MORNING 90 tablet 3  . methocarbamol (ROBAXIN) 500 MG tablet Take 1 tablet (500 mg total) by mouth every 6 (six) hours as needed for muscle spasms. 60 tablet 0  . metoCLOPramide (REGLAN) 10 MG tablet Take 1 tablet (10 mg total) by mouth 4 (four) times daily -  before meals and at bedtime. 120 tablet 3  .  metoCLOPramide (REGLAN) 10 MG tablet TAKE 1 TABLET BY MOUTH 3 TIMES A DAY BEFORE MEALS 90 tablet 1  . nitroGLYCERIN (NITROLINGUAL) 0.4 MG/SPRAY spray Place 1 spray under the tongue every 5 (five) minutes as needed. Chest pains 12 g 1  . olopatadine (PATANOL) 0.1 % ophthalmic solution Insert 1 drop in both eyes twice daily  6  . PROAIR HFA 108 (90 Base) MCG/ACT inhaler INHALE 2 PUFFS BY MOUTH EVERY 6 HOURS FOR WHEEZING AND SHORTNESS OF BREATH 17 each 1  . topiramate (TOPAMAX) 100 MG tablet TAKE 1 TABLET BY MOUTH EVERY EVENING 30 tablet 1  . topiramate (TOPAMAX) 25 MG tablet TAKE 1 TABLET BY MOUTH EVERY MORNING 90 tablet 2   No current facility-administered medications for this visit.      Past Medical History:  Diagnosis Date  . Asthma   . Benign neoplasm of colon 11/09/09   Colonoscopy 11/09/09 -  FINDINGS: 1) 68mm polp in transverse colon, resected & retrieved.  2) Diverticulosis in sigmoid & descending colon.  3) Medium-sized, internal hemorrhoids.  . Breast cancer ([place]) 1989   Left breast cancer  . Chronic kidney disease    states she has a cyst on her kidney  . CMC arthritis    Left middle finger  . Depression    40+ years  . Diabetes mellitus 10/2008   type 2  . Diverticulosis of colon (without mention of hemorrhage)    Colonoscopy 11/09/09 - FINDINGS: 1) 8mm polp in transverse colon, resected & retrieved.  2) Diverticulosis in sigmoid & descending colon.  3) Medium-sized, internal hemorrhoids.  . Esophagitis   . Essential hypertension, benign   . Facet arthropathy, lumbar    L3-4  . GERD (gastroesophageal reflux disease)   . Hemorrhage of rectum and anus    Colonoscopy 11/09/09 - FINDINGS: 1) 48mm polp in transverse colon, resected & retrieved.  2) Diverticulosis in sigmoid & descending colon.  3) Medium-sized, internal hemorrhoids.  . Hiatal hernia   . Hypercholesteremia   . Internal hemorrhoids without mention of complication    Colonoscopy 11/09/09 - FINDINGS: 1) 37mm polp in transverse colon, resected & retrieved.  2) Diverticulosis in sigmoid & descending colon.  3) Medium-sized, internal hemorrhoids.  . Myocardial infarction [place] W/Spect W/Wall Motion EF on 08/07/10 - IMPRESSION: 1) No evidence of ischemia.  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  3) Normal LV wall motion.  4) Estimated Q G S EF 70%, unchanged.  . Obese   . Obstructive sleep apnea    uses oxygen at night  . Paroxysmal atrial fibrillation (HCC)    a identified on LINQ  . Spondylolisthesis   . Syncope    MDT LinQ implanted for evaluation    ROS:   All systems reviewed and negative except as noted in the HPI.   Past Surgical History:  Procedure Laterality Date  . ABDOMINAL HYSTERECTOMY  1978  . BACK SURGERY     x2 one  for infection  . BREAST BIOPSY Left 1995   Biopsy at [facility] (BENIGN)  . BREAST EXCISIONAL BIOPSY Left 1997  . CHOLECYSTECTOMY    . COLONOSCOPY    . COLONOSCOPY W/ POLYPECTOMY  11/09/09   FINDINGS: 1) 66mm polp in transverse colon, resected & retrieved.  2) Diverticulosis in sigmoid & descending colon.  3) Medium-sized, internal hemorrhoids.  Marland Kitchen DILATION AND CURETTAGE OF UTERUS    . ESOPHAGOGASTRODUODENOSCOPY  02/15/07  . FOOT SURGERY Bilateral    Bil feet great toes and second toes spurs. By Dr. Milinda Pointer in 2004.  Marland Kitchen HEMORRHOID SURGERY  2011   Internal & external hemorrhoidectomy (x) 3 quadrants  . JOINT REPLACEMENT     lsft hip x2, rt knee  . KNEE ARTHROPLASTY Left 11/27/2014   Procedure: LEFT TOTAL KNEE ARTHROPLASTY;  Surgeon: Marybelle Killings, MD;  Location: [place];  Service: Orthopedics;  Laterality: Left;  . KNEE ARTHROSCOPY Right 2002   By Dr. Lorin Mercy  . LOOP RECORDER IMPLANT  11/15/06; 04/02/2013   MDT Reveal implanted 10/2006 for syncope; removed with MDT LinQ implanted 03/2013 by Dr Lovena Le  . LOOP RECORDER IMPLANT N/A 04/02/2013   Procedure: LOOP RECORDER IMPLANT;  Surgeon: Evans Lance, MD;  Location: [facility] CATH LAB;  Service: Cardiovascular;  Laterality: N/A;  . TOE SURGERY    . TONSILLECTOMY    . TOTAL HIP REVISION  12/14/2010   Procedure: TOTAL HIP REVISION;  Surgeon: Marybelle Killings;  Location: [place];  Service: Orthopedics;  Laterality: Left;  Left Total Hip Arthroplasty Revision, Poly and Ball Exchange, Possible Acetabular Revision vs. Poly Exchange  . TOTAL KNEE ARTHROPLASTY Left 11/27/2014  . TRIGGER FINGER RELEASE Left 01/02/2013   Procedure: RELEASE LEFT MIDDLE TRIGGER FINGER/LEFT INDEX AND LEFT RING FINGER;  Surgeon: Wynonia Sours, MD;  Location: [place];  Service: Orthopedics;  Laterality: Left;  ANESTHESIA: GENERAL/IV REGIONAL FAB     Family History  Problem Relation Age of Onset  . Pancreatic cancer Mother   . Heart attack Father   .  Diabetes Son   . Arthritis Brother     Severe/Crippling  . Tuberculosis Brother   . Hypertension Brother     x 2  . Heart disease Sister   . Diabetes Brother   . Seizures Brother   . Autoimmune disease Brother     AIDS  . Colon cancer Brother   . Lung cancer Brother     mets     Social History   Social History  . Marital status: Single    Spouse name: N/A  . Number of children: 2  . Years of education: college   Occupational History  . Retired    Social History Main Topics  . Smoking status: Former Smoker    Types: Cigarettes    Quit date: 01/19/1983  . Smokeless tobacco: Never Used  . Alcohol use No  . Drug use: No  . Sexual activity: Not on file   Other Topics Concern  . Not on file   Social History Narrative   Patient is single and lives alone.   Patient has two children.   Patient is retired.   Patient has a college education.   Patient is right-handed.   Patient does not drink any caffeine.     BP 122/70   Pulse 89   Ht 5\' 4"  (1.626 m)   Wt 212 lb 9.6 oz (96.4 kg)   SpO2 98%   BMI 36.49 kg/m   Physical Exam:  obese appearing 71 year old woman, NAD HEENT: Unremarkable Neck:  No JVD, no thyromegally Back:  No CVA tenderness Lungs:  Clear with no wheezes, rales, or rhonchi. HEART:  Regular rate rhythm, no murmurs, no rubs, no clicks Abd:  soft, positive bowel sounds, no organomegally, no rebound, no guarding Ext:  2 plus pulses, no edema, no cyanosis, no clubbing Skin:  No rashes no nodules Neuro:  CN II through XII  intact, motor grossly intact  ILR interogation demonstrated no sustained SVT, atrial fib, VT, or bradycardic episodes. She did have undersensing.   Assess/Plan: 1. Syncope - she has had no recurrent episodes. We will schedule her to have her ILR removed. 2. HTN - her blood pressure is well controlled.  3. Obesity - I have encouraged her to lose weight.   Mikle Bosworth.D.

## 2016-04-05 NOTE — Progress Notes (Signed)
Dr. Lovena Le in to see pt. Ok to D/C pt per Dr. Lovena Le.

## 2016-04-05 NOTE — Interval H&P Note (Signed)
History and Physical Interval Note:  04/05/2016 8:44 AM  [name]  has presented today for surgery, with the diagnosis of rrt  The various methods of treatment have been discussed with the patient and family. After consideration of risks, benefits and other options for treatment, the patient has consented to  Procedure(s): Loop Recorder Removal (N/A) as a surgical intervention .  The patient's history has been reviewed, patient examined, no change in status, stable for surgery.  I have reviewed the patient's chart and labs.  Questions were answered to the patient's satisfaction.     [name]

## 2016-04-05 NOTE — Discharge Instructions (Signed)
Call Dr. Tanna Furry office with any signs/symptoms of infection such as fever, redness, swelling or new/unusual drainage from area where loop recorder was removed.

## 2016-04-05 NOTE — Progress Notes (Signed)
Pt attempted to get out of recliner without putting the foot rest down.  Her right leg slid out from under her and she slid to the floor.  She did not hit her head but is C/O left arm pain and pain to right posterior neck discomfort.  No obvious signs of injury.  Dr Lovena Le notified and will see

## 2016-04-12 ENCOUNTER — Ambulatory Visit (INDEPENDENT_AMBULATORY_CARE_PROVIDER_SITE_OTHER): Payer: Self-pay | Admitting: *Deleted

## 2016-04-12 DIAGNOSIS — R55 Syncope and collapse: Secondary | ICD-10-CM

## 2016-04-12 NOTE — Progress Notes (Signed)
Wound check in clinic s/p LINQ explant 04/05/16. Steri strips removed. Incision edges approximated without redness, swelling or drainage. Patient educated about wound care and to call if she develops redness, swelling or drainage at explant site. She verbalizes understanding. She reports she gave her monitor in return box to [employer] today.

## 2016-04-14 NOTE — Telephone Encounter (Signed)
Pt called in and said that her test strip and supplies need to be sent to physicians  pharmacy

## 2016-04-15 ENCOUNTER — Other Ambulatory Visit: Payer: Self-pay | Admitting: Internal Medicine

## 2016-04-15 MED ORDER — ONETOUCH ULTRASOFT LANCETS MISC: 1.0000 | Freq: Two times a day (BID) | 3 refills | Status: DC

## 2016-04-15 MED ORDER — GLUCOSE BLOOD VI STRP: 1.0000 | ORAL_STRIP | Freq: Two times a day (BID) | 3 refills | Status: DC

## 2016-04-15 NOTE — Telephone Encounter (Signed)
sent 

## 2016-04-22 ENCOUNTER — Other Ambulatory Visit: Payer: Self-pay | Admitting: Internal Medicine

## 2016-04-22 ENCOUNTER — Other Ambulatory Visit: Payer: Self-pay | Admitting: Family

## 2016-04-23 ENCOUNTER — Other Ambulatory Visit: Payer: Self-pay | Admitting: *Deleted

## 2016-04-23 MED ORDER — NITROGLYCERIN 0.4 MG/SPRAY TL SOLN: 1.0000 | 1 refills | Status: AC | PRN

## 2016-04-27 ENCOUNTER — Encounter (INDEPENDENT_AMBULATORY_CARE_PROVIDER_SITE_OTHER): Payer: Self-pay | Admitting: Orthopaedic Surgery

## 2016-04-27 ENCOUNTER — Ambulatory Visit (INDEPENDENT_AMBULATORY_CARE_PROVIDER_SITE_OTHER): Payer: Medicare Other

## 2016-04-27 ENCOUNTER — Ambulatory Visit (INDEPENDENT_AMBULATORY_CARE_PROVIDER_SITE_OTHER): Payer: Medicare Other | Admitting: Orthopaedic Surgery

## 2016-04-27 VITALS — BP 131/85 | HR 90 | Ht 64.5 in | Wt 211.0 lb

## 2016-04-27 DIAGNOSIS — M19012 Primary osteoarthritis, left shoulder: Secondary | ICD-10-CM | POA: Diagnosis not present

## 2016-04-27 DIAGNOSIS — M25512 Pain in left shoulder: Secondary | ICD-10-CM

## 2016-04-27 DIAGNOSIS — M1611 Unilateral primary osteoarthritis, right hip: Secondary | ICD-10-CM

## 2016-04-27 MED ORDER — METHYLPREDNISOLONE ACETATE 40 MG/ML IJ SUSP
40.0000 mg | INTRAMUSCULAR | Status: AC | PRN
  Administered 2016-04-27: 40 mg via INTRA_ARTICULAR

## 2016-04-27 MED ORDER — LIDOCAINE HCL 1 % IJ SOLN
1.0000 mL | INTRAMUSCULAR | Status: AC | PRN
  Administered 2016-04-27: 1 mL

## 2016-04-27 MED ORDER — BUPIVACAINE HCL 0.25 % IJ SOLN
4.0000 mL | INTRAMUSCULAR | Status: AC | PRN
  Administered 2016-04-27: 4 mL via INTRA_ARTICULAR

## 2016-04-27 NOTE — Progress Notes (Signed)
Office Visit Note   Patient: [name]           Date of Birth: 1945/01/21           MRN: [id number] Visit Date: 04/27/2016              Requested by: Hoyt Koch, MD [place],  [id number] PCP: Hoyt Koch, MD   Assessment & Plan: Visit Diagnoses:  1. Unilateral primary osteoarthritis, right hip   2. Acute pain of left shoulder     Plan: We proceeded with aspiration requested with a repeat left shoulder injection which previously gave her about 3 years of relief. Patient states she like to proceed with scheduling total hip arthroplasty on the right hip. Treatment plan discussed questions were elicited and answered. She's continues to walk or prevent falling. She has pain with the every step positive Trendelenburg gait and progressive osteoarthritic changes on x-ray with loss of joint space and marginal osteophytes. No changes that would suggest AVN on plain radiograph.  Follow-Up Instructions: No Follow-up on file.   Orders:  Orders Placed This Encounter  Procedures  . XR Pelvis 1-2 Views  . XR Shoulder Left   No orders of the defined types were placed in this encounter.     Procedures: Large Joint Inj Date/Time: 04/27/2016 2:06 PM Performed by: Marybelle Killings Authorized by: Rodell Perna C   Consent Given by:  Patient Indications:  Pain Location:  Shoulder Site:  L glenohumeral Needle Size:  22 G Needle Length:  1.5 inches Ultrasound Guidance: No   Fluoroscopic Guidance: No   Arthrogram: No   Medications:  1 mL lidocaine 1 %; 40 mg methylPREDNISolone acetate 40 MG/ML; 4 mL bupivacaine 0.25 % Patient tolerance:  Patient tolerated the procedure well with no immediate complications     Clinical Data: No additional findings.   Subjective: Chief Complaint  Patient presents with  . Right Hip - Follow-up  . Left Knee - Follow-up  . Left Shoulder - Pain    HPI patient having 2 problems one is progressive increase right  groin pain with ambulation. She's been using a walker to prevent falling in the house. She still has some discomfort after left total knee arthroplasty with some calcification of the quadriceps tendon 49 cm proximal to the patella. She has full extension of her knee good flexion but occasionally she feels sharp pain and points directly the area where there is calcification of the latter sepsis. She states she is ready to proceed with the right shoulder arthroplasty latter part of next month.  Review of Systems  Constitutional: Negative for chills and diaphoresis.  HENT: Negative for ear discharge, ear pain and nosebleeds.   Eyes: Negative for discharge and visual disturbance.  Respiratory: Negative for cough, choking and shortness of breath.   Cardiovascular: Negative for chest pain and palpitations.       Previous removal of loop recorder without arrhythmias.  Gastrointestinal: Negative for abdominal distention and abdominal pain.  Endocrine: Negative for cold intolerance and heat intolerance.       Positive for diabetes she does not take insulin.  Genitourinary: Negative for flank pain and hematuria.  Musculoskeletal:       Previous bilateral total knee arthroplasties. Left knee develop some postoperative calcification in the quadriceps tendon. Previous left total hip 3 first 2 done in [place]. We did a poly-exchange procedure in and it is done well since that time. Progressive end-stage right hip  osteoarthritis. She is also having acute left shoulder pain with impingement. Previous injection lasted 3 years.  Skin: Negative for rash and wound.  Neurological: Negative for seizures and speech difficulty.  Hematological: Negative for adenopathy. Does not bruise/bleed easily.  Psychiatric/Behavioral: Negative for agitation and suicidal ideas.     Objective: Vital Signs: BP 131/85   Pulse 90   Ht 5' 4.5" (1.638 m)   Wt 211 lb (95.7 kg)   BMI 35.66 kg/m   Physical Exam  Constitutional:  She is oriented to person, place, and time. She appears well-developed.  HENT:  Head: Normocephalic.  Right Ear: External ear normal.  Left Ear: External ear normal.  Eyes: Pupils are equal, round, and reactive to light.  Classes  Neck: No tracheal deviation present. No thyromegaly present.  Cardiovascular: Normal rate.   Pulmonary/Chest: Effort normal. She has no wheezes. She has no rales.  Abdominal: Soft.  Musculoskeletal:  Bilateral midline knee incisions. Slightly tender calcification in the quadriceps tendon. Full extension good quad strength right and left. Right knee has no tenderness. She has 15 internal rotation right hip which reproduces her pain. Posterior hip incision on the left hip. No hip flexion contracture either hip. Sensation distal pulses are intact.  Neurological: She is alert and oriented to person, place, and time.  Skin: Skin is warm and dry.  Psychiatric: She has a normal mood and affect. Her behavior is normal.    Ortho Exam left shoulder positive impingement lung of the biceps is tender. No subluxation. Neck negative lateral sulcus sign positive Neer.  Specialty Comments:  No specialty comments available.  Imaging: No results found.   PMFS History: Patient Active Problem List   Diagnosis Date Noted  . Unilateral primary osteoarthritis, right hip 12/26/2015  . Atrial fibrillation ([place]) 01/07/2015  . Status post total left knee replacement 11/27/2014  . Insomnia due to medical condition 10/15/2014  . DDD (degenerative disc disease), lumbar 10/15/2014  . Preventative health care 08/13/2014  . Asthma 05/17/2014  . Obstructive sleep apnea   . Other type of migraine   . DM (diabetes mellitus) type II controlled, neurological manifestation ([place])   . Dyslipidemia 11/12/2008  . Essential hypertension, benign 11/12/2008  . SYNCOPE 11/12/2008   Past Medical History:  Diagnosis Date  . Asthma   . Benign neoplasm of colon 11/09/09   Colonoscopy 11/09/09  - FINDINGS: 1) 70mm polp in transverse colon, resected & retrieved.  2) Diverticulosis in sigmoid & descending colon.  3) Medium-sized, internal hemorrhoids.  . Breast cancer ([place]) 1989   Left breast cancer  . Chronic kidney disease    states she has a cyst on her kidney  . CMC arthritis    Left middle finger  . Depression    40+ years  . Diabetes mellitus 10/2008   type 2  . Diverticulosis of colon (without mention of hemorrhage)    Colonoscopy 11/09/09 - FINDINGS: 1) 21mm polp in transverse colon, resected & retrieved.  2) Diverticulosis in sigmoid & descending colon.  3) Medium-sized, internal hemorrhoids.  . Esophagitis   . Essential hypertension, benign   . Facet arthropathy, lumbar    L3-4  . GERD (gastroesophageal reflux disease)   . Hemorrhage of rectum and anus    Colonoscopy 11/09/09 - FINDINGS: 1) 13mm polp in transverse colon, resected & retrieved.  2) Diverticulosis in sigmoid & descending colon.  3) Medium-sized, internal hemorrhoids.  . Hiatal hernia   . Hypercholesteremia   . Internal hemorrhoids without mention of  complication    Colonoscopy 11/09/09 - FINDINGS: 1) 77mm polp in transverse colon, resected & retrieved.  2) Diverticulosis in sigmoid & descending colon.  3) Medium-sized, internal hemorrhoids.  . Myocardial infarction [place] W/Spect W/Wall Motion EF on 08/07/10 - IMPRESSION: 1) No evidence of ischemia.  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  3) Normal LV wall motion.  4) Estimated Q G S EF 70%, unchanged.  . Obese   . Obstructive sleep apnea    uses oxygen at night  . Paroxysmal atrial fibrillation (HCC)    a identified on LINQ  . Spondylolisthesis   . Syncope    MDT LinQ implanted for evaluation    Family History  Problem Relation Age of Onset  . Pancreatic cancer Mother   . Heart attack Father   . Diabetes Son   . Arthritis Brother     Severe/Crippling  . Tuberculosis Brother    . Hypertension Brother     x 2  . Heart disease Sister   . Diabetes Brother   . Seizures Brother   . Autoimmune disease Brother     AIDS  . Colon cancer Brother   . Lung cancer Brother     mets    Past Surgical History:  Procedure Laterality Date  . ABDOMINAL HYSTERECTOMY  1978  . BACK SURGERY     x2 one for infection  . BREAST BIOPSY Left 1995   Biopsy at [facility] (BENIGN)  . BREAST EXCISIONAL BIOPSY Left 1997  . CHOLECYSTECTOMY    . COLONOSCOPY    . COLONOSCOPY W/ POLYPECTOMY  11/09/09   FINDINGS: 1) 33mm polp in transverse colon, resected & retrieved.  2) Diverticulosis in sigmoid & descending colon.  3) Medium-sized, internal hemorrhoids.  Marland Kitchen DILATION AND CURETTAGE OF UTERUS    . ESOPHAGOGASTRODUODENOSCOPY  02/15/07  . FOOT SURGERY Bilateral    Bil feet great toes and second toes spurs. By Dr. Milinda Pointer in 2004.  Marland Kitchen HEMORRHOID SURGERY  2011   Internal & external hemorrhoidectomy (x) 3 quadrants  . JOINT REPLACEMENT     lsft hip x2, rt knee  . KNEE ARTHROPLASTY Left 11/27/2014   Procedure: LEFT TOTAL KNEE ARTHROPLASTY;  Surgeon: Marybelle Killings, MD;  Location: [place];  Service: Orthopedics;  Laterality: Left;  . KNEE ARTHROSCOPY Right 2002   By Dr. Lorin Mercy  . LOOP RECORDER IMPLANT  11/15/06; 04/02/2013   MDT Reveal implanted 10/2006 for syncope; removed with MDT LinQ implanted 03/2013 by Dr Lovena Le  . LOOP RECORDER IMPLANT N/A 04/02/2013   Procedure: LOOP RECORDER IMPLANT;  Surgeon: Evans Lance, MD;  Location: [facility] CATH LAB;  Service: Cardiovascular;  Laterality: N/A;  . LOOP RECORDER REMOVAL N/A 04/05/2016   Procedure: Loop Recorder Removal;  Surgeon: Evans Lance, MD;  Location: [place] CV LAB;  Service: Cardiovascular;  Laterality: N/A;  . TOE SURGERY    . TONSILLECTOMY    . TOTAL HIP REVISION  12/14/2010   Procedure: TOTAL HIP REVISION;  Surgeon: Marybelle Killings;  Location: [place];  Service: Orthopedics;  Laterality: Left;  Left Total Hip Arthroplasty Revision,  Poly and Ball Exchange, Possible Acetabular Revision vs. Poly Exchange  . TOTAL KNEE ARTHROPLASTY Left 11/27/2014  . TRIGGER FINGER RELEASE Left 01/02/2013   Procedure: RELEASE LEFT MIDDLE TRIGGER FINGER/LEFT INDEX AND LEFT RING FINGER;  Surgeon: Wynonia Sours, MD;  Location: [place];  Service:  Orthopedics;  Laterality: Left;  ANESTHESIA: GENERAL/IV REGIONAL FAB   Social History   Occupational History  . Retired    Social History Main Topics  . Smoking status: Former Smoker    Types: Cigarettes    Quit date: 01/19/1983  . Smokeless tobacco: Never Used  . Alcohol use No  . Drug use: No  . Sexual activity: Not on file

## 2016-05-02 DIAGNOSIS — J45998 Other asthma: Secondary | ICD-10-CM | POA: Diagnosis not present

## 2016-05-07 DIAGNOSIS — R350 Frequency of micturition: Secondary | ICD-10-CM | POA: Diagnosis not present

## 2016-05-12 ENCOUNTER — Encounter (HOSPITAL_COMMUNITY): Payer: Self-pay | Admitting: Emergency Medicine

## 2016-05-12 ENCOUNTER — Emergency Department (HOSPITAL_COMMUNITY)
Admission: EM | Admit: 2016-05-12 | Discharge: 2016-05-12 | Disposition: A | Discharge status: Home / Self Care | Payer: Medicare Other | Attending: Emergency Medicine | Admitting: Emergency Medicine

## 2016-05-12 ENCOUNTER — Encounter: Payer: Self-pay | Admitting: Podiatry

## 2016-05-12 ENCOUNTER — Ambulatory Visit (INDEPENDENT_AMBULATORY_CARE_PROVIDER_SITE_OTHER): Payer: Medicare Other | Admitting: Podiatry

## 2016-05-12 ENCOUNTER — Emergency Department (HOSPITAL_COMMUNITY): Payer: Medicare Other

## 2016-05-12 DIAGNOSIS — N189 Chronic kidney disease, unspecified: Secondary | ICD-10-CM | POA: Insufficient documentation

## 2016-05-12 DIAGNOSIS — K529 Noninfective gastroenteritis and colitis, unspecified: Secondary | ICD-10-CM | POA: Diagnosis not present

## 2016-05-12 DIAGNOSIS — Z853 Personal history of malignant neoplasm of breast: Secondary | ICD-10-CM | POA: Diagnosis not present

## 2016-05-12 DIAGNOSIS — Z79899 Other long term (current) drug therapy: Secondary | ICD-10-CM | POA: Insufficient documentation

## 2016-05-12 DIAGNOSIS — E1122 Type 2 diabetes mellitus with diabetic chronic kidney disease: Secondary | ICD-10-CM | POA: Insufficient documentation

## 2016-05-12 DIAGNOSIS — R1031 Right lower quadrant pain: Secondary | ICD-10-CM

## 2016-05-12 DIAGNOSIS — Z96652 Presence of left artificial knee joint: Secondary | ICD-10-CM | POA: Insufficient documentation

## 2016-05-12 DIAGNOSIS — B351 Tinea unguium: Secondary | ICD-10-CM | POA: Diagnosis not present

## 2016-05-12 DIAGNOSIS — R109 Unspecified abdominal pain: Secondary | ICD-10-CM | POA: Diagnosis present

## 2016-05-12 DIAGNOSIS — J45909 Unspecified asthma, uncomplicated: Secondary | ICD-10-CM | POA: Insufficient documentation

## 2016-05-12 DIAGNOSIS — I252 Old myocardial infarction: Secondary | ICD-10-CM | POA: Insufficient documentation

## 2016-05-12 DIAGNOSIS — I129 Hypertensive chronic kidney disease with stage 1 through stage 4 chronic kidney disease, or unspecified chronic kidney disease: Secondary | ICD-10-CM | POA: Diagnosis not present

## 2016-05-12 DIAGNOSIS — K5730 Diverticulosis of large intestine without perforation or abscess without bleeding: Secondary | ICD-10-CM | POA: Diagnosis not present

## 2016-05-12 DIAGNOSIS — M79676 Pain in unspecified toe(s): Secondary | ICD-10-CM | POA: Diagnosis not present

## 2016-05-12 DIAGNOSIS — Z87891 Personal history of nicotine dependence: Secondary | ICD-10-CM | POA: Insufficient documentation

## 2016-05-12 LAB — BASIC METABOLIC PANEL
ANION GAP: 10 (ref 5–15)
BUN: 6 mg/dL (ref 6–20)
CALCIUM: 9.2 mg/dL (ref 8.9–10.3)
CO2: 23 mmol/L (ref 22–32)
Chloride: 108 mmol/L (ref 101–111)
Creatinine, Ser: 0.85 mg/dL (ref 0.44–1.00)
GFR calc Af Amer: >60 mL/min (ref >60)
GLUCOSE: 104 mg/dL — AB (ref 65–99)
Potassium: 3.6 mmol/L (ref 3.5–5.1)
Sodium: 141 mmol/L (ref 135–145)

## 2016-05-12 LAB — URINALYSIS, ROUTINE W REFLEX MICROSCOPIC
BILIRUBIN URINE: NEGATIVE
Glucose, UA: NEGATIVE mg/dL
HGB URINE DIPSTICK: NEGATIVE
KETONES UR: NEGATIVE mg/dL
Leukocytes, UA: NEGATIVE
NITRITE: NEGATIVE
PROTEIN: NEGATIVE mg/dL
Specific Gravity, Urine: 1.008 (ref 1.005–1.030)
pH: 6 (ref 5.0–8.0)

## 2016-05-12 LAB — CBC
HCT: 33.9 % — ABNORMAL LOW (ref 36.0–46.0)
Hemoglobin: 10.4 g/dL — ABNORMAL LOW (ref 12.0–15.0)
MCH: 25.4 pg — ABNORMAL LOW (ref 26.0–34.0)
MCHC: 30.7 g/dL (ref 30.0–36.0)
MCV: 82.9 fL (ref 78.0–100.0)
Platelets: 359 10*3/uL (ref 150–400)
RBC: 4.09 MIL/uL (ref 3.87–5.11)
RDW: 17.2 % — AB (ref 11.5–15.5)
WBC: 12.4 10*3/uL — ABNORMAL HIGH (ref 4.0–10.5)

## 2016-05-12 MED ORDER — CYCLOBENZAPRINE HCL 10 MG PO TABS
10.0000 mg | ORAL_TABLET | Freq: Once | ORAL | Status: AC
  Administered 2016-05-12: 10 mg via ORAL
  Filled 2016-05-12: qty 1

## 2016-05-12 MED ORDER — OXYCODONE HCL 5 MG PO TABS: 5.0000 mg | ORAL_TABLET | ORAL | 0 refills | Status: DC | PRN

## 2016-05-12 MED ORDER — OXYCODONE-ACETAMINOPHEN 5-325 MG PO TABS
1.0000 | ORAL_TABLET | Freq: Once | ORAL | Status: AC
  Administered 2016-05-12: 1 via ORAL
  Filled 2016-05-12: qty 1

## 2016-05-12 MED ORDER — DICYCLOMINE HCL 20 MG PO TABS: 20.0000 mg | ORAL_TABLET | Freq: Two times a day (BID) | ORAL | 0 refills | Status: AC

## 2016-05-12 NOTE — ED Notes (Signed)
Patient transported to CT via wheelchair

## 2016-05-12 NOTE — ED Provider Notes (Signed)
[facility] DEPT Provider Note   CSN: [id number] Arrival date & time: 05/12/16  1811     History   Chief Complaint Chief Complaint  Patient presents with  . Flank Pain    HPI [name] is a 71 y.o. female.  HPI  Right flank pain radiating around to the right groin, began Thursday and has been increasing. Sunday and Monday was getting worse.  Pain is very severe.  Has never had this kind of pain before.  Has a history of back pain before, had 2 surgeries and injections on back which didn't work, so they "burnt the nerves" and this pain is worse than that.  This is worse than that.  That pain was a 15 and so is this and can't take it.  Would have come yesterday but it was raining.  Went to the foot doctor via [facility].  No falls or trauma recently. Fell on 4/2 onto the left side.  Has had right flank pain off and on for a few years radiating to groin but never this severe. No nausea, vomiting, no fevers.   Not moving helps the pain.  Moving makes it worse. Put some heat on it.  Heat helps some.  Took some tylenol which didn't help.   Used to have medicine for back but haven't seen him in a while. This is different location.  Reports this is hip pain, moving at the hip makes the pain worse in the groin.  Not worse with back movements. Worse with walking. Uses walker. No loss control bowel bladder, fevers.  Past Medical History:  Diagnosis Date  . Asthma   . Benign neoplasm of colon 11/09/09   Colonoscopy 11/09/09 - FINDINGS: 1) 8mm polp in transverse colon, resected & retrieved.  2) Diverticulosis in sigmoid & descending colon.  3) Medium-sized, internal hemorrhoids.  . Breast cancer ([place]) 1989   Left breast cancer  . Chronic kidney disease    states she has a cyst on her kidney  . [facility] arthritis    Left middle finger  . Depression    40+ years  . Diabetes mellitus 10/2008   type 2  . Diverticulosis of colon (without mention of hemorrhage)    Colonoscopy 11/09/09 -  FINDINGS: 1) 72mm polp in transverse colon, resected & retrieved.  2) Diverticulosis in sigmoid & descending colon.  3) Medium-sized, internal hemorrhoids.  . Esophagitis   . Essential hypertension, benign   . Facet arthropathy, lumbar (HCC)    L3-4  . GERD (gastroesophageal reflux disease)   . Hemorrhage of rectum and anus    Colonoscopy 11/09/09 - FINDINGS: 1) 68mm polp in transverse colon, resected & retrieved.  2) Diverticulosis in sigmoid & descending colon.  3) Medium-sized, internal hemorrhoids.  . Hiatal hernia   . Hypercholesteremia   . Internal hemorrhoids without mention of complication    Colonoscopy 11/09/09 - FINDINGS: 1) 60mm polp in transverse colon, resected & retrieved.  2) Diverticulosis in sigmoid & descending colon.  3) Medium-sized, internal hemorrhoids.  . Myocardial infarction ([place]) [place] Multi W/Spect W/Wall Motion EF on 08/07/10 - IMPRESSION: 1) No evidence of ischemia.  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  3) Normal LV wall motion.  4) Estimated Q G S EF 70%, unchanged.  . Obese   . Obstructive sleep apnea    uses oxygen at night  . Paroxysmal atrial fibrillation (HCC)    a  identified on LINQ  . Spondylolisthesis   . Syncope    MDT LinQ implanted for evaluation    Patient Active Problem List   Diagnosis Date Noted  . Unilateral primary osteoarthritis, right hip 12/26/2015  . Atrial fibrillation ([place]) 01/07/2015  . Status post total left knee replacement 11/27/2014  . Insomnia due to medical condition 10/15/2014  . DDD (degenerative disc disease), lumbar 10/15/2014  . Primary osteoarthritis, left shoulder 10/15/2014  . Preventative health care 08/13/2014  . Asthma 05/17/2014  . Obstructive sleep apnea   . Other type of migraine   . DM (diabetes mellitus) type II controlled, neurological manifestation ([place])   . Dyslipidemia 11/12/2008  . Essential hypertension, benign 11/12/2008  .  SYNCOPE 11/12/2008    Past Surgical History:  Procedure Laterality Date  . ABDOMINAL HYSTERECTOMY  1978  . BACK SURGERY     x2 one for infection  . BREAST BIOPSY Left 1995   [facility] at [facility] ([facility])  . BREAST EXCISIONAL BIOPSY Left 1997  . CHOLECYSTECTOMY    . COLONOSCOPY    . COLONOSCOPY W/ POLYPECTOMY  11/09/09   FINDINGS: 1) 42mm polp in transverse colon, resected & retrieved.  2) Diverticulosis in sigmoid & descending colon.  3) Medium-sized, internal hemorrhoids.  Marland Kitchen DILATION AND CURETTAGE OF UTERUS    . ESOPHAGOGASTRODUODENOSCOPY  02/15/07  . FOOT SURGERY Bilateral    Bil feet great toes and second toes spurs. By Dr. Milinda Pointer in 2004.  Marland Kitchen HEMORRHOID SURGERY  2011   Internal & external hemorrhoidectomy (x) 3 quadrants  . JOINT REPLACEMENT     lsft hip x2, rt knee  . KNEE ARTHROPLASTY Left 11/27/2014   Procedure: LEFT TOTAL KNEE ARTHROPLASTY;  Surgeon: Marybelle Killings, MD;  Location: [facility];  Service: Orthopedics;  Laterality: Left;  . KNEE ARTHROSCOPY Right 2002   By Dr. Lorin Mercy  . LOOP RECORDER IMPLANT  11/15/06; 04/02/2013   MDT Reveal implanted 10/2006 for syncope; removed with MDT LinQ implanted 03/2013 by Dr Lovena Le  . LOOP RECORDER IMPLANT N/A 04/02/2013   Procedure: LOOP RECORDER IMPLANT;  Surgeon: Evans Lance, MD;  Location: [facility] CATH LAB;  Service: Cardiovascular;  Laterality: N/A;  . LOOP RECORDER REMOVAL N/A 04/05/2016   Procedure: Loop Recorder Removal;  Surgeon: Evans Lance, MD;  Location: [facility] CV LAB;  Service: Cardiovascular;  Laterality: N/A;  . TOE SURGERY    . TONSILLECTOMY    . TOTAL HIP REVISION  12/14/2010   Procedure: TOTAL HIP REVISION;  Surgeon: Marybelle Killings;  Location: [facility];  Service: Orthopedics;  Laterality: Left;  Left Total Hip Arthroplasty Revision, Poly and Ball Exchange, Possible Acetabular Revision vs. Poly Exchange  . TOTAL KNEE ARTHROPLASTY Left 11/27/2014  . TRIGGER FINGER RELEASE Left 01/02/2013   Procedure: RELEASE LEFT  MIDDLE TRIGGER FINGER/LEFT INDEX AND LEFT RING FINGER;  Surgeon: Wynonia Sours, MD;  Location: [place] [facility];  Service: Orthopedics;  Laterality: Left;  ANESTHESIA: GENERAL/IV REGIONAL FAB    OB History    No data available       Home Medications    Prior to Admission medications   Medication Sig Start Date End Date Taking? Authorizing Provider  acetaminophen (TYLENOL) 500 MG tablet Take 1,500 mg by mouth 3 (three) times daily as needed for moderate pain or headache.    Historical Provider, MD  ADVAIR [facility] 115-21 MCG/ACT inhaler INHALE 2 PUFFS INTO THE LUNGS TWO TIMES DAILY. 03/26/16   Hoyt Koch, MD  Alcohol Swabs (ALCOHOL PADS) 70 % PADS Use to help clean site to check blood sugars daily Dx E11.9 07/29/15   Hoyt Koch, MD  amitriptyline (ELAVIL) 50 MG tablet Take one table by mouth every night at bedtime. Patient taking differently: Take 50 mg by mouth at bedtime.  04/14/15   Asencion Partridge Dohmeier, MD  atorvastatin (LIPITOR) 40 MG tablet TAKE 1 TABLET BY MOUTH DAILY Patient taking differently: Take 40mg s once daily in the evening 01/30/16   Hoyt Koch, MD  azithromycin (ZITHROMAX Z-PAK) 250 MG tablet Take 2 tablets first day and then 1 tablet a day until finished 01/05/16   Evans Lance, MD  Cholecalciferol (VITAMIN D3) 2000 units capsule Take 2,000 Units by mouth daily after breakfast.    Historical Provider, MD  clopidogrel (PLAVIX) 75 MG tablet TAKE ONE TABLET BY MOUTH DAILY. 12/31/15   Hoyt Koch, MD  cycloSPORINE (RESTASIS) 0.05 % ophthalmic emulsion Place 1 drop into both eyes 2 (two) times daily.     Historical Provider, MD  DEXILANT 60 MG capsule TAKE 1 CAPSULE BY MOUTH ONCE DAILY 10/08/15   Hoyt Koch, MD  diazepam (VALIUM) 5 MG tablet TAKE 1 TABLET BY MOUTH TWICE DAILY FOR MUSCLE SPASM 04/23/16   Hoyt Koch, MD  dicyclomine (BENTYL) 20 MG tablet Take 1 tablet (20 mg total) by mouth 2 (two) times daily. For abdominal pain  05/12/16   Gareth Morgan, MD  diltiazem (CARDIZEM CD) 180 MG 24 hr capsule TAKE 1 CAPSULE BY MOUTH ONCE DAILY 01/30/16   Hoyt Koch, MD  diphenhydrAMINE (BENADRYL) 25 MG tablet Take 25 mg by mouth at bedtime. May take 25mg s during the day as needed for itching    Historical Provider, MD  [place] Take 2 capsules by mouth daily.    Historical Provider, MD  gabapentin (NEURONTIN) 300 MG capsule Take 2 capsules of 300 mg each at night by mouth between one and 2 hours prior to bedtime. You can take another 300 mg in daytime for pain. Patient taking differently: Take 300mg s in the evening at 5pm and then take 600mg s at night 04/14/15   Asencion Partridge Dohmeier, MD  glucose blood (ONE TOUCH ULTRA TEST) test strip 1 each by Other route 2 (two) times daily. Use to check blood sugars twice a day Dx E11.9 04/15/16   Hoyt Koch, MD  Hypromellose (ARTIFICIAL TEARS OP) Apply 1 drop to eye 2 (two) times daily.    Historical Provider, MD  ipratropium (ATROVENT) 0.06 % nasal spray INSTILL 2 SPRAYS IN EACH NOSTRIL FOUR TIMES A DAY 03/18/16   Hoyt Koch, MD  isosorbide mononitrate (IMDUR) 60 MG 24 hr tablet TAKE 1 TABLET BY MOUTH ONCE DAILY Patient taking differently: TAKE 1 TABLET BY MOUTH ONCE DAILY AT [facility] 01/29/16   Evans Lance, MD  KOMBIGLYZE XR 05-998 MG TB24 TAKE 1 TABLET BY MOUTH EVERY EVENING WITH A MEAL 01/30/16   Hoyt Koch, MD  Lancets [facility] ULTRASOFT) lancets 1 each by Other route 2 (two) times daily. Use to help check blood sugars twice a day Dx E11.9 04/15/16   Hoyt Koch, MD  losartan (COZAAR) 100 MG tablet TAKE 1 TABLET BY MOUTH EVERY MORNING 01/29/16   Evans Lance, MD  metoCLOPramide (REGLAN) 10 MG tablet Take 1 tablet (10 mg total) by mouth 4 (four) times daily -  before meals and at bedtime. 09/03/15   Hoyt Koch, MD  mirabegron ER [facility]) 25  MG TB24 tablet Take 25 mg by mouth every evening.    Historical Provider, MD    nitroGLYCERIN (NITROLINGUAL) 0.4 MG/SPRAY spray Place 1 spray under the tongue every 5 (five) minutes as needed. Chest pains 04/23/16   Evans Lance, MD  olopatadine (PATANOL) 0.1 % ophthalmic solution Insert 1 drop in both eyes twice daily 06/18/14   Historical Provider, MD  oxyCODONE (ROXICODONE) 5 MG immediate release tablet Take 1 tablet (5 mg total) by mouth every 4 (four) hours as needed for severe pain. 05/12/16   Gareth Morgan, MD  PROAIR HFA 108 [phone] Base) MCG/ACT inhaler INHALE 2 PUFFS BY MOUTH EVERY 6 HOURS FOR WHEEZING AND SHORTNESS OF BREATH 12/03/15   Hoyt Koch, MD  topiramate (TOPAMAX) 100 MG tablet TAKE 1 TABLET BY MOUTH EVERY EVENING 04/23/16   Hoyt Koch, MD  topiramate (TOPAMAX) 25 MG tablet TAKE 1 TABLET BY MOUTH EVERY MORNING 12/31/15   Hoyt Koch, MD    Family History Family History  Problem Relation Age of Onset  . Pancreatic cancer Mother   . Heart attack Father   . Diabetes Son   . Arthritis Brother     Severe/Crippling  . Tuberculosis Brother   . Hypertension Brother     x 2  . Heart disease Sister   . Diabetes Brother   . Seizures Brother   . Autoimmune disease Brother     AIDS  . Colon cancer Brother   . Lung cancer Brother     mets    Social History Social History  Substance Use Topics  . Smoking status: Former Smoker    Types: Cigarettes    Quit date: 01/19/1983  . Smokeless tobacco: Never Used  . Alcohol use No     Allergies   Contrast media [iodinated diagnostic agents]; Gadolinium; Iohexol; Aspirin; Onion; Other; and Trazodone and nefazodone   Review of Systems Review of Systems  Constitutional: Negative for fever.  HENT: Negative for sore throat.   Eyes: Negative for visual disturbance.  Respiratory: Negative for cough and shortness of breath.   Cardiovascular: Negative for chest pain.  Gastrointestinal: Positive for abdominal pain (groin pain). Negative for nausea and vomiting.  Genitourinary: Positive  for flank pain. Negative for difficulty urinating, dysuria, vaginal bleeding and vaginal discharge.  Musculoskeletal: Positive for arthralgias. Negative for back pain and neck pain.  Skin: Negative for rash.  Neurological: Negative for syncope and headaches.     Physical Exam Updated Vital Signs BP (!) 150/70 (BP Location: Right Arm)   Pulse 83   Temp 98.9 F (37.2 C) (Oral)   Resp 18   Ht 5' (1.524 m)   Wt 211 lb (95.7 kg)   SpO2 97%   BMI 41.21 kg/m   Physical Exam  Constitutional: She is oriented to person, place, and time. She appears well-developed and well-nourished. No distress.  HENT:  Head: Normocephalic and atraumatic.  Eyes: Conjunctivae and EOM are normal.  Neck: Normal range of motion.  Cardiovascular: Normal rate, regular rhythm, normal heart sounds and intact distal pulses.  Exam reveals no gallop and no friction rub.   No murmur heard. Normal pedal pulses  Pulmonary/Chest: Effort normal and breath sounds normal. No respiratory distress. She has no wheezes. She has no rales.  Abdominal: Soft. She exhibits no distension. There is tenderness (RLQ). There is CVA tenderness and tenderness at McBurney's point. There is no guarding and negative Murphy's sign.  Musculoskeletal: She exhibits no edema.  Right hip: She exhibits tenderness and bony tenderness. She exhibits normal range of motion and normal strength.       Lumbar back: She exhibits tenderness and bony tenderness.  Neurological: She is alert and oriented to person, place, and time. She has normal strength. No sensory deficit.  Skin: Skin is warm and dry. No rash noted. She is not diaphoretic. No erythema.  Nursing note and vitals reviewed.    ED Treatments / Results  Labs (all labs ordered are listed, but only abnormal results are displayed) Labs Reviewed  URINALYSIS, ROUTINE W REFLEX MICROSCOPIC - Abnormal; Notable for the following:       Result Value   Color, Urine STRAW (*)    All other  components within normal limits  CBC - Abnormal; Notable for the following:    WBC 12.4 (*)    Hemoglobin 10.4 (*)    HCT 33.9 (*)    MCH 25.4 (*)    RDW 17.2 (*)    All other components within normal limits  BASIC METABOLIC PANEL - Abnormal; Notable for the following:    Glucose, Bld 104 (*)    All other components within normal limits    EKG  EKG Interpretation None       Radiology Ct Abdomen Pelvis Wo Contrast  Result Date: 05/12/2016 CLINICAL DATA:  Acute onset of worsening right-sided hip and flank pain. Initial encounter. EXAM: CT ABDOMEN AND PELVIS WITHOUT CONTRAST TECHNIQUE: Multidetector CT imaging of the abdomen and pelvis was performed following the standard protocol without IV contrast. COMPARISON:  MRI of the lumbar spine performed 07/19/2014 FINDINGS: Lower chest: The visualized lung bases are grossly clear. The visualized portions of the mediastinum are unremarkable. Hepatobiliary: The liver is unremarkable in appearance. The patient is status post cholecystectomy, with clips noted at the gallbladder fossa. The common bile duct remains normal in caliber. Pancreas: The pancreas is within normal limits. Spleen: The spleen is unremarkable in appearance. Adrenals/Urinary Tract: The adrenal glands are unremarkable in appearance. Right renal cysts are noted. A 3 mm nonobstructing stone is noted at the interpole region of the right kidney. There is no evidence of hydronephrosis. No obstructing ureteral stones are identified. No perinephric stranding is seen. Stomach/Bowel: The stomach is unremarkable in appearance. The small bowel is within normal limits. The appendix is normal in caliber, without evidence of appendicitis. There is question of minimal inflammation along the ascending and proximal transverse colon, which may reflect a mild infectious or inflammatory process. Scattered diverticulosis is noted along the distal transverse, descending and sigmoid colon, without evidence of  diverticulitis. Vascular/Lymphatic: The abdominal aorta is unremarkable in appearance. The inferior vena cava is grossly unremarkable. No retroperitoneal lymphadenopathy is seen. No pelvic sidewall lymphadenopathy is identified. Reproductive: The bladder is mildly distended and grossly unremarkable. The patient is status post hysterectomy. No suspicious adnexal masses are seen. Other: No additional soft tissue abnormalities are seen. Musculoskeletal: No acute osseous abnormalities are identified. The patient's left hip arthroplasty is grossly unremarkable in appearance, though incompletely imaged. The patient is status post lumbar spinal fusion at L4-L5. The visualized musculature is unremarkable in appearance. IMPRESSION: 1. Question of minimal inflammation about the ascending and proximal transverse colon, which may reflect a mild infectious or inflammatory process. 2. Scattered diverticulosis along the distal transverse, descending and sigmoid colon, without evidence of diverticulitis. 3. Right renal cysts noted. 3 mm nonobstructing stone at the interpole region of the right kidney. Electronically Signed   By: Francoise Schaumann.D.  On: 05/12/2016 21:25    Procedures Procedures (including critical care time)  Medications Ordered in ED Medications  oxyCODONE-acetaminophen (PERCOCET/ROXICET) 5-325 MG per tablet 1 tablet (1 tablet Oral Given 05/12/16 2111)  cyclobenzaprine (FLEXERIL) tablet 10 mg (10 mg Oral Given 05/12/16 2111)     Initial Impression / Assessment and Plan / ED Course  I have reviewed the triage vital signs and the nursing notes.  Pertinent labs & imaging results that were available during my care of the patient were reviewed by me and considered in my medical decision making (see chart for details).    71 year old female with history of arthritis, hypertension, diabetes, hyperlipidemia, prior breast cancer presents with concern for right flank pain radiating to the right groin. It  urine shows no sign of infection. Patient was slight leukocytosis which appears chronic. No sign of renal dysfunction. No right upper quadrant tenderness on exam. Does have right lower quadrant tenderness as well as flank tenderness and pain with passive motion of the hip. Ordered CT abdomen and pelvis to evaluate for abnormalities. CT shows some mild colitis of the descending colon. Feel this may be responsible for her abdominal tenderness on exam and groin pain, however feel unlikely is a part of her pain with hip movements. There is no sign of occult fracture on CT. Patient is given a prescription for Bentyl for possible pain from colitis. Given the severity of pain that was described, which is likely secondary to arthritis in her right hip, with patient unable to take NSAIDs, discussed that we give short prescription for oxycodone. Discussed the dangers of oxycodone with patient in detail, recommendation to take other medications whenever possible. Reviewed patient in the drug database, she has prescriptions only for Valium from her PCP.  Recommend follow-up with PCP and orthopedic physician. Patient discharged in stable condition with understanding of reasons to return.   Final Clinical Impressions(s) / ED Diagnoses   Final diagnoses:  Right groin pain  Right flank pain  Colitis    New Prescriptions New Prescriptions   DICYCLOMINE (BENTYL) 20 MG TABLET    Take 1 tablet (20 mg total) by mouth 2 (two) times daily. For abdominal pain   OXYCODONE (ROXICODONE) 5 MG IMMEDIATE RELEASE TABLET    Take 1 tablet (5 mg total) by mouth every 4 (four) hours as needed for severe pain.     Gareth Morgan, MD 05/12/16 2243

## 2016-05-12 NOTE — ED Triage Notes (Signed)
Pt c/o 10/10 right flank pain radiating from to right lower back to her groin, pt states she is not able to walk as usually because her pain, denies fever, chills, nausea or urinary symptoms.

## 2016-05-13 ENCOUNTER — Other Ambulatory Visit: Payer: Self-pay | Admitting: Internal Medicine

## 2016-05-14 NOTE — Progress Notes (Signed)
Subjective:    Patient ID: [name], female   DOB: 71 y.o.   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

## 2016-05-19 ENCOUNTER — Telehealth: Payer: Self-pay | Admitting: *Deleted

## 2016-05-19 MED ORDER — DILTIAZEM HCL ER COATED BEADS 180 MG PO CP24: 180.0000 mg | ORAL_CAPSULE | Freq: Every day | ORAL | 0 refills | Status: AC

## 2016-05-19 MED ORDER — SAXAGLIPTIN-METFORMIN ER 5-1000 MG PO TB24: ORAL_TABLET | ORAL | 0 refills | Status: AC

## 2016-05-19 MED ORDER — TOPIRAMATE 100 MG PO TABS: 100.0000 mg | ORAL_TABLET | Freq: Every evening | ORAL | 0 refills | Status: AC

## 2016-05-19 MED ORDER — TOPIRAMATE 25 MG PO TABS: 25.0000 mg | ORAL_TABLET | Freq: Every morning | ORAL | 0 refills | Status: AC

## 2016-05-19 MED ORDER — GABAPENTIN 300 MG PO CAPS: ORAL_CAPSULE | ORAL | 0 refills | Status: AC

## 2016-05-19 MED ORDER — GLUCOSE BLOOD VI STRP: 1.0000 | ORAL_STRIP | Freq: Two times a day (BID) | 0 refills | Status: DC

## 2016-05-19 MED ORDER — ATORVASTATIN CALCIUM 40 MG PO TABS: 40.0000 mg | ORAL_TABLET | Freq: Every day | ORAL | 0 refills | Status: AC

## 2016-05-19 MED ORDER — ONETOUCH ULTRASOFT LANCETS MISC: 1.0000 | Freq: Two times a day (BID) | 0 refills | Status: DC

## 2016-05-19 MED ORDER — AMITRIPTYLINE HCL 50 MG PO TABS: 50.0000 mg | ORAL_TABLET | Freq: Every day | ORAL | 0 refills | Status: AC

## 2016-05-19 MED ORDER — METOCLOPRAMIDE HCL 10 MG PO TABS: 10.0000 mg | ORAL_TABLET | Freq: Three times a day (TID) | ORAL | 0 refills | Status: DC
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
## 2016-05-26 ENCOUNTER — Other Ambulatory Visit: Payer: Self-pay | Admitting: *Deleted

## 2016-05-26 MED ORDER — ACCU-CHEK AVIVA PLUS W/DEVICE KIT: PACK | 0 refills | Status: AC

## 2016-05-26 MED ORDER — ACCU-CHEK SOFTCLIX LANCETS MISC: 1.0000 | Freq: Two times a day (BID) | 5 refills | Status: AC

## 2016-05-26 MED ORDER — GLUCOSE BLOOD VI STRP: 1.0000 | ORAL_STRIP | Freq: Two times a day (BID) | 5 refills | Status: AC

## 2016-05-26 NOTE — Telephone Encounter (Signed)
Rec'd fax stating insurance will not cover One touch monitor w/supplies. Preferred BS monitor & supplies are Accu-chek Aviva plus. Updated med list sent rx for Accu-chek Aviva w/supplies...[name]

## 2016-06-01 DIAGNOSIS — J45998 Other asthma: Secondary | ICD-10-CM | POA: Diagnosis not present

## 2016-06-04 ENCOUNTER — Other Ambulatory Visit (INDEPENDENT_AMBULATORY_CARE_PROVIDER_SITE_OTHER): Payer: Self-pay | Admitting: Orthopaedic Surgery

## 2016-06-04 DIAGNOSIS — M1611 Unilateral primary osteoarthritis, right hip: Secondary | ICD-10-CM

## 2016-06-15 ENCOUNTER — Telehealth: Payer: Self-pay | Admitting: *Deleted

## 2016-06-15 MED ORDER — METOCLOPRAMIDE HCL 10 MG PO TABS: 10.0000 mg | ORAL_TABLET | Freq: Three times a day (TID) | ORAL | 0 refills | Status: AC

## 2016-06-15 NOTE — Telephone Encounter (Signed)
Rec'd call pt states pharmacy did not get rx for her Reglan. Inform pt per chart rx was sent to [facility] on 05/19/16 will resend electronically to requested pharmacy...Johny Chess

## 2016-06-17 ENCOUNTER — Encounter (HOSPITAL_COMMUNITY): Payer: Self-pay

## 2016-06-17 ENCOUNTER — Encounter (HOSPITAL_COMMUNITY)
Admission: RE | Admit: 2016-06-17 | Discharge: 2016-06-17 | Disposition: A | Discharge status: Home / Self Care | Payer: Medicare Other | Source: Ambulatory Visit | Attending: Orthopaedic Surgery | Admitting: Orthopaedic Surgery

## 2016-06-17 DIAGNOSIS — M1611 Unilateral primary osteoarthritis, right hip: Secondary | ICD-10-CM | POA: Diagnosis not present

## 2016-06-17 DIAGNOSIS — Z01818 Encounter for other preprocedural examination: Secondary | ICD-10-CM | POA: Insufficient documentation

## 2016-06-17 HISTORY — DX: Dyspnea, unspecified: R06.00

## 2016-06-17 HISTORY — DX: Headache: R51

## 2016-06-17 HISTORY — DX: Headache, unspecified: R51.9

## 2016-06-17 LAB — COMPREHENSIVE METABOLIC PANEL
ALBUMIN: 3.9 g/dL (ref 3.5–5.0)
ALT: 12 U/L — ABNORMAL LOW (ref 14–54)
ANION GAP: 8 (ref 5–15)
AST: 13 U/L — AB (ref 15–41)
Alkaline Phosphatase: 64 U/L (ref 38–126)
BUN: 6 mg/dL (ref 6–20)
CO2: 24 mmol/L (ref 22–32)
Calcium: 9.2 mg/dL (ref 8.9–10.3)
Chloride: 109 mmol/L (ref 101–111)
Creatinine, Ser: 0.84 mg/dL (ref 0.44–1.00)
GFR calc Af Amer: >60 mL/min (ref >60)
GFR calc non Af Amer: >60 mL/min (ref >60)
GLUCOSE: 109 mg/dL — AB (ref 65–99)
POTASSIUM: 3.6 mmol/L (ref 3.5–5.1)
SODIUM: 141 mmol/L (ref 135–145)
Total Bilirubin: 0.4 mg/dL (ref 0.3–1.2)
Total Protein: 7.1 g/dL (ref 6.5–8.1)

## 2016-06-17 LAB — GLUCOSE, CAPILLARY: Glucose-Capillary: 119 mg/dL — ABNORMAL HIGH (ref 65–99)

## 2016-06-17 LAB — CBC
HCT: 34.2 % — ABNORMAL LOW (ref 36.0–46.0)
Hemoglobin: 10.4 g/dL — ABNORMAL LOW (ref 12.0–15.0)
MCH: 25.1 pg — ABNORMAL LOW (ref 26.0–34.0)
MCHC: 30.4 g/dL (ref 30.0–36.0)
MCV: 82.6 fL (ref 78.0–100.0)
Platelets: 352 10*3/uL (ref 150–400)
RBC: 4.14 MIL/uL (ref 3.87–5.11)
RDW: 16.2 % — AB (ref 11.5–15.5)
WBC: 9 10*3/uL (ref 4.0–10.5)

## 2016-06-17 LAB — URINALYSIS, ROUTINE W REFLEX MICROSCOPIC
Bilirubin Urine: NEGATIVE
Glucose, UA: NEGATIVE mg/dL
Hgb urine dipstick: NEGATIVE
Ketones, ur: NEGATIVE mg/dL
LEUKOCYTES UA: NEGATIVE
NITRITE: NEGATIVE
Protein, ur: NEGATIVE mg/dL
SPECIFIC GRAVITY, URINE: 1.015 (ref 1.005–1.030)
pH: 6 (ref 5.0–8.0)

## 2016-06-17 LAB — PROTIME-INR
INR: 0.97
Prothrombin Time: 12.9 seconds (ref 11.4–15.2)

## 2016-06-17 LAB — SURGICAL PCR SCREEN
MRSA, PCR: NEGATIVE
STAPHYLOCOCCUS AUREUS: NEGATIVE

## 2016-06-17 NOTE — Progress Notes (Signed)
PCP: Dr. Pricilla Holm Cardiologist: Dr. Crissie Sickles Pt. Last dose of plavix 06-16-16. She stopped herself, states she did not get any instructions from Dr. Lorin Mercy.  Pt. On oxygen 2 liters/[place] when sleeping.  Fasting sugars 90-110.

## 2016-06-17 NOTE — Pre-Procedure Instructions (Addendum)
[name]  06/17/2016      [name], [name] [address] [name] [address] [name] [address] Phone: [phone] Fax: [name], [place] - [address] [name] [name] [address] Phone: [phone] Fax: [phone]    Your procedure is scheduled on Monday, June 4  Report to [name] Admitting at 10:30 A.M.  Call this number if you have problems the morning of surgery:  [phone]   Remember:  Do not eat food or drink liquids after midnight on Sun. June 3   Take these medicines the morning of surgery with A SIP OF WATER : tylenol or hydrocodone if needed,advair-bring to [name], eye drops, dicyclomine (bentyl), diltiazam (cardizem), gabapentin if needed, atrovent nasal spray, isosorbide (imdur), nitroglycerine if needed, proair if needed-bring to [name], topamax,valium if needed           Stop plavix per Dr. Lorin Mercy          Stop advil, motrin, aleve, ibuprofen, BC Powders, Goody's, vitamins/herbal medicines.     How to Manage Your Diabetes Before and After Surgery  Why is it important to control my blood sugar before and after surgery? . Improving blood sugar levels before and after surgery helps healing and can limit problems. . A way of improving blood sugar control is eating a healthy diet by: o  Eating less sugar and carbohydrates o  Increasing activity/exercise o  Talking with your doctor about reaching your blood sugar goals . High blood sugars (greater than 180 mg/dL) can raise your risk of infections and slow your recovery, so you will need to focus on controlling your diabetes during the weeks before surgery. . Make sure that the doctor who takes care of your diabetes knows about your planned surgery including the date and location.  How do I manage my blood sugar before surgery? . Check your blood sugar at  least 4 times a day, starting 2 days before surgery, to make sure that the level is not too high or low. o Check your blood sugar the morning of your surgery when you wake up and every 2 hours until you get to the [name] unit. . If your blood sugar is less than 70 mg/dL, you will need to treat for low blood sugar: o Do not take insulin. o Treat a low blood sugar (less than 70 mg/dL) with  cup of clear juice (cranberry or apple), 4 glucose tablets, OR glucose gel. o Recheck blood sugar in 15 minutes after treatment (to make sure it is greater than 70 mg/dL). If your blood sugar is not greater than 70 mg/dL on recheck, call [phone] for further instructions. . Report your blood sugar to the [name] nurse when you get to [name].  . If you are admitted to the [name] after surgery: o Your blood sugar will be checked by the staff and you will probably be given insulin after surgery (instead of oral diabetes medicines) to make sure you have good blood sugar levels. o The goal for blood sugar control after surgery is 80-180 mg/dL.        WHAT DO I DO ABOUT MY DIABETES MEDICATION?   Marland Kitchen Do not take oral diabetes medicines (pills) the morning of surgery.    The day before surgery do not take the evening dose of glimepiride     Do not  wear jewelry, make-up or nail polish.  Do not wear lotions, powders, or perfumes, or deoderant.  Do not shave 48 hours prior to surgery.  Men may shave face and neck.  Do not bring valuables to the [name].  [name] is not responsible for any belongings or valuables.  Contacts, dentures or bridgework may not be worn into surgery.  Leave your suitcase in the car.  After surgery it may be brought to your room.  For patients admitted to the [name], discharge time will be determined by your treatment team.  Patients discharged the day of surgery will not be allowed to [name] home.    Special instructions:   [name]- Preparing For  Surgery  Before surgery, you can play an important role. Because skin is not sterile, your skin needs to be as free of germs as possible. You can reduce the number of germs on your skin by washing with CHG (chlorahexidine gluconate) Soap before surgery.  CHG is an antiseptic cleaner which kills germs and bonds with the skin to continue killing germs even after washing.  Please do not use if you have an allergy to CHG or antibacterial soaps. If your skin becomes reddened/irritated stop using the CHG.  Do not shave (including legs and underarms) for at least 48 hours prior to first CHG shower. It is OK to shave your face.  Please follow these instructions carefully.   1. Shower the NIGHT BEFORE SURGERY and the MORNING OF SURGERY with CHG.   2. If you chose to wash your hair, wash your hair first as usual with your normal shampoo.  3. After you shampoo, rinse your hair and body thoroughly to remove the shampoo.  4. Use CHG as you would any other liquid soap. You can apply CHG directly to the skin and wash gently with a scrungie or a clean washcloth.   5. Apply the CHG Soap to your body ONLY FROM THE NECK DOWN.  Do not use on open wounds or open sores. Avoid contact with your eyes, ears, mouth and genitals (private parts). Wash genitals (private parts) with your normal soap.  6. Wash thoroughly, paying special attention to the area where your surgery will be performed.  7. Thoroughly rinse your body with warm water from the neck down.  8. DO NOT shower/wash with your normal soap after using and rinsing off the CHG Soap.  9. Pat yourself dry with a CLEAN TOWEL.   10. Wear CLEAN PAJAMAS   11. Place CLEAN SHEETS on your bed the night of your first shower and DO NOT SLEEP WITH PETS.    Day of Surgery: Do not apply any deodorants/lotions. Please wear clean clothes to the [name]surgery center.      Please read over the following fact sheets that you were given. Coughing and Deep  Breathing and Surgical Site Infection Prevention

## 2016-06-18 LAB — HEMOGLOBIN A1C
Hgb A1c MFr Bld: 5.6 % (ref 4.8–5.6)
Mean Plasma Glucose: 114 mg/dL

## 2016-06-18 MED ORDER — CEFAZOLIN SODIUM-DEXTROSE 2-4 GM/100ML-% IV SOLN
2.0000 g | INTRAVENOUS | Status: AC
  Administered 2016-06-21: 2 g via INTRAVENOUS
  Filled 2016-06-18: qty 100

## 2016-06-18 NOTE — Progress Notes (Signed)
Anesthesia Chart Review:  Pt is a 71 year old female scheduled for R total hip arthroplasty anterior approach on 06/21/2016 with Rodell Perna, M.D.  - PCP is Pricilla Holm, MD - Cardiologist is [name], MD  PMH includes:  - MI ([place]; by notes normal coronaries in 1998), coronary spasm (on imdur), PAF (identified on loop recorder 10/24/14; was on eliquis for about a month, not clear why it was stopped; no afib since then per loop recorder), syncope (unexplained in 2015, no episodes since; loop recorder removed 04/05/16), HTN, DM, hyperlipidemia, OSA, asthma, anemia, breast cancer, GERD.  - Uses 2L O2 at night. Former smoker. BMI 36.  - S/P L TKA 11/27/14. S/P L total hip revision 12/14/10.   Medications include: Advair, Lipitor, Plavix, Dexilant, diltiazem, glimepiride, Imdur, losartan, metoclopramide, albuterol, saxagliptin-metformin, Topamax. Pt took herself off plavix, last dose 06/16/16.   BP 113/67   Pulse 93   Temp 36.8 C   Resp 20   Ht 5' 4.5" (1.638 m)   Wt 211 lb (95.7 kg)   SpO2 99%   BMI 35.66 kg/m   Preoperative labs reviewed.  HbA1c 5.6, glucose 109.  EKG 01/05/16: Sinus tachycardia (102 bpm) with occasional PVCs. LAD. Possible anterior infarct, age undetermined.  Echo 11/29/14:  - Left ventricle: The cavity size was normal. Wall thickness was increased in a pattern of moderate LVH. Systolic function was normal. The estimated ejection fraction was in the range of 60% to 65%. Wall motion was normal; there were no regional wall motion abnormalities. Doppler parameters are consistent with abnormal left ventricular relaxation (grade 1 diastolic dysfunction).  Nuclear stress test 12/22/13:  1. No reversible ischemia or infarction. 2. Normal left ventricular wall motion. 3. Left ventricular ejection fraction 42% 4. Low-risk stress test findings  If no changes, I anticipate pt can proceed with surgery as scheduled.   Willeen Cass, FNP-BC [facility] Short Stay Surgical  Center/Anesthesiology Phone: [phone] 06/18/2016 10:32 AM

## 2016-06-21 ENCOUNTER — Inpatient Hospital Stay (HOSPITAL_COMMUNITY): Payer: Medicare Other | Admitting: Emergency Medicine

## 2016-06-21 ENCOUNTER — Inpatient Hospital Stay (HOSPITAL_COMMUNITY)
Admission: RE | Admit: 2016-06-21 | Discharge: 2016-06-24 | DRG: 470 | Disposition: A | Discharge status: Skilled Nursing Facility | Payer: Medicare Other | Source: Ambulatory Visit | Attending: Orthopaedic Surgery | Admitting: Orthopaedic Surgery

## 2016-06-21 ENCOUNTER — Inpatient Hospital Stay (HOSPITAL_COMMUNITY): Payer: Medicare Other | Admitting: Certified Registered"

## 2016-06-21 ENCOUNTER — Encounter (HOSPITAL_COMMUNITY)
Admission: RE | Disposition: A | Discharge status: Skilled Nursing Facility | Payer: Self-pay | Source: Ambulatory Visit | Attending: Orthopaedic Surgery

## 2016-06-21 ENCOUNTER — Inpatient Hospital Stay (HOSPITAL_COMMUNITY): Payer: Medicare Other

## 2016-06-21 DIAGNOSIS — J45909 Unspecified asthma, uncomplicated: Secondary | ICD-10-CM | POA: Diagnosis present

## 2016-06-21 DIAGNOSIS — K209 Esophagitis, unspecified: Secondary | ICD-10-CM | POA: Diagnosis not present

## 2016-06-21 DIAGNOSIS — E119 Type 2 diabetes mellitus without complications: Secondary | ICD-10-CM | POA: Diagnosis not present

## 2016-06-21 DIAGNOSIS — F329 Major depressive disorder, single episode, unspecified: Secondary | ICD-10-CM | POA: Diagnosis present

## 2016-06-21 DIAGNOSIS — E7800 Pure hypercholesterolemia, unspecified: Secondary | ICD-10-CM | POA: Diagnosis not present

## 2016-06-21 DIAGNOSIS — K5790 Diverticulosis of intestine, part unspecified, without perforation or abscess without bleeding: Secondary | ICD-10-CM | POA: Diagnosis not present

## 2016-06-21 DIAGNOSIS — Z471 Aftercare following joint replacement surgery: Secondary | ICD-10-CM | POA: Diagnosis not present

## 2016-06-21 DIAGNOSIS — Z96641 Presence of right artificial hip joint: Secondary | ICD-10-CM | POA: Diagnosis not present

## 2016-06-21 DIAGNOSIS — Z79899 Other long term (current) drug therapy: Secondary | ICD-10-CM | POA: Diagnosis not present

## 2016-06-21 DIAGNOSIS — Z96643 Presence of artificial hip joint, bilateral: Secondary | ICD-10-CM | POA: Diagnosis not present

## 2016-06-21 DIAGNOSIS — Z6835 Body mass index (BMI) 35.0-35.9, adult: Secondary | ICD-10-CM

## 2016-06-21 DIAGNOSIS — Z9981 Dependence on supplemental oxygen: Secondary | ICD-10-CM | POA: Diagnosis not present

## 2016-06-21 DIAGNOSIS — E669 Obesity, unspecified: Secondary | ICD-10-CM | POA: Diagnosis present

## 2016-06-21 DIAGNOSIS — Z09 Encounter for follow-up examination after completed treatment for conditions other than malignant neoplasm: Secondary | ICD-10-CM

## 2016-06-21 DIAGNOSIS — I10 Essential (primary) hypertension: Secondary | ICD-10-CM | POA: Diagnosis present

## 2016-06-21 DIAGNOSIS — Z7984 Long term (current) use of oral hypoglycemic drugs: Secondary | ICD-10-CM

## 2016-06-21 DIAGNOSIS — G43909 Migraine, unspecified, not intractable, without status migrainosus: Secondary | ICD-10-CM | POA: Diagnosis not present

## 2016-06-21 DIAGNOSIS — M4310 Spondylolisthesis, site unspecified: Secondary | ICD-10-CM | POA: Diagnosis not present

## 2016-06-21 DIAGNOSIS — D649 Anemia, unspecified: Secondary | ICD-10-CM | POA: Diagnosis not present

## 2016-06-21 DIAGNOSIS — G4733 Obstructive sleep apnea (adult) (pediatric): Secondary | ICD-10-CM | POA: Diagnosis present

## 2016-06-21 DIAGNOSIS — Z419 Encounter for procedure for purposes other than remedying health state, unspecified: Secondary | ICD-10-CM

## 2016-06-21 DIAGNOSIS — K648 Other hemorrhoids: Secondary | ICD-10-CM | POA: Diagnosis not present

## 2016-06-21 DIAGNOSIS — I4891 Unspecified atrial fibrillation: Secondary | ICD-10-CM | POA: Diagnosis not present

## 2016-06-21 DIAGNOSIS — Z7902 Long term (current) use of antithrombotics/antiplatelets: Secondary | ICD-10-CM

## 2016-06-21 DIAGNOSIS — Z96652 Presence of left artificial knee joint: Secondary | ICD-10-CM | POA: Diagnosis present

## 2016-06-21 DIAGNOSIS — Z853 Personal history of malignant neoplasm of breast: Secondary | ICD-10-CM

## 2016-06-21 DIAGNOSIS — I252 Old myocardial infarction: Secondary | ICD-10-CM | POA: Diagnosis not present

## 2016-06-21 DIAGNOSIS — M25551 Pain in right hip: Secondary | ICD-10-CM | POA: Diagnosis present

## 2016-06-21 DIAGNOSIS — M25559 Pain in unspecified hip: Secondary | ICD-10-CM | POA: Diagnosis not present

## 2016-06-21 DIAGNOSIS — K219 Gastro-esophageal reflux disease without esophagitis: Secondary | ICD-10-CM | POA: Diagnosis not present

## 2016-06-21 DIAGNOSIS — I480 Paroxysmal atrial fibrillation: Secondary | ICD-10-CM | POA: Diagnosis present

## 2016-06-21 DIAGNOSIS — Z87891 Personal history of nicotine dependence: Secondary | ICD-10-CM | POA: Diagnosis not present

## 2016-06-21 DIAGNOSIS — I129 Hypertensive chronic kidney disease with stage 1 through stage 4 chronic kidney disease, or unspecified chronic kidney disease: Secondary | ICD-10-CM | POA: Diagnosis not present

## 2016-06-21 DIAGNOSIS — M1611 Unilateral primary osteoarthritis, right hip: Secondary | ICD-10-CM | POA: Diagnosis not present

## 2016-06-21 DIAGNOSIS — K449 Diaphragmatic hernia without obstruction or gangrene: Secondary | ICD-10-CM | POA: Diagnosis not present

## 2016-06-21 DIAGNOSIS — M1990 Unspecified osteoarthritis, unspecified site: Secondary | ICD-10-CM | POA: Diagnosis not present

## 2016-06-21 DIAGNOSIS — R55 Syncope and collapse: Secondary | ICD-10-CM | POA: Diagnosis not present

## 2016-06-21 HISTORY — PX: TOTAL HIP ARTHROPLASTY: SHX124

## 2016-06-21 LAB — GLUCOSE, CAPILLARY
GLUCOSE-CAPILLARY: 119 mg/dL — AB (ref 65–99)
GLUCOSE-CAPILLARY: 175 mg/dL — AB (ref 65–99)
Glucose-Capillary: 178 mg/dL — ABNORMAL HIGH (ref 65–99)
Glucose-Capillary: 221 mg/dL — ABNORMAL HIGH (ref 65–99)

## 2016-06-21 SURGERY — ARTHROPLASTY, HIP, TOTAL, ANTERIOR APPROACH
Anesthesia: General | Site: Hip | Laterality: Right

## 2016-06-21 MED ORDER — LOSARTAN POTASSIUM 50 MG PO TABS
100.0000 mg | ORAL_TABLET | Freq: Every morning | ORAL | Status: DC
  Administered 2016-06-22 – 2016-06-24 (×3): 100 mg via ORAL
  Filled 2016-06-21 (×3): qty 2

## 2016-06-21 MED ORDER — DICYCLOMINE HCL 20 MG PO TABS
20.0000 mg | ORAL_TABLET | Freq: Two times a day (BID) | ORAL | Status: DC
  Administered 2016-06-21 – 2016-06-24 (×6): 20 mg via ORAL
  Filled 2016-06-21 (×6): qty 1

## 2016-06-21 MED ORDER — TOPIRAMATE 25 MG PO TABS
25.0000 mg | ORAL_TABLET | Freq: Every morning | ORAL | Status: DC
  Administered 2016-06-22 – 2016-06-24 (×3): 25 mg via ORAL
  Filled 2016-06-21 (×3): qty 1

## 2016-06-21 MED ORDER — METHOCARBAMOL 1000 MG/10ML IJ SOLN
500.0000 mg | Freq: Four times a day (QID) | INTRAVENOUS | Status: DC | PRN
  Filled 2016-06-21: qty 5

## 2016-06-21 MED ORDER — PROPOFOL 10 MG/ML IV BOLUS
INTRAVENOUS | Status: AC
  Filled 2016-06-21: qty 20

## 2016-06-21 MED ORDER — MOMETASONE FURO-FORMOTEROL FUM 200-5 MCG/ACT IN AERO
2.0000 | INHALATION_SPRAY | Freq: Two times a day (BID) | RESPIRATORY_TRACT | Status: DC
  Administered 2016-06-21 – 2016-06-24 (×6): 2 via RESPIRATORY_TRACT
  Filled 2016-06-21 (×2): qty 8.8

## 2016-06-21 MED ORDER — FENTANYL CITRATE (PF) 100 MCG/2ML IJ SOLN
INTRAMUSCULAR | Status: DC | PRN
Start: 2016-06-21 — End: 2016-06-21
  Administered 2016-06-21 (×2): 50 ug via INTRAVENOUS
  Administered 2016-06-21: 150 ug via INTRAVENOUS

## 2016-06-21 MED ORDER — ATORVASTATIN CALCIUM 40 MG PO TABS
40.0000 mg | ORAL_TABLET | Freq: Every day | ORAL | Status: DC
  Administered 2016-06-21 – 2016-06-23 (×3): 40 mg via ORAL
  Filled 2016-06-21 (×3): qty 1

## 2016-06-21 MED ORDER — DOCUSATE SODIUM 100 MG PO CAPS
100.0000 mg | ORAL_CAPSULE | Freq: Two times a day (BID) | ORAL | Status: DC
  Administered 2016-06-21 – 2016-06-24 (×6): 100 mg via ORAL
  Filled 2016-06-21 (×6): qty 1

## 2016-06-21 MED ORDER — HYDROMORPHONE HCL 1 MG/ML IJ SOLN
INTRAMUSCULAR | Status: AC
  Filled 2016-06-21: qty 0.5

## 2016-06-21 MED ORDER — DEXAMETHASONE SODIUM PHOSPHATE 10 MG/ML IJ SOLN
INTRAMUSCULAR | Status: DC | PRN
  Administered 2016-06-21: 5 mg via INTRAVENOUS

## 2016-06-21 MED ORDER — HYDROMORPHONE HCL 1 MG/ML IJ SOLN
0.2500 mg | INTRAMUSCULAR | Status: DC | PRN
  Administered 2016-06-21 (×2): 0.5 mg via INTRAVENOUS

## 2016-06-21 MED ORDER — OXYCODONE HCL 5 MG PO TABS
ORAL_TABLET | ORAL | Status: AC
  Filled 2016-06-21: qty 1

## 2016-06-21 MED ORDER — LIDOCAINE HCL (CARDIAC) 20 MG/ML IV SOLN
INTRAVENOUS | Status: DC | PRN
  Administered 2016-06-21: 60 mg via INTRAVENOUS

## 2016-06-21 MED ORDER — MIDAZOLAM HCL 2 MG/2ML IJ SOLN
INTRAMUSCULAR | Status: DC | PRN
  Administered 2016-06-21: 2 mg via INTRAVENOUS

## 2016-06-21 MED ORDER — PANTOPRAZOLE SODIUM 40 MG PO TBEC
40.0000 mg | DELAYED_RELEASE_TABLET | Freq: Every day | ORAL | Status: DC
  Administered 2016-06-21 – 2016-06-24 (×4): 40 mg via ORAL
  Filled 2016-06-21 (×4): qty 1

## 2016-06-21 MED ORDER — LACTATED RINGERS IV SOLN
INTRAVENOUS | Status: DC
  Administered 2016-06-21: 50 mL/h via INTRAVENOUS
  Administered 2016-06-21: 14:00:00 via INTRAVENOUS

## 2016-06-21 MED ORDER — DILTIAZEM HCL ER COATED BEADS 180 MG PO CP24
180.0000 mg | ORAL_CAPSULE | Freq: Every day | ORAL | Status: DC
  Administered 2016-06-22 – 2016-06-24 (×3): 180 mg via ORAL
  Filled 2016-06-21 (×3): qty 1

## 2016-06-21 MED ORDER — SODIUM CHLORIDE 0.9 % IV SOLN
INTRAVENOUS | Status: DC
  Administered 2016-06-21: 17:00:00 via INTRAVENOUS

## 2016-06-21 MED ORDER — POLYETHYLENE GLYCOL 3350 17 G PO PACK: 17.0000 g | PACK | Freq: Every day | ORAL | Status: DC | PRN

## 2016-06-21 MED ORDER — PHENYLEPHRINE HCL 10 MG/ML IJ SOLN
INTRAMUSCULAR | Status: DC | PRN
  Administered 2016-06-21: 120 ug via INTRAVENOUS
  Administered 2016-06-21: 40 ug via INTRAVENOUS
  Administered 2016-06-21: 80 ug via INTRAVENOUS
  Administered 2016-06-21: 40 ug via INTRAVENOUS
  Administered 2016-06-21: 80 ug via INTRAVENOUS
  Administered 2016-06-21: 40 ug via INTRAVENOUS

## 2016-06-21 MED ORDER — METFORMIN HCL 500 MG PO TABS
1000.0000 mg | ORAL_TABLET | Freq: Every day | ORAL | Status: DC
  Administered 2016-06-21 – 2016-06-23 (×3): 1000 mg via ORAL
  Filled 2016-06-21 (×3): qty 2

## 2016-06-21 MED ORDER — TOPIRAMATE 100 MG PO TABS
100.0000 mg | ORAL_TABLET | Freq: Every evening | ORAL | Status: DC
  Administered 2016-06-21 – 2016-06-23 (×3): 100 mg via ORAL
  Filled 2016-06-21 (×3): qty 1

## 2016-06-21 MED ORDER — SAXAGLIPTIN-METFORMIN ER 5-1000 MG PO TB24: ORAL_TABLET | Freq: Every day | ORAL | Status: DC

## 2016-06-21 MED ORDER — ONDANSETRON HCL 4 MG/2ML IJ SOLN
INTRAMUSCULAR | Status: DC | PRN
  Administered 2016-06-21: 4 mg via INTRAVENOUS

## 2016-06-21 MED ORDER — GABAPENTIN 600 MG PO TABS
600.0000 mg | ORAL_TABLET | Freq: Every day | ORAL | Status: DC
  Administered 2016-06-21 – 2016-06-23 (×3): 600 mg via ORAL
  Filled 2016-06-21 (×3): qty 1

## 2016-06-21 MED ORDER — BUPIVACAINE HCL (PF) 0.25 % IJ SOLN
INTRAMUSCULAR | Status: AC
  Filled 2016-06-21: qty 30

## 2016-06-21 MED ORDER — IPRATROPIUM BROMIDE 0.06 % NA SOLN
2.0000 | Freq: Four times a day (QID) | NASAL | Status: DC
  Administered 2016-06-21 – 2016-06-24 (×9): 2 via NASAL
  Filled 2016-06-21: qty 15

## 2016-06-21 MED ORDER — POLYVINYL ALCOHOL 1.4 % OP SOLN
1.0000 [drp] | OPHTHALMIC | Status: DC | PRN
  Filled 2016-06-21: qty 15

## 2016-06-21 MED ORDER — PHENYLEPHRINE 40 MCG/ML (10ML) SYRINGE FOR IV PUSH (FOR BLOOD PRESSURE SUPPORT)
PREFILLED_SYRINGE | INTRAVENOUS | Status: AC
  Filled 2016-06-21: qty 10

## 2016-06-21 MED ORDER — HYDROMORPHONE HCL 1 MG/ML IJ SOLN
0.5000 mg | INTRAMUSCULAR | Status: DC | PRN
  Administered 2016-06-22 – 2016-06-24 (×2): 1 mg via INTRAVENOUS
  Filled 2016-06-21 (×3): qty 1

## 2016-06-21 MED ORDER — DIPHENHYDRAMINE HCL 25 MG PO CAPS
25.0000 mg | ORAL_CAPSULE | Freq: Three times a day (TID) | ORAL | Status: DC | PRN
  Administered 2016-06-21: 25 mg via ORAL
  Filled 2016-06-21: qty 1

## 2016-06-21 MED ORDER — MIRABEGRON ER 25 MG PO TB24
25.0000 mg | ORAL_TABLET | Freq: Every evening | ORAL | Status: DC
  Administered 2016-06-21 – 2016-06-23 (×3): 25 mg via ORAL
  Filled 2016-06-21 (×3): qty 1

## 2016-06-21 MED ORDER — ROCURONIUM BROMIDE 10 MG/ML (PF) SYRINGE
PREFILLED_SYRINGE | INTRAVENOUS | Status: AC
  Filled 2016-06-21: qty 5

## 2016-06-21 MED ORDER — MENTHOL 3 MG MT LOZG: 1.0000 | LOZENGE | OROMUCOSAL | Status: DC | PRN

## 2016-06-21 MED ORDER — ISOSORBIDE MONONITRATE ER 60 MG PO TB24
60.0000 mg | ORAL_TABLET | Freq: Every day | ORAL | Status: DC
  Administered 2016-06-22 – 2016-06-24 (×3): 60 mg via ORAL
  Filled 2016-06-21 (×3): qty 1

## 2016-06-21 MED ORDER — SUGAMMADEX SODIUM 200 MG/2ML IV SOLN
INTRAVENOUS | Status: DC | PRN
  Administered 2016-06-21: 200 mg via INTRAVENOUS

## 2016-06-21 MED ORDER — 0.9 % SODIUM CHLORIDE (POUR BTL) OPTIME
TOPICAL | Status: DC | PRN
  Administered 2016-06-21: 1000 mL

## 2016-06-21 MED ORDER — PHENOL 1.4 % MT LIQD: 1.0000 | OROMUCOSAL | Status: DC | PRN

## 2016-06-21 MED ORDER — SUGAMMADEX SODIUM 200 MG/2ML IV SOLN
INTRAVENOUS | Status: AC
  Filled 2016-06-21: qty 2

## 2016-06-21 MED ORDER — BUPIVACAINE-EPINEPHRINE 0.25% -1:200000 IJ SOLN
INTRAMUSCULAR | Status: DC | PRN
  Administered 2016-06-21: 20 mL

## 2016-06-21 MED ORDER — OLOPATADINE HCL 0.1 % OP SOLN
1.0000 [drp] | Freq: Two times a day (BID) | OPHTHALMIC | Status: DC
  Administered 2016-06-21 – 2016-06-24 (×6): 1 [drp] via OPHTHALMIC
  Filled 2016-06-21: qty 5

## 2016-06-21 MED ORDER — AMITRIPTYLINE HCL 50 MG PO TABS
50.0000 mg | ORAL_TABLET | Freq: Every day | ORAL | Status: DC
  Administered 2016-06-21 – 2016-06-23 (×3): 50 mg via ORAL
  Filled 2016-06-21 (×3): qty 1

## 2016-06-21 MED ORDER — LINAGLIPTIN 5 MG PO TABS
5.0000 mg | ORAL_TABLET | Freq: Every day | ORAL | Status: DC
  Administered 2016-06-21 – 2016-06-23 (×3): 5 mg via ORAL
  Filled 2016-06-21 (×3): qty 1

## 2016-06-21 MED ORDER — METOCLOPRAMIDE HCL 10 MG PO TABS
10.0000 mg | ORAL_TABLET | Freq: Three times a day (TID) | ORAL | Status: DC
  Administered 2016-06-21 – 2016-06-24 (×11): 10 mg via ORAL
  Filled 2016-06-21 (×7): qty 1
  Filled 2016-06-21: qty 2
  Filled 2016-06-21 (×3): qty 1

## 2016-06-21 MED ORDER — ONDANSETRON HCL 4 MG/2ML IJ SOLN
INTRAMUSCULAR | Status: AC
  Filled 2016-06-21: qty 2

## 2016-06-21 MED ORDER — FENTANYL CITRATE (PF) 250 MCG/5ML IJ SOLN
INTRAMUSCULAR | Status: AC
  Filled 2016-06-21: qty 5

## 2016-06-21 MED ORDER — ONDANSETRON HCL 4 MG PO TABS: 4.0000 mg | ORAL_TABLET | Freq: Four times a day (QID) | ORAL | Status: DC | PRN

## 2016-06-21 MED ORDER — CHLORHEXIDINE GLUCONATE 4 % EX LIQD: 60.0000 mL | Freq: Once | CUTANEOUS | Status: DC

## 2016-06-21 MED ORDER — PROPOFOL 10 MG/ML IV BOLUS
INTRAVENOUS | Status: DC | PRN
  Administered 2016-06-21: 150 mg via INTRAVENOUS

## 2016-06-21 MED ORDER — ACETAMINOPHEN 325 MG PO TABS
650.0000 mg | ORAL_TABLET | Freq: Four times a day (QID) | ORAL | Status: DC | PRN
  Administered 2016-06-23 – 2016-06-24 (×3): 650 mg via ORAL
  Filled 2016-06-21 (×3): qty 2

## 2016-06-21 MED ORDER — CLOPIDOGREL BISULFATE 75 MG PO TABS
75.0000 mg | ORAL_TABLET | Freq: Every day | ORAL | Status: DC
  Administered 2016-06-22 – 2016-06-24 (×3): 75 mg via ORAL
  Filled 2016-06-21 (×3): qty 1

## 2016-06-21 MED ORDER — INSULIN ASPART 100 UNIT/ML ~~LOC~~ SOLN
0.0000 [IU] | Freq: Three times a day (TID) | SUBCUTANEOUS | Status: DC
  Administered 2016-06-21: 5 [IU] via SUBCUTANEOUS
  Administered 2016-06-22: 3 [IU] via SUBCUTANEOUS
  Administered 2016-06-22 – 2016-06-24 (×3): 2 [IU] via SUBCUTANEOUS

## 2016-06-21 MED ORDER — MIDAZOLAM HCL 2 MG/2ML IJ SOLN
INTRAMUSCULAR | Status: AC
  Filled 2016-06-21: qty 2

## 2016-06-21 MED ORDER — OXYCODONE HCL 5 MG PO TABS
5.0000 mg | ORAL_TABLET | ORAL | Status: DC | PRN
  Administered 2016-06-21 (×2): 10 mg via ORAL
  Administered 2016-06-21: 5 mg via ORAL
  Administered 2016-06-22 (×5): 10 mg via ORAL
  Administered 2016-06-23: 5 mg via ORAL
  Administered 2016-06-23 – 2016-06-24 (×2): 10 mg via ORAL
  Filled 2016-06-21: qty 1
  Filled 2016-06-21 (×9): qty 2

## 2016-06-21 MED ORDER — CYCLOSPORINE 0.05 % OP EMUL
1.0000 [drp] | Freq: Two times a day (BID) | OPHTHALMIC | Status: DC
  Administered 2016-06-21 – 2016-06-24 (×6): 1 [drp] via OPHTHALMIC
  Filled 2016-06-21 (×7): qty 1

## 2016-06-21 MED ORDER — ASPIRIN EC 325 MG PO TBEC
325.0000 mg | DELAYED_RELEASE_TABLET | Freq: Every day | ORAL | Status: DC
  Filled 2016-06-21 (×2): qty 1

## 2016-06-21 MED ORDER — EPHEDRINE SULFATE 50 MG/ML IJ SOLN
INTRAMUSCULAR | Status: DC | PRN
Start: 2016-06-21 — End: 2016-06-21
  Administered 2016-06-21: 10 mg via INTRAVENOUS

## 2016-06-21 MED ORDER — VITAMIN D 1000 UNITS PO TABS
2000.0000 [IU] | ORAL_TABLET | Freq: Every day | ORAL | Status: DC
  Administered 2016-06-22 – 2016-06-24 (×3): 2000 [IU] via ORAL
  Filled 2016-06-21 (×3): qty 2

## 2016-06-21 MED ORDER — NITROGLYCERIN 0.4 MG/SPRAY TL SOLN
1.0000 | Status: DC | PRN
  Filled 2016-06-21: qty 4.9

## 2016-06-21 MED ORDER — METHOCARBAMOL 500 MG PO TABS
500.0000 mg | ORAL_TABLET | Freq: Four times a day (QID) | ORAL | Status: DC | PRN
  Administered 2016-06-21 – 2016-06-24 (×6): 500 mg via ORAL
  Filled 2016-06-21 (×6): qty 1

## 2016-06-21 MED ORDER — ROCURONIUM BROMIDE 100 MG/10ML IV SOLN
INTRAVENOUS | Status: DC | PRN
  Administered 2016-06-21: 10 mg via INTRAVENOUS
  Administered 2016-06-21: 70 mg via INTRAVENOUS

## 2016-06-21 MED ORDER — ONDANSETRON HCL 4 MG/2ML IJ SOLN: 4.0000 mg | Freq: Four times a day (QID) | INTRAMUSCULAR | Status: DC | PRN

## 2016-06-21 MED ORDER — ACETAMINOPHEN 650 MG RE SUPP: 650.0000 mg | Freq: Four times a day (QID) | RECTAL | Status: DC | PRN

## 2016-06-21 MED ORDER — METHOCARBAMOL 500 MG PO TABS
ORAL_TABLET | ORAL | Status: AC
  Administered 2016-06-21: 500 mg
  Filled 2016-06-21: qty 1

## 2016-06-21 MED ORDER — HYDROMORPHONE HCL 1 MG/ML IJ SOLN
INTRAMUSCULAR | Status: AC
Start: 2016-06-21 — End: 2016-06-22
  Filled 2016-06-21: qty 0.5

## 2016-06-21 MED ORDER — METOCLOPRAMIDE HCL 5 MG PO TABS: 5.0000 mg | ORAL_TABLET | Freq: Three times a day (TID) | ORAL | Status: DC | PRN

## 2016-06-21 MED ORDER — VANCOMYCIN HCL IN DEXTROSE 1-5 GM/200ML-% IV SOLN
1000.0000 mg | Freq: Two times a day (BID) | INTRAVENOUS | Status: AC
  Administered 2016-06-21: 1000 mg via INTRAVENOUS
  Filled 2016-06-21: qty 200

## 2016-06-21 MED ORDER — METOCLOPRAMIDE HCL 5 MG/ML IJ SOLN: 5.0000 mg | Freq: Three times a day (TID) | INTRAMUSCULAR | Status: DC | PRN

## 2016-06-21 MED ORDER — LIDOCAINE 2% (20 MG/ML) 5 ML SYRINGE
INTRAMUSCULAR | Status: AC
  Filled 2016-06-21: qty 5

## 2016-06-21 MED ORDER — ALBUTEROL SULFATE (2.5 MG/3ML) 0.083% IN NEBU: 3.0000 mL | INHALATION_SOLUTION | Freq: Four times a day (QID) | RESPIRATORY_TRACT | Status: DC | PRN

## 2016-06-21 MED ORDER — GLIMEPIRIDE 4 MG PO TABS
2.0000 mg | ORAL_TABLET | Freq: Two times a day (BID) | ORAL | Status: DC
  Administered 2016-06-21 – 2016-06-24 (×6): 2 mg via ORAL
  Filled 2016-06-21 (×6): qty 1

## 2016-06-21 SURGICAL SUPPLY — 50 items
BENZOIN TINCTURE PRP APPL 2/3 (GAUZE/BANDAGES/DRESSINGS) ×3 IMPLANT
BLADE CLIPPER SURG (BLADE) ×3 IMPLANT
BLADE SAW SGTL 18X1.27X75 (BLADE) ×2 IMPLANT
BLADE SAW SGTL 18X1.27X75MM (BLADE) ×1
CAPT HIP TOTAL 2 ×3 IMPLANT
CELLS DAT CNTRL 66122 CELL SVR (MISCELLANEOUS) ×1 IMPLANT
CLOSURE WOUND 1/2 X4 (GAUZE/BANDAGES/DRESSINGS) ×1
COVER SURGICAL LIGHT HANDLE (MISCELLANEOUS) ×3 IMPLANT
DRAPE C-ARM 42X72 X-RAY (DRAPES) ×3 IMPLANT
DRAPE IMP U-DRAPE 54X76 (DRAPES) ×3 IMPLANT
DRAPE STERI IOBAN 125X83 (DRAPES) ×3 IMPLANT
DRAPE U-SHAPE 47X51 STRL (DRAPES) ×6 IMPLANT
DRSG MEPILEX BORDER 4X12 (GAUZE/BANDAGES/DRESSINGS) ×3 IMPLANT
DRSG MEPILEX BORDER 4X8 (GAUZE/BANDAGES/DRESSINGS) ×3 IMPLANT
DURAPREP 26ML APPLICATOR (WOUND CARE) ×3 IMPLANT
ELECT BLADE 4.0 EZ CLEAN MEGAD (MISCELLANEOUS)
ELECT CAUTERY BLADE 6.4 (BLADE) ×3 IMPLANT
ELECT REM PT RETURN 9FT ADLT (ELECTROSURGICAL) ×3
ELECTRODE BLDE 4.0 EZ CLN MEGD (MISCELLANEOUS) IMPLANT
ELECTRODE REM PT RTRN 9FT ADLT (ELECTROSURGICAL) ×1 IMPLANT
FACESHIELD WRAPAROUND (MASK) ×9 IMPLANT
GLOVE BIOGEL PI IND STRL 8 (GLOVE) ×2 IMPLANT
GLOVE BIOGEL PI INDICATOR 8 (GLOVE) ×4
GLOVE ORTHO TXT STRL SZ7.5 (GLOVE) ×6 IMPLANT
GOWN STRL REUS W/ TWL LRG LVL3 (GOWN DISPOSABLE) ×1 IMPLANT
GOWN STRL REUS W/ TWL XL LVL3 (GOWN DISPOSABLE) ×1 IMPLANT
GOWN STRL REUS W/TWL 2XL LVL3 (GOWN DISPOSABLE) ×3 IMPLANT
GOWN STRL REUS W/TWL LRG LVL3 (GOWN DISPOSABLE) ×2
GOWN STRL REUS W/TWL XL LVL3 (GOWN DISPOSABLE) ×2
KIT BASIN OR (CUSTOM PROCEDURE TRAY) ×3 IMPLANT
KIT ROOM TURNOVER OR (KITS) ×3 IMPLANT
MANIFOLD NEPTUNE II (INSTRUMENTS) ×3 IMPLANT
NS IRRIG 1000ML POUR BTL (IV SOLUTION) ×3 IMPLANT
PACK TOTAL JOINT (CUSTOM PROCEDURE TRAY) ×3 IMPLANT
PACK UNIVERSAL I (CUSTOM PROCEDURE TRAY) ×3 IMPLANT
PAD ARMBOARD 7.5X6 YLW CONV (MISCELLANEOUS) ×6 IMPLANT
RTRCTR WOUND ALEXIS 18CM MED (MISCELLANEOUS) ×3
STAPLER VISISTAT 35W (STAPLE) ×3 IMPLANT
STRIP CLOSURE SKIN 1/2X4 (GAUZE/BANDAGES/DRESSINGS) ×2 IMPLANT
SUT VIC AB 0 CT1 27 (SUTURE) ×2
SUT VIC AB 0 CT1 27XBRD ANBCTR (SUTURE) ×1 IMPLANT
SUT VIC AB 2-0 CT1 27 (SUTURE) ×2
SUT VIC AB 2-0 CT1 TAPERPNT 27 (SUTURE) ×1 IMPLANT
SUT VICRYL 4-0 PS2 18IN ABS (SUTURE) ×3 IMPLANT
SUT VLOC 180 0 24IN GS25 (SUTURE) ×3 IMPLANT
TOWEL OR 17X24 6PK STRL BLUE (TOWEL DISPOSABLE) ×3 IMPLANT
TOWEL OR 17X26 10 PK STRL BLUE (TOWEL DISPOSABLE) ×6 IMPLANT
TRAY CATH 16FR W/PLASTIC CATH (SET/KITS/TRAYS/PACK) IMPLANT
TRAY FOLEY W/METER SILVER 16FR (SET/KITS/TRAYS/PACK) ×3 IMPLANT
WATER STERILE IRR 1000ML POUR (IV SOLUTION) ×6 IMPLANT

## 2016-06-21 NOTE — Anesthesia Procedure Notes (Addendum)
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 ) Grade View: Grade I Tube type: Oral Tube size: 7.5 mm Number of attempts: 1 Airway Equipment and Method: Stylet and Oral airway Placement Confirmation: ETT inserted through vocal cords under direct vision,  positive ETCO2 and breath sounds checked- equal and bilateral Secured at: 21 cm Tube secured with: Tape Dental Injury: Teeth and Oropharynx as per pre-operative assessment

## 2016-06-21 NOTE — Transfer of Care (Signed)
Immediate Anesthesia Transfer of Care Note  Patient: [name]  Procedure(s) Performed: Procedure(s): RIGHT TOTAL HIP ARTHROPLASTY ANTERIOR APPROACH (Right)  Patient Location: PACU  Anesthesia Type:General  Level of Consciousness: awake, oriented and patient cooperative  Airway & Oxygen Therapy: Patient Spontanous Breathing and Patient connected to face mask oxygen  Post-op Assessment: Report given to RN, Post -op Vital signs reviewed and stable and Patient moving all extremities  Post vital signs: Reviewed and stable  Last Vitals:  Vitals:   06/21/16 1038 06/21/16 1504  BP: (!) 153/79   Pulse: 91   Resp: 20   Temp: 36.9 C (P) 36.6 C    Last Pain:  Vitals:   06/21/16 1038  TempSrc: Oral  PainSc:       Patients Stated Pain Goal: 7 (15/37/94 3276)  Complications: No apparent anesthesia complications

## 2016-06-21 NOTE — H&P (Signed)
TOTAL HIP ADMISSION H&P  Patient is admitted for right total hip arthroplasty.  Subjective:  Chief Complaint: right hip pain  HPI: [name], 71 y.o. female, has a history of pain and functional disability in the right hip(s) due to arthritis and patient has failed non-surgical conservative treatments for greater than 12 weeks to include NSAID's and/or analgesics, use of assistive devices and activity modification.  Onset of symptoms was gradual starting several years ago with gradually worsening course since that time. Patient has night pain, worsening of pain with activity and weight bearing, trendelenberg gait, pain that interfers with activities of daily living and pain with passive range of motion. Patient has evidence of subchondral sclerosis, periarticular osteophytes and joint space narrowing by imaging studies. This condition presents safety issues increasing the risk of falls.  There is no current active infection.  Patient Active Problem List   Diagnosis Date Noted  . Unilateral primary osteoarthritis, right hip 12/26/2015  . Atrial fibrillation ([place]) 01/07/2015  . Status post total left knee replacement 11/27/2014  . Insomnia due to medical condition 10/15/2014  . DDD (degenerative disc disease), lumbar 10/15/2014  . Primary osteoarthritis, left shoulder 10/15/2014  . Preventative health care 08/13/2014  . Asthma 05/17/2014  . Obstructive sleep apnea   . Other type of migraine   . DM (diabetes mellitus) type II controlled, neurological manifestation ([place])   . Dyslipidemia 11/12/2008  . Essential hypertension, benign 11/12/2008  . SYNCOPE 11/12/2008   Past Medical History:  Diagnosis Date  . Anemia   . Arthritis   . Asthma   . Benign neoplasm of colon 11/09/09   Colonoscopy 11/09/09 - FINDINGS: 1) 49m polp in transverse colon, resected & retrieved.  2) Diverticulosis in sigmoid & descending colon.  3) Medium-sized, internal hemorrhoids.  . Breast cancer [place] 1989    Left breast cancer  . Chronic kidney disease    states she has a cyst on her kidney  . CMC arthritis    Left middle finger  . Depression    40+ years  . Diabetes mellitus 10/2008   type 2  . Diverticulosis of colon (without mention of hemorrhage)    Colonoscopy 11/09/09 - FINDINGS: 1) 169mpolp in transverse colon, resected & retrieved.  2) Diverticulosis in sigmoid & descending colon.  3) Medium-sized, internal hemorrhoids.  . Marland Kitchenyspnea    when over exerting or when she gets too hot  . Dysrhythmia   . Esophagitis   . Essential hypertension, benign   . Facet arthropathy, lumbar (H[facility])    L3-4  . GERD (gastroesophageal reflux disease)   . Headache    migraines  . Hemorrhage of rectum and anus    Colonoscopy 11/09/09 - FINDINGS: 1) 1042molp in transverse colon, resected & retrieved.  2) Diverticulosis in sigmoid & descending colon.  3) Medium-sized, internal hemorrhoids.  . Hiatal hernia   . Hypercholesteremia   . Internal hemorrhoids without mention of complication    Colonoscopy 11/09/09 - FINDINGS: 1) 38m36mlp in transverse colon, resected & retrieved.  2) Diverticulosis in sigmoid & descending colon.  3) Medium-sized, internal hemorrhoids.  . Myocardial infarction ([id number] & [id number]   NM Myocar Multi W/Spect W/Wall Motion EF on 08/07/10 - IMPRESSION: 1) No evidence of ischemia.  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  3) Normal LV wall motion.  4) Estimated Q G S EF 70%, unchanged.  . Obese   . Obstructive sleep apnea  uses oxygen at night 2 liters at night  . Paroxysmal atrial fibrillation (HCC)    a identified on LINQ  . Spondylolisthesis   . Syncope    MDT LinQ implanted for evaluation    Past Surgical History:  Procedure Laterality Date  . ABDOMINAL HYSTERECTOMY  1978  . BACK SURGERY     x2 one for infection  . BREAST BIOPSY Left 1995   [facility] ([facility])  . BREAST EXCISIONAL BIOPSY  Left 1997  . CHOLECYSTECTOMY    . COLONOSCOPY    . COLONOSCOPY W/ POLYPECTOMY  11/09/09   FINDINGS: 1) 15m polp in transverse colon, resected & retrieved.  2) Diverticulosis in sigmoid & descending colon.  3) Medium-sized, internal hemorrhoids.  .Marland KitchenDILATION AND CURETTAGE OF UTERUS    . ESOPHAGOGASTRODUODENOSCOPY  02/15/07  . FOOT SURGERY Bilateral    Bil feet great toes and second toes spurs. By Dr. HMilinda Pointerin 2004.  .Marland KitchenHEMORRHOID SURGERY  2011   Internal & external hemorrhoidectomy (x) 3 quadrants  . JOINT REPLACEMENT     lsft hip x2, rt knee  . KNEE ARTHROPLASTY Left 11/27/2014   Procedure: LEFT TOTAL KNEE ARTHROPLASTY;  Surgeon: MMarybelle Killings MD;  [place]  Service: Orthopedics;  Laterality: Left;  . KNEE ARTHROSCOPY Right 2002   By Dr. YLorin Mercy . LOOP RECORDER IMPLANT  11/15/06; 04/02/2013   MDT Reveal implanted 10/2006 for syncope; removed with MDT LinQ implanted 03/2013 by Dr TLovena Le . LOOP RECORDER IMPLANT N/A 04/02/2013   Procedure: LOOP RECORDER IMPLANT;  Surgeon: GEvans Lance MD;  [facility]  Service: Cardiovascular;  Laterality: N/A;  . LOOP RECORDER REMOVAL N/A 04/05/2016   Procedure: Loop Recorder Removal;  Surgeon: GEvans Lance MD;  [facility];  Service: Cardiovascular;  Laterality: N/A;  . TOE SURGERY    . TONSILLECTOMY    . TOTAL HIP REVISION  12/14/2010   Procedure: TOTAL HIP REVISION;  Surgeon: MMarybelle Killings  [place]  Service: Orthopedics;  Laterality: Left;  Left Total Hip Arthroplasty Revision, Poly and Ball Exchange, Possible Acetabular Revision vs. Poly Exchange  . TOTAL KNEE ARTHROPLASTY Left 11/27/2014  . TRIGGER FINGER RELEASE Left 01/02/2013   Procedure: RELEASE LEFT MIDDLE TRIGGER FINGER/LEFT INDEX AND LEFT RING FINGER;  Surgeon: GWynonia Sours MD;  [place]  Service: Orthopedics;  Laterality: Left;  ANESTHESIA: GENERAL/IV REGIONAL FAB    Prescriptions Prior to Admission  Medication Sig  Dispense Refill Last Dose  . acetaminophen (TYLENOL) 500 MG tablet Take 1,500 mg by mouth every 6 (six) hours as needed for moderate pain or headache.    06/20/2016 at Unknown time  . ADVAIR HFA 115-21 MCG/ACT inhaler INHALE 2 PUFFS INTO THE LUNGS TWO TIMES DAILY. 12 g 3 06/21/2016 at Unknown time  . amitriptyline (ELAVIL) 50 MG tablet Take 1 tablet (50 mg total) by mouth at bedtime. 30 tablet 0 06/20/2016 at Unknown time  . atorvastatin (LIPITOR) 40 MG tablet Take 1 tablet (40 mg total) by mouth daily at 6 PM. (Patient taking differently: Take 40 mg by mouth every evening. ) 30 tablet 0 06/20/2016 at Unknown time  . BIOTIN PO Take 1 tablet by mouth daily with breakfast.   06/20/2016 at Unknown time  . Cholecalciferol (VITAMIN D3) 2000 units capsule Take 2,000 Units by mouth daily after breakfast.   06/20/2016 at Unknown time  . clopidogrel (PLAVIX) 75 MG tablet TAKE ONE  TABLET BY MOUTH DAILY. 90 tablet 0 06/16/16  . cycloSPORINE (RESTASIS) 0.05 % ophthalmic emulsion Place 1 drop into both eyes 2 (two) times daily.    06/21/2016 at Unknown time  . DEXILANT 60 MG capsule TAKE 1 CAPSULE BY MOUTH ONCE DAILY (Patient taking differently: TAKE 1 CAPSULE BY MOUTH ONCE DAILY AT NOON) 90 capsule 2 06/20/2016 at Unknown time  . diazepam (VALIUM) 5 MG tablet TAKE 1 TABLET BY MOUTH TWICE DAILY FOR MUSCLE SPASM 60 tablet 0 06/20/2016 at Unknown time  . dicyclomine (BENTYL) 20 MG tablet Take 1 tablet (20 mg total) by mouth 2 (two) times daily. For abdominal pain 20 tablet 0 06/20/2016 at Unknown time  . diltiazem (CARDIZEM CD) 180 MG 24 hr capsule Take 1 capsule (180 mg total) by mouth daily. (Patient taking differently: Take 180 mg by mouth daily at 12 noon. ) 30 capsule 0 06/21/2016 at Unknown time  . diphenhydrAMINE (BENADRYL) 25 MG tablet Take 25 mg by mouth at bedtime. May take 21ms during the day as needed for itching   06/20/2016 at Unknown time  . FIBER SELECT GUMMIES PO Take 2 capsules by mouth daily.   06/20/2016 at Unknown time   . gabapentin (NEURONTIN) 300 MG capsule TAKE 2 CAPSULES BY MOUTH EVERY NIGHT AT BEDTIME BETWEEN 1 AND 2 HOURS PRIOR TO BEDTIME. YOU CAN TAKE ANOTHER 300MG IN DAYTIME FOR PAIN (Patient taking differently: Take 30101m at 5pm and 60034m1 to 2 hours before bedtime) 90 capsule 0 06/20/2016 at Unknown time  . GARLIC PO Take 1 tablet by mouth daily with breakfast.   06/19/16  . glimepiride (AMARYL) 2 MG tablet Take 2 mg by mouth 2 (two) times daily.   06/20/2016 at Unknown time  . HYDROcodone-acetaminophen (NORCO) 10-325 MG tablet Take 1 tablet by mouth 2 (two) times daily as needed for moderate pain.   Past Week at Unknown time  . Hypromellose (ARTIFICIAL TEARS OP) Apply 1 drop to eye 2 (two) times daily.   06/21/2016 at Unknown time  . ipratropium (ATROVENT) 0.06 % nasal spray INSTILL 2 SPRAYS IN EACH NOSTRIL FOUR TIMES A DAY 45 mL 1 06/21/2016 at Unknown time  . isosorbide mononitrate (IMDUR) 60 MG 24 hr tablet TAKE 1 TABLET BY MOUTH ONCE DAILY (Patient taking differently: TAKE 1 TABLET BY MOUTH ONCE DAILY AT NOON) 90 tablet 3 06/21/2016 at Unknown time  . losartan (COZAAR) 100 MG tablet TAKE 1 TABLET BY MOUTH EVERY MORNING 90 tablet 3 06/20/2016 at Unknown time  . metoCLOPramide (REGLAN) 10 MG tablet Take 1 tablet (10 mg total) by mouth 4 (four) times daily -  before meals and at bedtime. 120 tablet 0 06/20/2016 at Unknown time  . mirabegron ER (MYRBETRIQ) 25 MG TB24 tablet Take 25 mg by mouth every evening.   06/20/2016 at Unknown time  . Multiple Vitamin (MULTIVITAMIN WITH MINERALS) TABS tablet Take 1 tablet by mouth daily with breakfast.   Past Week at Unknown time  . nitroGLYCERIN (NITROLINGUAL) 0.4 MG/SPRAY spray Place 1 spray under the tongue every 5 (five) minutes as needed. Chest pains 12 g 1 Taking  . olopatadine (PATANOL) 0.1 % ophthalmic solution Insert 1 drop in both eyes twice daily  6 06/21/2016 at Unknown time  . PROAIR HFA 108 (90 Base) MCG/ACT inhaler INHALE 2 PUFFS BY MOUTH EVERY 6 HOURS FOR WHEEZING AND  SHORTNESS OF BREATH 17 each 1 06/21/2016 at Unknown time  . Saxagliptin-Metformin (KOMBIGLYZE XR) 05-998 MG TB24 TAKE 1 TABLET BY MOUTH EVERY  EVENING WITH A MEAL 30 tablet 0 06/20/2016 at Unknown time  . topiramate (TOPAMAX) 100 MG tablet Take 1 tablet (100 mg total) by mouth every evening. 30 tablet 0 06/20/2016 at Unknown time  . topiramate (TOPAMAX) 25 MG tablet Take 1 tablet (25 mg total) by mouth every morning. 30 tablet 0 06/21/2016 at Unknown time  . ACCU-CHEK SOFTCLIX LANCETS lancets 1 each by Other route 2 (two) times daily. Use to check blood sugars twice a day 100 each 5   . Alcohol Swabs (ALCOHOL PADS) 70 % PADS Use to help clean site to check blood sugars daily Dx E11.9 100 each 3 Taking  . azithromycin (ZITHROMAX Z-PAK) 250 MG tablet Take 2 tablets first day and then 1 tablet a day until finished (Patient not taking: Reported on 06/11/2016) 6 each 0 Completed Course at Unknown time  . Blood Glucose Monitoring Suppl (ACCU-CHEK AVIVA PLUS) w/Device KIT [place] as directed to check blood sugars daily 1 kit 0   . glucose blood (ACCU-CHEK AVIVA PLUS) test strip 1 each by Other route 2 (two) times daily. Use to check blood sugars twice a day 100 each 5   . oxyCODONE (ROXICODONE) 5 MG immediate release tablet Take 1 tablet (5 mg total) by mouth every 4 (four) hours as needed for severe pain. (Patient not taking: Reported on 06/11/2016) 10 tablet 0 Completed Course at Unknown time   Allergies  Allergen Reactions  . Contrast Media [Iodinated Diagnostic Agents] Anaphylaxis  . Gadolinium Anaphylaxis       . Iohexol Anaphylaxis  . Onion Other (See Comments)    Migraines   . Other Other (See Comments)    Aged cheddar - migraines  Smoked meat - migraines   . Trazodone And Nefazodone Other (See Comments)    Dizziness,lightheadness, heart felt funny  . Aspirin Nausea And Vomiting    TACHYCARDIA    Social History  Substance Use Topics  . Smoking status: Former Smoker    Types: Cigarettes    Quit date:  01/19/1983  . Smokeless tobacco: Never Used  . Alcohol use No    Family History  Problem Relation Age of Onset  . Pancreatic cancer Mother   . Heart attack Father   . Diabetes Son   . Arthritis Brother        Severe/Crippling  . Tuberculosis Brother   . Hypertension Brother        x 2  . Heart disease Sister   . Diabetes Brother   . Seizures Brother   . Autoimmune disease Brother        AIDS  . Colon cancer Brother   . Lung cancer Brother        mets     Review of Systems  Constitutional: Negative.   HENT: Negative.   Respiratory: Negative.   Cardiovascular: Negative.   Gastrointestinal: Negative.   Genitourinary: Negative.   Musculoskeletal: Positive for joint pain.  Neurological: Negative.   Psychiatric/Behavioral: Negative.     Objective:  Physical Exam  Constitutional: She is oriented to person, place, and time. She appears well-developed. No distress.  HENT:  Head: Normocephalic and atraumatic.  Eyes: EOM are normal. Pupils are equal, round, and reactive to light.  Neck: Normal range of motion.  Respiratory: She has no wheezes.  GI: She exhibits no distension.  Musculoskeletal:  Gait antalgic. Pain with hip internal/external rotation.   Neurological: She is alert and oriented to person, place, and time.  Skin: Skin is warm and dry.  Psychiatric: She has a normal mood and affect.    Vital signs in last 24 hours: Temp:  [98.5 F (36.9 C)] 98.5 F (36.9 C) (06/04 1038) Pulse Rate:  [91] 91 (06/04 1038) Resp:  [20] 20 (06/04 1038) BP: (153)/(79) 153/79 (06/04 1038) SpO2:  [98 %] 98 % (06/04 1038) Weight:  [211 lb (95.7 kg)] 211 lb (95.7 kg) (06/04 1038)  Labs:   Estimated body mass index is 35.66 kg/m as calculated from the following:   Height as of this encounter: 5' 4.5" (1.638 m).   Weight as of this encounter: 211 lb (95.7 kg).   Imaging Review Plain radiographs demonstrate moderate degenerative joint disease of the right hip(s). The bone  quality appears to be good for age and reported activity level.  Assessment/Plan:  End stage arthritis, right hip(s)  The patient history, physical examination, clinical judgement of the provider and imaging studies are consistent with end stage degenerative joint disease of the right hip(s) and total hip arthroplasty is deemed medically necessary. The treatment options including medical management, injection therapy, arthroscopy and arthroplasty were discussed at length. The risks and benefits of total hip arthroplasty were presented and reviewed. The risks due to aseptic loosening, infection, stiffness, dislocation/subluxation,  thromboembolic complications and other imponderables were discussed.  The patient acknowledged the explanation, agreed to proceed with the plan and consent was signed. Patient is being admitted for inpatient treatment for surgery, pain control, PT, OT, prophylactic antibiotics, VTE prophylaxis, progressive ambulation and ADL's and discharge planning.The patient is planning to be discharged home with home health services

## 2016-06-21 NOTE — Interval H&P Note (Signed)
History and Physical Interval Note:  06/21/2016 12:29 PM  [name]  has presented today for surgery, with the diagnosis of Osteoarthritis Right Hip  The various methods of treatment have been discussed with the patient and family. After consideration of risks, benefits and other options for treatment, the patient has consented to  Procedure(s): RIGHT TOTAL HIP ARTHROPLASTY ANTERIOR APPROACH (Right) as a surgical intervention .  The patient's history has been reviewed, patient examined, no change in status, stable for surgery.  I have reviewed the patient's chart and labs.  Questions were answered to the patient's satisfaction.     Marybelle Killings

## 2016-06-21 NOTE — Anesthesia Preprocedure Evaluation (Signed)
Anesthesia Evaluation  Patient identified by MRN, date of birth, ID band Patient awake    Reviewed: Allergy & Precautions, NPO status , Patient's Chart, lab work & pertinent test results, reviewed documented beta blocker date and time   History of Anesthesia Complications Negative for: history of anesthetic complications  Airway Mallampati: II  TM Distance: >3 FB Neck ROM: Full    Dental  (+) Dental Advisory Given, Edentulous Upper, Edentulous Lower   Pulmonary shortness of breath, asthma , sleep apnea and Oxygen sleep apnea , former smoker,    breath sounds clear to auscultation       Cardiovascular hypertension, + Past MI  Normal cardiovascular exam+ dysrhythmias  Rhythm:Regular  Nuclear stress test 12/22/13:  1. No reversible ischemia or infarction. 2. Normal left ventricular wall motion. 3. Left ventricular ejection fraction 42% 4. Low-risk stress test findings    Neuro/Psych  Headaches, PSYCHIATRIC DISORDERS Depression  Neuromuscular disease    GI/Hepatic Neg liver ROS, hiatal hernia, GERD  Medicated and Controlled,  Endo/Other  diabetes, Well Controlled, Type 2, Oral Hypoglycemic AgentsMorbid obesity  Renal/GU negative Renal ROS     Musculoskeletal  (+) Arthritis ,   Abdominal   Peds  Hematology  (+) anemia ,   Anesthesia Other Findings   Reproductive/Obstetrics                             Anesthesia Physical Anesthesia Plan  ASA: III  Anesthesia Plan: General   Post-op Pain Management:    Induction: Intravenous  Airway Management Planned: Oral ETT and LMA  Additional Equipment: None  Intra-op Plan:   Post-operative Plan: Extubation in OR  Informed Consent: I have reviewed the patients History and Physical, chart, labs and discussed the procedure including the risks, benefits and alternatives for the proposed anesthesia with the patient or authorized representative who  has indicated his/her understanding and acceptance.   Dental advisory given  Plan Discussed with: CRNA and Surgeon  Anesthesia Plan Comments:         Anesthesia Quick Evaluation

## 2016-06-21 NOTE — Progress Notes (Signed)
Pt complaining because a baby (patient) is crying, earplugs given and curtain pulled

## 2016-06-22 ENCOUNTER — Encounter (HOSPITAL_COMMUNITY): Payer: Self-pay | Admitting: Orthopaedic Surgery

## 2016-06-22 LAB — GLUCOSE, CAPILLARY
GLUCOSE-CAPILLARY: 161 mg/dL — AB (ref 65–99)
GLUCOSE-CAPILLARY: 93 mg/dL (ref 65–99)
GLUCOSE-CAPILLARY: 149 mg/dL — AB (ref 65–99)
Glucose-Capillary: 126 mg/dL — ABNORMAL HIGH (ref 65–99)

## 2016-06-22 LAB — CBC
HEMATOCRIT: 26.2 % — AB (ref 36.0–46.0)
Hemoglobin: 7.9 g/dL — ABNORMAL LOW (ref 12.0–15.0)
MCH: 24.5 pg — ABNORMAL LOW (ref 26.0–34.0)
MCHC: 30.2 g/dL (ref 30.0–36.0)
MCV: 81.4 fL (ref 78.0–100.0)
PLATELETS: 259 10*3/uL (ref 150–400)
RBC: 3.22 MIL/uL — AB (ref 3.87–5.11)
RDW: 15.9 % — AB (ref 11.5–15.5)
WBC: 11.5 10*3/uL — AB (ref 4.0–10.5)

## 2016-06-22 LAB — BASIC METABOLIC PANEL
ANION GAP: 8 (ref 5–15)
BUN: 6 mg/dL (ref 6–20)
CALCIUM: 8 mg/dL — AB (ref 8.9–10.3)
CO2: 22 mmol/L (ref 22–32)
Chloride: 108 mmol/L (ref 101–111)
Creatinine, Ser: 0.74 mg/dL (ref 0.44–1.00)
GLUCOSE: 126 mg/dL — AB (ref 65–99)
POTASSIUM: 3.6 mmol/L (ref 3.5–5.1)
Sodium: 138 mmol/L (ref 135–145)

## 2016-06-22 NOTE — Care Management Note (Signed)
Case Management Note  Patient Details  Name: [name] MRN: [id number] Date of Birth: [birth year]  Subjective/Objective:                 Admitted for Right total hip arthroplasty-direct anterior approach. CSW following for SNF placement at DC.    Action/Plan:  Anticipate DC to SNF as facilitated by CSW.   Expected Discharge Date:                  Expected Discharge Plan:  Skilled Nursing Facility  In-House Referral:  Clinical Social Work  Discharge planning Services  CM Consult  Post Acute Care Choice:    Choice offered to:     DME Arranged:    DME Agency:     HH Arranged:    [place] Agency:     Status of Service:  Completed, signed off  If discussed at [employer], dates discussed:    Additional Comments:  Carles Collet, RN 06/22/2016, 2:17 PM

## 2016-06-22 NOTE — Clinical Social Work Note (Signed)
Clinical Social Work Assessment  Patient Details  Name: [name] MRN: [id number] Date of Birth: [birth year]  Date of referral:  06/22/16               Reason for consult:  Facility Placement                Permission sought to share information with:  Facility Art therapist granted to share information::  Yes, Verbal Permission Granted  Name::        Agency::  SNF  Relationship::     Contact Information:     Housing/Transportation Living arrangements for the past 2 months:  Single Family Home Source of Information:  Patient Patient Interpreter Needed:  None Criminal Activity/Legal Involvement Pertinent to Current Situation/Hospitalization:  No - Comment as needed Significant Relationships:  Adult Children Lives with:  Self Do you feel safe going back to the place where you live?  No Need for family participation in patient care:  No (Coment)  Care giving concerns:  Patient indicated that prior to hospitalization she received support from an aid at home 4 x's a week. She used a cane and walker for mobility.  She resides alone and has an adult son. He does not reside with her and is unable to provide care at home. Based on same, patient is unsafe to return home at this time.  Social Worker assessment / plan:  CSW met with patient at bedside to discuss recommendations by clinical team to DC to SNF. CSW explained role and SNF options/placement. CSW obtained permission to send out offers. Patient indicated that she wants [facility] for placement. CSW obtained permission to send to local area as well. Patient agreed to same. Patient indicated that she has informed her son of everything and did not need CSW to f/u with him.  FL2 and passr obtained. Offers sent.   Employment status:  Retired Nurse, adult PT Recommendations:  [place] / Referral to community resources:  [place]  Patient/Family's  Response to care:  Patient appreciative of assistance from [place]. No issues with care indicated.  Patient/Family's Understanding of and Emotional Response to Diagnosis, Current Treatment, and Prognosis:  Patient has good understanding of diagnosis, current treatment and prognosis. Patient hopeful that impairment will improve with rehabilitation. No issues or concerns at this time.  Emotional Assessment Appearance:  Appears stated age Attitude/Demeanor/Rapport:   (Cooperative) Affect (typically observed):  Accepting, Apprehensive Orientation:  Oriented to Self, Oriented to Place, Oriented to  Time, Oriented to Situation Alcohol / Substance use:  Not Applicable Psych involvement (Current and /or in the community):  No (Comment)  Discharge Needs  Concerns to be addressed:  Care Coordination Readmission within the last 30 days:  No Current discharge risk:  Dependent with Mobility, Physical Impairment Barriers to Discharge:  No Barriers Identified   Normajean Baxter, LCSW 06/22/2016, 1:03 PM

## 2016-06-22 NOTE — Op Note (Signed)
Surgery date 06/21/16  Preop diagnosis: Right primary hip osteoarthritis  Postoperative diagnosis: Same  Procedure: Right total hip arthroplasty-direct anterior approach  Surgeon: Rodell Perna M.D.  Assistant: Benjiman Core PA-C medically necessary and present for the entire procedure  Anesthesia: Gen. plus Marcaine local  Implants Depuy 50 mm Gription cup, +4 poly-neutral liner,Corail #9 stem. 32 mm ceramic ball +1 neck length  Procedure after induction general anesthesia preoperative antibiotics patient was placed on the [place] table C-arm was brought in to confirm visualization of both hips. Center prepping and draping was performed timeout procedure was completed the usual sheets drapes large shower curtain Betadine Steri-Drape was applied. Sterile skin marker been applied to the skin. Incision was made direct anterior approach obliquely starting 2 cm lateral and inferior to the ASIS extending obliquely down to the trochanter. Skin protector was placed fascia was next grasped with an Allis clamp finger dissection medially and interval plain. Blunt Cobra placed over the top of the capsule hemostasis obtained. Capsule was opened there was clear joint fluid present. Copious placed above the neck and under C-arm visualization the neck was cut with an oscillating saw head was removed there was osteoarthritic changes with grade 4 changes on the head and acetabulum. Placement of anterior retractor carefully directly over bone reaming of the acetabulum progressing up to a 49 mm 50 mm cup Gripton was placed. Inserted under C-arm visualization impacted down to good stability and the central hole was screwed in tight with good stability of the hip. Poly-cup was placed without appropriately impacted with good stability. Leg was taken down and under external rotation 924 hydraulic lift was carefully placed in used. Posterior capsule was divided to allow mobilization laterally. Medial retractor placed medially  large, retractor placed above the trochanter. Progression down with the curettes Ronjair cookie-cutter, chili pepper 89 mm broach with good stability. Trials were inserted checked under C-arm. Initially the hip cannot be reduced an additional 1 mm taken off the neck. With reduction visualization stem was good. Good position and appropriate length. From the stem was inserted impacted down to the calcar hip was reduced with the ceramic +1 mm ball attached. Stability with external rotation 90. Extension 45 external rotation 90 with good stability. Leg lengths were equal by C-arm. Copious irrigation then standard layer closure with the lock subcutaneous closure subarticular closure postop dressing and transferred recovery

## 2016-06-22 NOTE — Evaluation (Addendum)
Physical Therapy Evaluation Patient Details Name: [name] MRN: [id number] DOB: 09-22-1945 Today's Date: 06/22/2016   History of Present Illness  Pt admit for a right direct anterior approach THA.  PMH:  asthma, sleep apnea, HTN, past MI, depression, psych disorders, DM, multiple hip and knee surgeries.  Clinical Impression  Pt admitted with above diagnosis. Pt currently with functional limitations due to the deficits listed below (see PT Problem List). Pt needed mod assist overall for all aspects of mobility.  Agree with SNF for rehab prior to d/c home and pt has 10 steps to get into apartment.  Will follow acutely.  Pt will benefit from skilled PT to increase their independence and safety with mobility to allow discharge to the venue listed below.      Follow Up Recommendations SNF;Supervision/Assistance - 24 hour    Equipment Recommendations  None recommended by PT    Recommendations for Other Services       Precautions / Restrictions Precautions Precautions: Fall Restrictions Weight Bearing Restrictions: Yes RLE Weight Bearing: Weight bearing as tolerated      Mobility  Bed Mobility Overal bed mobility: Needs Assistance Bed Mobility: Supine to Sit     Supine to sit: Mod assist;HOB elevated     General bed mobility comments: Pt needed assist to come to EOB with assist moving right and left LEs and for elevation of trunk.    Transfers Overall transfer level: Needs assistance Equipment used: Rolling walker (2 wheeled) Transfers: Sit to/from Stand Sit to Stand: Mod assist;Min assist;From elevated surface         General transfer comment: Pt needed cues for hand placement.  Assist to power up.  Incr time to come to standing but did eventualy get balance.    Ambulation/Gait Ambulation/Gait assistance: Min assist Ambulation Distance (Feet): 10 Feet Assistive device: Rolling walker (2 wheeled) Gait Pattern/deviations: Step-to pattern;Decreased step length -  right;Decreased stance time - right;Decreased weight shift to right;Shuffle;Antalgic;Trunk flexed;Wide base of support   Gait velocity interpretation: Below normal speed for age/gender General Gait Details: Pt needed cues for sequencing steps and RW.  Pt kept moving left LE first due to habit and needed cues to move right LE first for comfort. Pt with difficulty with foot placement bil feet as well.  Losing balance at times but needed only a slight guard assist to recover.  Pt walker too tall but she would not allow PT to lower as she said it helps her stand tall the way it is.  She has her shoulders elevated and at least 40 degree elbow bend.  She was adamant that walker height stay as is.   Had to follow pt with chair so she could sit when desired.   Stairs            Wheelchair Mobility    Modified Rankin (Stroke Patients Only)       Balance Overall balance assessment: Needs assistance;History of Falls Sitting-balance support: Bilateral upper extremity supported;Feet supported Sitting balance-Leahy Scale: Poor Sitting balance - Comments: mod assist to sit EOB initially  Postural control: Posterior lean;Right lateral lean Standing balance support: Bilateral upper extremity supported;During functional activity Standing balance-Leahy Scale: Poor Standing balance comment: relies on UE support and min assist with RW                             Pertinent Vitals/Pain Pain Assessment: 0-10 Pain Score: 3  Pain Location: right hip, back  and left knee Pain Descriptors / Indicators: Aching;Grimacing;Guarding Pain Intervention(s): Limited activity within patient's tolerance;Monitored during session;Premedicated before session;Repositioned;RN gave pain meds during session;Patient requesting pain meds-RN notified    Home Living Family/patient expects to be discharged to:: Private residence Living Arrangements: Alone Available Help at Discharge: Personal care  attendant;Available PRN/intermittently (4 days week, 2.5 - 3 hours day) Type of Home: Apartment Home Access: Stairs to enter Entrance Stairs-Rails: Left Entrance Stairs-Number of Steps: 10 Home Layout: One level (2 nd floor apartment) Home Equipment: Cane - single point;Walker - 2 wheels;Shower seat;Toilet riser;Hand held shower head (Home O2 at night) Additional Comments: used cane at home and RW outdoors    Prior Function Level of Independence: Independent with assistive device(s)               Hand Dominance   Dominant Hand: Right    Extremity/Trunk Assessment   Upper Extremity Assessment Upper Extremity Assessment: Defer to OT evaluation    Lower Extremity Assessment Lower Extremity Assessment: RLE deficits/detail;LLE deficits/detail RLE Deficits / Details: grossly 2/5 LLE Deficits / Details: grossly 2+/5    Cervical / Trunk Assessment Cervical / Trunk Assessment: Kyphotic  Communication   Communication: No difficulties  Cognition Arousal/Alertness: Lethargic;Suspect due to medications Behavior During Therapy: Flat affect Overall Cognitive Status: Within Functional Limits for tasks assessed                                        General Comments      Exercises Total Joint Exercises Ankle Circles/Pumps: AROM;Both;10 reps;Supine Quad Sets: AROM;Both;10 reps;Supine Heel Slides: AAROM;Both;10 reps;Supine Hip ABduction/ADduction: AAROM;Both;10 reps;Supine Long Arc Quad: AROM;Both;10 reps;Seated   Assessment/Plan    PT Assessment Patient needs continued PT services  PT Problem List Decreased strength;Decreased range of motion;Decreased activity tolerance;Decreased balance;Decreased mobility;Decreased knowledge of use of DME;Decreased safety awareness;Decreased knowledge of precautions;Pain       PT Treatment Interventions DME instruction;Gait training;Functional mobility training;Therapeutic activities;Therapeutic exercise;Balance  training;Patient/family education;Stair training    PT Goals (Current goals can be found in the Care Plan section)  Acute Rehab PT Goals Patient Stated Goal: to go home after rehab PT Goal Formulation: With patient Time For Goal Achievement: 07/06/16 Potential to Achieve Goals: Good    Frequency 7X/week   Barriers to discharge        Co-evaluation               AM-PAC PT "6 Clicks" Daily Activity  Outcome Measure Difficulty turning over in bed (including adjusting bedclothes, sheets and blankets)?: A Lot Difficulty moving from lying on back to sitting on the side of the bed? : A Lot Difficulty sitting down on and standing up from a chair with arms (e.g., wheelchair, bedside commode, etc,.)?: A Lot Help needed moving to and from a bed to chair (including a wheelchair)?: A Lot Help needed walking in hospital room?: A Lot Help needed climbing 3-5 steps with a railing? : Total 6 Click Score: 11    End of Session Equipment Utilized During Treatment: Gait belt Activity Tolerance: Patient limited by fatigue;Patient limited by pain Patient left: in chair;with call bell/phone within reach;with chair alarm set Nurse Communication: Mobility status;Patient requests pain meds PT Visit Diagnosis: Unsteadiness on feet (R26.81);Muscle weakness (generalized) (M62.81);Pain Pain - Right/Left: Right Pain - part of body: Hip (back)    Time: 6948-5462 PT Time Calculation (min) (ACUTE ONLY): 39 min   Charges:  PT Evaluation $PT Eval Moderate Complexity: 1 Procedure PT Treatments $Gait Training: 8-22 mins $Therapeutic Exercise: 8-22 mins   PT G Codes:        Davionne Mastrangelo,PT Acute Rehabilitation [phone] (pager)   Denice Paradise 06/22/2016, 10:31 AM

## 2016-06-22 NOTE — NC FL2 (Signed)
[place] MEDICAID FL2 LEVEL OF CARE SCREENING TOOL     IDENTIFICATION  Patient Name: [name] Birthdate: [birth year] Sex: female Admission Date (Current Location): 06/21/2016  [facility] and [place] Number:  Herbalist and Address:  [facility] [place], [place] [address], [place], [place] [address]      Provider Number: [id number]  Attending Physician Name and Address:  Marybelle Killings, MD  Relative Name and Phone Number:       Current Level of Care: Hospital Recommended Level of Care: [place] Prior Approval Number:    Date Approved/Denied: 06/22/16 PASRR Number: [id number]  Discharge Plan: SNF    Current Diagnoses: Patient Active Problem List   Diagnosis Date Noted  . Arthritis of right hip 06/21/2016  . Unilateral primary osteoarthritis, right hip 12/26/2015  . Atrial fibrillation ([place]) 01/07/2015  . Status post total left knee replacement 11/27/2014  . Insomnia due to medical condition 10/15/2014  . DDD (degenerative disc disease), lumbar 10/15/2014  . Primary osteoarthritis, left shoulder 10/15/2014  . Preventative health care 08/13/2014  . Asthma 05/17/2014  . Obstructive sleep apnea   . Other type of migraine   . DM (diabetes mellitus) type II controlled, neurological manifestation ([place])   . Dyslipidemia 11/12/2008  . Essential hypertension, benign 11/12/2008  . SYNCOPE 11/12/2008    Orientation RESPIRATION BLADDER Height & Weight     Self, Time, Situation, Place  O2 (Nasal Cannula 2L) Continent, Indwelling catheter Weight: 211 lb (95.7 kg) Height:  5' 4.5" (163.8 cm)  BEHAVIORAL SYMPTOMS/MOOD NEUROLOGICAL BOWEL NUTRITION STATUS      Continent Diet (See DC Summary)  AMBULATORY STATUS COMMUNICATION OF NEEDS Skin   Limited Assist Verbally Surgical wounds (Closed right HIp Incision with Silver Hydrofiber dressing)                       Personal Care Assistance Level of Assistance  Bathing, Feeding, Dressing  Bathing Assistance: Limited assistance Feeding assistance: Independent Dressing Assistance: Limited assistance     Functional Limitations Info  Sight, Hearing, Speech Sight Info: Adequate Hearing Info: Adequate Speech Info: Adequate    SPECIAL CARE FACTORS FREQUENCY  PT (By licensed PT), OT (By licensed OT)     PT Frequency: 5xweek OT Frequency: 5xweek            Contractures      Additional Factors Info  Code Status, Allergies, Psychotropic, Insulin Sliding Scale Code Status Info: Full Allergies Info: CONTRAST MEDIA IODINATED DIAGNOSTIC AGENTS, GADOLINIUM, IOHEXOL, ONION, OTHER, TRAZODONE AND NEFAZODONE, ASPIRIN  Psychotropic Info: Topamax Insulin Sliding Scale Info: 15units 3x's a day       Current Medications (06/22/2016):  This is the current hospital active medication list Current Facility-Administered Medications  Medication Dose Route Frequency Provider Last Rate Last Dose  . 0.9 %  sodium chloride infusion   Intravenous Continuous Lanae Crumbly, PA-C 90 mL/hr at 06/21/16 1712    . acetaminophen (TYLENOL) tablet 650 mg  650 mg Oral Q6H PRN Lanae Crumbly, PA-C       Or  . acetaminophen (TYLENOL) suppository 650 mg  650 mg Rectal Q6H PRN Lanae Crumbly, PA-C      . albuterol (PROVENTIL) (2.5 MG/3ML) 0.083% nebulizer solution 3 mL  3 mL Inhalation Q6H PRN Benjiman Core M, PA-C      . amitriptyline (ELAVIL) tablet 50 mg  50 mg Oral QHS Benjiman Core M, PA-C   50 mg at 06/21/16  2118  . aspirin EC tablet 325 mg  325 mg Oral Q breakfast Benjiman Core M, PA-C      . atorvastatin (LIPITOR) tablet 40 mg  40 mg Oral q1800 Lanae Crumbly, PA-C   40 mg at 06/21/16 1713  . cholecalciferol (VITAMIN D) tablet 2,000 Units  2,000 Units Oral QPC breakfast Lanae Crumbly, PA-C   2,000 Units at 06/22/16 3235  . clopidogrel (PLAVIX) tablet 75 mg  75 mg Oral Daily Lanae Crumbly, PA-C   75 mg at 06/22/16 5732  . cycloSPORINE (RESTASIS) 0.05 % ophthalmic emulsion 1 drop  1 drop Both Eyes  BID Lanae Crumbly, PA-C   1 drop at 06/22/16 2025  . dicyclomine (BENTYL) tablet 20 mg  20 mg Oral BID Lanae Crumbly, PA-C   20 mg at 06/22/16 4270  . diltiazem (CARDIZEM CD) 24 hr capsule 180 mg  180 mg Oral Daily Lanae Crumbly, PA-C   180 mg at 06/22/16 6237  . diphenhydrAMINE (BENADRYL) capsule 25 mg  25 mg Oral Q8H PRN Newt Minion, MD   25 mg at 06/21/16 2234  . docusate sodium (COLACE) capsule 100 mg  100 mg Oral BID Lanae Crumbly, PA-C   100 mg at 06/22/16 6283  . gabapentin (NEURONTIN) tablet 600 mg  600 mg Oral QHS Lanae Crumbly, PA-C   600 mg at 06/21/16 2118  . glimepiride (AMARYL) tablet 2 mg  2 mg Oral BID WC Lanae Crumbly, PA-C   2 mg at 06/22/16 1517  . HYDROmorphone (DILAUDID) injection 0.5-1 mg  0.5-1 mg Intravenous Q3H PRN Lanae Crumbly, PA-C   1 mg at 06/22/16 0119  . insulin aspart (novoLOG) injection 0-15 Units  0-15 Units Subcutaneous TID WC Lanae Crumbly, PA-C   3 Units at 06/22/16 1256  . ipratropium (ATROVENT) 0.06 % nasal spray 2 spray  2 spray Each Nare QID Lanae Crumbly, PA-C   2 spray at 06/22/16 6160  . isosorbide mononitrate (IMDUR) 24 hr tablet 60 mg  60 mg Oral Daily Lanae Crumbly, PA-C   60 mg at 06/22/16 7371  . lactated ringers infusion   Intravenous Continuous Oleta Mouse, MD 50 mL/hr at 06/21/16 1032    . linagliptin (TRADJENTA) tablet 5 mg  5 mg Oral QPC supper Marybelle Killings, MD   5 mg at 06/21/16 1713  . losartan (COZAAR) tablet 100 mg  100 mg Oral q morning - 10a Lanae Crumbly, PA-C   100 mg at 06/22/16 0626  . menthol-cetylpyridinium (CEPACOL) lozenge 3 mg  1 lozenge Oral PRN Lanae Crumbly, PA-C       Or  . phenol (CHLORASEPTIC) mouth spray 1 spray  1 spray Mouth/Throat PRN Lanae Crumbly, PA-C      . metFORMIN (GLUCOPHAGE) tablet 1,000 mg  1,000 mg Oral QPC supper Marybelle Killings, MD   1,000 mg at 06/21/16 1714  . methocarbamol (ROBAXIN) tablet 500 mg  500 mg Oral Q6H PRN Lanae Crumbly, PA-C   500 mg at 06/22/16 9485   Or  .  methocarbamol (ROBAXIN) 500 mg in dextrose 5 % 50 mL IVPB  500 mg Intravenous Q6H PRN Lanae Crumbly, PA-C      . metoCLOPramide (REGLAN) tablet 5-10 mg  5-10 mg Oral Q8H PRN Lanae Crumbly, PA-C       Or  . metoCLOPramide (REGLAN) injection 5-10 mg  5-10 mg Intravenous Q8H PRN Lanae Crumbly, PA-C      .  metoCLOPramide (REGLAN) tablet 10 mg  10 mg Oral TID AC & HS Lanae Crumbly, PA-C   10 mg at 06/22/16 1257  . mirabegron ER (MYRBETRIQ) tablet 25 mg  25 mg Oral QPM Lanae Crumbly, PA-C   25 mg at 06/21/16 1712  . mometasone-formoterol (DULERA) 200-5 MCG/ACT inhaler 2 puff  2 puff Inhalation BID Lanae Crumbly, PA-C   2 puff at 06/22/16 0847  . nitroGLYCERIN (NITROLINGUAL) 0.4 MG/SPRAY spray 1 spray  1 spray Sublingual Q5 min PRN Lanae Crumbly, PA-C      . olopatadine (PATANOL) 0.1 % ophthalmic solution 1 drop  1 drop Both Eyes BID Lanae Crumbly, PA-C   1 drop at 06/22/16 1700  . ondansetron (ZOFRAN) tablet 4 mg  4 mg Oral Q6H PRN Lanae Crumbly, PA-C       Or  . ondansetron [facility]) injection 4 mg  4 mg Intravenous Q6H PRN Lanae Crumbly, PA-C      . oxyCODONE (Oxy IR/ROXICODONE) immediate release tablet 5-10 mg  5-10 mg Oral Q4H PRN Lanae Crumbly, PA-C   10 mg at 06/22/16 1256  . pantoprazole (PROTONIX) EC tablet 40 mg  40 mg Oral Daily Lanae Crumbly, PA-C   40 mg at 06/22/16 0841  . polyethylene glycol (MIRALAX / GLYCOLAX) packet 17 g  17 g Oral Daily PRN Lanae Crumbly, PA-C      . polyvinyl alcohol (LIQUIFILM TEARS) 1.4 % ophthalmic solution 1 drop  1 drop Both Eyes PRN Benjiman Core M, PA-C      . topiramate (TOPAMAX) tablet 100 mg  100 mg Oral QPM Benjiman Core M, PA-C   100 mg at 06/21/16 1714  . topiramate (TOPAMAX) tablet 25 mg  25 mg Oral q morning - 10a Lanae Crumbly, PA-C   25 mg at 06/22/16 1749     Discharge Medications: Please see discharge summary for a list of discharge medications.  Relevant Imaging Results:  Relevant Lab Results:   Additional Information SS: [place], LCSW

## 2016-06-22 NOTE — Progress Notes (Signed)
Physical Therapy Treatment Patient Details Name: [name] MRN: [id number] DOB: 1945/05/07 Today's Date: 06/22/2016    History of Present Illness Pt admit for a right direct anterior approach THA.  PMH:  asthma, sleep apnea, HTN, past MI, depression, psych disorders, DM, multiple hip and knee surgeries.    PT Comments    Patient declined OOB this pm.  Was agreeable to perform therapeutic exercises with min assist.   Follow Up Recommendations  SNF;Supervision/Assistance - 24 hour     Equipment Recommendations  None recommended by PT    Recommendations for Other Services       Precautions / Restrictions Precautions Precautions: Fall Restrictions Weight Bearing Restrictions: Yes RLE Weight Bearing: Weight bearing as tolerated    Mobility  Bed Mobility               General bed mobility comments: Declined  Transfers                 General transfer comment: Patient declined OOB  Ambulation/Gait                 Stairs            Wheelchair Mobility    Modified Rankin (Stroke Patients Only)       Balance                                            Cognition Arousal/Alertness: Awake/alert Behavior During Therapy: Flat affect Overall Cognitive Status: Within Functional Limits for tasks assessed                                        Exercises Total Joint Exercises Ankle Circles/Pumps: AROM;Both;10 reps;Supine Quad Sets: AROM;Right;10 reps;Supine Short Arc Quad: AROM;Right;10 reps;Supine Heel Slides: AAROM;Right;10 reps;Supine Hip ABduction/ADduction: AAROM;Right;10 reps;Supine    General Comments        Pertinent Vitals/Pain Pain Assessment: 0-10 Pain Score: 8  Pain Location: Rt hip Pain Descriptors / Indicators: Aching;Grimacing;Guarding Pain Intervention(s): Limited activity within patient's tolerance;Monitored during session;Repositioned;Ice applied    Home Living                       Prior Function            PT Goals (current goals can now be found in the care plan section) Acute Rehab PT Goals Patient Stated Goal: to go home after rehab Progress towards PT goals: Progressing toward goals    Frequency    7X/week      PT Plan Current plan remains appropriate    Co-evaluation              AM-PAC PT "6 Clicks" Daily Activity  Outcome Measure  Difficulty turning over in bed (including adjusting bedclothes, sheets and blankets)?: A Lot Difficulty moving from lying on back to sitting on the side of the bed? : A Lot Difficulty sitting down on and standing up from a chair with arms (e.g., wheelchair, bedside commode, etc,.)?: A Lot Help needed moving to and from a bed to chair (including a wheelchair)?: A Lot Help needed walking in hospital room?: A Lot Help needed climbing 3-5 steps with a railing? : Total 6 Click Score: 11    End of Session Equipment Utilized During Treatment:  Oxygen Activity Tolerance: Patient limited by pain;Patient limited by fatigue Patient left: in bed;with call bell/phone within reach;with SCD's reapplied   PT Visit Diagnosis: Unsteadiness on feet (R26.81);Muscle weakness (generalized) (M62.81);Pain;Difficulty in walking, not elsewhere classified (R26.2) Pain - Right/Left: Right Pain - part of body: Hip     Time: 1410-1430 PT Time Calculation (min) (ACUTE ONLY): 20 min  Charges:  $Therapeutic Exercise: 8-22 mins                    G Codes:       Carita Pian. Sanjuana Kava, [facility] Acute Rehab Services Pager [place] 06/22/2016, 3:16 PM

## 2016-06-22 NOTE — Progress Notes (Signed)
   Subjective: 1 Day Post-Op Procedure(s) (LRB): RIGHT TOTAL HIP ARTHROPLASTY ANTERIOR APPROACH (Right) Patient reports pain as mild and moderate.    Objective: Vital signs in last 24 hours: Temp:  [97.9 F (36.6 C)-98.6 F (37 C)] 98 F (36.7 C) (06/05 0303) Pulse Rate:  [72-91] 91 (06/05 0303) Resp:  [11-20] 16 (06/05 0303) BP: (109-153)/(60-79) 116/66 (06/05 0303) SpO2:  [93 %-100 %] 100 % (06/05 0303) Weight:  [211 lb (95.7 kg)] 211 lb (95.7 kg) (06/04 1038)  Intake/Output from previous day: 06/04 0701 - 06/05 0700 In: 1970 [P.O.:370; I.V.:1400; IV Piggyback:200] Out: 7322 [GURKY:7062; Blood:500] Intake/Output this shift: Total I/O In: 60 [P.O.:60] Out: -    Recent Labs  06/22/16 0537  HGB 7.9*    Recent Labs  06/22/16 0537  WBC 11.5*  RBC 3.22*  HCT 26.2*  PLT 259    Recent Labs  06/22/16 0537  NA 138  K 3.6  CL 108  CO2 22  BUN 6  CREATININE 0.74  GLUCOSE 126*  CALCIUM 8.0*   No results for input(s): LABPT, INR in the last 72 hours.  Neurologically intact. Leg length equal , dressing dry. Eating breakfast.  Dg Pelvis Portable  Result Date: 06/21/2016 CLINICAL DATA:  Postop right hip arthroplasty EXAM: PORTABLE PELVIS 1-2 VIEWS COMPARISON:  04/27/2016 FINDINGS: Remote changes of left hip replacement. New changes of right hip total hip replacement. Normal AP alignment. No hardware or bony complicating feature. IMPRESSION: Interval right hip replacement.  No complicating feature. Electronically Signed   By: Rolm Baptise M.D.   On: 06/21/2016 16:14   Dg C-arm 61-120 Min  Result Date: 06/21/2016 CLINICAL DATA:  Anterior approach right hip arthroplasty. EXAM: OPERATIVE right HIP (WITH PELVIS IF PERFORMED) to VIEWS TECHNIQUE: Fluoroscopic spot image(s) were submitted for interpretation post-operatively. COMPARISON:  CT scan of the abdomen pelvis of May 12, 2016 FINDINGS: The patient has undergone right total hip joint prosthesis placement. Radiographic  positioning of the prosthetic components is good. The interface of the prosthesis with the native bone appears normal. IMPRESSION: There is no immediate postprocedure complication following right anterior approach total hip joint prosthesis placement. Electronically Signed   By: David  Martinique M.D.   On: 06/21/2016 14:48   Dg Hip Operative Unilat W Or W/o Pelvis Right  Result Date: 06/21/2016 CLINICAL DATA:  Anterior approach right hip arthroplasty. EXAM: OPERATIVE right HIP (WITH PELVIS IF PERFORMED) to VIEWS TECHNIQUE: Fluoroscopic spot image(s) were submitted for interpretation post-operatively. COMPARISON:  CT scan of the abdomen pelvis of May 12, 2016 FINDINGS: The patient has undergone right total hip joint prosthesis placement. Radiographic positioning of the prosthetic components is good. The interface of the prosthesis with the native bone appears normal. IMPRESSION: There is no immediate postprocedure complication following right anterior approach total hip joint prosthesis placement. Electronically Signed   By: David  Martinique M.D.   On: 06/21/2016 14:48    Assessment/Plan: 1 Day Post-Op Procedure(s) (LRB): RIGHT TOTAL HIP ARTHROPLASTY ANTERIOR APPROACH (Right) Up with therapy, recheck Hgb in AM   [name] 06/22/2016, 8:23 AM

## 2016-06-22 NOTE — Progress Notes (Signed)
Paged [place] on-call this morning regarding patient refusal of her VTE Aspirin d/t allergy. She states her cardiologist told her to never take aspirin. Aspirin held this AM, on-call MD paged and notified.

## 2016-06-23 ENCOUNTER — Encounter (HOSPITAL_COMMUNITY): Payer: Self-pay | Admitting: Orthopaedic Surgery

## 2016-06-23 LAB — PREPARE RBC (CROSSMATCH)

## 2016-06-23 LAB — CBC
HEMATOCRIT: 25.9 % — AB (ref 36.0–46.0)
HEMOGLOBIN: 7.8 g/dL — AB (ref 12.0–15.0)
MCH: 24.6 pg — AB (ref 26.0–34.0)
MCHC: 30.1 g/dL (ref 30.0–36.0)
MCV: 81.7 fL (ref 78.0–100.0)
Platelets: 234 10*3/uL (ref 150–400)
RBC: 3.17 MIL/uL — AB (ref 3.87–5.11)
RDW: 16.4 % — ABNORMAL HIGH (ref 11.5–15.5)
WBC: 12.1 10*3/uL — ABNORMAL HIGH (ref 4.0–10.5)

## 2016-06-23 LAB — HEMOGLOBIN AND HEMATOCRIT, BLOOD
HCT: 29.1 % — ABNORMAL LOW (ref 36.0–46.0)
Hemoglobin: 8.9 g/dL — ABNORMAL LOW (ref 12.0–15.0)

## 2016-06-23 LAB — GLUCOSE, CAPILLARY
GLUCOSE-CAPILLARY: 109 mg/dL — AB (ref 65–99)
GLUCOSE-CAPILLARY: 164 mg/dL — AB (ref 65–99)
Glucose-Capillary: 85 mg/dL (ref 65–99)
Glucose-Capillary: 127 mg/dL — ABNORMAL HIGH (ref 65–99)

## 2016-06-23 MED ORDER — FUROSEMIDE 10 MG/ML IJ SOLN
20.0000 mg | Freq: Once | INTRAMUSCULAR | Status: AC
  Administered 2016-06-23: 20 mg via INTRAVENOUS
  Filled 2016-06-23: qty 2

## 2016-06-23 MED ORDER — SODIUM CHLORIDE 0.9 % IV SOLN
Freq: Once | INTRAVENOUS | Status: AC
  Administered 2016-06-23: 16:00:00 via INTRAVENOUS

## 2016-06-23 NOTE — Progress Notes (Signed)
Subjective: Hip pain controlled. Awaiting snf placement.  Feels fatigued.  Hx chronic anemia and syncope.     Objective: Vital signs in last 24 hours: Temp:  [98.6 F (37 C)-100.6 F (38.1 C)] 98.6 F (37 C) (06/06 0855) Pulse Rate:  [84-107] 98 (06/06 0855) Resp:  [16-18] 18 (06/06 0704) BP: (100-116)/(51-61) 116/61 (06/06 0855) SpO2:  [96 %-99 %] 99 % (06/06 0855)  Intake/Output from previous day: 06/05 0701 - 06/06 0700 In: 180 [P.O.:180] Out: 300 [Urine:300] Intake/Output this shift: No intake/output data recorded.   Recent Labs  06/22/16 0537 06/23/16 0538  HGB 7.9* 7.8*    Recent Labs  06/22/16 0537 06/23/16 0538  WBC 11.5* 12.1*  RBC 3.22* 3.17*  HCT 26.2* 25.9*  PLT 259 234    Recent Labs  06/22/16 0537  NA 138  K 3.6  CL 108  CO2 22  BUN 6  CREATININE 0.74  GLUCOSE 126*  CALCIUM 8.0*   No results for input(s): LABPT, INR in the last 72 hours.  Exam:  Pleasant female alert and oriented.  NAD.  Hip wound looks good.  Staples intact.  No drainage or signs of infection.  bilat calves nontender.  NVI.    Assessment/Plan: hgb today 7.8. With hx of chronic anemia and syncope followed by cardiology I will give one unit PRBC's.  Anticipate transfer to snf tomorrow.     Benjiman Core 06/23/2016, 9:15 AM

## 2016-06-23 NOTE — Progress Notes (Signed)
Physical Therapy Treatment Patient Details Name: [name] MRN: [id number] DOB: November 24, 1945 Today's Date: 06/23/2016    History of Present Illness Pt admit for a right direct anterior approach THA.  PMH:  asthma, sleep apnea, HTN, past MI, depression, psych disorders, DM, multiple hip and knee surgeries.    PT Comments    Pt admitted with above diagnosis. Pt currently with functional limitations due to the deficits listed below (see PT Problem List). Pt weaker today overall possibly due to low Hgb.  Pt only able to transfer to chair and do few exercises.  Poor progress.  Will continue as pt able.  Sats 97% on 2L throughout.  HR 98-115bpm.  Pt will benefit from skilled PT to increase their independence and safety with mobility to allow discharge to the venue listed below.     Follow Up Recommendations  SNF;Supervision/Assistance - 24 hour     Equipment Recommendations  None recommended by PT    Recommendations for Other Services       Precautions / Restrictions Precautions Precautions: Fall Restrictions Weight Bearing Restrictions: Yes RLE Weight Bearing: Weight bearing as tolerated    Mobility  Bed Mobility Overal bed mobility: Needs Assistance Bed Mobility: Supine to Sit     Supine to sit: Mod assist;HOB elevated;Min assist     General bed mobility comments: Pt able to come to EOB with use of pad and assist to elevate trunk.   Transfers Overall transfer level: Needs assistance Equipment used: Rolling walker (2 wheeled) Transfers: Sit to/from Stand Sit to Stand: Mod assist;From elevated surface;Max assist;+2 physical assistance         General transfer comment: Took incr time for pt to stand up with incr flexion at trunk.  She did stand up but was having difficulty maintaining.  Pt took pivotal steps to 3N1 with mod to max assist wtih RW with incr assist and cues to weight shift and sequence steps and RW. Pt did use the 3N1 urination.  Pt did wipe herself but was  holding her up with mod assist with pt with incr flexion and needed cues for stability .  Ambulation/Gait Ambulation/Gait assistance: Mod assist;+2 physical assistance Ambulation Distance (Feet): 3 Feet Assistive device: Rolling walker (2 wheeled) Gait Pattern/deviations: Step-to pattern;Decreased step length - right;Decreased stance time - right;Decreased weight shift to right;Shuffle;Antalgic;Trunk flexed;Wide base of support   Gait velocity interpretation: Below normal speed for age/gender General Gait Details: Pt needed cues for sequencing steps and RW.  Pt with difficulty with foot placement bil feet as well.  Pt needed incr cues and assist today as she was having trouble staying awake and standing up tall.  She kept wanting to lean her elbows on the RW.  With weight shifting help and max cues pt took some steps but could only go 3 feet as she just couldn't maintain upright posture and ultimately c/o "being hot". Hgb low therefore decided to let pt sit down.      Stairs            Wheelchair Mobility    Modified Rankin (Stroke Patients Only)       Balance Overall balance assessment: Needs assistance;History of Falls Sitting-balance support: Bilateral upper extremity supported;Feet supported Sitting balance-Leahy Scale: Poor Sitting balance - Comments: mod assist to sit EOB initially  Postural control: Posterior lean;Left lateral lean Standing balance support: Bilateral upper extremity supported;During functional activity Standing balance-Leahy Scale: Poor Standing balance comment: relies on UE support and mod assist with RW  Cognition Arousal/Alertness: Awake/alert Behavior During Therapy: Flat affect Overall Cognitive Status: Within Functional Limits for tasks assessed                                        Exercises Total Joint Exercises Ankle Circles/Pumps: AROM;Both;10 reps;Supine Quad Sets: AROM;Right;10  reps;Supine Heel Slides: AAROM;Right;10 reps;Supine Hip ABduction/ADduction: AAROM;Right;10 reps;Supine Long Arc Quad: AROM;Both;10 reps;Seated    General Comments General comments (skin integrity, edema, etc.): Pt allowed PT to move RW down two notches however due to pt unable to stand upright today, will continue to assess if this helps.       Pertinent Vitals/Pain Pain Assessment: 0-10 Pain Score: 9  Pain Location: Rt hip Pain Descriptors / Indicators: Aching;Grimacing;Guarding Pain Intervention(s): Monitored during session;Limited activity within patient's tolerance;Repositioned;Premedicated before session    Home Living                      Prior Function            PT Goals (current goals can now be found in the care plan section) Progress towards PT goals: Progressing toward goals    Frequency    7X/week      PT Plan Current plan remains appropriate    Co-evaluation              AM-PAC PT "6 Clicks" Daily Activity  Outcome Measure  Difficulty turning over in bed (including adjusting bedclothes, sheets and blankets)?: A Lot Difficulty moving from lying on back to sitting on the side of the bed? : A Lot Difficulty sitting down on and standing up from a chair with arms (e.g., wheelchair, bedside commode, etc,.)?: A Lot Help needed moving to and from a bed to chair (including a wheelchair)?: A Lot Help needed walking in hospital room?: A Lot Help needed climbing 3-5 steps with a railing? : Total 6 Click Score: 11    End of Session Equipment Utilized During Treatment: Oxygen;Gait belt Activity Tolerance: Patient limited by pain;Patient limited by fatigue Patient left: with call bell/phone within reach;in chair;with chair alarm set Nurse Communication: Mobility status PT Visit Diagnosis: Unsteadiness on feet (R26.81);Muscle weakness (generalized) (M62.81);Pain;Difficulty in walking, not elsewhere classified (R26.2) Pain - Right/Left: Right Pain -  part of body: Hip     Time: 1517-6160 PT Time Calculation (min) (ACUTE ONLY): 33 min  Charges:  $Gait Training: 8-22 mins $Therapeutic Exercise: 8-22 mins                    G Codes:       [name],PT Acute Rehabilitation [phone] (pager)    [name] 06/23/2016, 12:38 PM

## 2016-06-23 NOTE — Anesthesia Postprocedure Evaluation (Signed)
Anesthesia Post Note  Patient: [name]  Procedure(s) Performed: Procedure(s) (LRB): RIGHT TOTAL HIP ARTHROPLASTY ANTERIOR APPROACH (Right)     Patient location during evaluation: PACU Anesthesia Type: General Level of consciousness: awake and alert Pain management: pain level controlled Vital Signs Assessment: post-procedure vital signs reviewed and stable Respiratory status: spontaneous breathing, nonlabored ventilation, respiratory function stable and patient connected to nasal cannula oxygen Cardiovascular status: blood pressure returned to baseline and stable Postop Assessment: no signs of nausea or vomiting Anesthetic complications: no    Last Vitals:  Vitals:   06/23/16 0704 06/23/16 0855  BP: (!) 114/51 116/61  Pulse: (!) 107 98  Resp: 18   Temp: (!) 38.1 C 37 C    Last Pain:  Vitals:   06/23/16 0855  TempSrc: Oral  PainSc:                  Kenshin Splawn

## 2016-06-24 ENCOUNTER — Telehealth (INDEPENDENT_AMBULATORY_CARE_PROVIDER_SITE_OTHER): Payer: Self-pay | Admitting: Orthopaedic Surgery

## 2016-06-24 DIAGNOSIS — K5790 Diverticulosis of intestine, part unspecified, without perforation or abscess without bleeding: Secondary | ICD-10-CM | POA: Diagnosis not present

## 2016-06-24 DIAGNOSIS — J45909 Unspecified asthma, uncomplicated: Secondary | ICD-10-CM | POA: Diagnosis not present

## 2016-06-24 DIAGNOSIS — M1990 Unspecified osteoarthritis, unspecified site: Secondary | ICD-10-CM | POA: Diagnosis not present

## 2016-06-24 DIAGNOSIS — Z471 Aftercare following joint replacement surgery: Secondary | ICD-10-CM | POA: Diagnosis not present

## 2016-06-24 DIAGNOSIS — Z96643 Presence of artificial hip joint, bilateral: Secondary | ICD-10-CM | POA: Diagnosis not present

## 2016-06-24 DIAGNOSIS — Z853 Personal history of malignant neoplasm of breast: Secondary | ICD-10-CM | POA: Diagnosis not present

## 2016-06-24 DIAGNOSIS — I252 Old myocardial infarction: Secondary | ICD-10-CM | POA: Diagnosis not present

## 2016-06-24 DIAGNOSIS — K449 Diaphragmatic hernia without obstruction or gangrene: Secondary | ICD-10-CM | POA: Diagnosis not present

## 2016-06-24 DIAGNOSIS — K219 Gastro-esophageal reflux disease without esophagitis: Secondary | ICD-10-CM | POA: Diagnosis not present

## 2016-06-24 DIAGNOSIS — E7800 Pure hypercholesterolemia, unspecified: Secondary | ICD-10-CM | POA: Diagnosis not present

## 2016-06-24 DIAGNOSIS — K209 Esophagitis, unspecified: Secondary | ICD-10-CM | POA: Diagnosis not present

## 2016-06-24 DIAGNOSIS — I10 Essential (primary) hypertension: Secondary | ICD-10-CM | POA: Diagnosis not present

## 2016-06-24 DIAGNOSIS — G43909 Migraine, unspecified, not intractable, without status migrainosus: Secondary | ICD-10-CM | POA: Diagnosis not present

## 2016-06-24 DIAGNOSIS — K648 Other hemorrhoids: Secondary | ICD-10-CM | POA: Diagnosis not present

## 2016-06-24 DIAGNOSIS — M4310 Spondylolisthesis, site unspecified: Secondary | ICD-10-CM | POA: Diagnosis not present

## 2016-06-24 DIAGNOSIS — Z96641 Presence of right artificial hip joint: Secondary | ICD-10-CM | POA: Diagnosis not present

## 2016-06-24 DIAGNOSIS — J449 Chronic obstructive pulmonary disease, unspecified: Secondary | ICD-10-CM | POA: Diagnosis not present

## 2016-06-24 DIAGNOSIS — E119 Type 2 diabetes mellitus without complications: Secondary | ICD-10-CM | POA: Diagnosis not present

## 2016-06-24 DIAGNOSIS — I129 Hypertensive chronic kidney disease with stage 1 through stage 4 chronic kidney disease, or unspecified chronic kidney disease: Secondary | ICD-10-CM | POA: Diagnosis not present

## 2016-06-24 DIAGNOSIS — M1611 Unilateral primary osteoarthritis, right hip: Secondary | ICD-10-CM | POA: Diagnosis not present

## 2016-06-24 DIAGNOSIS — M25559 Pain in unspecified hip: Secondary | ICD-10-CM | POA: Diagnosis not present

## 2016-06-24 LAB — BPAM RBC
Blood Product Expiration Date: 201806122359
ISSUE DATE / TIME: 201806061237
Unit Type and Rh: 600

## 2016-06-24 LAB — CBC
HCT: 28.1 % — ABNORMAL LOW (ref 36.0–46.0)
HEMOGLOBIN: 8.7 g/dL — AB (ref 12.0–15.0)
MCH: 25 pg — ABNORMAL LOW (ref 26.0–34.0)
MCHC: 31 g/dL (ref 30.0–36.0)
MCV: 80.7 fL (ref 78.0–100.0)
Platelets: 232 10*3/uL (ref 150–400)
RBC: 3.48 MIL/uL — ABNORMAL LOW (ref 3.87–5.11)
RDW: 15.7 % — AB (ref 11.5–15.5)
WBC: 16.1 10*3/uL — ABNORMAL HIGH (ref 4.0–10.5)

## 2016-06-24 LAB — TYPE AND SCREEN
ABO/RH(D): A POS
Antibody Screen: NEGATIVE
Unit division: 0

## 2016-06-24 LAB — GLUCOSE, CAPILLARY: GLUCOSE-CAPILLARY: 143 mg/dL — AB (ref 65–99)

## 2016-06-24 MED ORDER — OXYCODONE-ACETAMINOPHEN 5-325 MG PO TABS: 1.0000 | ORAL_TABLET | Freq: Four times a day (QID) | ORAL | 0 refills | Status: AC | PRN

## 2016-06-24 MED ORDER — METHOCARBAMOL 500 MG PO TABS: 500.0000 mg | ORAL_TABLET | Freq: Four times a day (QID) | ORAL | 0 refills | Status: AC

## 2016-06-24 NOTE — Progress Notes (Signed)
Patient ID: [name], female   DOB: [birth year], 71 y.o.   MRN: [id number]   We had prescribed aspirin postop for DVT prophylaxis but patient states that aspirin causes bad tachycardia and GI issues.  Spoke with Dr Lorin Mercy will call [facility] and have them start lovenox 40mg  sq daily x 3-4 weeks postop.

## 2016-06-24 NOTE — Clinical Social Work Placement (Addendum)
   CLINICAL SOCIAL WORK PLACEMENT  NOTE  Date:  07/12/2016  Patient Details  Name: [name] MRN: [id number] Date of Birth: 09/16/1945  Clinical Social Work is seeking post-discharge placement for this patient at the [place] level of care (*CSW will initial, date and re-position this form in  chart as items are completed):  Yes   Patient/family provided with [place] Work Department's list of facilities offering this level of care within the geographic area requested by the patient (or if unable, by the patient's family).  Yes   Patient/family informed of their freedom to choose among providers that offer the needed level of care, that participate in Medicare, Medicaid or managed care program needed by the patient, have an available bed and are willing to accept the patient.  Yes   Patient/family informed of [facility]'s ownership interest in [facility] and [facility], as well as of the fact that they are under no obligation to receive care at these facilities.  PASRR submitted to EDS on       PASRR number received on 06/22/16     Existing PASRR number confirmed on 06/22/16     FL2 transmitted to all facilities in geographic area requested by pt/family on 06/22/16     FL2 transmitted to all facilities within larger geographic area on 06/22/16     Patient informed that his/her managed care company has contracts with or will negotiate with certain facilities, including the following:        Yes   Patient/family informed of bed offers received.  Patient chooses bed at [facility] recommends and patient chooses bed at      Patient to be transferred to [facility] on .  Patient to be transferred to facility by [facility]     Patient family notified on 06/29/2016 of transfer.  Name of family member notified:  patient is responsible for self  & indicated that she would call her family on her  own.  PHYSICIAN Please prepare priority discharge summary, including medications, Please prepare prescriptions, Please sign FL2     Additional Comment:    _______________________________________________ Normajean Baxter, LCSW 07/03/2016, 8:45 AM

## 2016-06-24 NOTE — Discharge Summary (Signed)
Patient ID: [name] MRN: [id number] DOB/AGE: 1945/09/03 71 y.o.  Admit date: 06/21/2016 Discharge date: 07/14/2016  Admission Diagnoses:  Active Problems:   Arthritis of right hip   Discharge Diagnoses:  Active Problems:   Arthritis of right hip  status post Procedure(s): RIGHT TOTAL HIP ARTHROPLASTY ANTERIOR APPROACH  Past Medical History:  Diagnosis Date  . Anemia   . Arthritis   . Asthma   . Benign neoplasm of colon 11/09/09   Colonoscopy 11/09/09 - FINDINGS: 1) 73m polp in transverse colon, resected & retrieved.  2) Diverticulosis in sigmoid & descending colon.  3) Medium-sized, internal hemorrhoids.  . Breast cancer [place]   Left breast cancer  . Chronic kidney disease    states she has a cyst on her kidney  . CMC arthritis    Left middle finger  . Depression    40+ years  . Diabetes mellitus 10/2008   type 2  . Diverticulosis of colon (without mention of hemorrhage)    Colonoscopy 11/09/09 - FINDINGS: 1) 129mpolp in transverse colon, resected & retrieved.  2) Diverticulosis in sigmoid & descending colon.  3) Medium-sized, internal hemorrhoids.  . Marland Kitchenyspnea    when over exerting or when she gets too hot  . Dysrhythmia   . Esophagitis   . Essential hypertension, benign   . Facet arthropathy, lumbar (HCC)    L3-4  . GERD (gastroesophageal reflux disease)   . Headache    migraines  . Hemorrhage of rectum and anus    Colonoscopy 11/09/09 - FINDINGS: 1) 1062molp in transverse colon, resected & retrieved.  2) Diverticulosis in sigmoid & descending colon.  3) Medium-sized, internal hemorrhoids.  . Hiatal hernia   . Hypercholesteremia   . Internal hemorrhoids without mention of complication    Colonoscopy 11/09/09 - FINDINGS: 1) 13m35mlp in transverse colon, resected & retrieved.  2) Diverticulosis in sigmoid & descending colon.  3) Medium-sized, internal hemorrhoids.  . [facility] [id number]   NM Myocar Multi W/Spect W/Wall  Motion EF on 08/07/10 - IMPRESSION: 1) No evidence of ischemia.  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  3) Normal LV wall motion.  4) Estimated Q G S EF 70%, unchanged.  . Obese   . Obstructive sleep apnea    uses oxygen at night 2 liters at night  . Paroxysmal atrial fibrillation (HCC)    a identified on LINQ  . Spondylolisthesis   . Syncope    MDT LinQ implanted for evaluation    Surgeries: Procedure(s): RIGHT TOTAL HIP ARTHROPLASTY ANTERIOR APPROACH on 06/21/2016   Consultants:   Discharged Condition: Improved  Hospital Course: Sydney UDOVICHan 71 y55. female who was admitted 06/21/2016 for operative treatment of right hip djd. Patient failed conservative treatments (please see the history and physical for the specifics) and had severe unremitting pain that affects sleep, daily activities and work/hobbies. After pre-op clearance, the patient was taken to the operating room on 06/21/2016 and underwent  Procedure(s): RIGHT TOTAL HIP ARTHROPLASTY ANTERIOR APPROACH.    Patient was given perioperative antibiotics: Anti-infectives    Start     Dose/Rate Route Frequency Ordered Stop   06/21/16 1900  vancomycin (VANCOCIN) IVPB 1000 mg/200 mL premix     1,000 mg 200 mL/hr over 60 Minutes Intravenous Every 12 hours 06/21/16 1625 06/21/16 2221   06/21/16 1200  ceFAZolin (ANCEF) IVPB 2g/100 mL premix  2 g 200 mL/hr over 30 Minutes Intravenous To [facility] 06/18/16 0751 06/21/16 1305       Patient was given sequential compression devices and early ambulation to prevent DVT.   Patient benefited maximally from hospital stay and there were no complications. At the time of discharge, the patient was urinating/moving their bowels without difficulty, tolerating a regular diet, pain is controlled with oral pain medications and they have been cleared by PT/OT.   Recent vital signs: Patient Vitals for the past 24 hrs:  BP Temp Temp src Pulse  Resp SpO2  06/19/2016 0412 124/77 99.9 F (37.7 C) Oral 97 20 98 %  06/23/16 2136 113/71 98.8 F (37.1 C) Oral 99 20 98 %  06/23/16 1810 - 99 F (37.2 C) Oral - - -  06/23/16 1608 (!) 103/57 99.7 F (37.6 C) Oral 99 18 98 %  06/23/16 1341 129/62 99 F (37.2 C) Oral 96 18 99 %  06/23/16 1247 (!) 100/49 98.9 F (37.2 C) Oral (!) 103 18 97 %  06/23/16 1230 (!) 100/49 98.9 F (37.2 C) Oral (!) 103 - 97 %  06/23/16 1142 (!) 98/43 99.8 F (37.7 C) Oral 99 20 99 %  06/23/16 0921 - - - - - 99 %  06/23/16 0855 116/61 98.6 F (37 C) Oral 98 - 99 %     Recent laboratory studies:  Recent Labs  06/22/16 0537 06/23/16 0538 06/23/16 1952 06/29/2016 0425  WBC 11.5* 12.1*  --  16.1*  HGB 7.9* 7.8* 8.9* 8.7*  HCT 26.2* 25.9* 29.1* 28.1*  PLT 259 234  --  232  NA 138  --   --   --   K 3.6  --   --   --   CL 108  --   --   --   CO2 22  --   --   --   BUN 6  --   --   --   CREATININE 0.74  --   --   --   GLUCOSE 126*  --   --   --   CALCIUM 8.0*  --   --   --      Discharge Medications:   Allergies as of 06/20/2016      Reactions   Contrast Media [iodinated Diagnostic Agents] Anaphylaxis   Gadolinium Anaphylaxis      Iohexol Anaphylaxis   Onion Other (See Comments)   Migraines    Other Other (See Comments)   Aged cheddar - migraines  Smoked meat - migraines    Trazodone And Nefazodone Other (See Comments)   Dizziness,lightheadness, heart felt funny   Aspirin Nausea And Vomiting   TACHYCARDIA      Medication List    STOP taking these medications   acetaminophen 500 MG tablet Commonly known as:  TYLENOL   azithromycin 250 MG tablet Commonly known as:  ZITHROMAX Z-PAK   diazepam 5 MG tablet Commonly known as:  VALIUM   GARLIC PO   HYDROcodone-acetaminophen 10-325 MG tablet Commonly known as:  NORCO   multivitamin with minerals Tabs tablet   oxyCODONE 5 MG immediate release tablet Commonly known as:  ROXICODONE     TAKE these medications   ACCU-CHEK AVIVA PLUS  w/Device Kit [place] as directed to check blood sugars daily   ACCU-CHEK SOFTCLIX LANCETS lancets 1 each by Other route 2 (two) times daily. Use to check blood sugars twice a day   ADVAIR HFA 115-21 MCG/ACT inhaler Generic drug:  fluticasone-salmeterol INHALE 2 PUFFS INTO THE LUNGS TWO TIMES DAILY.   Alcohol Pads 70 % Pads Use to help clean site to check blood sugars daily Dx E11.9   amitriptyline 50 MG tablet Commonly known as:  ELAVIL Take 1 tablet (50 mg total) by mouth at bedtime.   ARTIFICIAL TEARS OP Apply 1 drop to eye 2 (two) times daily.   atorvastatin 40 MG tablet Commonly known as:  LIPITOR Take 1 tablet (40 mg total) by mouth daily at 6 PM. What changed:  when to take this   BIOTIN PO Take 1 tablet by mouth daily with breakfast.   clopidogrel 75 MG tablet Commonly known as:  PLAVIX TAKE ONE TABLET BY MOUTH DAILY.   cycloSPORINE 0.05 % ophthalmic emulsion Commonly known as:  RESTASIS Place 1 drop into both eyes 2 (two) times daily.   DEXILANT 60 MG capsule Generic drug:  dexlansoprazole TAKE 1 CAPSULE BY MOUTH ONCE DAILY What changed:  See the new instructions.   dicyclomine 20 MG tablet Commonly known as:  BENTYL Take 1 tablet (20 mg total) by mouth 2 (two) times daily. For abdominal pain   diltiazem 180 MG 24 hr capsule Commonly known as:  CARDIZEM CD Take 1 capsule (180 mg total) by mouth daily. What changed:  when to take this   diphenhydrAMINE 25 MG tablet Commonly known as:  BENADRYL Take 25 mg by mouth at bedtime. May take 8ms during the day as needed for itching   FIBER SELECT GUMMIES PO Take 2 capsules by mouth daily.   gabapentin 300 MG capsule Commonly known as:  NEURONTIN TAKE 2 CAPSULES BY MOUTH EVERY NIGHT AT BEDTIME BETWEEN 1 AND 2 HOURS PRIOR TO BEDTIME. YOU CAN TAKE ANOTHER 300MG IN DAYTIME FOR PAIN What changed:  additional instructions   glimepiride 2 MG tablet Commonly known as:  AMARYL Take 2 mg by mouth 2 (two) times  daily.   glucose blood test strip Commonly known as:  ACCU-CHEK AVIVA PLUS 1 each by Other route 2 (two) times daily. Use to check blood sugars twice a day   ipratropium 0.06 % nasal spray Commonly known as:  ATROVENT INSTILL 2 SPRAYS IN EACH NOSTRIL FOUR TIMES A DAY   isosorbide mononitrate 60 MG 24 hr tablet Commonly known as:  IMDUR TAKE 1 TABLET BY MOUTH ONCE DAILY What changed:  See the new instructions.   losartan 100 MG tablet Commonly known as:  COZAAR TAKE 1 TABLET BY MOUTH EVERY MORNING   methocarbamol 500 MG tablet Commonly known as:  ROBAXIN Take 1 tablet (500 mg total) by mouth 4 (four) times daily.   metoCLOPramide 10 MG tablet Commonly known as:  REGLAN Take 1 tablet (10 mg total) by mouth 4 (four) times daily -  before meals and at bedtime.   MYRBETRIQ 25 MG Tb24 tablet Generic drug:  mirabegron ER Take 25 mg by mouth every evening.   nitroGLYCERIN 0.4 MG/SPRAY spray Commonly known as:  NITROLINGUAL Place 1 spray under the tongue every 5 (five) minutes as needed. Chest pains   olopatadine 0.1 % ophthalmic solution Commonly known as:  PATANOL Insert 1 drop in both eyes twice daily   oxyCODONE-acetaminophen 5-325 MG tablet Commonly known as:  PERCOCET/ROXICET Take 1-2 tablets by mouth every 6 (six) hours as needed for severe pain.   PROAIR HFA 108 (90 Base) MCG/ACT inhaler Generic drug:  albuterol INHALE 2 PUFFS BY MOUTH EVERY 6 HOURS FOR WHEEZING AND SHORTNESS OF BREATH   Saxagliptin-Metformin 05-998 MG  Tb24 Commonly known as:  KOMBIGLYZE XR TAKE 1 TABLET BY MOUTH EVERY EVENING WITH A MEAL   topiramate 25 MG tablet Commonly known as:  TOPAMAX Take 1 tablet (25 mg total) by mouth every morning.   topiramate 100 MG tablet Commonly known as:  TOPAMAX Take 1 tablet (100 mg total) by mouth every evening.   Vitamin D3 2000 units capsule Take 2,000 Units by mouth daily after breakfast.       Diagnostic Studies: Dg Pelvis Portable  Result  Date: 06/21/2016 CLINICAL DATA:  Postop right hip arthroplasty EXAM: PORTABLE PELVIS 1-2 VIEWS COMPARISON:  04/27/2016 FINDINGS: Remote changes of left hip replacement. New changes of right hip total hip replacement. Normal AP alignment. No hardware or bony complicating feature. IMPRESSION: Interval right hip replacement.  No complicating feature. Electronically Signed   By: Rolm Baptise M.D.   On: 06/21/2016 16:14   Dg C-arm 61-120 Min  Result Date: 06/21/2016 CLINICAL DATA:  Anterior approach right hip arthroplasty. EXAM: OPERATIVE right HIP (WITH PELVIS IF PERFORMED) to VIEWS TECHNIQUE: Fluoroscopic spot image(s) were submitted for interpretation post-operatively. COMPARISON:  CT scan of the abdomen pelvis of May 12, 2016 FINDINGS: The patient has undergone right total hip joint prosthesis placement. Radiographic positioning of the prosthetic components is good. The interface of the prosthesis with the native bone appears normal. IMPRESSION: There is no immediate postprocedure complication following right anterior approach total hip joint prosthesis placement. Electronically Signed   By: David  Martinique M.D.   On: 06/21/2016 14:48   Dg Hip Operative Unilat W Or W/o Pelvis Right  Result Date: 06/21/2016 CLINICAL DATA:  Anterior approach right hip arthroplasty. EXAM: OPERATIVE right HIP (WITH PELVIS IF PERFORMED) to VIEWS TECHNIQUE: Fluoroscopic spot image(s) were submitted for interpretation post-operatively. COMPARISON:  CT scan of the abdomen pelvis of May 12, 2016 FINDINGS: The patient has undergone right total hip joint prosthesis placement. Radiographic positioning of the prosthetic components is good. The interface of the prosthesis with the native bone appears normal. IMPRESSION: There is no immediate postprocedure complication following right anterior approach total hip joint prosthesis placement. Electronically Signed   By: David  Martinique M.D.   On: 06/21/2016 14:48      Contact information for  after-discharge care    Destination    [facility] .   Specialty:  [place] information: [address] [place] [phone]              Discharge Plan:  discharge to SNF  Disposition:     Signed: Benjiman Core  For Rodell Perna MD 07/12/2016, 8:05 AM

## 2016-06-24 NOTE — Progress Notes (Signed)
PT Cancellation Note  Patient Details Name: [name] MRN: [id number] DOB: Aug 15, 1945   Cancelled Treatment:    Reason Eval/Treat Not Completed:  (Pt going to SNF with stretcher in room  as PT arrived.  )   Denice Paradise 07/09/2016, 11:14 AM [facility] [phone] (pager)

## 2016-06-24 NOTE — Progress Notes (Signed)
Stopped by to visit with patient. When learned she'd be discharged today, offered prayers of thanksgiving and for continued healing. Pt is [name] and a [employer] a facility group to which she is eager to return. Someone is substituting for her during her recovery, but she misses her work for the [employer] with this group. Pt graciously will be lifting me up in prayer too. Chaplain available for f/u.   07/11/2016 1000  Clinical Encounter Type  Visited With Patient and family together  Visit Type Initial;Psychological support;Spiritual support;Social support  Referral From Chaplain  Spiritual Encounters  Spiritual Needs Prayer;Emotional  Stress Factors  Patient Stress Factors Health changes;Loss of control   Gerrit Heck, Chaplain

## 2016-06-24 NOTE — Social Work (Signed)
Clinical Social Worker facilitated patient discharge including contacting patient family and facility to confirm patient discharge plans.  Clinical information faxed to facility and family agreeable with plan.  CSW arranged ambulance transport via [facility] to [name].  RN to call [phone] report prior to discharge.  Clinical Social Worker will sign off for now as social work intervention is no longer needed. Please consult [place] again if new need arises.  Elissa Hefty, LCSW Clinical Social Worker [phone]

## 2016-06-24 NOTE — Discharge Instructions (Signed)
INSTRUCTIONS AFTER JOINT REPLACEMENT   o Remove items at home which could result in a fall. This includes throw rugs or furniture in walking pathways o ICE to the affected joint every three hours while awake for 30 minutes at a time, for at least the first 3-5 days, and then as needed for pain and swelling.  Continue to use ice for pain and swelling. You may notice swelling that will progress down to the foot and ankle.  This is normal after surgery.  Elevate your leg when you are not up walking on it.   o Continue to use the breathing machine you got in the hospital (incentive spirometer) which will help keep your temperature down.  It is common for your temperature to cycle up and down following surgery, especially at night when you are not up moving around and exerting yourself.  The breathing machine keeps your lungs expanded and your temperature down.   DIET:  As you were doing prior to hospitalization, we recommend a well-balanced diet.  DRESSING / WOUND CARE / SHOWERING  You may change your dressing 3-5 days after surgery.  Then change the dressing every day with sterile gauze.  Please use good hand washing techniques before changing the dressing.  Do not use any lotions or creams on the incision until instructed by your surgeon.  ACTIVITY  o Increase activity slowly as tolerated, but follow the weight bearing instructions below.   o No driving for 6 weeks or until further direction given by your physician.  You cannot drive while taking narcotics.  o No lifting or carrying greater than 10 lbs. until further directed by your surgeon. o Avoid periods of inactivity such as sitting longer than an hour when not asleep. This helps prevent blood clots.  o You may return to work once you are authorized by your doctor.     WEIGHT BEARING   Weight bearing as tolerated with assist device (walker, cane, etc) as directed, use it as long as suggested by your surgeon or therapist, typically at  least 4-6 weeks.  HIP PRECAUTIONS Direct anterior approach   EXERCISES  Results after joint replacement surgery are often greatly improved when you follow the exercise, range of motion and muscle strengthening exercises prescribed by your doctor. Safety measures are also important to protect the joint from further injury. Any time any of these exercises cause you to have increased pain or swelling, decrease what you are doing until you are comfortable again and then slowly increase them. If you have problems or questions, call your caregiver or physical therapist for advice.   Rehabilitation is important following a joint replacement. After just a few days of immobilization, the muscles of the leg can become weakened and shrink (atrophy).  These exercises are designed to build up the tone and strength of the thigh and leg muscles and to improve motion. Often times heat used for twenty to thirty minutes before working out will loosen up your tissues and help with improving the range of motion but do not use heat for the first two weeks following surgery (sometimes heat can increase post-operative swelling).   These exercises can be done on a training (exercise) mat, on the floor, on a table or on a bed. Use whatever works the best and is most comfortable for you.    Use music or television while you are exercising so that the exercises are a pleasant break in your day. This will make your life better with  the exercises acting as a break in your routine that you can look forward to.   Perform all exercises about fifteen times, three times per day or as directed.  You should exercise both the operative leg and the other leg as well.  Exercises include:    Quad Sets - Tighten up the muscle on the front of the thigh (Quad) and hold for 5-10 seconds.    Straight Leg Raises - With your knee straight (if you were given a brace, keep it on), lift the leg to 60 degrees, hold for 3 seconds, and slowly lower  the leg.  Perform this exercise against resistance later as your leg gets stronger.   Leg Slides: Lying on your back, slowly slide your foot toward your buttocks, bending your knee up off the floor (only go as far as is comfortable). Then slowly slide your foot back down until your leg is flat on the floor again.   Angel Wings: Lying on your back spread your legs to the side as far apart as you can without causing discomfort.   Hamstring Strength:  Lying on your back, push your heel against the floor with your leg straight by tightening up the muscles of your buttocks.  Repeat, but this time bend your knee to a comfortable angle, and push your heel against the floor.  You may put a pillow under the heel to make it more comfortable if necessary.   A rehabilitation program following joint replacement surgery can speed recovery and prevent re-injury in the future due to weakened muscles. Contact your doctor or a physical therapist for more information on knee rehabilitation.    CONSTIPATION  Constipation is defined medically as fewer than three stools per week and severe constipation as less than one stool per week.  Even if you have a regular bowel pattern at home, your normal regimen is likely to be disrupted due to multiple reasons following surgery.  Combination of anesthesia, postoperative narcotics, change in appetite and fluid intake all can affect your bowels.   YOU MUST use at least one of the following options; they are listed in order of increasing strength to get the job done.  They are all available over the counter, and you may need to use some, POSSIBLY even all of these options:    Drink plenty of fluids (prune juice may be helpful) and high fiber foods Colace 100 mg by mouth twice a day  Senokot for constipation as directed and as needed Dulcolax (bisacodyl), take with full glass of water  Miralax (polyethylene glycol) once or twice a day as needed.  If you have tried all these  things and are unable to have a bowel movement in the first 3-4 days after surgery call either your surgeon or your primary doctor.    If you experience loose stools or diarrhea, hold the medications until you stool forms back up.  If your symptoms do not get better within 1 week or if they get worse, check with your doctor.  If you experience "the worst abdominal pain ever" or develop nausea or vomiting, please contact the office immediately for further recommendations for treatment.   ITCHING:  If you experience itching with your medications, try taking only a single pain pill, or even half a pain pill at a time.  You can also use Benadryl over the counter for itching or also to help with sleep.   TED HOSE STOCKINGS:  Use stockings on both legs until for  at least 2 weeks or as directed by physician office. They may be removed at night for sleeping.  MEDICATIONS:  See your medication summary on the After Visit Summary that nursing will review with you.  You may have some home medications which will be placed on hold until you complete the course of blood thinner medication.  It is important for you to complete the blood thinner medication as prescribed.  PRECAUTIONS:  If you experience chest pain or shortness of breath - call 911 immediately for transfer to the hospital emergency department.   If you develop a fever greater that 101 F, purulent drainage from wound, increased redness or drainage from wound, foul odor from the wound/dressing, or calf pain - CONTACT YOUR SURGEON.                                                   FOLLOW-UP APPOINTMENTS:  If you do not already have a post-op appointment, please call the office for an appointment to be seen by your surgeon.  Guidelines for how soon to be seen are listed in your After Visit Summary, but are typically between 1-4 weeks after surgery.  OTHER INSTRUCTIONS:   Knee Replacement:  Do not place pillow under knee, focus on keeping the knee  straight while resting. CPM instructions: 0-90 degrees, 2 hours in the morning, 2 hours in the afternoon, and 2 hours in the evening. Place foam block, curve side up under heel at all times except when in CPM or when walking.  DO NOT modify, tear, cut, or change the foam block in any way.  MAKE SURE YOU:   Understand these instructions.   Get help right away if you are not doing well or get worse.    Thank you for letting us be a part of your medical care team.  It is a privilege we respect greatly.  We hope these instructions will help you stay on track for a fast and full recovery!

## 2016-06-24 NOTE — Progress Notes (Signed)
Subjective: Doing well.  Pain controlled.  Feels better after transfusion.  Ready for transfer to snf   Objective: Vital signs in last 24 hours: Temp:  [98.8 F (37.1 C)-99.9 F (37.7 C)] 99.9 F (37.7 C) (06/07 0412) Pulse Rate:  [97-99] 97 (06/07 0412) Resp:  [18-20] 20 (06/07 0412) BP: (103-128)/(57-77) 128/75 (06/07 1025) SpO2:  [98 %] 98 % (06/07 0412)  Intake/Output from previous day: 06/06 0701 - 06/07 0700 In: 490 [P.O.:120; Blood:370] Out: 600 [Urine:600] Intake/Output this shift: Total I/O In: 120 [P.O.:120] Out: -    Recent Labs  06/22/16 0537 06/23/16 0538 06/23/16 1952 06/22/2016 0425  HGB 7.9* 7.8* 8.9* 8.7*    Recent Labs  06/23/16 0538 06/23/16 1952 07/06/2016 0425  WBC 12.1*  --  16.1*  RBC 3.17*  --  3.48*  HCT 25.9* 29.1* 28.1*  PLT 234  --  232    Recent Labs  06/22/16 0537  NA 138  K 3.6  CL 108  CO2 22  BUN 6  CREATININE 0.74  GLUCOSE 126*  CALCIUM 8.0*   No results for input(s): LABPT, INR in the last 72 hours.  Exam: Pleasant female, alert and oriented.  NAD.  bilat calves nontender.  NVI  Assessment/Plan: Transfer to snf today if bed available.  F/u with Lorin Mercy 2 weeks postop.    Benjiman Core 06/27/2016, 1:47 PM

## 2016-06-24 NOTE — Telephone Encounter (Signed)
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 CB # [phone]

## 2016-06-25 ENCOUNTER — Telehealth (INDEPENDENT_AMBULATORY_CARE_PROVIDER_SITE_OTHER): Payer: Self-pay | Admitting: Radiology

## 2016-06-25 DIAGNOSIS — Z471 Aftercare following joint replacement surgery: Secondary | ICD-10-CM | POA: Diagnosis not present

## 2016-06-25 DIAGNOSIS — E119 Type 2 diabetes mellitus without complications: Secondary | ICD-10-CM | POA: Diagnosis not present

## 2016-06-25 DIAGNOSIS — I10 Essential (primary) hypertension: Secondary | ICD-10-CM | POA: Diagnosis not present

## 2016-06-25 DIAGNOSIS — J449 Chronic obstructive pulmonary disease, unspecified: Secondary | ICD-10-CM | POA: Diagnosis not present

## 2016-06-25 DIAGNOSIS — M1611 Unilateral primary osteoarthritis, right hip: Secondary | ICD-10-CM | POA: Diagnosis not present

## 2016-06-25 NOTE — Telephone Encounter (Signed)
appt made for patient on 6/19 by Joycelyn Schmid.

## 2016-06-25 NOTE — Telephone Encounter (Signed)
[name] called to advise that the patient states that she is allergic to lovenox.  I looked in the chart did not see an allergy to this. However it is documented that she has an allergy to aspirin. I advised [name] that I would have to try to call [name] or [name] to advise.  Tried to [place] with no answer--will keep trying.-- after waiting for an hour to I asked [name] to help with this.

## 2016-06-25 NOTE — Telephone Encounter (Signed)
FYI, I know you told Betsy x 10 days, but I had already called facility, we will see patient on 07/06/16 and can d/c eliquis at that time if you advise.  Thanks.  Upon speaking with Benjiman Core, PA we discussed patient's history and meds list.  Patient took Eliquis after last total joint and Jeneen Rinks has approved 2.5 mg Eliquis BID x 3 weeks postop.  [place] [phone] and s/w Meka, and adivsed this.  I will update patient's chart to reflect her reported allergy to lovenox.

## 2016-06-28 ENCOUNTER — Telehealth (INDEPENDENT_AMBULATORY_CARE_PROVIDER_SITE_OTHER): Payer: Self-pay | Admitting: Radiology

## 2016-06-28 NOTE — Telephone Encounter (Signed)
[name] from [place] called to advise patient passed away unexpectedly on April 28, 2022.

## 2016-07-06 ENCOUNTER — Inpatient Hospital Stay (INDEPENDENT_AMBULATORY_CARE_PROVIDER_SITE_OTHER): Payer: Medicare Other | Admitting: Orthopaedic Surgery

## 2016-07-08 ENCOUNTER — Ambulatory Visit: Payer: Medicare Other | Admitting: Internal Medicine

## 2016-07-12 ENCOUNTER — Ambulatory Visit: Payer: Medicare Other | Admitting: Internal Medicine

## 2016-07-15 ENCOUNTER — Ambulatory Visit: Payer: Medicare Other | Admitting: Internal Medicine

## 2016-07-18 DIAGNOSIS — 419620001 Death: Secondary | SNOMED CT | POA: Diagnosis not present

## 2016-07-18 DEATH — deceased

## 2016-08-12 ENCOUNTER — Ambulatory Visit: Payer: Medicare Other

## 2016-08-23 ENCOUNTER — Other Ambulatory Visit (INDEPENDENT_AMBULATORY_CARE_PROVIDER_SITE_OTHER): Payer: Self-pay | Admitting: Orthopaedic Surgery

## 2017-11-10 IMAGING — DX DG PORTABLE PELVIS
1 series · 1 of 1 positions shown · non-contrast
Comparison: 04/27/2016

CLINICAL DATA: Postop right hip arthroplasty

EXAM:
PORTABLE PELVIS 1-2 VIEWS

[pelvis ap]
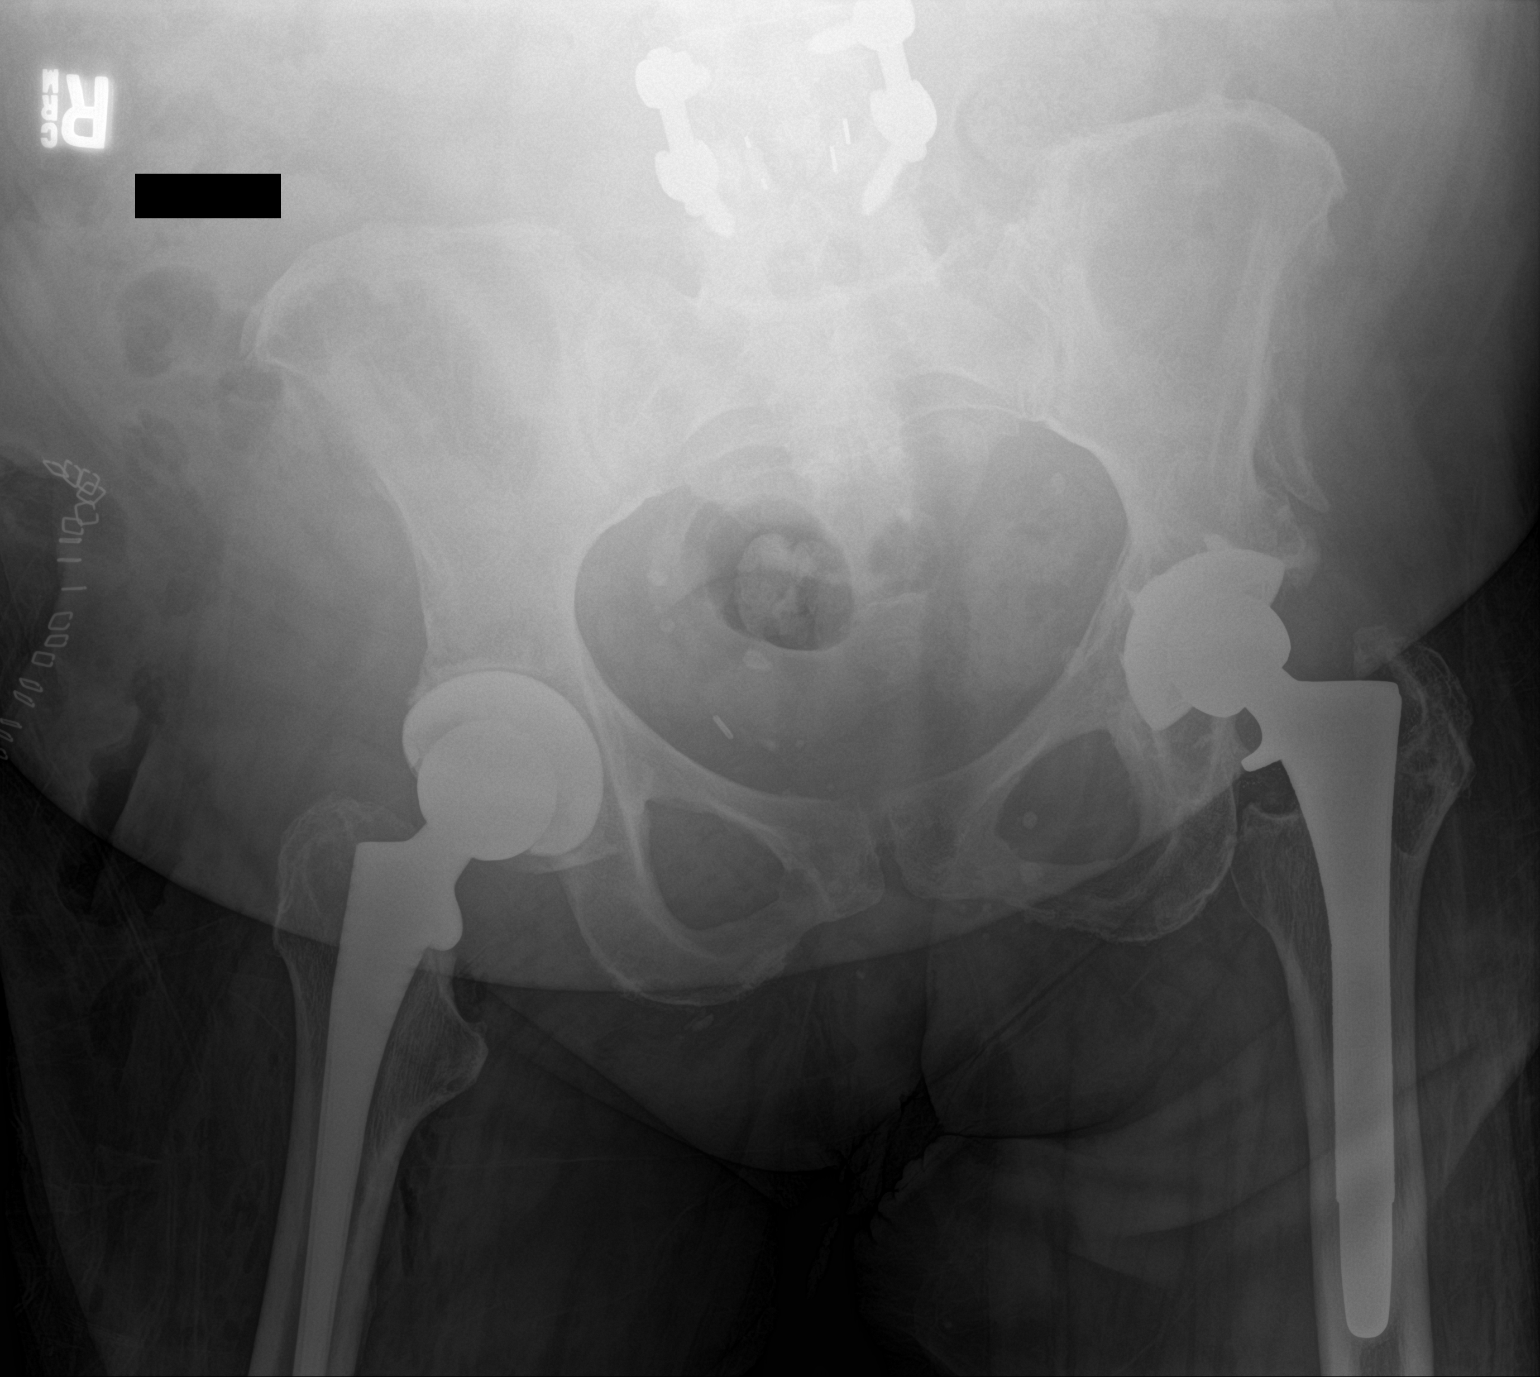

[1 of 1 positions shown; findings below may reference images not displayed]

FINDINGS: Remote changes of left hip replacement. New changes of right hip
total hip replacement. Normal AP alignment. No hardware or bony
complicating feature.
IMPRESSION: Interval right hip replacement.  No complicating feature.
